# Patient Record
Sex: Male | Born: 1960 | Race: White | Hispanic: No | Marital: Single | Smoking: Former smoker
Health system: Southern US, Community
[De-identification: ages and names within clinical notes are randomized; demographics above are authoritative.]

## PROBLEM LIST (undated history)

## (undated) ENCOUNTER — Emergency Department (HOSPITAL_BASED_OUTPATIENT_CLINIC_OR_DEPARTMENT_OTHER): Admission: EM | Source: Home / Self Care

## (undated) DIAGNOSIS — B20 Human immunodeficiency virus [HIV] disease: Secondary | ICD-10-CM

## (undated) DIAGNOSIS — C8590 Non-Hodgkin lymphoma, unspecified, unspecified site: Secondary | ICD-10-CM

## (undated) DIAGNOSIS — F419 Anxiety disorder, unspecified: Secondary | ICD-10-CM

## (undated) DIAGNOSIS — R51 Headache: Secondary | ICD-10-CM

## (undated) DIAGNOSIS — F329 Major depressive disorder, single episode, unspecified: Secondary | ICD-10-CM

## (undated) DIAGNOSIS — Z21 Asymptomatic human immunodeficiency virus [HIV] infection status: Secondary | ICD-10-CM

## (undated) DIAGNOSIS — F32A Depression, unspecified: Secondary | ICD-10-CM

## (undated) DIAGNOSIS — L0390 Cellulitis, unspecified: Secondary | ICD-10-CM

## (undated) HISTORY — DX: Non-Hodgkin lymphoma, unspecified, unspecified site: C85.90

## (undated) HISTORY — DX: Anxiety disorder, unspecified: F41.9

## (undated) HISTORY — PX: ARM HARDWARE REMOVAL: SUR1122

## (undated) HISTORY — PX: HEMORRHOID SURGERY: SHX153

## (undated) HISTORY — PX: OTHER SURGICAL HISTORY: SHX169

---

## 1995-05-21 ENCOUNTER — Encounter (INDEPENDENT_AMBULATORY_CARE_PROVIDER_SITE_OTHER): Payer: Self-pay | Admitting: *Deleted

## 1995-05-21 LAB — CONVERTED CEMR LAB
CD4 Count: 120 microliters
CD4 T Cell Abs: 120

## 1995-07-03 ENCOUNTER — Encounter: Payer: Self-pay | Admitting: Internal Medicine

## 1999-04-13 ENCOUNTER — Encounter (INDEPENDENT_AMBULATORY_CARE_PROVIDER_SITE_OTHER): Payer: Self-pay | Admitting: *Deleted

## 1999-04-13 ENCOUNTER — Ambulatory Visit (HOSPITAL_BASED_OUTPATIENT_CLINIC_OR_DEPARTMENT_OTHER): Admission: RE | Admit: 1999-04-13 | Discharge: 1999-04-13 | Payer: Self-pay | Admitting: General Surgery

## 1999-05-10 ENCOUNTER — Ambulatory Visit (HOSPITAL_COMMUNITY): Admission: RE | Admit: 1999-05-10 | Discharge: 1999-05-10 | Payer: Self-pay | Admitting: Internal Medicine

## 1999-05-10 ENCOUNTER — Encounter: Admission: RE | Admit: 1999-05-10 | Discharge: 1999-05-10 | Payer: Self-pay | Admitting: Internal Medicine

## 1999-05-25 ENCOUNTER — Encounter: Admission: RE | Admit: 1999-05-25 | Discharge: 1999-05-25 | Payer: Self-pay | Admitting: Internal Medicine

## 2000-03-29 ENCOUNTER — Encounter: Admission: RE | Admit: 2000-03-29 | Discharge: 2000-03-29 | Payer: Self-pay | Admitting: Internal Medicine

## 2000-03-29 ENCOUNTER — Ambulatory Visit (HOSPITAL_COMMUNITY): Admission: RE | Admit: 2000-03-29 | Discharge: 2000-03-29 | Payer: Self-pay | Admitting: Internal Medicine

## 2000-04-12 ENCOUNTER — Encounter: Admission: RE | Admit: 2000-04-12 | Discharge: 2000-04-12 | Payer: Self-pay | Admitting: Internal Medicine

## 2000-04-14 ENCOUNTER — Encounter: Admission: RE | Admit: 2000-04-14 | Discharge: 2000-04-14 | Payer: Self-pay | Admitting: Internal Medicine

## 2000-04-17 ENCOUNTER — Encounter (HOSPITAL_COMMUNITY): Admission: RE | Admit: 2000-04-17 | Discharge: 2000-07-16 | Payer: Self-pay | Admitting: Dentistry

## 2000-04-18 ENCOUNTER — Encounter (HOSPITAL_COMMUNITY): Payer: Self-pay | Admitting: Dentistry

## 2000-06-21 ENCOUNTER — Ambulatory Visit (HOSPITAL_COMMUNITY): Admission: RE | Admit: 2000-06-21 | Discharge: 2000-06-21 | Payer: Self-pay | Admitting: Internal Medicine

## 2000-06-21 ENCOUNTER — Encounter: Admission: RE | Admit: 2000-06-21 | Discharge: 2000-06-21 | Payer: Self-pay | Admitting: Internal Medicine

## 2000-07-06 ENCOUNTER — Encounter: Admission: RE | Admit: 2000-07-06 | Discharge: 2000-07-06 | Payer: Self-pay | Admitting: Internal Medicine

## 2000-08-18 ENCOUNTER — Ambulatory Visit (HOSPITAL_COMMUNITY): Admission: RE | Admit: 2000-08-18 | Discharge: 2000-08-18 | Payer: Self-pay | Admitting: Internal Medicine

## 2000-08-18 ENCOUNTER — Encounter: Admission: RE | Admit: 2000-08-18 | Discharge: 2000-08-18 | Payer: Self-pay | Admitting: Internal Medicine

## 2000-09-04 ENCOUNTER — Encounter: Admission: RE | Admit: 2000-09-04 | Discharge: 2000-09-04 | Payer: Self-pay | Admitting: Internal Medicine

## 2000-11-07 ENCOUNTER — Encounter: Admission: RE | Admit: 2000-11-07 | Discharge: 2000-11-07 | Payer: Self-pay | Admitting: Internal Medicine

## 2000-11-07 ENCOUNTER — Ambulatory Visit (HOSPITAL_COMMUNITY): Admission: RE | Admit: 2000-11-07 | Discharge: 2000-11-07 | Payer: Self-pay | Admitting: Internal Medicine

## 2000-11-21 ENCOUNTER — Encounter: Admission: RE | Admit: 2000-11-21 | Discharge: 2000-11-21 | Payer: Self-pay | Admitting: Internal Medicine

## 2000-12-19 ENCOUNTER — Encounter: Admission: RE | Admit: 2000-12-19 | Discharge: 2000-12-19 | Payer: Self-pay | Admitting: Internal Medicine

## 2001-02-06 ENCOUNTER — Ambulatory Visit (HOSPITAL_COMMUNITY): Admission: RE | Admit: 2001-02-06 | Discharge: 2001-02-06 | Payer: Self-pay | Admitting: Internal Medicine

## 2001-02-06 ENCOUNTER — Encounter: Admission: RE | Admit: 2001-02-06 | Discharge: 2001-02-06 | Payer: Self-pay | Admitting: Internal Medicine

## 2001-02-20 ENCOUNTER — Encounter: Admission: RE | Admit: 2001-02-20 | Discharge: 2001-02-20 | Payer: Self-pay | Admitting: Internal Medicine

## 2001-04-03 ENCOUNTER — Ambulatory Visit (HOSPITAL_COMMUNITY): Admission: RE | Admit: 2001-04-03 | Discharge: 2001-04-03 | Payer: Self-pay | Admitting: Internal Medicine

## 2001-04-03 ENCOUNTER — Encounter: Admission: RE | Admit: 2001-04-03 | Discharge: 2001-04-03 | Payer: Self-pay | Admitting: Internal Medicine

## 2001-04-17 ENCOUNTER — Encounter: Admission: RE | Admit: 2001-04-17 | Discharge: 2001-04-17 | Payer: Self-pay | Admitting: Internal Medicine

## 2001-06-25 ENCOUNTER — Ambulatory Visit (HOSPITAL_COMMUNITY): Admission: RE | Admit: 2001-06-25 | Discharge: 2001-06-25 | Payer: Self-pay | Admitting: Internal Medicine

## 2001-06-25 ENCOUNTER — Encounter: Admission: RE | Admit: 2001-06-25 | Discharge: 2001-06-25 | Payer: Self-pay | Admitting: Internal Medicine

## 2001-07-09 ENCOUNTER — Encounter: Admission: RE | Admit: 2001-07-09 | Discharge: 2001-07-09 | Payer: Self-pay | Admitting: Internal Medicine

## 2001-10-02 ENCOUNTER — Ambulatory Visit: Admission: RE | Admit: 2001-10-02 | Discharge: 2001-10-02 | Payer: Self-pay | Admitting: Internal Medicine

## 2001-10-02 ENCOUNTER — Encounter: Admission: RE | Admit: 2001-10-02 | Discharge: 2001-10-02 | Payer: Self-pay | Admitting: Internal Medicine

## 2001-10-09 ENCOUNTER — Encounter: Admission: RE | Admit: 2001-10-09 | Discharge: 2001-10-09 | Payer: Self-pay | Admitting: Internal Medicine

## 2001-12-04 ENCOUNTER — Encounter: Admission: RE | Admit: 2001-12-04 | Discharge: 2001-12-04 | Payer: Self-pay | Admitting: Internal Medicine

## 2002-01-22 ENCOUNTER — Encounter: Admission: RE | Admit: 2002-01-22 | Discharge: 2002-01-22 | Payer: Self-pay | Admitting: Internal Medicine

## 2002-01-22 ENCOUNTER — Ambulatory Visit (HOSPITAL_COMMUNITY): Admission: RE | Admit: 2002-01-22 | Discharge: 2002-01-22 | Payer: Self-pay | Admitting: Internal Medicine

## 2002-02-05 ENCOUNTER — Encounter: Admission: RE | Admit: 2002-02-05 | Discharge: 2002-02-05 | Payer: Self-pay | Admitting: Internal Medicine

## 2002-03-11 ENCOUNTER — Encounter: Admission: RE | Admit: 2002-03-11 | Discharge: 2002-03-11 | Payer: Self-pay | Admitting: Internal Medicine

## 2002-03-28 ENCOUNTER — Encounter: Admission: RE | Admit: 2002-03-28 | Discharge: 2002-03-28 | Payer: Self-pay | Admitting: Internal Medicine

## 2002-04-09 ENCOUNTER — Encounter: Admission: RE | Admit: 2002-04-09 | Discharge: 2002-04-09 | Payer: Self-pay | Admitting: Internal Medicine

## 2002-05-28 ENCOUNTER — Encounter: Admission: RE | Admit: 2002-05-28 | Discharge: 2002-05-28 | Payer: Self-pay | Admitting: Internal Medicine

## 2002-07-09 ENCOUNTER — Ambulatory Visit (HOSPITAL_COMMUNITY): Admission: RE | Admit: 2002-07-09 | Discharge: 2002-07-09 | Payer: Self-pay | Admitting: Internal Medicine

## 2002-07-09 ENCOUNTER — Encounter: Admission: RE | Admit: 2002-07-09 | Discharge: 2002-07-09 | Payer: Self-pay | Admitting: Internal Medicine

## 2002-08-13 ENCOUNTER — Encounter: Admission: RE | Admit: 2002-08-13 | Discharge: 2002-08-13 | Payer: Self-pay | Admitting: Internal Medicine

## 2002-09-03 ENCOUNTER — Encounter: Admission: RE | Admit: 2002-09-03 | Discharge: 2002-09-03 | Payer: Self-pay | Admitting: Internal Medicine

## 2002-11-18 ENCOUNTER — Encounter: Payer: Self-pay | Admitting: Internal Medicine

## 2002-11-18 ENCOUNTER — Encounter: Admission: RE | Admit: 2002-11-18 | Discharge: 2002-11-18 | Payer: Self-pay | Admitting: Internal Medicine

## 2002-12-03 ENCOUNTER — Encounter: Admission: RE | Admit: 2002-12-03 | Discharge: 2002-12-03 | Payer: Self-pay | Admitting: Internal Medicine

## 2003-02-18 ENCOUNTER — Ambulatory Visit (HOSPITAL_COMMUNITY): Admission: RE | Admit: 2003-02-18 | Discharge: 2003-02-18 | Payer: Self-pay | Admitting: Internal Medicine

## 2003-02-18 ENCOUNTER — Encounter: Payer: Self-pay | Admitting: Internal Medicine

## 2003-02-18 ENCOUNTER — Encounter: Admission: RE | Admit: 2003-02-18 | Discharge: 2003-02-18 | Payer: Self-pay | Admitting: Internal Medicine

## 2003-03-06 ENCOUNTER — Encounter: Admission: RE | Admit: 2003-03-06 | Discharge: 2003-03-06 | Payer: Self-pay | Admitting: Internal Medicine

## 2003-04-08 ENCOUNTER — Encounter: Admission: RE | Admit: 2003-04-08 | Discharge: 2003-04-08 | Payer: Self-pay | Admitting: Internal Medicine

## 2003-05-27 ENCOUNTER — Encounter: Payer: Self-pay | Admitting: Internal Medicine

## 2003-05-27 ENCOUNTER — Ambulatory Visit (HOSPITAL_COMMUNITY): Admission: RE | Admit: 2003-05-27 | Discharge: 2003-05-27 | Payer: Self-pay | Admitting: Internal Medicine

## 2003-05-27 ENCOUNTER — Encounter: Admission: RE | Admit: 2003-05-27 | Discharge: 2003-05-27 | Payer: Self-pay | Admitting: Internal Medicine

## 2003-06-10 ENCOUNTER — Encounter: Admission: RE | Admit: 2003-06-10 | Discharge: 2003-06-10 | Payer: Self-pay | Admitting: Internal Medicine

## 2003-08-26 ENCOUNTER — Ambulatory Visit (HOSPITAL_COMMUNITY): Admission: RE | Admit: 2003-08-26 | Discharge: 2003-08-26 | Payer: Self-pay | Admitting: Internal Medicine

## 2003-08-26 ENCOUNTER — Encounter: Admission: RE | Admit: 2003-08-26 | Discharge: 2003-08-26 | Payer: Self-pay | Admitting: Internal Medicine

## 2003-09-09 ENCOUNTER — Encounter: Admission: RE | Admit: 2003-09-09 | Discharge: 2003-09-09 | Payer: Self-pay | Admitting: Internal Medicine

## 2003-12-25 ENCOUNTER — Encounter: Admission: RE | Admit: 2003-12-25 | Discharge: 2003-12-25 | Payer: Self-pay | Admitting: Internal Medicine

## 2004-02-05 ENCOUNTER — Encounter: Admission: RE | Admit: 2004-02-05 | Discharge: 2004-02-05 | Payer: Self-pay | Admitting: Internal Medicine

## 2004-02-05 ENCOUNTER — Ambulatory Visit (HOSPITAL_COMMUNITY): Admission: RE | Admit: 2004-02-05 | Discharge: 2004-02-05 | Payer: Self-pay | Admitting: Internal Medicine

## 2004-02-19 ENCOUNTER — Ambulatory Visit: Payer: Self-pay | Admitting: Internal Medicine

## 2004-03-16 ENCOUNTER — Ambulatory Visit: Payer: Self-pay | Admitting: Internal Medicine

## 2004-05-04 ENCOUNTER — Ambulatory Visit (HOSPITAL_COMMUNITY): Admission: RE | Admit: 2004-05-04 | Discharge: 2004-05-04 | Payer: Self-pay | Admitting: Internal Medicine

## 2004-05-04 ENCOUNTER — Ambulatory Visit: Payer: Self-pay | Admitting: Internal Medicine

## 2004-05-18 ENCOUNTER — Ambulatory Visit: Payer: Self-pay | Admitting: Internal Medicine

## 2004-07-26 ENCOUNTER — Ambulatory Visit: Payer: Self-pay | Admitting: Internal Medicine

## 2004-07-26 ENCOUNTER — Ambulatory Visit (HOSPITAL_COMMUNITY): Admission: RE | Admit: 2004-07-26 | Discharge: 2004-07-26 | Payer: Self-pay | Admitting: Internal Medicine

## 2004-08-10 ENCOUNTER — Ambulatory Visit: Payer: Self-pay | Admitting: Internal Medicine

## 2004-08-26 ENCOUNTER — Ambulatory Visit: Payer: Self-pay | Admitting: Internal Medicine

## 2004-11-04 ENCOUNTER — Ambulatory Visit (HOSPITAL_COMMUNITY): Admission: RE | Admit: 2004-11-04 | Discharge: 2004-11-04 | Payer: Self-pay | Admitting: Internal Medicine

## 2004-11-04 ENCOUNTER — Ambulatory Visit: Payer: Self-pay | Admitting: Internal Medicine

## 2004-11-25 ENCOUNTER — Ambulatory Visit: Payer: Self-pay | Admitting: Internal Medicine

## 2005-02-09 ENCOUNTER — Ambulatory Visit: Payer: Self-pay | Admitting: Internal Medicine

## 2005-02-09 ENCOUNTER — Ambulatory Visit (HOSPITAL_COMMUNITY): Admission: RE | Admit: 2005-02-09 | Discharge: 2005-02-09 | Payer: Self-pay | Admitting: Internal Medicine

## 2005-02-09 ENCOUNTER — Encounter (INDEPENDENT_AMBULATORY_CARE_PROVIDER_SITE_OTHER): Payer: Self-pay | Admitting: *Deleted

## 2005-02-09 LAB — CONVERTED CEMR LAB
CD4 Count: 430 microliters
HIV 1 RNA Quant: 399 copies/mL

## 2005-03-01 ENCOUNTER — Ambulatory Visit: Payer: Self-pay | Admitting: Internal Medicine

## 2005-05-10 ENCOUNTER — Ambulatory Visit: Payer: Self-pay | Admitting: Internal Medicine

## 2005-09-06 ENCOUNTER — Ambulatory Visit: Payer: Self-pay | Admitting: Internal Medicine

## 2005-09-06 ENCOUNTER — Encounter: Admission: RE | Admit: 2005-09-06 | Discharge: 2005-09-06 | Payer: Self-pay | Admitting: Internal Medicine

## 2005-09-06 ENCOUNTER — Encounter (INDEPENDENT_AMBULATORY_CARE_PROVIDER_SITE_OTHER): Payer: Self-pay | Admitting: *Deleted

## 2005-09-06 LAB — CONVERTED CEMR LAB
CD4 Count: 300 microliters
HIV 1 RNA Quant: 399 copies/mL

## 2005-09-20 ENCOUNTER — Ambulatory Visit: Payer: Self-pay | Admitting: Internal Medicine

## 2006-03-08 ENCOUNTER — Encounter (INDEPENDENT_AMBULATORY_CARE_PROVIDER_SITE_OTHER): Payer: Self-pay | Admitting: *Deleted

## 2006-03-08 ENCOUNTER — Encounter: Admission: RE | Admit: 2006-03-08 | Discharge: 2006-03-08 | Payer: Self-pay | Admitting: Internal Medicine

## 2006-03-08 ENCOUNTER — Ambulatory Visit: Payer: Self-pay | Admitting: Internal Medicine

## 2006-03-08 LAB — CONVERTED CEMR LAB
CD4 Count: 430 microliters
HIV 1 RNA Quant: 140 copies/mL

## 2006-03-28 ENCOUNTER — Ambulatory Visit: Payer: Self-pay | Admitting: Internal Medicine

## 2006-04-06 DIAGNOSIS — K602 Anal fissure, unspecified: Secondary | ICD-10-CM

## 2006-04-06 DIAGNOSIS — F411 Generalized anxiety disorder: Secondary | ICD-10-CM | POA: Insufficient documentation

## 2006-04-06 DIAGNOSIS — B3781 Candidal esophagitis: Secondary | ICD-10-CM | POA: Insufficient documentation

## 2006-04-06 DIAGNOSIS — B1920 Unspecified viral hepatitis C without hepatic coma: Secondary | ICD-10-CM

## 2006-04-06 DIAGNOSIS — B20 Human immunodeficiency virus [HIV] disease: Secondary | ICD-10-CM | POA: Insufficient documentation

## 2006-04-06 DIAGNOSIS — F329 Major depressive disorder, single episode, unspecified: Secondary | ICD-10-CM

## 2006-04-06 DIAGNOSIS — F1910 Other psychoactive substance abuse, uncomplicated: Secondary | ICD-10-CM

## 2006-04-06 DIAGNOSIS — K029 Dental caries, unspecified: Secondary | ICD-10-CM | POA: Insufficient documentation

## 2006-08-14 ENCOUNTER — Encounter (INDEPENDENT_AMBULATORY_CARE_PROVIDER_SITE_OTHER): Payer: Self-pay | Admitting: *Deleted

## 2006-08-14 LAB — CONVERTED CEMR LAB

## 2006-08-27 ENCOUNTER — Encounter (INDEPENDENT_AMBULATORY_CARE_PROVIDER_SITE_OTHER): Payer: Self-pay | Admitting: *Deleted

## 2006-09-25 ENCOUNTER — Telehealth: Payer: Self-pay | Admitting: Internal Medicine

## 2006-10-17 ENCOUNTER — Telehealth: Payer: Self-pay | Admitting: Internal Medicine

## 2006-11-30 ENCOUNTER — Telehealth: Payer: Self-pay | Admitting: Internal Medicine

## 2006-12-11 ENCOUNTER — Telehealth: Payer: Self-pay | Admitting: Internal Medicine

## 2006-12-19 ENCOUNTER — Ambulatory Visit: Payer: Self-pay | Admitting: Internal Medicine

## 2006-12-19 ENCOUNTER — Encounter: Admission: RE | Admit: 2006-12-19 | Discharge: 2006-12-19 | Payer: Self-pay | Admitting: Internal Medicine

## 2006-12-19 LAB — CONVERTED CEMR LAB
ALT: 12 units/L (ref 0–53)
AST: 15 units/L (ref 0–37)
Albumin: 4.4 g/dL (ref 3.5–5.2)
Alkaline Phosphatase: 82 units/L (ref 39–117)
BUN: 21 mg/dL (ref 6–23)
Basophils Absolute: 0 10*3/uL (ref 0.0–0.1)
Basophils Relative: 0 % (ref 0–1)
CO2: 26 meq/L (ref 19–32)
Calcium: 9.1 mg/dL (ref 8.4–10.5)
Chloride: 106 meq/L (ref 96–112)
Cholesterol: 170 mg/dL (ref 0–200)
Creatinine, Ser: 0.93 mg/dL (ref 0.40–1.50)
Eosinophils Absolute: 0.1 10*3/uL (ref 0.0–0.7)
Eosinophils Relative: 1 % (ref 0–5)
Glucose, Bld: 95 mg/dL (ref 70–99)
HCT: 43.9 % (ref 39.0–52.0)
HDL: 30 mg/dL — ABNORMAL LOW (ref >39)
HIV 1 RNA Quant: 134 copies/mL — ABNORMAL HIGH (ref <50)
HIV-1 RNA Quant, Log: 2.13 — ABNORMAL HIGH (ref <1.70)
Hemoglobin: 14.9 g/dL (ref 13.0–17.0)
LDL Cholesterol: 105 mg/dL — ABNORMAL HIGH (ref 0–99)
Lymphocytes Relative: 23 % (ref 12–46)
Lymphs Abs: 1.6 10*3/uL (ref 0.7–3.3)
MCHC: 33.9 g/dL (ref 30.0–36.0)
MCV: 117.1 fL — ABNORMAL HIGH (ref 78.0–100.0)
Monocytes Absolute: 0.6 10*3/uL (ref 0.2–0.7)
Monocytes Relative: 9 % (ref 3–11)
Neutro Abs: 4.6 10*3/uL (ref 1.7–7.7)
Neutrophils Relative %: 67 % (ref 43–77)
Platelets: 173 10*3/uL (ref 150–400)
Potassium: 4.3 meq/L (ref 3.5–5.3)
RBC: 3.75 M/uL — ABNORMAL LOW (ref 4.22–5.81)
RDW: 14.2 % — ABNORMAL HIGH (ref 11.5–14.0)
Sodium: 138 meq/L (ref 135–145)
Total Bilirubin: 0.6 mg/dL (ref 0.3–1.2)
Total CHOL/HDL Ratio: 5.7
Total Protein: 7.2 g/dL (ref 6.0–8.3)
Triglycerides: 173 mg/dL — ABNORMAL HIGH (ref <150)
VLDL: 35 mg/dL (ref 0–40)
WBC: 6.8 10*3/uL (ref 4.0–10.5)

## 2006-12-20 ENCOUNTER — Telehealth: Payer: Self-pay | Admitting: Internal Medicine

## 2006-12-20 ENCOUNTER — Encounter: Payer: Self-pay | Admitting: Internal Medicine

## 2006-12-20 LAB — CONVERTED CEMR LAB: CD4 Count: 330 microliters

## 2007-01-02 ENCOUNTER — Ambulatory Visit: Payer: Self-pay | Admitting: Internal Medicine

## 2007-01-04 ENCOUNTER — Encounter: Payer: Self-pay | Admitting: Internal Medicine

## 2007-01-04 ENCOUNTER — Telehealth: Payer: Self-pay | Admitting: Internal Medicine

## 2007-06-07 ENCOUNTER — Telehealth: Payer: Self-pay | Admitting: Internal Medicine

## 2007-06-08 ENCOUNTER — Ambulatory Visit: Payer: Self-pay | Admitting: Internal Medicine

## 2007-06-08 ENCOUNTER — Encounter: Admission: RE | Admit: 2007-06-08 | Discharge: 2007-06-08 | Payer: Self-pay | Admitting: Internal Medicine

## 2007-06-08 LAB — CONVERTED CEMR LAB
HIV 1 RNA Quant: 74 copies/mL — ABNORMAL HIGH (ref <50)
HIV-1 RNA Quant, Log: 1.87 — ABNORMAL HIGH (ref <1.70)

## 2007-07-02 ENCOUNTER — Telehealth: Payer: Self-pay | Admitting: Internal Medicine

## 2007-07-10 ENCOUNTER — Ambulatory Visit: Payer: Self-pay | Admitting: Internal Medicine

## 2007-08-07 ENCOUNTER — Telehealth: Payer: Self-pay | Admitting: Infectious Diseases

## 2007-09-05 ENCOUNTER — Telehealth: Payer: Self-pay | Admitting: Internal Medicine

## 2007-10-18 ENCOUNTER — Telehealth: Payer: Self-pay | Admitting: Internal Medicine

## 2007-11-05 ENCOUNTER — Encounter: Payer: Self-pay | Admitting: Internal Medicine

## 2007-12-27 ENCOUNTER — Encounter: Admission: RE | Admit: 2007-12-27 | Discharge: 2007-12-27 | Payer: Self-pay | Admitting: Internal Medicine

## 2007-12-27 ENCOUNTER — Ambulatory Visit: Payer: Self-pay | Admitting: Internal Medicine

## 2007-12-27 LAB — CONVERTED CEMR LAB: HIV 1 RNA Quant: 107 copies/mL — ABNORMAL HIGH (ref <50)

## 2008-01-15 ENCOUNTER — Ambulatory Visit: Payer: Self-pay | Admitting: Internal Medicine

## 2008-01-15 LAB — CONVERTED CEMR LAB
LDL Cholesterol: 132 mg/dL — ABNORMAL HIGH (ref 0–99)
Triglycerides: 228 mg/dL — ABNORMAL HIGH (ref <150)

## 2008-03-14 ENCOUNTER — Encounter (INDEPENDENT_AMBULATORY_CARE_PROVIDER_SITE_OTHER): Payer: Self-pay | Admitting: *Deleted

## 2008-07-10 ENCOUNTER — Ambulatory Visit: Payer: Self-pay | Admitting: Internal Medicine

## 2008-07-10 LAB — CONVERTED CEMR LAB
ALT: 16 units/L (ref 0–53)
Albumin: 4.4 g/dL (ref 3.5–5.2)
BUN: 19 mg/dL (ref 6–23)
CO2: 22 meq/L (ref 19–32)
Calcium: 9.2 mg/dL (ref 8.4–10.5)
Chloride: 107 meq/L (ref 96–112)
Cholesterol: 190 mg/dL (ref 0–200)
Creatinine, Ser: 0.87 mg/dL (ref 0.40–1.50)
HDL: 33 mg/dL — ABNORMAL LOW (ref >39)
HIV 1 RNA Quant: 98 copies/mL — ABNORMAL HIGH (ref <48)
Hemoglobin: 15.9 g/dL (ref 13.0–17.0)
Lymphocytes Relative: 37 % (ref 12–46)
Lymphs Abs: 2.2 10*3/uL (ref 0.7–4.0)
Monocytes Absolute: 0.5 10*3/uL (ref 0.1–1.0)
Monocytes Relative: 8 % (ref 3–12)
Neutro Abs: 3.2 10*3/uL (ref 1.7–7.7)
RBC: 4.42 M/uL (ref 4.22–5.81)
Total CHOL/HDL Ratio: 5.8
WBC: 6 10*3/uL (ref 4.0–10.5)

## 2008-07-29 ENCOUNTER — Ambulatory Visit: Payer: Self-pay | Admitting: Internal Medicine

## 2008-07-29 DIAGNOSIS — Z87891 Personal history of nicotine dependence: Secondary | ICD-10-CM | POA: Insufficient documentation

## 2008-08-11 ENCOUNTER — Telehealth: Payer: Self-pay | Admitting: Internal Medicine

## 2009-03-10 ENCOUNTER — Ambulatory Visit: Payer: Self-pay | Admitting: Infectious Disease

## 2009-03-10 ENCOUNTER — Encounter: Payer: Self-pay | Admitting: Internal Medicine

## 2009-03-10 LAB — CONVERTED CEMR LAB
AST: 25 units/L (ref 0–37)
Albumin: 4.2 g/dL (ref 3.5–5.2)
BUN: 14 mg/dL (ref 6–23)
Basophils Absolute: 0 10*3/uL (ref 0.0–0.1)
Basophils Relative: 0 % (ref 0–1)
Calcium: 9.1 mg/dL (ref 8.4–10.5)
Chloride: 105 meq/L (ref 96–112)
HIV 1 RNA Quant: 127000 copies/mL — ABNORMAL HIGH (ref <48)
HIV-1 RNA Quant, Log: 5.1 — ABNORMAL HIGH (ref <1.68)
Hemoglobin: 15.6 g/dL (ref 13.0–17.0)
Lymphocytes Relative: 40 % (ref 12–46)
MCHC: 34.6 g/dL (ref 30.0–36.0)
Monocytes Absolute: 0.5 10*3/uL (ref 0.1–1.0)
Neutro Abs: 2.5 10*3/uL (ref 1.7–7.7)
Neutrophils Relative %: 49 % (ref 43–77)
Platelets: 141 10*3/uL — ABNORMAL LOW (ref 150–400)
Potassium: 4.9 meq/L (ref 3.5–5.3)
RDW: 13 % (ref 11.5–15.5)
Sodium: 139 meq/L (ref 135–145)
Total Protein: 7.7 g/dL (ref 6.0–8.3)

## 2009-03-16 ENCOUNTER — Encounter: Payer: Self-pay | Admitting: Internal Medicine

## 2009-03-23 ENCOUNTER — Ambulatory Visit: Payer: Self-pay | Admitting: Internal Medicine

## 2009-03-24 ENCOUNTER — Encounter: Payer: Self-pay | Admitting: Internal Medicine

## 2009-04-02 ENCOUNTER — Telehealth (INDEPENDENT_AMBULATORY_CARE_PROVIDER_SITE_OTHER): Payer: Self-pay | Admitting: *Deleted

## 2009-04-15 ENCOUNTER — Telehealth (INDEPENDENT_AMBULATORY_CARE_PROVIDER_SITE_OTHER): Payer: Self-pay | Admitting: *Deleted

## 2009-05-21 ENCOUNTER — Telehealth: Payer: Self-pay | Admitting: Internal Medicine

## 2009-06-17 ENCOUNTER — Telehealth (INDEPENDENT_AMBULATORY_CARE_PROVIDER_SITE_OTHER): Payer: Self-pay | Admitting: *Deleted

## 2009-06-24 ENCOUNTER — Telehealth: Payer: Self-pay | Admitting: Internal Medicine

## 2009-07-14 ENCOUNTER — Ambulatory Visit: Payer: Self-pay | Admitting: Internal Medicine

## 2009-07-14 LAB — CONVERTED CEMR LAB
AST: 25 units/L (ref 0–37)
Albumin: 4.2 g/dL (ref 3.5–5.2)
Alkaline Phosphatase: 82 units/L (ref 39–117)
Basophils Absolute: 0 10*3/uL (ref 0.0–0.1)
Basophils Relative: 0 % (ref 0–1)
Glucose, Bld: 77 mg/dL (ref 70–99)
HDL: 28 mg/dL — ABNORMAL LOW (ref >39)
LDL Cholesterol: 76 mg/dL (ref 0–99)
Lymphocytes Relative: 35 % (ref 12–46)
MCHC: 34.6 g/dL (ref 30.0–36.0)
Monocytes Absolute: 0.6 10*3/uL (ref 0.1–1.0)
Neutro Abs: 2.4 10*3/uL (ref 1.7–7.7)
Neutrophils Relative %: 51 % (ref 43–77)
Platelets: 120 10*3/uL — ABNORMAL LOW (ref 150–400)
Potassium: 4.1 meq/L (ref 3.5–5.3)
RDW: 13.8 % (ref 11.5–15.5)
Sodium: 139 meq/L (ref 135–145)
Total Bilirubin: 0.4 mg/dL (ref 0.3–1.2)
Total Protein: 7.6 g/dL (ref 6.0–8.3)
Triglycerides: 225 mg/dL — ABNORMAL HIGH (ref <150)
VLDL: 45 mg/dL — ABNORMAL HIGH (ref 0–40)

## 2009-07-28 ENCOUNTER — Telehealth (INDEPENDENT_AMBULATORY_CARE_PROVIDER_SITE_OTHER): Payer: Self-pay | Admitting: Licensed Clinical Social Worker

## 2009-07-28 ENCOUNTER — Ambulatory Visit: Payer: Self-pay | Admitting: Internal Medicine

## 2009-07-28 DIAGNOSIS — S2239XA Fracture of one rib, unspecified side, initial encounter for closed fracture: Secondary | ICD-10-CM | POA: Insufficient documentation

## 2009-07-28 DIAGNOSIS — J069 Acute upper respiratory infection, unspecified: Secondary | ICD-10-CM | POA: Insufficient documentation

## 2009-09-29 ENCOUNTER — Encounter: Payer: Self-pay | Admitting: Internal Medicine

## 2009-11-17 ENCOUNTER — Telehealth: Payer: Self-pay | Admitting: Internal Medicine

## 2009-12-15 ENCOUNTER — Ambulatory Visit: Payer: Self-pay | Admitting: Internal Medicine

## 2009-12-15 LAB — CONVERTED CEMR LAB
Albumin: 4.1 g/dL (ref 3.5–5.2)
Alkaline Phosphatase: 62 units/L (ref 39–117)
BUN: 16 mg/dL (ref 6–23)
Basophils Absolute: 0 10*3/uL (ref 0.0–0.1)
Basophils Relative: 1 % (ref 0–1)
Creatinine, Ser: 0.9 mg/dL (ref 0.40–1.50)
Eosinophils Absolute: 0.1 10*3/uL (ref 0.0–0.7)
Eosinophils Relative: 2 % (ref 0–5)
Glucose, Bld: 80 mg/dL (ref 70–99)
HCT: 43.3 % (ref 39.0–52.0)
HDL: 25 mg/dL — ABNORMAL LOW (ref >39)
Hemoglobin: 15.4 g/dL (ref 13.0–17.0)
LDL Cholesterol: 93 mg/dL (ref 0–99)
MCHC: 35.6 g/dL (ref 30.0–36.0)
Monocytes Absolute: 0.4 10*3/uL (ref 0.1–1.0)
Platelets: 127 10*3/uL — ABNORMAL LOW (ref 150–400)
RDW: 13.3 % (ref 11.5–15.5)
Total Bilirubin: 0.4 mg/dL (ref 0.3–1.2)
Total CHOL/HDL Ratio: 6.2
Triglycerides: 183 mg/dL — ABNORMAL HIGH (ref <150)
VLDL: 37 mg/dL (ref 0–40)

## 2009-12-24 ENCOUNTER — Telehealth: Payer: Self-pay | Admitting: Internal Medicine

## 2009-12-30 ENCOUNTER — Telehealth (INDEPENDENT_AMBULATORY_CARE_PROVIDER_SITE_OTHER): Payer: Self-pay | Admitting: *Deleted

## 2009-12-31 ENCOUNTER — Ambulatory Visit: Payer: Self-pay | Admitting: Internal Medicine

## 2010-02-05 ENCOUNTER — Telehealth: Payer: Self-pay | Admitting: Internal Medicine

## 2010-02-10 ENCOUNTER — Encounter: Payer: Self-pay | Admitting: Internal Medicine

## 2010-03-03 ENCOUNTER — Ambulatory Visit: Payer: Self-pay | Admitting: Internal Medicine

## 2010-03-03 LAB — CONVERTED CEMR LAB: HIV-1 RNA Quant, Log: 2.74 — ABNORMAL HIGH (ref <1.30)

## 2010-03-16 ENCOUNTER — Ambulatory Visit: Payer: Self-pay | Admitting: Internal Medicine

## 2010-04-15 ENCOUNTER — Encounter: Payer: Self-pay | Admitting: Internal Medicine

## 2010-04-26 ENCOUNTER — Encounter: Payer: Self-pay | Admitting: Internal Medicine

## 2010-05-18 ENCOUNTER — Encounter (INDEPENDENT_AMBULATORY_CARE_PROVIDER_SITE_OTHER): Payer: Self-pay | Admitting: *Deleted

## 2010-06-23 ENCOUNTER — Ambulatory Visit: Admit: 2010-06-23 | Payer: Self-pay | Admitting: Internal Medicine

## 2010-07-06 ENCOUNTER — Ambulatory Visit: Admit: 2010-07-06 | Payer: Self-pay | Admitting: Internal Medicine

## 2010-07-12 ENCOUNTER — Encounter (INDEPENDENT_AMBULATORY_CARE_PROVIDER_SITE_OTHER): Payer: Self-pay | Admitting: *Deleted

## 2010-07-22 ENCOUNTER — Encounter: Payer: Self-pay | Admitting: Internal Medicine

## 2010-07-22 NOTE — Assessment & Plan Note (Signed)
Summary: 29month f/u + flu vaccine   CC:  follow-up visit.  History of Present Illness: Jordan Calderon is in for his routine visit. He has all of his medications and his pill boxes.  He has been working with Bjorn Loser, our treatment adherence counselor, and says that he is doing much better taking his medications.  He can recall only two missed doses since his last visit.  He is feeling much better both mentally and physically.  Preventive Screening-Counseling & Management  Alcohol-Tobacco     Alcohol drinks/day: occassionally     Alcohol type: vodka     Smoking Status: current     Smoking Cessation Counseling: 03/28/2006     Smoke Cessation Stage: contemplative     Packs/Day: 0.75     Year Quit: 06/2007     Pack years: 1, started 30 years ago     Passive Smoke Exposure: no  Caffeine-Diet-Exercise     Caffeine use/day: yes     Does Patient Exercise: no     Type of exercise: lifting weights     Exercise (avg: min/session): 30-60     Times/week: 3  Hep-HIV-STD-Contraception     HIV Risk: no     HIV Risk Counseling: not indicated-no HIV risk noted  Safety-Violence-Falls     Seat Belt Use: yes  Comments: declined condoms      Sexual History:  n/a.        Drug Use:  never.     Prior Medication List:  COMBIVIR 150-300 MG TABS (LAMIVUDINE-ZIDOVUDINE) Take 1 tablet by mouth two times a day VIREAD 300 MG TABS (TENOFOVIR DISOPROXIL FUMARATE) Take 1 tablet by mouth once a day REYATAZ 300 MG CAPS (ATAZANAVIR SULFATE) Take 1 capsule by mouth once a day NORVIR 100 MG TABS (RITONAVIR) Take 1 tablet by mouth once a day PAXIL 10 MG TABS (PAROXETINE HCL) Take 1 tablet by mouth once a day ATIVAN 2 MG TABS (LORAZEPAM) Take 1-2 tablets by mouth at bedtime as needed for anxiety NAPROSYN 250 MG TABS (NAPROXEN) Take 1 tablet by mouth two times a day as needed for pain DAPSONE 100 MG TABS (DAPSONE) Take 1 tablet by mouth once a day   Current Allergies (reviewed today): ! SULFA Social History: Sexual  History:  n/a Drug Use:  never  Vital Signs:  Patient profile:   50 year old male Height:      68 inches (172.72 cm) Weight:      165.5 pounds (75.23 kg) BMI:     25.26 Temp:     98.2 degrees F (36.78 degrees C) oral Pulse rate:   86 / minute BP sitting:   149 / 92  (left arm) Cuff size:   regular  Vitals Entered By: Jennet Maduro RN (March 16, 2010 10:03 AM) CC: follow-up visit Is Patient Diabetic? No Pain Assessment Patient in pain? no      Nutritional Status BMI of 19 -24 = normal Nutritional Status Detail appetite "good"  Have you ever been in a relationship where you felt threatened, hurt or afraid?No   Does patient need assistance? Functional Status Self care Ambulation Normal Comments missed one dose of rxes   Physical Exam  General:  alert and well-nourished.   Mouth:  good dentition, pharynx pink and moist, no erythema, and no exudates.   Lungs:  normal breath sounds.  no crackles and no wheezes.  mild diffuse chest wall pain in the left axillary line. Heart:  normal rate, regular rhythm, and no murmur.  Skin:  no rashes.   Axillary Nodes:  no R axillary adenopathy and no L axillary adenopathy.   Psych:  normally interactive, good eye contact, not anxious appearing, and not depressed appearing.  he is in good spirits.        Medication Adherence: 03/16/2010   Adherence to medications reviewed with patient. Counseling to provide adequate adherence provided                                Impression & Recommendations:  Problem # 1:  HIV DISEASE (ICD-042) His adherence has improved dramatically and as a result his infection is coming back under control.  I will continue his current regimen. Diagnostics Reviewed:  HIV: CDC-defined AIDS (07/29/2008)   CD4: 260 (03/04/2010)   WBC: 4.0 (12/15/2009)   Hgb: 15.4 (12/15/2009)   HCT: 43.3 (12/15/2009)   Platelets: 127 (12/15/2009) HIV genotype: * (03/16/2009)   HIV-1 RNA: 546 (03/03/2010)   HBSAg:  No (08/14/2006)  Problem # 2:  ANXIETY (ICD-300.00) His anxiety has improved.  I will continue Ativan at bedtime. His updated medication list for this problem includes:    Paxil 10 Mg Tabs (Paroxetine hcl) .Marland Kitchen... Take 1 tablet by mouth once a day    Ativan 2 Mg Tabs (Lorazepam) .Marland Kitchen... Take 1-2 tablets by mouth at bedtime as needed for anxiety  Orders: Est. Patient Level IV (81191)  Problem # 3:  DEPRESSION (ICD-311) His depression is improving. His updated medication list for this problem includes:    Paxil 10 Mg Tabs (Paroxetine hcl) .Marland Kitchen... Take 1 tablet by mouth once a day    Ativan 2 Mg Tabs (Lorazepam) .Marland Kitchen... Take 1-2 tablets by mouth at bedtime as needed for anxiety  Orders: Est. Patient Level IV (47829)  Other Orders: Influenza Vaccine MCR (56213) Future Orders: T-CD4SP ([facility]) (CD4SP) ... 06/14/2010 T-HIV Viral Load [id number]) ... 06/14/2010 T-Comprehensive Metabolic Panel [phone]) ... 06/14/2010 T-CBC w/Diff ([id number]) ... 06/14/2010 T-RPR (Syphilis) [phone]) ... 06/14/2010 T-Lipid Profile [phone]) ... 06/14/2010  Patient Instructions: 1)  Please schedule a follow-up appointment in 3 months.  Prescriptions: ATIVAN 2 MG TABS (LORAZEPAM) Take 1-2 tablets by mouth at bedtime as needed for anxiety  #60 x 2   Entered and Authorized by:   [name] MD   Signed by:   [name] MD on 03/16/2010   Method used:   Print then Give to Patient   RxID:   [id number] ATIVAN 2 MG TABS (LORAZEPAM) Take 1-2 tablets by mouth at bedtime as needed for anxiety  #60 x 2   Entered and Authorized by:   [name] MD   Signed by:   [name] MD on 03/16/2010   Method used:   Print then Give to Patient   RxID:   [id number]    Influenza Vaccine    Vaccine Type: Fluvax MCR    Site: right deltoid    Mfr: novartis    Dose: 0.5 ml    Route: IM    Given by: Jennet Maduro RN    Exp. Date: 09/19/2010    Lot #: [id number]  Flu Vaccine Consent  Questions    Do you have a history of severe allergic reactions to this vaccine? no    Any prior history of allergic reactions to egg and/or gelatin? no    Do you have a sensitivity to the preservative Thimersol? no    Do you have a past history of  Guillan-Barre Syndrome? no    Do you currently have an acute febrile illness? no    Have you ever had a severe reaction to latex? no    Vaccine information given and explained to patient? yes   Process Orders Check Orders Results:     [facility]: Order checked:     [id number] -- T-Lipid Profile -- ABN required due to diagnosis (CPT: 80061) Tests Sent for requisitioning (March 16, 2010 10:32 AM):     06/14/2010: [facility] -- T-HIV Viral Load [phone] (signed)     06/14/2010: [facility] -- T-Comprehensive Metabolic Panel [[id number]] (signed)     06/14/2010: [facility] -- T-CBC w/Diff [[id number]] (signed)     06/14/2010: [facility] -- T-RPR (Syphilis) [phone] (signed)     06/14/2010: [facility] -- T-Lipid Profile [phone] (signed)

## 2010-07-22 NOTE — Progress Notes (Signed)
Summary: Ativan RFs #30 on 6/2, 6/11, & 6/24 per [facility], paid cash  Phone Note Call from Patient Call back at cell [phone]   Caller: Patient Reason for Call: Refill Medication Summary of Call: Pt. requesting that refill for 2 pills of Ativan be called into the [facility] on [address].  He stated that he has been out since Sunday, July 10th.  RN reveiwed medication record, found that a refill of Ativan was called to [facility] on [address] on November 19, 2009 with 2 addl refills.  RN called [facility].  Pharmacist stated the the pt. picked up 30 tablets on 11/19/09, 11/28/09 and 12/11/09 and paid cash each time for the prescriptions.  The pharmacist stated that the pharmacy should have been paying closer attention to the refills even though the pt. paid in cash each time.  Pt. to discuss refill request w/ Dr. Orvan Falconer at July 14th appt. Jennet Maduro RN  December 30, 2009 4:11 PM

## 2010-07-22 NOTE — Progress Notes (Signed)
Summary: phone note-TY  Phone Note Outgoing Call   Summary of Call: I mailed patient his rx and his last labs to his home address, he forgot it at his appt today. Initial call taken by: Starleen Arms CMA,  July 28, 2009 11:59 AM

## 2010-07-22 NOTE — Progress Notes (Signed)
Summary: Fax from pharmacy  Phone Note Other Incoming   Caller: Fax from pharmacy Summary of Call: Received fax for pts. Lorazepam, faxed back denied.  Pt. has appt. with Dr. Orvan Falconer 12/31/09 Initial call taken by: Wendall Mola CMA ( AAMA),  December 24, 2009 3:10 PM

## 2010-07-22 NOTE — Progress Notes (Signed)
Summary: ? allergic to Septra, itching/vomiting  Phone Note Other Incoming   Caller: Bjorn Loser, RN @ [facility] Summary of Call: Pt. developed itching and vomiting after starting Septra.  Pt's pharmacy had on their records that the pt. was allergic to Septra.  Pt. has stopped Septra for now.  Please advise.  Jennet Maduro RN  February 05, 2010 12:26 PM   Follow-up for Phone Call        Please change to Dapsone 100 mg by mouth daily. Follow-up by: [name] MD,  February 05, 2010 1:18 PM   New Allergies: ! SULFA New/Updated Medications: DAPSONE 100 MG TABS (DAPSONE) Take 1 tablet by mouth once a day New Allergies: ! SULFA

## 2010-07-22 NOTE — Assessment & Plan Note (Signed)
Summary: F/U [MKJ]   CC:  follow-up visit and Depression.  History of Present Illness: Jordan Calderon is in for his routine visit.  He has all his medications with him.  As soon as I walked into the room he asked that I not tell him what his blood work shows because he knows it will be bad.  He says he has not been taking his medications a cause he has been so anxious and was afraid he would not take them correctly.  He says, however, that he is ready to retry them.  His pharmacy has indicated that he has been paying cash for his lorazepam and used his two refills within 3 weeks last month.  It appears that some of the problem had been in refill it I gave him only 30 as opposed to 60 tablets that I had given him previously.  Depression History:      The patient is having a depressed mood most of the day and has a diminished interest in his usual daily activities.        Preventive Screening-Counseling & Management  Alcohol-Tobacco     Alcohol drinks/day: occassionally     Alcohol type: vodka     Smoking Status: current     Smoking Cessation Counseling: 03/28/2006     Smoke Cessation Stage: contemplative     Packs/Day: 0.75     Year Quit: 06/2007     Pack years: 1, started 30 years ago     Passive Smoke Exposure: no  Caffeine-Diet-Exercise     Caffeine use/day: yes     Does Patient Exercise: no  Hep-HIV-STD-Contraception     HIV Risk: no  Safety-Violence-Falls     Seat Belt Use: yes  Comments: condoms declined      Sexual History:  currently monogamous.        Drug Use:  former.     Prior Medication List:  COMBIVIR 150-300 MG TABS (LAMIVUDINE-ZIDOVUDINE) Take 1 tablet by mouth two times a day VIREAD 300 MG TABS (TENOFOVIR DISOPROXIL FUMARATE) Take 1 tablet by mouth once a day REYATAZ 300 MG CAPS (ATAZANAVIR SULFATE) Take 1 capsule by mouth once a day NORVIR 100 MG TABS (RITONAVIR) Take 1 tablet by mouth once a day PAXIL 10 MG TABS (PAROXETINE HCL) Take 1 tablet by mouth once a  day ATIVAN 2 MG TABS (LORAZEPAM) Take 1 tablet by mouth at bedtime as needed for anxiety NAPROSYN 250 MG TABS (NAPROXEN) Take 1 tablet by mouth two times a day as needed for pain   Updated Prior Medication List: [name] says that he has not been taking any of his HIV medications. Current Allergies (reviewed today): No known allergies  Social History: Sexual History:  currently monogamous Drug Use:  former  Vital Signs:  Patient profile:   50 year old male Height:      68 inches (172.72 cm) Weight:      166.25 pounds (75.57 kg) BMI:     25.37 Temp:     97.9 degrees F (36.61 degrees C) oral Pulse rate:   91 / minute BP sitting:   142 / 90  (left arm) Cuff size:   regular  Vitals Entered By: Jennet Maduro RN (December 31, 2009 9:29 AM) CC: follow-up visit, Depression Is Patient Diabetic? No Pain Assessment Patient in pain? no      Nutritional Status BMI of 19 -24 = normal Nutritional Status Detail appetite "GOOD"  Have you ever been in a relationship where you felt threatened,  hurt or afraid?No   Does patient need assistance? Functional Status Self care Ambulation Normal Comments NOT TAKING HIS RXES, MANY PROBLEMS IN HIS LIFE INTERFERRED WITH TAKING HIS RXES,  WILLING TO START BACK THIS COMING SUNDAY.   Physical Exam  General:  alert and well-nourished.   Mouth:  good dentition, pharynx pink and moist, no erythema, and no exudates.   Lungs:  normal breath sounds.  no crackles and no wheezes.  mild diffuse chest wall pain in the left axillary line. Heart:  normal rate, regular rhythm, and no murmur.   Skin:  no rashes.   Psych:   tearful, and moderately anxious.      Impression & Recommendations:  Problem # 1:  HIV DISEASE (ICD-042) Jordan Calderon's infection is not doing well as a result of his current inability to take his medications.  I will start PCP prophylaxis and set him up for mental health and treatment adherence counseling. Diagnostics Reviewed:  HIV: CDC-defined  AIDS (07/29/2008)   CD4: 160 (12/16/2009)   WBC: 4.0 (12/15/2009)   Hgb: 15.4 (12/15/2009)   HCT: 43.3 (12/15/2009)   Platelets: 127 (12/15/2009) HIV genotype: * (03/16/2009)   HIV-1 RNA: 90500 (12/15/2009)   HBSAg: No (08/14/2006)  His updated medication list for this problem includes:    Septra Ds 800-160 Mg Tabs (Sulfamethoxazole-trimethoprim) .Marland Kitchen... Take 1 tablet by mouth once a day  Orders: Misc. Referral (Misc. Ref)  Problem # 2:  ANXIETY (ICD-300.00) see above His updated medication list for this problem includes:    Paxil 10 Mg Tabs (Paroxetine hcl) .Marland Kitchen... Take 1 tablet by mouth once a day    Ativan 2 Mg Tabs (Lorazepam) .Marland Kitchen... Take 1-2 tablets by mouth at bedtime as needed for anxiety  Orders: Est. Patient Level IV (95621) Misc. Referral (Misc. Ref)  Medications Added to Medication List This Visit: 1)  Ativan 2 Mg Tabs (Lorazepam) .... Take 1-2 tablets by mouth at bedtime as needed for anxiety 2)  Septra Ds 800-160 Mg Tabs (Sulfamethoxazole-trimethoprim) .... Take 1 tablet by mouth once a day  Other Orders: Future Orders: T-CD4SP ([facility]) (CD4SP) ... 02/11/2010 T-HIV Viral Load [id number]) ... 02/11/2010  Patient Instructions: 1)  Please schedule a follow-up appointment in 2 months.  Prescriptions: SEPTRA DS 800-160 MG TABS (SULFAMETHOXAZOLE-TRIMETHOPRIM) Take 1 tablet by mouth once a day  #30 x 11   Entered and Authorized by:   [name] MD   Signed by:   [name] MD on 12/31/2009   Method used:   Print then Give to Patient   RxID:   [id number] ATIVAN 2 MG TABS (LORAZEPAM) Take 1-2 tablets by mouth at bedtime as needed for anxiety  #60 x 2   Entered and Authorized by:   [name] MD   Signed by:   [name] MD on 12/31/2009   Method used:   Print then Give to Patient   RxID:   [id number]

## 2010-07-22 NOTE — Progress Notes (Signed)
Summary: Refill request for Ativan  Phone Note Refill Request Message from:  Fax from Pharmacy  Refills Requested: Medication #1:  ATIVAN 2 MG TABS Take 1 tablet by mouth at bedtime as needed for anxiety   Last Refilled: 10/16/2009 Received a refill request that was faxed on 11/14/09 for refill on Ativan. Patient has not been seen since February 2011. Had 2 appts scheduled but no showed and 1 appt canceled. Do you want to prescribe refills on this mediation? If so, how many?   Method Requested: Telephone to Pharmacy Next Appointment Scheduled: None scheduled Initial call taken by: Paulo Fruit  BS,CPht II,MPH,  Nov 17, 2009 9:06 AM Caller: [facility] [address] [phone]* Reason for Call: Needs renewal  Follow-up for Phone Call        He can have 1 refill. Please let him know he must make and keep an appointment to receive it in the future.   Follow-up by: [name] MD,  November 23, 2009 4:47 PM

## 2010-07-22 NOTE — Progress Notes (Signed)
Summary: refill on lorazepam requested until MD appt on 07/28/09   Phone Note Call from Patient Call back at [phone] work   Caller: Patient Call For: [name] MD Reason for Call: Refill Medication Summary of Call: Requesting refill of lorazpam which he takes 2 at bedtime.  He comes in for scheduled appt. on 07/28/2009.  RN told pt. that Dr. Orvan Falconer would need to approve refill for this time period.  Pt. verbalized understanding. Jennet Maduro RN  June 24, 2009 11:33 AM   Follow-up for Phone Call        [facility] to refill just enough to last till that appointment. Follow-up by: [name] MD,  June 24, 2009 1:40 PM

## 2010-07-22 NOTE — Assessment & Plan Note (Signed)
Summary: fukam    Current Allergies: No known allergies

## 2010-07-22 NOTE — Miscellaneous (Signed)
Summary: Orders Update - referral to [facility]  Clinical Lists Changes  Orders: Added new Referral order of Misc. Referral (Misc. Ref) - Signed
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
## 2010-07-22 NOTE — Miscellaneous (Signed)
Summary: [facility]  [facility]   Imported By: Florinda Marker 02/11/2010 14:54:53  _____________________________________________________________________  External Attachment:    Type:   Image     Comment:   External Document

## 2010-07-22 NOTE — Miscellaneous (Signed)
Summary: HomeCare Providers: Medical Case Mgt. Referral  HomeCare Providers: Medical Case Mgt. Referral   Imported By: Florinda Marker 01/01/2010 15:57:08  _____________________________________________________________________  External Attachment:    Type:   Image     Comment:   External Document

## 2010-07-22 NOTE — Miscellaneous (Signed)
  Clinical Lists Changes  Observations: Added new observation of YEARAIDSPOS: 2005  (05/18/2010 9:44)

## 2010-07-22 NOTE — Miscellaneous (Signed)
Summary: Orders Update  Clinical Lists Changes  Problems: Added new problem of ENCOUNTER FOR LONG-TERM USE OF OTHER MEDICATIONS (ICD-V58.69) Orders: Added new Test order of T-CBC w/Diff [phone]) - Signed Added new Test order of T-CD4SP [facility]) (CD4SP) - Signed Added new Test order of T-Comprehensive Metabolic Panel [phone]) - Signed Added new Test order of T-HIV Viral Load [phone]) - Signed Added new Test order of T-RPR (Syphilis) [phone]) - Signed Added new Test order of T-Lipid Profile ([id number]) - Signed     Process Orders Check Orders Results:     [facility]: Check successful Tests Sent for requisitioning (July 14, 2009 9:28 AM):     07/14/2009: [facility] -- T-CBC w/Diff [[id number]] (signed)     07/14/2009: [facility] -- T-Comprehensive Metabolic Panel [[id number]] (signed)     07/14/2009: [facility] -- T-HIV Viral Load [phone] (signed)     07/14/2009: [facility] -- T-RPR (Syphilis) [id number] (signed)     07/14/2009: [facility] -- T-Lipid Profile [phone] (signed)

## 2010-07-22 NOTE — Miscellaneous (Signed)
Summary: RW UPDATE  Clinical Lists Changes  Observations: Added new observation of INCOMESOURCE: UNKNOWN (07/12/2010 16:24) Added new observation of HOUSEINCOME: 0  (07/12/2010 16:24) Added new observation of YEARLYEXPEN: 0  (07/12/2010 16:24)

## 2010-07-22 NOTE — Miscellaneous (Signed)
Summary: HCP Continuous:  HCP Continuous:   Imported By: Florinda Marker 04/23/2010 14:49:44  _____________________________________________________________________  External Attachment:    Type:   Image     Comment:   External Document

## 2010-07-23 NOTE — Miscellaneous (Signed)
Summary: Jordan Calderon. [name]  Joe D[name]   Imported By: Florinda Marker 04/28/2010 15:56:37  _____________________________________________________________________  External Attachment:    Type:   Image     Comment:   External Document

## 2010-08-10 ENCOUNTER — Encounter: Payer: Self-pay | Admitting: Internal Medicine

## 2010-08-17 NOTE — Miscellaneous (Signed)
Summary: Homecare Providers  Homecare Providers   Imported By: Florinda Marker 08/13/2010 16:24:02  _____________________________________________________________________  External Attachment:    Type:   Image     Comment:   External Document

## 2010-08-31 NOTE — Miscellaneous (Signed)
Summary: Homecare Providers: Case Comm Report  Homecare Providers: Case Comm Report   Imported By: Florinda Marker 08/26/2010 16:58:54  _____________________________________________________________________  External Attachment:    Type:   Image     Comment:   External Document

## 2010-09-05 LAB — T-HELPER CELL (CD4) - (RCID CLINIC ONLY)
CD4 % Helper T Cell: 16 % — ABNORMAL LOW (ref 33–55)
CD4 T Cell Abs: 240 uL — ABNORMAL LOW (ref 400–2700)
CD4 T Cell Abs: 160 uL — ABNORMAL LOW (ref 400–2700)

## 2010-09-13 ENCOUNTER — Other Ambulatory Visit: Payer: Self-pay | Admitting: *Deleted

## 2010-09-13 DIAGNOSIS — F419 Anxiety disorder, unspecified: Secondary | ICD-10-CM

## 2010-09-13 MED ORDER — LORAZEPAM 2 MG PO TABS: 2.0000 mg | ORAL_TABLET | Freq: Every evening | ORAL | Status: DC | PRN

## 2010-09-21 ENCOUNTER — Other Ambulatory Visit: Payer: Self-pay

## 2010-09-24 LAB — T-HELPER CELL (CD4) - (RCID CLINIC ONLY): CD4 T Cell Abs: 330 uL — ABNORMAL LOW (ref 400–2700)

## 2010-10-04 LAB — T-HELPER CELL (CD4) - (RCID CLINIC ONLY): CD4 % Helper T Cell: 20 % — ABNORMAL LOW (ref 33–55)

## 2010-10-05 ENCOUNTER — Encounter: Payer: Self-pay | Admitting: Internal Medicine

## 2010-10-05 ENCOUNTER — Ambulatory Visit (INDEPENDENT_AMBULATORY_CARE_PROVIDER_SITE_OTHER): Payer: Medicare Other | Admitting: Internal Medicine

## 2010-10-05 VITALS — BP 150/93 | HR 87 | Temp 97.5°F | Ht 68.5 in | Wt 165.5 lb

## 2010-10-05 DIAGNOSIS — B20 Human immunodeficiency virus [HIV] disease: Secondary | ICD-10-CM

## 2010-10-05 DIAGNOSIS — Z79899 Other long term (current) drug therapy: Secondary | ICD-10-CM

## 2010-10-05 DIAGNOSIS — F1910 Other psychoactive substance abuse, uncomplicated: Secondary | ICD-10-CM

## 2010-10-05 DIAGNOSIS — Z113 Encounter for screening for infections with a predominantly sexual mode of transmission: Secondary | ICD-10-CM

## 2010-10-05 DIAGNOSIS — F329 Major depressive disorder, single episode, unspecified: Secondary | ICD-10-CM

## 2010-10-05 NOTE — Assessment & Plan Note (Signed)
I will repeat a set of lab work today to establish a new baseline and have him see the treatment adherence counselor again to help him restart therapy. I will see him back in one month to make sure that he is doing better and repeat lab work in 6 weeks.

## 2010-10-05 NOTE — Assessment & Plan Note (Signed)
His chronic anxiety has worsened since his arrest and being off of all anti-retroviral medications exacerbates this. He is depressed but he is able to contract for safety. I asked him to call me if he is having more difficulties between now and his next visit.

## 2010-10-05 NOTE — Assessment & Plan Note (Signed)
I will refer him back to see Jordan Calderon for mental health and substance abuse counseling.
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
## 2010-10-06 LAB — COMPREHENSIVE METABOLIC PANEL
AST: 22 U/L (ref 0–37)
Albumin: 4.6 g/dL (ref 3.5–5.2)
Alkaline Phosphatase: 75 U/L (ref 39–117)
Potassium: 4.2 mEq/L (ref 3.5–5.3)
Sodium: 138 mEq/L (ref 135–145)
Total Bilirubin: 0.4 mg/dL (ref 0.3–1.2)
Total Protein: 8 g/dL (ref 6.0–8.3)

## 2010-10-06 LAB — CBC
MCHC: 34.8 g/dL (ref 30.0–36.0)
RDW: 13.4 % (ref 11.5–15.5)

## 2010-10-06 LAB — LIPID PANEL
LDL Cholesterol: 91 mg/dL (ref 0–99)
Total CHOL/HDL Ratio: 5.6 Ratio
Triglycerides: 163 mg/dL — ABNORMAL HIGH (ref <150)
VLDL: 33 mg/dL (ref 0–40)

## 2010-10-06 LAB — T-HELPER CELL (CD4) - (RCID CLINIC ONLY): CD4 % Helper T Cell: 10 % — ABNORMAL LOW (ref 33–55)

## 2010-10-07 LAB — HIV-1 RNA QUANT-NO REFLEX-BLD
HIV 1 RNA Quant: 135000 copies/mL — ABNORMAL HIGH (ref <20)
HIV-1 RNA Quant, Log: 5.13 {Log} — ABNORMAL HIGH (ref <1.30)

## 2010-11-09 ENCOUNTER — Ambulatory Visit: Payer: Medicare Other | Admitting: Internal Medicine

## 2010-12-06 ENCOUNTER — Other Ambulatory Visit: Payer: Self-pay | Admitting: *Deleted

## 2010-12-06 DIAGNOSIS — F419 Anxiety disorder, unspecified: Secondary | ICD-10-CM

## 2010-12-06 MED ORDER — LORAZEPAM 2 MG PO TABS: 2.0000 mg | ORAL_TABLET | Freq: Every evening | ORAL | Status: DC | PRN

## 2011-01-23 ENCOUNTER — Other Ambulatory Visit: Payer: Self-pay | Admitting: Internal Medicine

## 2011-01-23 DIAGNOSIS — F329 Major depressive disorder, single episode, unspecified: Secondary | ICD-10-CM

## 2011-01-24 ENCOUNTER — Encounter: Payer: Self-pay | Admitting: Internal Medicine

## 2011-01-24 ENCOUNTER — Other Ambulatory Visit: Payer: Self-pay | Admitting: *Deleted

## 2011-01-24 ENCOUNTER — Ambulatory Visit (INDEPENDENT_AMBULATORY_CARE_PROVIDER_SITE_OTHER): Payer: Medicare Other | Admitting: Internal Medicine

## 2011-01-24 VITALS — BP 115/76 | HR 78 | Temp 97.6°F | Ht 69.0 in | Wt 162.5 lb

## 2011-01-24 DIAGNOSIS — B20 Human immunodeficiency virus [HIV] disease: Secondary | ICD-10-CM

## 2011-01-24 MED ORDER — TENOFOVIR DISOPROXIL FUMARATE 300 MG PO TABS: 300.0000 mg | ORAL_TABLET | Freq: Every day | ORAL | Status: DC

## 2011-01-24 MED ORDER — LAMIVUDINE-ZIDOVUDINE 150-300 MG PO TABS: 1.0000 | ORAL_TABLET | Freq: Two times a day (BID) | ORAL | Status: DC

## 2011-01-24 MED ORDER — RITONAVIR 100 MG PO TABS: 100.0000 mg | ORAL_TABLET | Freq: Every day | ORAL | Status: DC

## 2011-01-24 MED ORDER — ATAZANAVIR SULFATE 300 MG PO CAPS: 300.0000 mg | ORAL_CAPSULE | Freq: Every day | ORAL | Status: DC

## 2011-01-24 MED ORDER — DAPSONE 100 MG PO TABS
100.0000 mg | ORAL_TABLET | Freq: Every day | ORAL | Status: DC
Start: 2011-01-24 — End: 2011-08-23

## 2011-01-24 NOTE — Progress Notes (Signed)
  Subjective:    Patient ID: Jordan Calderon, male    DOB: Jun 13, 1961, 50 y.o.   MRN: [id number]  HPI Jordan Calderon is in for his routine visit. He did not restart his medications after the April visit but states that he is to start this evening. He needs refills on his medications. He states that his sister, [name], will be helping him fill out his pillbox.    Review of Systems     Objective:   Physical Exam  Constitutional: He appears well-developed and well-nourished. No distress.  HENT:  Mouth/Throat: Oropharynx is clear and moist. No oropharyngeal exudate.  Cardiovascular: Normal rate, regular rhythm and normal heart sounds.   No murmur heard. Pulmonary/Chest: Breath sounds normal. He has no wheezes. He has no rales.  Psychiatric:       He is slightly anxious as usual.          Assessment & Plan:

## 2011-01-24 NOTE — Assessment & Plan Note (Signed)
I have given Jordan Calderon a new pillbox and helped him fill out the pillbox with the few remaining meds he has. We will call in complete refills on all of his medications. I will check baseline labs today and then repeat them in 6 weeks.

## 2011-01-25 LAB — T-HELPER CELL (CD4) - (RCID CLINIC ONLY)
CD4 % Helper T Cell: 11 % — ABNORMAL LOW (ref 33–55)
CD4 T Cell Abs: 230 uL — ABNORMAL LOW (ref 400–2700)

## 2011-01-26 LAB — HIV-1 RNA QUANT-NO REFLEX-BLD: HIV-1 RNA Quant, Log: 4.95 {Log} — ABNORMAL HIGH (ref <1.30)

## 2011-02-17 ENCOUNTER — Other Ambulatory Visit: Payer: Self-pay | Admitting: *Deleted

## 2011-02-17 DIAGNOSIS — F419 Anxiety disorder, unspecified: Secondary | ICD-10-CM

## 2011-02-17 MED ORDER — LORAZEPAM 2 MG PO TABS: 2.0000 mg | ORAL_TABLET | Freq: Every evening | ORAL | Status: DC | PRN

## 2011-03-17 LAB — T-HELPER CELL (CD4) - (RCID CLINIC ONLY): CD4 % Helper T Cell: 20 — ABNORMAL LOW

## 2011-03-24 ENCOUNTER — Ambulatory Visit (INDEPENDENT_AMBULATORY_CARE_PROVIDER_SITE_OTHER): Payer: Medicare Other | Admitting: *Deleted

## 2011-03-24 ENCOUNTER — Other Ambulatory Visit: Payer: Self-pay | Admitting: Infectious Diseases

## 2011-03-24 ENCOUNTER — Other Ambulatory Visit: Payer: Medicare Other

## 2011-03-24 DIAGNOSIS — B20 Human immunodeficiency virus [HIV] disease: Secondary | ICD-10-CM

## 2011-03-24 DIAGNOSIS — Z23 Encounter for immunization: Secondary | ICD-10-CM

## 2011-03-25 LAB — T-HELPER CELL (CD4) - (RCID CLINIC ONLY)
CD4 % Helper T Cell: 8 % — ABNORMAL LOW (ref 33–55)
CD4 T Cell Abs: 390 — ABNORMAL LOW

## 2011-04-05 LAB — T-HELPER CELL (CD4) - (RCID CLINIC ONLY)
CD4 % Helper T Cell: 20 — ABNORMAL LOW
CD4 T Cell Abs: 330 — ABNORMAL LOW

## 2011-04-07 ENCOUNTER — Ambulatory Visit: Payer: Medicare Other | Admitting: Internal Medicine

## 2011-04-22 ENCOUNTER — Encounter: Payer: Self-pay | Admitting: Internal Medicine

## 2011-04-22 ENCOUNTER — Ambulatory Visit (INDEPENDENT_AMBULATORY_CARE_PROVIDER_SITE_OTHER): Payer: Medicare Other | Admitting: Internal Medicine

## 2011-04-22 ENCOUNTER — Ambulatory Visit: Payer: Medicare Other | Admitting: Internal Medicine

## 2011-04-22 ENCOUNTER — Telehealth: Payer: Self-pay | Admitting: *Deleted

## 2011-04-22 VITALS — BP 132/87 | HR 88 | Temp 98.0°F | Wt 167.0 lb

## 2011-04-22 DIAGNOSIS — F411 Generalized anxiety disorder: Secondary | ICD-10-CM

## 2011-04-22 DIAGNOSIS — Z23 Encounter for immunization: Secondary | ICD-10-CM

## 2011-04-22 DIAGNOSIS — B20 Human immunodeficiency virus [HIV] disease: Secondary | ICD-10-CM

## 2011-04-22 DIAGNOSIS — F419 Anxiety disorder, unspecified: Secondary | ICD-10-CM

## 2011-04-22 MED ORDER — LORAZEPAM 2 MG PO TABS: 1.0000 mg | ORAL_TABLET | Freq: Two times a day (BID) | ORAL | Status: DC | PRN

## 2011-04-22 NOTE — Progress Notes (Signed)
  Subjective:    Patient ID: Jordan Calderon, male    DOB: Jul 24, 1960, 50 y.o.   MRN: [id number]  HPI Zachrey is in for his routine visit. He recently lost his job which he believes is related to the police who arrested him for his DUI telling his employer about his HIV infection. He has been out of work for about one month. He had not started taking his medications after his last visit. He states that he feels now that he is not working he will find it to be easier to restart them. He has been more anxious and depressed and wants to see a therapist. At one point last week he did feel like he might want to "just not be here anymore" but he states that he would never act on any suicidal thoughts. He has the full supportive his sister. He thinks that some of his anxiety and depression is related to guilt. He continues to feel bad that his former partner, Gabriel Rung, did not live long enough to take advantage of the new therapies that are available to him.  Last week he started taking one half tablet of his lorazepam during the day in addition to his one tablet at bedtime and feels that that has helped his anxiety. He would like to start seeing a counselor again and he feels like it would be helpful to have an adherence counselor come to see him at least once a week.    Review of Systems     Objective:   Physical Exam  Constitutional: He appears well-developed and well-nourished. He appears distressed.  HENT:  Mouth/Throat: Oropharynx is clear and moist. No oropharyngeal exudate.  Eyes: Conjunctivae are normal.  Cardiovascular: Normal rate, regular rhythm and normal heart sounds.   No murmur heard. Pulmonary/Chest: Breath sounds normal. He has no wheezes. He has no rales.  Skin: No rash noted.  Psychiatric:       He is very anxious and tearful.          Assessment & Plan:

## 2011-04-22 NOTE — Assessment & Plan Note (Signed)
I agree with increasing his dose of lorazepam as anxiety is a major barrier to him being able to restart his medications and take them correctly. I will have him see Denyce Robert for help with his anxiety and depression.

## 2011-04-22 NOTE — Assessment & Plan Note (Signed)
Not surprisingly his HIV is progressing and his CD4 count is down to 160. His viral load is up to 115,000. He has his medications with him already in the pillbox correctly. I have instructed him to take them starting tonight. I will also try to arrange outpatient adherence counseling. I will see him back within one week.

## 2011-04-22 NOTE — Telephone Encounter (Signed)
I spoke with Raynelle Fanning and Bjorn Loser at [facility] and they feel it would be more appropriate for Jordan Calderon to be seen through [facility] for medical management, because he already knows how to fill his pill box.  I called the patient and he has agreed to see a counselor at [facility].  Will make the referral. Wendall Mola CMA

## 2011-04-28 ENCOUNTER — Encounter: Payer: Self-pay | Admitting: Internal Medicine

## 2011-04-28 ENCOUNTER — Ambulatory Visit (INDEPENDENT_AMBULATORY_CARE_PROVIDER_SITE_OTHER): Payer: Medicare Other | Admitting: Internal Medicine

## 2011-04-28 DIAGNOSIS — B20 Human immunodeficiency virus [HIV] disease: Secondary | ICD-10-CM

## 2011-04-28 NOTE — Assessment & Plan Note (Signed)
The only way we will be successful in managing times HIV infection is if we get him more comprehensive wraparound services to address his financial, insurance, transportation, and mental health issues. We will have him see a [facility] case manager today and I will see him back in one week.

## 2011-04-28 NOTE — Progress Notes (Signed)
  Subjective:    Patient ID: Jordan Calderon, male    DOB: 11-23-1960, 50 y.o.   MRN: [id number]  HPI Jordan Calderon is in for his one-week followup visit. His wife has come even more unraveled in the last week than he was before. He discovered that he has lost his Medicaid and therefore ran out of his Paxil 3 days ago and is not able to get refills of his antiretroviral medications. He states that he has been confused and is not sure what medicines he is supposed to be taking even though he was given a complete list last week. He has not called to set up counseling for his depression and anxiety because he is struggling with lack of transportation. He is worried about talking to his sister, [name], who lives in [place]. She has been very involved in his care but he is worried about telling her that he is off all of his medications. On top of all of that he has a dog at home who is not eating and has sick but he has no means to take the dog to the Vet to see what is wrong.    Review of Systems     Objective:   Physical Exam  Constitutional: He appears distressed.  Psychiatric:       He is a tearful, anxious wreck.   HIV 1 RNA Quant (copies/mL)  Date Value  03/24/2011 115000*  01/24/2011 89800*  10/05/2010 135000*     CD4 T Cell Abs (cmm)  Date Value  03/24/2011 160*  01/24/2011 230*  10/05/2010 240*                Assessment & Plan:

## 2011-04-29 ENCOUNTER — Ambulatory Visit: Payer: Medicare Other | Admitting: Internal Medicine

## 2011-05-05 ENCOUNTER — Encounter: Payer: Self-pay | Admitting: Internal Medicine

## 2011-05-05 ENCOUNTER — Ambulatory Visit (INDEPENDENT_AMBULATORY_CARE_PROVIDER_SITE_OTHER): Payer: Medicare Other | Admitting: Internal Medicine

## 2011-05-05 ENCOUNTER — Ambulatory Visit
Admission: RE | Admit: 2011-05-05 | Discharge: 2011-05-05 | Disposition: A | Discharge status: Home / Self Care | Payer: Medicare Other | Source: Ambulatory Visit | Attending: Internal Medicine | Admitting: Internal Medicine

## 2011-05-05 DIAGNOSIS — R0781 Pleurodynia: Secondary | ICD-10-CM

## 2011-05-05 DIAGNOSIS — R079 Chest pain, unspecified: Secondary | ICD-10-CM

## 2011-05-05 DIAGNOSIS — F411 Generalized anxiety disorder: Secondary | ICD-10-CM

## 2011-05-05 DIAGNOSIS — F3289 Other specified depressive episodes: Secondary | ICD-10-CM

## 2011-05-05 DIAGNOSIS — F329 Major depressive disorder, single episode, unspecified: Secondary | ICD-10-CM

## 2011-05-05 DIAGNOSIS — S2239XA Fracture of one rib, unspecified side, initial encounter for closed fracture: Secondary | ICD-10-CM

## 2011-05-05 DIAGNOSIS — B20 Human immunodeficiency virus [HIV] disease: Secondary | ICD-10-CM

## 2011-05-05 MED ORDER — LORAZEPAM 2 MG PO TABS: 1.0000 mg | ORAL_TABLET | Freq: Two times a day (BID) | ORAL | Status: DC | PRN
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
## 2011-05-05 NOTE — Assessment & Plan Note (Signed)
He is doing better hopeful that he can get his Medicaid reinstated and get back on his medications and take them correctly with the assistance of [facility].
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
## 2011-05-05 NOTE — Assessment & Plan Note (Signed)
He seems hopeful that the counseling he is going to receive through [facility] will help. His chronic depression and anxiety are probably rooted in some chronic guilt that he is holding onto and he already has more insight in the past week when I have seen in the last 10 years.

## 2011-05-06 ENCOUNTER — Encounter: Payer: Self-pay | Admitting: *Deleted

## 2011-06-02 ENCOUNTER — Telehealth: Payer: Self-pay | Admitting: *Deleted

## 2011-06-02 ENCOUNTER — Ambulatory Visit: Payer: Medicare Other | Admitting: Internal Medicine

## 2011-06-02 NOTE — Telephone Encounter (Signed)
Message left to call [facility] to make another appt with Dr. Orvan Falconer for f/u.

## 2011-07-13 ENCOUNTER — Other Ambulatory Visit: Payer: Self-pay | Admitting: Internal Medicine

## 2011-07-13 DIAGNOSIS — Z79899 Other long term (current) drug therapy: Secondary | ICD-10-CM

## 2011-07-13 DIAGNOSIS — B20 Human immunodeficiency virus [HIV] disease: Secondary | ICD-10-CM

## 2011-07-13 DIAGNOSIS — Z113 Encounter for screening for infections with a predominantly sexual mode of transmission: Secondary | ICD-10-CM

## 2011-07-25 ENCOUNTER — Other Ambulatory Visit: Payer: Medicare Other

## 2011-07-25 DIAGNOSIS — B20 Human immunodeficiency virus [HIV] disease: Secondary | ICD-10-CM

## 2011-07-25 DIAGNOSIS — Z79899 Other long term (current) drug therapy: Secondary | ICD-10-CM

## 2011-07-25 DIAGNOSIS — Z113 Encounter for screening for infections with a predominantly sexual mode of transmission: Secondary | ICD-10-CM

## 2011-07-25 LAB — CBC WITH DIFFERENTIAL/PLATELET
Eosinophils Absolute: 0.1 10*3/uL (ref 0.0–0.7)
Eosinophils Relative: 2 % (ref 0–5)
HCT: 46 % (ref 39.0–52.0)
Hemoglobin: 15.9 g/dL (ref 13.0–17.0)
Lymphocytes Relative: 43 % (ref 12–46)
Lymphs Abs: 1.8 10*3/uL (ref 0.7–4.0)
MCH: 31.4 pg (ref 26.0–34.0)
MCV: 90.7 fL (ref 78.0–100.0)
Monocytes Absolute: 0.4 10*3/uL (ref 0.1–1.0)
Monocytes Relative: 10 % (ref 3–12)
Platelets: 128 10*3/uL — ABNORMAL LOW (ref 150–400)
WBC: 4.1 10*3/uL (ref 4.0–10.5)

## 2011-07-25 LAB — COMPREHENSIVE METABOLIC PANEL
BUN: 19 mg/dL (ref 6–23)
CO2: 27 mEq/L (ref 19–32)
Calcium: 9.4 mg/dL (ref 8.4–10.5)
Chloride: 105 mEq/L (ref 96–112)
Creat: 1.01 mg/dL (ref 0.50–1.35)
Glucose, Bld: 71 mg/dL (ref 70–99)
Total Bilirubin: 0.4 mg/dL (ref 0.3–1.2)

## 2011-07-25 LAB — LIPID PANEL
Cholesterol: 153 mg/dL (ref 0–200)
Triglycerides: 194 mg/dL — ABNORMAL HIGH (ref <150)
VLDL: 39 mg/dL (ref 0–40)

## 2011-07-26 LAB — T-HELPER CELL (CD4) - (RCID CLINIC ONLY): CD4 % Helper T Cell: 8 % — ABNORMAL LOW (ref 33–55)

## 2011-07-27 LAB — HIV-1 RNA QUANT-NO REFLEX-BLD
HIV 1 RNA Quant: 170089 copies/mL — ABNORMAL HIGH (ref <20)
HIV-1 RNA Quant, Log: 5.23 {Log} — ABNORMAL HIGH (ref <1.30)

## 2011-08-09 ENCOUNTER — Telehealth: Payer: Self-pay | Admitting: *Deleted

## 2011-08-09 ENCOUNTER — Ambulatory Visit: Payer: Medicare Other | Admitting: Internal Medicine

## 2011-08-09 NOTE — Telephone Encounter (Signed)
Message left on mobile phone to call [facility] to reschedule MD appt.

## 2011-08-23 ENCOUNTER — Other Ambulatory Visit: Payer: Self-pay | Admitting: *Deleted

## 2011-08-23 ENCOUNTER — Encounter: Payer: Self-pay | Admitting: Internal Medicine

## 2011-08-23 ENCOUNTER — Ambulatory Visit (INDEPENDENT_AMBULATORY_CARE_PROVIDER_SITE_OTHER): Payer: Medicare Other | Admitting: Internal Medicine

## 2011-08-23 VITALS — BP 130/84 | HR 91 | Temp 97.8°F | Ht 69.0 in | Wt 172.8 lb

## 2011-08-23 DIAGNOSIS — B20 Human immunodeficiency virus [HIV] disease: Secondary | ICD-10-CM

## 2011-08-23 MED ORDER — TENOFOVIR DISOPROXIL FUMARATE 300 MG PO TABS: 300.0000 mg | ORAL_TABLET | Freq: Every day | ORAL | Status: DC

## 2011-08-23 MED ORDER — DAPSONE 100 MG PO TABS: 100.0000 mg | ORAL_TABLET | Freq: Every day | ORAL | Status: DC

## 2011-08-23 MED ORDER — ATAZANAVIR SULFATE 300 MG PO CAPS: 300.0000 mg | ORAL_CAPSULE | Freq: Every day | ORAL | Status: DC

## 2011-08-23 MED ORDER — RITONAVIR 100 MG PO TABS: 100.0000 mg | ORAL_TABLET | Freq: Every day | ORAL | Status: DC

## 2011-08-23 MED ORDER — LAMIVUDINE-ZIDOVUDINE 150-300 MG PO TABS: 1.0000 | ORAL_TABLET | Freq: Two times a day (BID) | ORAL | Status: DC

## 2011-08-23 NOTE — Progress Notes (Signed)
[facility] referral for Medication/Treatment Adherence completed and faxed to [phone].  Copy of paper referral given to Referral Coordinator.

## 2011-08-23 NOTE — Progress Notes (Signed)
Patient ID: Jordan Calderon, male   DOB: 12/09/1960, 51 y.o.   MRN: [id number]  INFECTIOUS DISEASE PROGRESS NOTE    Subjective: Jordan Calderon is in for his routine visit. He continues to work with [facility] and is in counseling with [facility] but has not restarted his HIV meds yet. He says he has forgotten how to take them. He is on his lorazepam and paroxetine. His mood is "ok" and stable. He has started smoking cigarettes again and occasional marijuana.  Objective: Temp: 97.8 F (36.6 C) (03/05 1129) Temp src: Oral (03/05 1129) BP: 130/84 mmHg (03/05 1129) Pulse Rate: 91  (03/05 1129)  General: anxious Skin: no rash Lungs: clear Cor: reg S1 and S2 Abdomen: soft and nontender   Lab Results HIV 1 RNA Quant (copies/mL)  Date Value  07/25/2011 170089*  03/24/2011 115000*  01/24/2011 89800*     CD4 T Cell Abs (cmm)  Date Value  07/25/2011 140*  03/24/2011 160*  01/24/2011 230*     Assessment: His HIV is out of control off of therapy. He has Medicaid and wants to restart. He is interested in working with Jaquita Folds on adherence.  Chronic anxiety and depressing but in counseling.  Plan: 1. Restart meds 2. Adherence counseling 3. MH and SA counseling given today 4. Return after labs 6 weeks   [name], MD [facility] for Infectious Diseases [facility] [phone] pager   [phone] cell 08/23/2011, 12:06 PM

## 2011-09-26 ENCOUNTER — Encounter: Payer: Self-pay | Admitting: *Deleted

## 2011-09-26 NOTE — Progress Notes (Unsigned)
Patient ID: Jordan Calderon, male   DOB: [birth year], 51 y.o.   MRN: [id number]  Spoke with Jaquita Folds [phone]) from [facility] and stated he had been waiting on the referral and had not received it from Raynelle Fanning (case Production designer, theatre/television/film). I resent OV, labs, med list and referral form for Med Adherence to (#[phone]). Tacey Heap RN

## 2011-10-05 ENCOUNTER — Other Ambulatory Visit: Payer: Self-pay | Admitting: *Deleted

## 2011-10-05 ENCOUNTER — Telehealth: Payer: Self-pay | Admitting: *Deleted

## 2011-10-05 DIAGNOSIS — F329 Major depressive disorder, single episode, unspecified: Secondary | ICD-10-CM

## 2011-10-05 MED ORDER — PAROXETINE HCL 10 MG PO TABS: 20.0000 mg | ORAL_TABLET | ORAL | Status: DC

## 2011-10-05 MED ORDER — PAROXETINE HCL 10 MG PO TABS: 10.0000 mg | ORAL_TABLET | ORAL | Status: DC

## 2011-10-05 NOTE — Telephone Encounter (Signed)
Treatment Adherence RN visiting regularly w/ pt, believes that pt would benefit from an increase in Paxil dose to 20mg .  Pt also needs refill on Lorazepam.  Please advise.

## 2011-10-05 NOTE — Telephone Encounter (Signed)
I agree with the increase in his Paxil dose.

## 2011-10-06 ENCOUNTER — Other Ambulatory Visit: Payer: Self-pay | Admitting: Licensed Clinical Social Worker

## 2011-10-06 ENCOUNTER — Other Ambulatory Visit: Payer: Self-pay | Admitting: *Deleted

## 2011-10-06 DIAGNOSIS — F419 Anxiety disorder, unspecified: Secondary | ICD-10-CM

## 2011-10-06 MED ORDER — LORAZEPAM 2 MG PO TABS: 1.0000 mg | ORAL_TABLET | Freq: Two times a day (BID) | ORAL | Status: DC | PRN

## 2011-10-06 NOTE — Telephone Encounter (Signed)
Notified pt the rf for ativan was called in to the pharmacy.  Pt verbalized understanding.

## 2011-10-06 NOTE — Telephone Encounter (Signed)
OK to refill Ativan rx?  Quantity # and refill # please

## 2011-10-06 NOTE — Telephone Encounter (Signed)
Please give lorazepam 2mg  #60 with 3 refills.

## 2011-10-10 MED ORDER — LORAZEPAM 2 MG PO TABS: 2.0000 mg | ORAL_TABLET | Freq: Four times a day (QID) | ORAL | Status: DC | PRN

## 2011-10-10 NOTE — Telephone Encounter (Signed)
Addended by: Jennet Maduro D on: 10/10/2011 10:07 AM   Modules accepted: Orders

## 2011-10-10 NOTE — Telephone Encounter (Signed)
Pharmacy called with refill information.  Pt called with refill information.

## 2011-10-11 ENCOUNTER — Other Ambulatory Visit: Payer: Medicare Other

## 2011-10-14 ENCOUNTER — Other Ambulatory Visit: Payer: Medicare Other

## 2011-10-14 DIAGNOSIS — B20 Human immunodeficiency virus [HIV] disease: Secondary | ICD-10-CM

## 2011-10-14 LAB — CBC
HCT: 40.4 % (ref 39.0–52.0)
MCV: 92.2 fL (ref 78.0–100.0)
Platelets: 171 10*3/uL (ref 150–400)
RBC: 4.38 MIL/uL (ref 4.22–5.81)
RDW: 13.4 % (ref 11.5–15.5)
WBC: 4.6 10*3/uL (ref 4.0–10.5)

## 2011-10-14 LAB — COMPREHENSIVE METABOLIC PANEL
ALT: 25 U/L (ref 0–53)
AST: 28 U/L (ref 0–37)
Albumin: 4.2 g/dL (ref 3.5–5.2)
BUN: 18 mg/dL (ref 6–23)
CO2: 26 mEq/L (ref 19–32)
Calcium: 9.1 mg/dL (ref 8.4–10.5)
Chloride: 104 mEq/L (ref 96–112)
Creat: 0.85 mg/dL (ref 0.50–1.35)
Potassium: 4.4 mEq/L (ref 3.5–5.3)

## 2011-10-14 LAB — T-HELPER CELL (CD4) - (RCID CLINIC ONLY): CD4 % Helper T Cell: 8 % — ABNORMAL LOW (ref 33–55)

## 2011-10-17 LAB — HIV-1 RNA QUANT-NO REFLEX-BLD: HIV 1 RNA Quant: 7497 copies/mL — ABNORMAL HIGH (ref <20)

## 2011-10-25 ENCOUNTER — Encounter: Payer: Self-pay | Admitting: Internal Medicine

## 2011-10-25 ENCOUNTER — Ambulatory Visit (INDEPENDENT_AMBULATORY_CARE_PROVIDER_SITE_OTHER): Payer: Medicare Other | Admitting: Internal Medicine

## 2011-10-25 VITALS — BP 135/80 | HR 97 | Temp 98.0°F | Ht 69.0 in | Wt 170.5 lb

## 2011-10-25 DIAGNOSIS — B20 Human immunodeficiency virus [HIV] disease: Secondary | ICD-10-CM

## 2011-10-25 NOTE — Progress Notes (Signed)
Patient ID: Jordan Calderon, male   DOB: 1960/11/03, 51 y.o.   MRN: [id number]  INFECTIOUS DISEASE PROGRESS NOTE    Subjective: Jordan Calderon is in for his routine visit. He is feeling better. He has been working with [employer], our treatment the appearance counselor and has been using his pillbox to organize his medications. He started back on his Combivir, Viread, Reyataz and Norvir on April 16. He does not recall missing any doses of his medications. I have agreed with increasing his Paxil to 20 mg daily but he is still taking 10 mg.   Objective: Temp: 98 F (36.7 C) (05/07 1106) Temp src: Oral (05/07 1106) BP: 135/80 mmHg (05/07 1106) Pulse Rate: 97  (05/07 1106)  General: Slightly anxious but smiling and overall his affect is improved Skin: no rash Lungs: Clear Cor: Regular S1 and S2 no murmurs  Lab Results HIV 1 RNA Quant (copies/mL)  Date Value  10/14/2011 7497*  07/25/2011 170089*  03/24/2011 115000*     CD4 T Cell Abs (cmm)  Date Value  10/14/2011 180*  07/25/2011 140*  03/24/2011 160*     Assessment: His HIV infection is improving. His viral load has decreased dramatically when checked only 10 days after restarting his antiretroviral regimen. I will continue his current regimen and repeat lab work in 6 weeks.  His chronic anxiety continues to interfere with his ability to manage his HIV infection over time. I will have him increase his Paxil to 20 mg daily.  Plan: 1. Continue current medications but increase Paxil to 20 mg daily 2. Return to clinic after lab work in 6 weeks   [name], MD [facility] for Infectious Diseases [facility] [phone] pager   [phone] cell 10/25/2011, 11:20 AM

## 2011-11-08 ENCOUNTER — Telehealth: Payer: Self-pay | Admitting: *Deleted

## 2011-11-08 NOTE — Telephone Encounter (Signed)
Avelino Leeds from [employer] called to inquire about any recent rpr done. There was one done on 07/25/11 & it was negative. She had no further questions

## 2011-12-13 ENCOUNTER — Other Ambulatory Visit: Payer: Medicare Other

## 2011-12-27 ENCOUNTER — Encounter: Payer: Self-pay | Admitting: Internal Medicine

## 2011-12-27 ENCOUNTER — Ambulatory Visit (INDEPENDENT_AMBULATORY_CARE_PROVIDER_SITE_OTHER): Payer: Medicare Other | Admitting: Internal Medicine

## 2011-12-27 VITALS — BP 136/92 | HR 80 | Temp 98.2°F | Ht 69.0 in | Wt 162.2 lb

## 2011-12-27 DIAGNOSIS — B20 Human immunodeficiency virus [HIV] disease: Secondary | ICD-10-CM

## 2011-12-27 DIAGNOSIS — F419 Anxiety disorder, unspecified: Secondary | ICD-10-CM

## 2011-12-27 DIAGNOSIS — F411 Generalized anxiety disorder: Secondary | ICD-10-CM

## 2011-12-27 MED ORDER — LORAZEPAM 2 MG PO TABS: 2.0000 mg | ORAL_TABLET | Freq: Four times a day (QID) | ORAL | Status: DC | PRN

## 2011-12-27 NOTE — Progress Notes (Signed)
Patient ID: Jordan Calderon, male   DOB: 1960-08-15, 51 y.o.   MRN: [id number]     [facility] for Infectious Disease  Patient Active Problem List  Diagnosis  . HIV DISEASE  . HEPATITIS C  . CANDIDAL ESOPHAGITIS  . ANXIETY  . SUBSTANCE ABUSE, MULTIPLE  . DEPRESSION  . UPPER RESPIRATORY INFECTION, ACUTE  . DENTAL CARIES  . ANAL FISSURE  . FRACTURE, RIB, LEFT  . Cigarette smoker    Patient's Medications  New Prescriptions   No medications on file  Previous Medications   ATAZANAVIR (REYATAZ) 300 MG CAPSULE    Take 1 capsule (300 mg total) by mouth daily with breakfast.   DAPSONE 100 MG TABLET    Take 1 tablet (100 mg total) by mouth daily.   LAMIVUDINE-ZIDOVUDINE (COMBIVIR) 150-300 MG PER TABLET    Take 1 tablet by mouth 2 (two) times daily.   LORAZEPAM (ATIVAN) 2 MG TABLET    Take 1 tablet (2 mg total) by mouth every 6 (six) hours as needed.   NAPROXEN (NAPROSYN) 250 MG TABLET    Take 250 mg by mouth 2 (two) times daily as needed.     PAROXETINE (PAXIL) 10 MG TABLET    Take 2 tablets (20 mg total) by mouth every morning.   RITONAVIR (NORVIR) 100 MG TABS    Take 1 tablet (100 mg total) by mouth daily.   TENOFOVIR (VIREAD) 300 MG TABLET    Take 1 tablet (300 mg total) by mouth daily.  Modified Medications   No medications on file  Discontinued Medications   No medications on file    Subjective: Jordan Calderon is in for his routine visit. He has been off of his HIV medicines for 3 weeks. 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 He did not feel that he was able to do that and take his medications at the same time. He has his pillbox with him and wants to know if he can divide his medicines up so that his taking fewer medicines in the evening and more of them in the morning.  Objective: Temp: 98.2 F (36.8 C) (07/09 1059) Temp src: Oral (07/09 1059) BP: 136/92 mmHg  (07/09 1059) Pulse Rate: 80  (07/09 1059)  General: He is slightly nervous his usual Skin: Tanned but no rash Lungs: Clear  Lab Results HIV 1 RNA Quant (copies/mL)  Date Value  10/14/2011 7497*  07/25/2011 170089*  03/24/2011 115000*     CD4 T Cell Abs (cmm)  Date Value  10/14/2011 180*  07/25/2011 140*  03/24/2011 160*     Assessment: As usual Jordan Calderon continues to have a great deal of difficulty with adherence. He needs to take his Reyataz and Norvir year at the same time each day but can divide his other medicines up so that he has a more even split between morning and evening doses. I have encouraged him to try not to Miss a single dose.  Plan: 1. Restart medications 2. Followup after lab work in 6 weeks   [name], MD [facility] for Infectious Disease [facility] [phone] pager   [phone] cell 12/27/2011, 11:08 AM   And

## 2012-02-14 ENCOUNTER — Other Ambulatory Visit: Payer: Medicare Other

## 2012-02-27 ENCOUNTER — Telehealth: Payer: Self-pay | Admitting: *Deleted

## 2012-02-27 NOTE — Telephone Encounter (Signed)
Message left to call office to schedule Lab Work appt and reschedule MD appt for 2 weeks later.

## 2012-02-28 ENCOUNTER — Telehealth: Payer: Self-pay | Admitting: *Deleted

## 2012-02-28 ENCOUNTER — Ambulatory Visit: Payer: Medicare Other | Admitting: Internal Medicine

## 2012-02-28 NOTE — Telephone Encounter (Signed)
Message left for pt to call for lab and MD appts.

## 2012-03-22 ENCOUNTER — Other Ambulatory Visit: Payer: Medicare Other

## 2012-03-22 ENCOUNTER — Other Ambulatory Visit: Payer: Self-pay | Admitting: *Deleted

## 2012-03-22 DIAGNOSIS — F419 Anxiety disorder, unspecified: Secondary | ICD-10-CM

## 2012-03-22 DIAGNOSIS — B20 Human immunodeficiency virus [HIV] disease: Secondary | ICD-10-CM

## 2012-03-22 LAB — CBC
HCT: 44.4 % (ref 39.0–52.0)
Hemoglobin: 15.7 g/dL (ref 13.0–17.0)
MCH: 32.3 pg (ref 26.0–34.0)
MCHC: 35.4 g/dL (ref 30.0–36.0)
MCV: 91.4 fL (ref 78.0–100.0)

## 2012-03-22 LAB — COMPREHENSIVE METABOLIC PANEL
Alkaline Phosphatase: 70 U/L (ref 39–117)
BUN: 16 mg/dL (ref 6–23)
CO2: 26 mEq/L (ref 19–32)
Glucose, Bld: 76 mg/dL (ref 70–99)
Sodium: 139 mEq/L (ref 135–145)
Total Bilirubin: 0.6 mg/dL (ref 0.3–1.2)

## 2012-03-22 MED ORDER — LORAZEPAM 2 MG PO TABS: 2.0000 mg | ORAL_TABLET | Freq: Four times a day (QID) | ORAL | Status: DC | PRN

## 2012-03-22 NOTE — Telephone Encounter (Signed)
Pt needs to keep upcoming appointments.

## 2012-03-23 LAB — T-HELPER CELL (CD4) - (RCID CLINIC ONLY)
CD4 % Helper T Cell: 8 % — ABNORMAL LOW (ref 33–55)
CD4 T Cell Abs: 130 uL — ABNORMAL LOW (ref 400–2700)

## 2012-04-04 ENCOUNTER — Ambulatory Visit: Payer: Medicare Other | Admitting: Internal Medicine

## 2012-04-10 ENCOUNTER — Encounter: Payer: Self-pay | Admitting: Internal Medicine

## 2012-04-10 ENCOUNTER — Ambulatory Visit (INDEPENDENT_AMBULATORY_CARE_PROVIDER_SITE_OTHER): Payer: Medicare Other | Admitting: Internal Medicine

## 2012-04-10 VITALS — BP 159/95 | HR 90 | Temp 98.3°F | Wt 163.0 lb

## 2012-04-10 DIAGNOSIS — Z23 Encounter for immunization: Secondary | ICD-10-CM

## 2012-04-10 DIAGNOSIS — F329 Major depressive disorder, single episode, unspecified: Secondary | ICD-10-CM

## 2012-04-10 DIAGNOSIS — F411 Generalized anxiety disorder: Secondary | ICD-10-CM

## 2012-04-10 DIAGNOSIS — B20 Human immunodeficiency virus [HIV] disease: Secondary | ICD-10-CM

## 2012-04-10 NOTE — Progress Notes (Signed)
Patient ID: Jordan Calderon, male   DOB: Oct 24, 1960, 51 y.o.   MRN: [id number]     [facility] for Infectious Disease  Patient Active Problem List  Diagnosis  . HIV DISEASE  . HEPATITIS C  . CANDIDAL ESOPHAGITIS  . ANXIETY  . SUBSTANCE ABUSE, MULTIPLE  . DEPRESSION  . UPPER RESPIRATORY INFECTION, ACUTE  . DENTAL CARIES  . ANAL FISSURE  . FRACTURE, RIB, LEFT  . Cigarette smoker    Patient's Medications  New Prescriptions   No medications on file  Previous Medications   ATAZANAVIR (REYATAZ) 300 MG CAPSULE    Take 1 capsule (300 mg total) by mouth daily with breakfast.   DAPSONE 100 MG TABLET    Take 1 tablet (100 mg total) by mouth daily.   LAMIVUDINE-ZIDOVUDINE (COMBIVIR) 150-300 MG PER TABLET    Take 1 tablet by mouth 2 (two) times daily.   LORAZEPAM (ATIVAN) 2 MG TABLET    Take 1 tablet (2 mg total) by mouth every 6 (six) hours as needed for anxiety.   NAPROXEN (NAPROSYN) 250 MG TABLET    Take 250 mg by mouth 2 (two) times daily as needed.     PAROXETINE (PAXIL) 10 MG TABLET    Take 2 tablets (20 mg total) by mouth every morning.   RITONAVIR (NORVIR) 100 MG TABS    Take 1 tablet (100 mg total) by mouth daily.   TENOFOVIR (VIREAD) 300 MG TABLET    Take 1 tablet (300 mg total) by mouth daily.  Modified Medications   No medications on file  Discontinued Medications   No medications on file    Subjective: Jordan Calderon is in for his routine visit. He continues to struggle with chronic, severe anxiety and depression. He is still unable to drive following his arrest for driving while under the influence of alcohol. He is subject to drug testing and states that he has quit drinking alcohol and smoking marijuana. He denies any other illicit drug use. He states that he has not restarted his HIV medications because he is worried that he will not be able to take them consistently. He wants to see a mental health counselor but finds it difficult since he is unable to drive.  Objective: Temp:  98.3 F (36.8 C) (10/22 1024) Temp src: Oral (10/22 1024) BP: 159/95 mmHg (10/22 1024) Pulse Rate: 90  (10/22 1024)  General: Very anxious as usual. Tearful during the exam. Oral: Clear Skin: No rash Lungs: Clear Cor: Regular S1 and S2 but no murmurs  Lab Results HIV 1 RNA Quant (copies/mL)  Date Value  03/22/2012 213086*  10/14/2011 7497*  07/25/2011 [id number]*     CD4 T Cell Abs (cmm)  Date Value  03/22/2012 130*  10/14/2011 180*  07/25/2011 140*     Assessment: Jordan Calderon severe anxiety and depression continues to make adherence with his antiretroviral regimen extremely difficult. I have gone over all of his medications with him and helped him fill out his pillbox. I will see if we can arrange followup here in one week with me. I will also see if we can have him see our mental health counselor.  Plan: 1. Restart HIV medications 2. Mental health counseling 3. Influenza vaccine 4. Followup here in one week   [name], MD [facility] for Infectious Disease [facility] [phone] pager   [phone] cell 04/10/2012, 10:45 AM

## 2012-04-17 ENCOUNTER — Ambulatory Visit: Payer: Medicare Other

## 2012-04-17 ENCOUNTER — Ambulatory Visit: Payer: Medicare Other | Admitting: Internal Medicine

## 2012-04-17 ENCOUNTER — Ambulatory Visit (INDEPENDENT_AMBULATORY_CARE_PROVIDER_SITE_OTHER): Payer: Medicare Other | Admitting: Internal Medicine

## 2012-04-17 VITALS — BP 135/91 | HR 87 | Temp 98.0°F | Wt 163.5 lb

## 2012-04-17 DIAGNOSIS — B20 Human immunodeficiency virus [HIV] disease: Secondary | ICD-10-CM

## 2012-04-17 DIAGNOSIS — F39 Unspecified mood [affective] disorder: Secondary | ICD-10-CM

## 2012-04-17 DIAGNOSIS — F411 Generalized anxiety disorder: Secondary | ICD-10-CM

## 2012-04-17 DIAGNOSIS — F329 Major depressive disorder, single episode, unspecified: Secondary | ICD-10-CM

## 2012-04-17 DIAGNOSIS — F4310 Post-traumatic stress disorder, unspecified: Secondary | ICD-10-CM
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
## 2012-04-17 NOTE — Progress Notes (Signed)
Patient ID: Jordan Calderon, male   DOB: [birth year], 50 y.o.   MRN: [id number]     [facility] for Infectious Disease  Patient Active Problem List  Diagnosis  . HIV DISEASE  . HEPATITIS C  . CANDIDAL ESOPHAGITIS  . ANXIETY  . SUBSTANCE ABUSE, MULTIPLE  . DEPRESSION  . UPPER RESPIRATORY INFECTION, ACUTE  . DENTAL CARIES  . ANAL FISSURE  . FRACTURE, RIB, LEFT  . Cigarette smoker    Patient's Medications  New Prescriptions   No medications on file  Previous Medications   ATAZANAVIR (REYATAZ) 300 MG CAPSULE    Take 1 capsule (300 mg total) by mouth daily with breakfast.   DAPSONE 100 MG TABLET    Take 1 tablet (100 mg total) by mouth daily.   LAMIVUDINE-ZIDOVUDINE (COMBIVIR) 150-300 MG PER TABLET    Take 1 tablet by mouth 2 (two) times daily.   LORAZEPAM (ATIVAN) 2 MG TABLET    Take 1 tablet (2 mg total) by mouth every 6 (six) hours as needed for anxiety.   NAPROXEN (NAPROSYN) 250 MG TABLET    Take 250 mg by mouth 2 (two) times daily as needed.     PAROXETINE (PAXIL) 10 MG TABLET    Take 2 tablets (20 mg total) by mouth every morning.   RITONAVIR (NORVIR) 100 MG TABS    Take 1 tablet (100 mg total) by mouth daily.   TENOFOVIR (VIREAD) 300 MG TABLET    Take 1 tablet (300 mg total) by mouth daily.  Modified Medications   No medications on file  Discontinued Medications   No medications on file    Subjective: Jordan Calderon is seen for his one-week followup visit after restarting antiretroviral therapy. He states that the pillbox he filled out last week has definitely helped and it's much easier to take his medications than he anticipated. He still remains fatigued but states that he has not had any difficulty tolerating his medications. He has not missed any doses despite working fairly hard at [employer] recently. He is scheduled to meet with our mental health counselor today.  Objective: Temp: 98 F (36.7 C) (10/29 1444) Temp src: Oral (10/29 1444) BP: 135/91 mmHg (10/29  1444) Pulse Rate: 87  (10/29 1444)  General: He appears to be in much better spirits Skin: No rash Lungs: Clear Cor: Regular S1 and S2 no murmurs  Lab Results HIV 1 RNA Quant (copies/mL)  Date Value  03/22/2012 [id number]*  10/14/2011 7497*  07/25/2011 [id number]*     CD4 T Cell Abs (cmm)  Date Value  03/22/2012 130*  10/14/2011 180*  07/25/2011 140*     Assessment: He is up to a good start after her beginning antiretroviral therapy last week. He has been able to fill out his pillbox correctly on his own yesterday. He will meet with [name], our mental health counselor today and follow up with me in 3 weeks.  Plan: 1. Continue current medications 2. Mental health counseling 3. Followup in 3 weeks   [name], MD [facility] for Infectious Disease [facility] [phone] pager   [phone] cell 04/17/2012, 2:51 PM

## 2012-04-20 ENCOUNTER — Other Ambulatory Visit: Payer: Self-pay | Admitting: *Deleted

## 2012-04-20 DIAGNOSIS — F419 Anxiety disorder, unspecified: Secondary | ICD-10-CM

## 2012-04-20 MED ORDER — LORAZEPAM 2 MG PO TABS: 2.0000 mg | ORAL_TABLET | Freq: Four times a day (QID) | ORAL | Status: DC | PRN

## 2012-04-24 ENCOUNTER — Ambulatory Visit: Payer: Medicare Other

## 2012-04-24 DIAGNOSIS — F411 Generalized anxiety disorder: Secondary | ICD-10-CM

## 2012-04-24 DIAGNOSIS — F4310 Post-traumatic stress disorder, unspecified: Secondary | ICD-10-CM

## 2012-04-24 DIAGNOSIS — F331 Major depressive disorder, recurrent, moderate: Secondary | ICD-10-CM

## 2012-04-24 NOTE — Progress Notes (Signed)
I met with Jordan Calderon again today and he reported that after his last appointment he was able to schedule his substance abuse assessment needed for his probation (related to a DWI).  He had been very nervous about doing this but said that after talking with me about it, felt he should go ahead and get it done.  He also said he has been trying the breathing technique and that it seems to help him with anxiety.  I provided psycho-education on cognitive behavioral therapy (CBT), explaining how thoughts affect feelings and he liked this very much.  He asked if he could take my diagram home, saying that he is a visual person and things like that help.  He also took a list of 'cognitive distortions' by Allyson Sabal.  Plan to meet in one week.

## 2012-05-01 ENCOUNTER — Ambulatory Visit: Payer: Medicare Other

## 2012-05-10 ENCOUNTER — Other Ambulatory Visit: Payer: Self-pay | Admitting: Licensed Clinical Social Worker

## 2012-05-10 ENCOUNTER — Ambulatory Visit: Payer: Medicare Other | Admitting: Internal Medicine

## 2012-05-10 DIAGNOSIS — F419 Anxiety disorder, unspecified: Secondary | ICD-10-CM

## 2012-05-10 MED ORDER — LORAZEPAM 2 MG PO TABS: 2.0000 mg | ORAL_TABLET | Freq: Four times a day (QID) | ORAL | Status: DC | PRN

## 2012-05-25 ENCOUNTER — Other Ambulatory Visit: Payer: Medicare Other

## 2012-06-21 ENCOUNTER — Encounter: Payer: Self-pay | Admitting: Internal Medicine

## 2012-06-21 ENCOUNTER — Ambulatory Visit (INDEPENDENT_AMBULATORY_CARE_PROVIDER_SITE_OTHER): Payer: Medicare Other | Admitting: Internal Medicine

## 2012-06-21 ENCOUNTER — Other Ambulatory Visit: Payer: Self-pay | Admitting: Internal Medicine

## 2012-06-21 VITALS — BP 138/94 | HR 76 | Temp 98.0°F | Ht 69.0 in | Wt 165.5 lb

## 2012-06-21 DIAGNOSIS — B20 Human immunodeficiency virus [HIV] disease: Secondary | ICD-10-CM

## 2012-06-21 DIAGNOSIS — Z79899 Other long term (current) drug therapy: Secondary | ICD-10-CM

## 2012-06-21 LAB — CBC
Hemoglobin: 16 g/dL (ref 13.0–17.0)
MCH: 35.4 pg — ABNORMAL HIGH (ref 26.0–34.0)
MCV: 98.2 fL (ref 78.0–100.0)
Platelets: 153 10*3/uL (ref 150–400)
RBC: 4.52 MIL/uL (ref 4.22–5.81)
WBC: 4.8 10*3/uL (ref 4.0–10.5)

## 2012-06-21 LAB — COMPREHENSIVE METABOLIC PANEL
ALT: 31 U/L (ref 0–53)
CO2: 27 mEq/L (ref 19–32)
Calcium: 9.2 mg/dL (ref 8.4–10.5)
Chloride: 103 mEq/L (ref 96–112)
Creat: 0.91 mg/dL (ref 0.50–1.35)
Glucose, Bld: 79 mg/dL (ref 70–99)
Total Protein: 7.9 g/dL (ref 6.0–8.3)

## 2012-06-21 LAB — LIPID PANEL
Cholesterol: 158 mg/dL (ref 0–200)
Total CHOL/HDL Ratio: 6.3 Ratio
Triglycerides: 185 mg/dL — ABNORMAL HIGH (ref <150)

## 2012-06-21 NOTE — Progress Notes (Signed)
Patient ID: Jordan Calderon, male   DOB: 1960-06-28, 52 y.o.   MRN: [id number]     [facility] for Infectious Disease  Patient Active Problem List  Diagnosis  . HIV DISEASE  . HEPATITIS C  . CANDIDAL ESOPHAGITIS  . ANXIETY  . SUBSTANCE ABUSE, MULTIPLE  . DEPRESSION  . UPPER RESPIRATORY INFECTION, ACUTE  . DENTAL CARIES  . ANAL FISSURE  . FRACTURE, RIB, LEFT  . Cigarette smoker    Patient's Medications  New Prescriptions   No medications on file  Previous Medications   ATAZANAVIR (REYATAZ) 300 MG CAPSULE    Take 1 capsule (300 mg total) by mouth daily with breakfast.   DAPSONE 100 MG TABLET    Take 1 tablet (100 mg total) by mouth daily.   LAMIVUDINE-ZIDOVUDINE (COMBIVIR) 150-300 MG PER TABLET    Take 1 tablet by mouth 2 (two) times daily.   LORAZEPAM (ATIVAN) 2 MG TABLET    Take 1 tablet (2 mg total) by mouth every 6 (six) hours as needed for anxiety.   NAPROXEN (NAPROSYN) 250 MG TABLET    Take 250 mg by mouth 2 (two) times daily as needed.     PAROXETINE (PAXIL) 10 MG TABLET    Take 2 tablets (20 mg total) by mouth every morning.   RITONAVIR (NORVIR) 100 MG TABS    Take 1 tablet (100 mg total) by mouth daily.   TENOFOVIR (VIREAD) 300 MG TABLET    Take 1 tablet (300 mg total) by mouth daily.  Modified Medications   No medications on file  Discontinued Medications   No medications on file    Subjective: Jordan Calderon is in for his routine visit. He is feeling better. He has been using his pillbox and believes his only missed one or 2 doses of his medications since his visit 6 weeks ago. That occurred when he felt like he was getting a cold and he was afraid that if he took his medications he might have nausea and vomiting and vomited his medication back up. He is still feeling chronically anxious and mildly depressed but did note that the cognitive behavioral therapy and breathing exercises that he learned from [facility] do seem to be helping. He had to miss his last counseling  session because he still does not have his driver's license back. He states that he should get the ability to drive again in about 3 weeks which will make it easier for him to keep regular appointments here.  Objective: Temp: 98 F (36.7 C) (01/02 1536) Temp src: Oral (01/02 1536) BP: 138/94 mmHg (01/02 1536) Pulse Rate: 76  (01/02 1536)  General: He is smiling in good spirits Skin: No rash Oral: Clear Lungs: Clear Cor: Regular S1 and S2 and no murmur His mood appears appropriate and he is not tearful today  Lab Results HIV 1 RNA Quant (copies/mL)  Date Value  03/22/2012 [id number]*  10/14/2011 7497*  07/25/2011 [id number]*     CD4 T Cell Abs (cmm)  Date Value  03/22/2012 130*  10/14/2011 180*  07/25/2011 140*     Assessment: He seemed to be doing much better with his adherence. I will check his CD4 and viral load again today.  His anxiety and chronic depression have also improved and he does plan on following up with his counselor once he regains the ability to drive in several weeks.  Plan: 1. Continue current medications 2. Check lab work today 3. Followup in 4 weeks   Jonny Ruiz  Orvan Falconer, MD [facility] for Infectious Disease [facility] [phone] pager   [phone] cell 06/21/2012, 4:04 PM

## 2012-06-22 LAB — T-HELPER CELL (CD4) - (RCID CLINIC ONLY)
CD4 % Helper T Cell: 8 % — ABNORMAL LOW (ref 33–55)
CD4 T Cell Abs: 140 uL — ABNORMAL LOW (ref 400–2700)

## 2012-06-25 ENCOUNTER — Other Ambulatory Visit: Payer: Self-pay | Admitting: Internal Medicine

## 2012-06-25 DIAGNOSIS — B20 Human immunodeficiency virus [HIV] disease: Secondary | ICD-10-CM

## 2012-06-26 NOTE — Addendum Note (Signed)
Addended by: Mariea Clonts D on: 06/26/2012 10:03 AM   Modules accepted: Orders

## 2012-06-28 LAB — HIV-1 GENOTYPR PLUS

## 2012-07-24 ENCOUNTER — Ambulatory Visit: Payer: Medicare Other | Admitting: Internal Medicine

## 2012-07-24 ENCOUNTER — Encounter: Payer: Self-pay | Admitting: Internal Medicine

## 2012-07-24 ENCOUNTER — Other Ambulatory Visit: Payer: Self-pay | Admitting: *Deleted

## 2012-07-24 VITALS — BP 145/97 | HR 89 | Temp 98.0°F | Wt 165.0 lb

## 2012-07-24 DIAGNOSIS — F329 Major depressive disorder, single episode, unspecified: Secondary | ICD-10-CM

## 2012-07-24 MED ORDER — CLONAZEPAM 0.5 MG PO TABS: 0.5000 mg | ORAL_TABLET | Freq: Two times a day (BID) | ORAL | Status: DC | PRN

## 2012-07-24 MED ORDER — PAROXETINE HCL 40 MG PO TABS: 40.0000 mg | ORAL_TABLET | Freq: Every day | ORAL | Status: DC

## 2012-07-24 NOTE — Progress Notes (Signed)
Patient ID: SURAFEL HILLEARY, male   DOB: 1960/10/31, 52 y.o.   MRN: [id number]     [facility] for Infectious Disease  Patient Active Problem List  Diagnosis  . HIV DISEASE  . HEPATITIS C  . CANDIDAL ESOPHAGITIS  . ANXIETY  . SUBSTANCE ABUSE, MULTIPLE  . DEPRESSION  . UPPER RESPIRATORY INFECTION, ACUTE  . DENTAL CARIES  . ANAL FISSURE  . FRACTURE, RIB, LEFT  . Cigarette smoker    Patient's Medications  New Prescriptions   No medications on file  Previous Medications   ATAZANAVIR (REYATAZ) 300 MG CAPSULE    Take 1 capsule (300 mg total) by mouth daily with breakfast.   DAPSONE 100 MG TABLET    Take 1 tablet (100 mg total) by mouth daily.   LAMIVUDINE-ZIDOVUDINE (COMBIVIR) 150-300 MG PER TABLET    Take 1 tablet by mouth 2 (two) times daily.   LORAZEPAM (ATIVAN) 2 MG TABLET    Take 1 tablet (2 mg total) by mouth every 6 (six) hours as needed for anxiety.   NAPROXEN (NAPROSYN) 250 MG TABLET    Take 250 mg by mouth 2 (two) times daily as needed.     PAROXETINE (PAXIL) 10 MG TABLET    Take 2 tablets (20 mg total) by mouth every morning.   RITONAVIR (NORVIR) 100 MG TABS    Take 1 tablet (100 mg total) by mouth daily.   TENOFOVIR (VIREAD) 300 MG TABLET    Take 1 tablet (300 mg total) by mouth daily.  Modified Medications   No medications on file  Discontinued Medications   No medications on file    Subjective: Jordan Calderon is in for his routine visit. He states that "I have not been totally truthful". He has not been taking any of his HIV medications. He states that he feels like he is probably still in denial. He does take his lorazepam and paroxetine because he can see a positive difference when he is on those medications. He states that he is worried that he will have nausea and vomiting when he starts his other medicines and that he will be forced to start smoking marijuana again to counteract the side effects.  He was just able to released from his parole officer over his DUI offense.  He still not able to drive and would have to take classes costing $400 and patent have a breathalyzer installed in his car before he would be allowed to drive again. This makes it difficult for him to followup here for counseling and regular medical visits.  Objective:    General: He is very anxious and tearful Skin: No rash Lungs: Clear Cor: Regular S1 and S2 no murmurs  Lab Results HIV 1 RNA Quant (copies/mL)  Date Value  06/21/2012 121115*  03/22/2012 [id number]*  10/14/2011 7497*     CD4 T Cell Abs (cmm)  Date Value  06/22/2012 140*  03/22/2012 130*  10/14/2011 180*     Assessment: Extreme chronic anxiety and depression continue to make it very difficult for him to adhere to his antiretroviral therapy. He is also concerned about potential side effects of his medications. I will have him speak to our pharmacist about potential side effects and will try to arrange transportation so that he can have a more regular mental health counseling. He had been working on cognitive behavioral therapy with her counselor last fall. Hopeful that he will be willing and able to restart antiretroviral therapy soon.  Plan: 1. Pharmacy counseling 2. Transportation  assistance if available 3. Reenter mental health counseling as soon as possible 4. Followup in one month   [name], MD [facility] for Infectious Disease [facility] [phone] pager   [phone] cell 07/24/2012, 10:36 AM   Addendum: After speaking with Ulyses Southward we have agreed to add a low-dose Klonopin during the day for his extreme anxiety and also increase the dose of his Paxil. We will also try to get him counseling with [facility] of the [place] closer to his home in [place]. We'll see him back in one month and consider switching him to a salvage regimen with a lower pill burden.  [name], MD [facility] for Infectious Disease [facility] [phone] pager   [phone] cell 07/24/2012,  11:23 AM

## 2012-07-26 ENCOUNTER — Other Ambulatory Visit: Payer: Self-pay | Admitting: Internal Medicine

## 2012-07-26 NOTE — Telephone Encounter (Signed)
Refill request from pt's pharmacy received for Ativan.  Pt seen 07/24/12 for OV w/ Dr. Orvan Falconer.  MD, please advise about Ativan refills w/ pt recently started on Klonopin.

## 2012-07-27 ENCOUNTER — Telehealth: Payer: Self-pay | Admitting: *Deleted

## 2012-07-27 NOTE — Telephone Encounter (Signed)
Patient called inquiring on status of lorazepam refill.  After chart review, unsure if lorazepam should be renewed. RN discussed current RX changes with patient -- clonazepam 0.5mg  PRN and paroxetine 40mg .  Patient satisfied with current regimen. Andree Coss, RN

## 2012-07-30 NOTE — Progress Notes (Signed)
HPI: Jordan Calderon is a 52 y.o. male with HIV who is here for follow up.  Allergies: Allergies  Allergen Reactions  . Sulfonamide Derivatives     REACTION: itching    Vitals:    Past Medical History: No past medical history on file.  Social History: History   Social History  . Marital Status: Single    Spouse Name: N/A    Number of Children: N/A  . Years of Education: N/A   Social History Main Topics  . Smoking status: Current Every Day Smoker -- 0.10 packs/day for 30 years    Types: Cigarettes  . Smokeless tobacco: Never Used     Comment: seems to be slowing down  . Alcohol Use: No  . Drug Use: No  . Sexually Active: No     Comment: declined condoms   Other Topics Concern  . None   Social History Narrative  . None    Home Medications:  (Not in a hospital admission)  Current Regimen: ATV/r + Combivir + TDF  Labs: HIV 1 RNA Quant (copies/mL)  Date Value  06/21/2012 121115*  03/22/2012 161096*  10/14/2011 7497*     CD4 T Cell Abs (cmm)  Date Value  06/22/2012 140*  03/22/2012 130*  10/14/2011 180*     Hep B S Ab (no units)  Date Value  08/14/2006 No      Hepatitis B Surface Ag (no units)  Date Value  08/14/2006 No      HCV Ab (no units)  Date Value  08/14/2006 No     CrCl: The CrCl is unknown because both a height and weight (above a minimum accepted value) are required for this calculation.  Lipids:    Component Value Date/Time   CHOL 158 06/21/2012 1622   TRIG 185* 06/21/2012 1622   HDL 25* 06/21/2012 1622   CHOLHDL 6.3 06/21/2012 1622   VLDL 37 06/21/2012 1622   LDLCALC 96 06/21/2012 1622    Assessment: 52 yo with longstanding HIV. He confessed to Dr. Orvan Falconer that he has not been taking his meds. Dr. Orvan Falconer asked me to speak to him about his meds issues. He has major anxiety issues. Several crying spells during visit. He stated that his anxiety is preventing him from taking his meds and that he feels that his regimen is too complicated. We  discussed several options with him. First, we need to get his anxiety under control. Since he is taking his lorazepam at night, he has nothing to control his anxiety during the day. Second, I asked him if he would take his HIV meds if we could simplify it to either 1 or 2 tablets a day. He stated that he would be more motivated to take it. He is not ready to restart his ARTs today since his anxiety is not under control.   Recommendations: 1. Use low dose Klonopin 0.5mg  qday to control AM symptoms 2. Increase Paxil to 40mg  qday 3. Advised him to take his dapsone to prevent PCP  Clide Cliff, PharmD Clinical Infectious Disease Pharmacist [facility] for Infectious Disease 07/30/2012, 9:00 PM

## 2012-08-21 ENCOUNTER — Ambulatory Visit: Payer: Medicare Other | Admitting: Internal Medicine

## 2012-09-06 ENCOUNTER — Other Ambulatory Visit: Payer: Self-pay | Admitting: Internal Medicine

## 2012-09-11 ENCOUNTER — Ambulatory Visit (INDEPENDENT_AMBULATORY_CARE_PROVIDER_SITE_OTHER): Payer: Medicare Other | Admitting: Internal Medicine

## 2012-09-11 ENCOUNTER — Encounter: Payer: Self-pay | Admitting: Internal Medicine

## 2012-09-11 VITALS — BP 125/83 | Temp 98.1°F | Ht 69.0 in | Wt 163.0 lb

## 2012-09-11 DIAGNOSIS — B20 Human immunodeficiency virus [HIV] disease: Secondary | ICD-10-CM

## 2012-09-11 MED ORDER — RITONAVIR 100 MG PO TABS: 100.0000 mg | ORAL_TABLET | Freq: Every day | ORAL | Status: DC

## 2012-09-11 MED ORDER — EMTRICITABINE-TENOFOVIR DF 200-300 MG PO TABS: 1.0000 | ORAL_TABLET | Freq: Every day | ORAL | Status: DC

## 2012-09-11 MED ORDER — DARUNAVIR ETHANOLATE 800 MG PO TABS: 800.0000 mg | ORAL_TABLET | Freq: Every day | ORAL | Status: DC

## 2012-09-11 NOTE — Progress Notes (Signed)
HPI: Jordan Calderon is a 52 y.o. male with long standing HIV who is here for follow up visit.   Allergies: Allergies  Allergen Reactions  . Sulfonamide Derivatives     REACTION: itching    Vitals: Temp: 98.1 F (36.7 C) (03/25 0953) Temp src: Oral (03/25 0953) BP: 125/83 mmHg (03/25 0953)  Past Medical History: No past medical history on file.  Social History: History   Social History  . Marital Status: Single    Spouse Name: N/A    Number of Children: N/A  . Years of Education: N/A   Social History Main Topics  . Smoking status: Current Every Day Smoker -- 0.20 packs/day for 30 years    Types: Cigarettes  . Smokeless tobacco: Never Used     Comment: seems to be slowing down, a pack a week  . Alcohol Use: No  . Drug Use: No  . Sexually Active: No     Comment: declined condoms   Other Topics Concern  . None   Social History Narrative  . None    Previous Regimen: ATV/r + combivir + TDF  Current Regimen: None  Labs: HIV 1 RNA Quant (copies/mL)  Date Value  06/21/2012 121115*  03/22/2012 [id number]*  10/14/2011 7497*     CD4 T Cell Abs (cmm)  Date Value  06/22/2012 140*  03/22/2012 130*  10/14/2011 180*     Hep B S Ab (no units)  Date Value  08/14/2006 No      Hepatitis B Surface Ag (no units)  Date Value  08/14/2006 No      HCV Ab (no units)  Date Value  08/14/2006 No     CrCl: Estimated Creatinine Clearance: 93.9 ml/min (by C-G formula based on Cr of 0.91).  Lipids:    Component Value Date/Time   CHOL 158 06/21/2012 1622   TRIG 185* 06/21/2012 1622   HDL 25* 06/21/2012 1622   CHOLHDL 6.3 06/21/2012 1622   VLDL 37 06/21/2012 1622   LDLCALC 96 06/21/2012 1622    Assessment: Pt was seen in clinic a few weeks ago with severe anxiety and depression. He remains off of his meds since then. He wasn't compliance then with his meds. We change around his meds to control his anxiety better. He is doing much better today. His anxiety symptoms are much improved.  He said the klonopin is doing a great job controlling his symptoms during the day without making too drowsy. He is ready to restart therapy. I think he is stable enough to restart his ART. We are going to simplify his regimen for him since he doesn't have extreme mutations. 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   Recommendations: Start DRV/r + Truvada F/u in a couple of weeks  Clide Cliff, PharmD Clinical Infectious Disease Pharmacist [facility] for Infectious Disease 09/11/2012, 3:08 PM

## 2012-09-11 NOTE — Progress Notes (Signed)
Patient ID: Jordan Calderon, male   DOB: June 01, 1961, 52 y.o.   MRN: [id number]          [facility] for Infectious Disease  Patient Active Problem List  Diagnosis  . HIV DISEASE  . HEPATITIS C  . CANDIDAL ESOPHAGITIS  . ANXIETY  . SUBSTANCE ABUSE, MULTIPLE  . DEPRESSION  . UPPER RESPIRATORY INFECTION, ACUTE  . DENTAL CARIES  . ANAL FISSURE  . FRACTURE, RIB, LEFT  . Cigarette smoker    Patient's Medications  New Prescriptions   No medications on file  Previous Medications   CLONAZEPAM (KLONOPIN) 0.5 MG TABLET    TAKE 1 TABLET BY MOUTH TWICE DAILY AS NEEDED FOR ANXIETY   DAPSONE 100 MG TABLET    Take 1 tablet (100 mg total) by mouth daily.   PAROXETINE (PAXIL) 40 MG TABLET    Take 1 tablet (40 mg total) by mouth daily.  Modified Medications   No medications on file  Discontinued Medications   ATAZANAVIR (REYATAZ) 300 MG CAPSULE    Take 1 capsule (300 mg total) by mouth daily with breakfast.   LAMIVUDINE-ZIDOVUDINE (COMBIVIR) 150-300 MG PER TABLET    Take 1 tablet by mouth 2 (two) times daily.   LORAZEPAM (ATIVAN) 2 MG TABLET    Take 1 tablet (2 mg total) by mouth every 6 (six) hours as needed for anxiety.   NAPROXEN (NAPROSYN) 250 MG TABLET    Take 250 mg by mouth 2 (two) times daily as needed.     RITONAVIR (NORVIR) 100 MG TABS    Take 1 tablet (100 mg total) by mouth daily.   TENOFOVIR (VIREAD) 300 MG TABLET    Take 1 tablet (300 mg total) by mouth daily.    Subjective: Moiz is in for his routine visit. He is feeling better since starting clonazepam and increasing his Paxil. He is less anxious and less depressed. He says he is very busy at work and has not been able to establish counseling and [place] yet but he is interested. He feels like he is ready to start a new HIV regimen. He is still working on clearing out all of his remaining a DUI charges and requirements.  Objective: Temp: 98.1 F (36.7 C) (03/25 0953) Temp src: Oral (03/25 0953) BP: 125/83 mmHg  (03/25 0953)  General: he is smiling and only mildly anxious  Skin: no rash Lungs: clear Cor: regular S1 and S2 no murmurs Abdomen: soft and nontender  Lab Results HIV 1 RNA Quant (copies/mL)  Date Value  06/21/2012 121115*  03/22/2012 696295*  10/14/2011 7497*     CD4 T Cell Abs (cmm)  Date Value  06/22/2012 140*  03/22/2012 130*  10/14/2011 180*     BMET    Component Value Date/Time   NA 136 06/21/2012 1622   K 4.0 06/21/2012 1622   CL 103 06/21/2012 1622   CO2 27 06/21/2012 1622   GLUCOSE 79 06/21/2012 1622   BUN 18 06/21/2012 1622   CREATININE 0.91 06/21/2012 1622   CREATININE 0.90 12/15/2009 2017   CALCIUM 9.2 06/21/2012 1622   Lab Results  Component Value Date   CHOL 158 06/21/2012   HDL 25* 06/21/2012   LDLCALC 96 06/21/2012   TRIG 185* 06/21/2012   CHOLHDL 6.3 06/21/2012   Assessment: His anxiety and depression are under better control. I reviewed all of his past resistance mutations. I will start him on a once daily, simplified salvage regimen of Truvada, Prezista and Norvir.  Plan: 1. Start Truvada, Prezista and Norvir 2. Continue clonazepam and Paxil 3. He is given information again about how to establish mental health counseling with [facility] [place] in [place] 4. Follow up after blood work in 6 weeks   [name], MD [facility] for Infectious Disease [facility] [phone] pager   [phone] cell 09/11/2012, 10:03 AM

## 2012-09-13 ENCOUNTER — Encounter: Payer: Self-pay | Admitting: Internal Medicine

## 2012-10-15 ENCOUNTER — Other Ambulatory Visit: Payer: Self-pay | Admitting: Internal Medicine

## 2012-10-19 ENCOUNTER — Other Ambulatory Visit: Payer: Self-pay | Admitting: Internal Medicine

## 2012-10-22 ENCOUNTER — Other Ambulatory Visit: Payer: Medicare Other

## 2012-10-24 ENCOUNTER — Other Ambulatory Visit: Payer: Medicare Other

## 2012-10-31 ENCOUNTER — Other Ambulatory Visit: Payer: Medicare Other

## 2012-11-06 ENCOUNTER — Ambulatory Visit (INDEPENDENT_AMBULATORY_CARE_PROVIDER_SITE_OTHER): Payer: Medicare Other | Admitting: Internal Medicine

## 2012-11-06 ENCOUNTER — Encounter: Payer: Self-pay | Admitting: Internal Medicine

## 2012-11-06 VITALS — BP 143/93 | HR 72 | Temp 98.1°F | Ht 69.0 in | Wt 165.5 lb

## 2012-11-06 DIAGNOSIS — B20 Human immunodeficiency virus [HIV] disease: Secondary | ICD-10-CM

## 2012-11-06 LAB — COMPREHENSIVE METABOLIC PANEL
Albumin: 4.4 g/dL (ref 3.5–5.2)
Alkaline Phosphatase: 68 U/L (ref 39–117)
BUN: 21 mg/dL (ref 6–23)
Glucose, Bld: 93 mg/dL (ref 70–99)
Potassium: 4.3 mEq/L (ref 3.5–5.3)

## 2012-11-06 LAB — CBC
HCT: 44.2 % (ref 39.0–52.0)
Hemoglobin: 15.5 g/dL (ref 13.0–17.0)
MCH: 33.4 pg (ref 26.0–34.0)
MCHC: 35.1 g/dL (ref 30.0–36.0)

## 2012-11-06 NOTE — Progress Notes (Signed)
Patient ID: Jordan Calderon, male   DOB: 10-15-1960, 52 y.o.   MRN: [id number]          [facility] for Infectious Disease  Patient Active Problem List   Diagnosis Date Noted  . HIV DISEASE 04/06/2006    Priority: High  . Cigarette smoker 07/29/2008    Priority: Medium  . ANXIETY 04/06/2006    Priority: Medium  . SUBSTANCE ABUSE, MULTIPLE 04/06/2006    Priority: Medium  . DEPRESSION 04/06/2006    Priority: Medium  . UPPER RESPIRATORY INFECTION, ACUTE 07/28/2009  . FRACTURE, RIB, LEFT 07/28/2009  . HEPATITIS C 04/06/2006  . CANDIDAL ESOPHAGITIS 04/06/2006  . DENTAL CARIES 04/06/2006  . ANAL FISSURE 04/06/2006    Patient's Medications  New Prescriptions   No medications on file  Previous Medications   CLONAZEPAM (KLONOPIN) 0.5 MG TABLET    TAKE 1 TABLET BY MOUTH TWICE DAILY AS NEEDED FOR ANXIETY   DAPSONE 100 MG TABLET    TAKE ONE TABLET BY MOUTH ONE TIME DAILY   DARUNAVIR ETHANOLATE (PREZISTA) 800 MG TABLET    Take 1 tablet (800 mg total) by mouth daily with breakfast.   EMTRICITABINE-TENOFOVIR (TRUVADA) 200-300 MG PER TABLET    Take 1 tablet by mouth daily.   PAROXETINE (PAXIL) 40 MG TABLET    Take 1 tablet (40 mg total) by mouth daily.   RITONAVIR (NORVIR) 100 MG TABS    Take 1 tablet (100 mg total) by mouth daily.  Modified Medications   No medications on file  Discontinued Medications   No medications on file    Subjective: Jordan Calderon is in for his routine visit. He started his new antiretroviral regimen of Truvada, Prezista and Norvir year about 6 weeks ago. He has had no problems tolerating his medications. He tried using a pillbox but found it cumbersome and now puts his full days a doses and an empty pill bottle that he can take to work. He thinks he is only missed 2 days worth of medications since he started them.  He is feeling much better. He says that he has not felt depressed since his last visit in his chronic anxiety is under much better control with  Klonopin. He is now through with all of his classes related to his DUI conviction and it no longer has to meet with his parole officer. He feels that he can now have enough time to enter her mental health counseling at [facility].  He continues to smoke several cigarettes daily but wants to quit. He is hoping he can convince his roommate to quit as well.  Review of Systems: Constitutional: negative Ears, nose, mouth, throat, and face: negative Respiratory: negative Cardiovascular: negative Gastrointestinal: negative Genitourinary:negative Integument/breast: negative  No past medical history on file.  History  Substance Use Topics  . Smoking status: Current Every Day Smoker -- 0.20 packs/day for 30 years    Types: Cigarettes  . Smokeless tobacco: Never Used     Comment: seems to be slowing down, a pack a week  . Alcohol Use: No    No family history on file.  Allergies  Allergen Reactions  . Sulfonamide Derivatives     REACTION: itching    Objective: Temp: 98.1 F (36.7 C) (05/20 1016) Temp src: Oral (05/20 1016) BP: 143/93 mmHg (05/20 1016) Pulse Rate: 72 (05/20 1016)  General: He is in very good spirits and is not tearful or anxious during the exam his usual Oral: no  oropharyngeal lesions in teeth in good condition Skin: no rash Lungs: clear Cor: regular S1 and S2 with no murmurs Abdomen: soft and nontender Joints and extremities: normal Neuro: alert and oriented  Lab Results HIV 1 RNA Quant (copies/mL)  Date Value  06/21/2012 121115*  03/22/2012 161096*  10/14/2011 7497*     CD4 T Cell Abs (cmm)  Date Value  06/22/2012 140*  03/22/2012 130*  10/14/2011 180*     Assessment: He is often a very good start with his new antiretroviral regimen. I will continue it and check a full set of lab work today and see him back in one month. He received adherence counseling for myself and our pharmacist, Ulyses Southward.  His chronic anxiety is under much  better control with Klonopin. He also does not seem depressed currently. However I did encourage him to in her mental health counseling within the next month and he agrees.  Encouraged him to quit smoking cigarettes and have given him written information about the [place] quit line.  Plan: 1. Continue current medications 2. Check lab work today 3. Enter mental health counseling 4. Cigarette cessation counseling provided in written information about the [place] quit line and provided 5. followup in one month   [name], MD [facility] for Infectious Disease [facility] [phone] pager   [phone] cell 11/06/2012, 10:29 AM

## 2012-11-07 LAB — T-HELPER CELL (CD4) - (RCID CLINIC ONLY): CD4 % Helper T Cell: 12 % — ABNORMAL LOW (ref 33–55)

## 2012-11-12 NOTE — Progress Notes (Signed)
HPI: Jordan Calderon is a 52 y.o. male who is here for his routine visit.  Allergies: Allergies  Allergen Reactions  . Sulfonamide Derivatives     REACTION: itching    Vitals:    Past Medical History: No past medical history on file.  Social History: History   Social History  . Marital Status: Single    Spouse Name: N/A    Number of Children: N/A  . Years of Education: N/A   Social History Main Topics  . Smoking status: Current Every Day Smoker -- 0.20 packs/day for 30 years    Types: Cigarettes  . Smokeless tobacco: Never Used     Comment: seems to be slowing down, a pack a week  . Alcohol Use: No  . Drug Use: No  . Sexually Active: No     Comment: declined condoms   Other Topics Concern  . None   Social History Narrative  . None    Previous Regimen: ATV/r + combivir + TDF  Current Regimen: DRV/r + Truvada  Labs: HIV 1 RNA Quant (copies/mL)  Date Value  11/06/2012 948*  06/21/2012 782956*  03/22/2012 213086*     CD4 T Cell Abs (cmm)  Date Value  11/06/2012 240*  06/22/2012 140*  03/22/2012 130*     Hep B S Ab (no units)  Date Value  08/14/2006 No      Hepatitis B Surface Ag (no units)  Date Value  08/14/2006 No      HCV Ab (no units)  Date Value  08/14/2006 No     CrCl: Estimated Creatinine Clearance: 93.9 ml/min (by C-G formula based on Cr of 0.91).  Lipids:    Component Value Date/Time   CHOL 158 06/21/2012 1622   TRIG 185* 06/21/2012 1622   HDL 25* 06/21/2012 1622   CHOLHDL 6.3 06/21/2012 1622   VLDL 37 06/21/2012 1622   LDLCALC 96 06/21/2012 1622    Assessment: Jordan Calderon is doing really well. His depression and anxiety is very well controlled. He has been taking his medications consistently. Instead of using a pillbox, he put all of his daily dose of medication in an empty prescription bottle. It seems that it's working well for him. He is anxiously awaiting for his labs results. I congratulated him on taking his meds appropriately.    Recommendations: Cont DRV/r + Truvada Cont paxil and klonopin  Clide Cliff, PharmD Clinical Infectious Disease Pharmacist [facility] 11/12/2012, 10:45 PM

## 2012-12-04 ENCOUNTER — Ambulatory Visit: Payer: Medicare Other | Admitting: Internal Medicine

## 2012-12-04 ENCOUNTER — Telehealth: Payer: Self-pay | Admitting: *Deleted

## 2012-12-04 NOTE — Telephone Encounter (Signed)
Non-working phone.  Sent pt MyChart message to call for a new appt.

## 2013-04-25 ENCOUNTER — Other Ambulatory Visit: Payer: Self-pay

## 2013-05-09 ENCOUNTER — Ambulatory Visit: Payer: Medicare Other | Admitting: Internal Medicine

## 2013-05-09 ENCOUNTER — Telehealth: Payer: Self-pay | Admitting: *Deleted

## 2013-05-09 NOTE — Telephone Encounter (Signed)
Attempted to call patient to confirm appointment, then to notify of missed appointment.  Unable to leave a message. Patient is detectable and out of care. Will refer to [facility]. Andree Coss, RN

## 2013-05-24 ENCOUNTER — Emergency Department (HOSPITAL_BASED_OUTPATIENT_CLINIC_OR_DEPARTMENT_OTHER)
Admission: EM | Admit: 2013-05-24 | Discharge: 2013-05-24 | Disposition: A | Discharge status: Home / Self Care | Payer: Medicare Other | Attending: Emergency Medicine | Admitting: Emergency Medicine

## 2013-05-24 ENCOUNTER — Encounter (HOSPITAL_BASED_OUTPATIENT_CLINIC_OR_DEPARTMENT_OTHER): Payer: Self-pay | Admitting: Emergency Medicine

## 2013-05-24 DIAGNOSIS — M545 Low back pain, unspecified: Secondary | ICD-10-CM | POA: Insufficient documentation

## 2013-05-24 DIAGNOSIS — F17200 Nicotine dependence, unspecified, uncomplicated: Secondary | ICD-10-CM | POA: Insufficient documentation

## 2013-05-24 DIAGNOSIS — M549 Dorsalgia, unspecified: Secondary | ICD-10-CM

## 2013-05-24 DIAGNOSIS — Z79899 Other long term (current) drug therapy: Secondary | ICD-10-CM | POA: Insufficient documentation

## 2013-05-24 DIAGNOSIS — Z21 Asymptomatic human immunodeficiency virus [HIV] infection status: Secondary | ICD-10-CM | POA: Insufficient documentation

## 2013-05-24 HISTORY — DX: Asymptomatic human immunodeficiency virus (hiv) infection status: Z21

## 2013-05-24 HISTORY — DX: Human immunodeficiency virus (HIV) disease: B20

## 2013-05-24 LAB — URINALYSIS, ROUTINE W REFLEX MICROSCOPIC
Ketones, ur: 15 mg/dL — AB
Leukocytes, UA: NEGATIVE
Nitrite: NEGATIVE
Protein, ur: NEGATIVE mg/dL
Urobilinogen, UA: 0.2 mg/dL (ref 0.0–1.0)
pH: 6.5 (ref 5.0–8.0)

## 2013-05-24 MED ORDER — CYCLOBENZAPRINE HCL 5 MG PO TABS: 5.0000 mg | ORAL_TABLET | Freq: Two times a day (BID) | ORAL | Status: DC | PRN

## 2013-05-24 MED ORDER — HYDROCODONE-ACETAMINOPHEN 5-325 MG PO TABS: 1.0000 | ORAL_TABLET | ORAL | Status: DC | PRN

## 2013-05-24 MED ORDER — CYCLOBENZAPRINE HCL 10 MG PO TABS
5.0000 mg | ORAL_TABLET | Freq: Once | ORAL | Status: AC
  Administered 2013-05-24: 5 mg via ORAL
  Filled 2013-05-24: qty 1

## 2013-05-24 MED ORDER — HYDROCODONE-ACETAMINOPHEN 5-325 MG PO TABS
1.0000 | ORAL_TABLET | Freq: Once | ORAL | Status: AC
  Administered 2013-05-24: 1 via ORAL
  Filled 2013-05-24: qty 1

## 2013-05-24 NOTE — ED Notes (Signed)
Pt c/o right low back pain since Tuesday. Pt sts he was lifting something heavy last week. Pt tearful in triage.

## 2013-05-24 NOTE — ED Provider Notes (Signed)
Medical screening examination/treatment/procedure(s) were performed by non-physician practitioner and as supervising physician I was immediately available for consultation/collaboration.  EKG Interpretation   None         Kerah Hardebeck, MD 05/24/13 2239 

## 2013-05-24 NOTE — ED Provider Notes (Signed)
CSN: [id number]     Arrival date & time 05/24/13  1507 History   First MD Initiated Contact with Patient 05/24/13 1518     Chief Complaint  Patient presents with  . Back Pain   (Consider location/radiation/quality/duration/timing/severity/associated sxs/prior Treatment) HPI Comments: Pt states that he did some heavy lifting last week and his right lower back started hurting:no fever, dsyuria, numbness or weakness:pt states that he has a history of back pain:no history of iv drug use:pt has used ibuprofen without releif  The history is provided by the patient. No language interpreter was used.    Past Medical History  Diagnosis Date  . HIV (human immunodeficiency virus infection)    Past Surgical History  Procedure Laterality Date  . Arm surgery     No family history on file. History  Substance Use Topics  . Smoking status: Current Every Day Smoker -- 0.20 packs/day for 30 years    Types: Cigarettes  . Smokeless tobacco: Never Used     Comment: seems to be slowing down, a pack a week  . Alcohol Use: No    Review of Systems  Constitutional: Negative.   Respiratory: Negative.   Cardiovascular: Negative.     Allergies  Sulfonamide derivatives  Home Medications   Current Outpatient Rx  Name  Route  Sig  Dispense  Refill  . clonazePAM (KLONOPIN) 0.5 MG tablet      TAKE 1 TABLET BY MOUTH TWICE DAILY AS NEEDED FOR ANXIETY   60 tablet   0   . dapsone 100 MG tablet      TAKE ONE TABLET BY MOUTH ONE TIME DAILY   30 tablet   0   . Darunavir Ethanolate (PREZISTA) 800 MG tablet   Oral   Take 1 tablet (800 mg total) by mouth daily with breakfast.   30 tablet   11     Combivir, Viread, and Reyataz are being discontinu ...   . emtricitabine-tenofovir (TRUVADA) 200-300 MG per tablet   Oral   Take 1 tablet by mouth daily.   30 tablet   11   . PARoxetine (PAXIL) 40 MG tablet   Oral   Take 1 tablet (40 mg total) by mouth daily.   30 tablet   11   . ritonavir  (NORVIR) 100 MG TABS   Oral   Take 1 tablet (100 mg total) by mouth daily.   30 tablet   11    BP 118/96  Pulse 72  Temp(Src) 98.2 F (36.8 C) (Oral)  Resp 18  Ht 5\' 9"  (1.753 m)  Wt 155 lb (70.308 kg)  BMI 22.88 kg/m2  SpO2 100% Physical Exam  Nursing note and vitals reviewed. Constitutional: He is oriented to person, place, and time. He appears well-developed and well-nourished.  Cardiovascular: Normal rate and regular rhythm.   Pulmonary/Chest: Effort normal and breath sounds normal.  Abdominal: Soft. Bowel sounds are normal. There is no tenderness.  Musculoskeletal:  Right lumbar paraspinal tenderness:pt moving all extremities with out any problem  Neurological: He is alert and oriented to person, place, and time.  Skin: Skin is warm and dry.    ED Course  Procedures (including critical care time) Labs Review Labs Reviewed  URINALYSIS, ROUTINE W REFLEX MICROSCOPIC - Abnormal; Notable for the following:    Ketones, ur 15 (*)    All other components within normal limits   Imaging Review No results found.  EKG Interpretation   None       MDM  1. Back pain    Will treat symptomatically:don't think any imaging is needed a this time:pt urine is clean:pt not having any red flag symptoms:no history of drug use    Teressa Lower, NP 05/24/13 2059

## 2013-06-04 ENCOUNTER — Ambulatory Visit (INDEPENDENT_AMBULATORY_CARE_PROVIDER_SITE_OTHER): Payer: Medicare Other | Admitting: Internal Medicine

## 2013-06-04 ENCOUNTER — Encounter: Payer: Self-pay | Admitting: Internal Medicine

## 2013-06-04 ENCOUNTER — Encounter: Payer: Self-pay | Admitting: *Deleted

## 2013-06-04 VITALS — BP 123/82 | HR 111 | Temp 99.3°F | Ht 69.0 in | Wt 151.2 lb

## 2013-06-04 DIAGNOSIS — F329 Major depressive disorder, single episode, unspecified: Secondary | ICD-10-CM

## 2013-06-04 DIAGNOSIS — Z23 Encounter for immunization: Secondary | ICD-10-CM

## 2013-06-04 DIAGNOSIS — B20 Human immunodeficiency virus [HIV] disease: Secondary | ICD-10-CM

## 2013-06-04 MED ORDER — RITONAVIR 100 MG PO TABS: 100.0000 mg | ORAL_TABLET | Freq: Every day | ORAL | Status: DC

## 2013-06-04 MED ORDER — EMTRICITABINE-TENOFOVIR DF 200-300 MG PO TABS: 1.0000 | ORAL_TABLET | Freq: Every day | ORAL | Status: DC

## 2013-06-04 MED ORDER — PAROXETINE HCL 40 MG PO TABS: 40.0000 mg | ORAL_TABLET | Freq: Every day | ORAL | Status: DC

## 2013-06-04 MED ORDER — DARUNAVIR ETHANOLATE 800 MG PO TABS: 800.0000 mg | ORAL_TABLET | Freq: Every day | ORAL | Status: DC

## 2013-06-04 MED ORDER — DAPSONE 100 MG PO TABS: 100.0000 mg | ORAL_TABLET | Freq: Every day | ORAL | Status: DC

## 2013-06-04 MED ORDER — CLONAZEPAM 0.5 MG PO TABS: 0.5000 mg | ORAL_TABLET | Freq: Two times a day (BID) | ORAL | Status: DC

## 2013-06-04 NOTE — Progress Notes (Signed)
  [facility] - Pharmacist    HPI: Jordan Calderon is a 52 y.o. male here for a follow-up visit.  He has been off of his medications for a period of time and is encountering some personal difficulties currently.  Allergies: Allergies  Allergen Reactions  . Sulfonamide Derivatives     REACTION: itching    Vitals: Temp: 99.3 F (37.4 C) (12/16 0928) Temp src: Oral (12/16 0928) BP: 123/82 mmHg (12/16 0928) Pulse Rate: 111 (12/16 4010)  Past Medical History: Past Medical History  Diagnosis Date  . HIV (human immunodeficiency virus infection)     Social History: History   Social History  . Marital Status: Single    Spouse Name: N/A    Number of Children: N/A  . Years of Education: N/A   Social History Main Topics  . Smoking status: Current Every Day Smoker -- 0.20 packs/day for 30 years    Types: Cigarettes  . Smokeless tobacco: Never Used     Comment: seems to be slowing down, a pack a week  . Alcohol Use: No  . Drug Use: No  . Sexual Activity: No     Comment: declined condoms   Other Topics Concern  . None   Social History Narrative  . None    Current Regimen: Darunavir/ritonavir, Truvada - has not been taking since this spring.  Labs: HIV 1 RNA Quant (copies/mL)  Date Value  11/06/2012 948*  06/21/2012 272536*  03/22/2012 644034*     CD4 T Cell Abs (cmm)  Date Value  11/06/2012 240*  06/22/2012 140*  03/22/2012 130*     Hep B S Ab (no units)  Date Value  08/14/2006 No      Hepatitis B Surface Ag (no units)  Date Value  08/14/2006 No      HCV Ab (no units)  Date Value  08/14/2006 No     CrCl: Estimated Creatinine Clearance: 91.1 ml/min (by C-G formula based on Cr of 0.91).  Lipids:    Component Value Date/Time   CHOL 158 06/21/2012 1622   TRIG 185* 06/21/2012 1622   HDL 25* 06/21/2012 1622   CHOLHDL 6.3 06/21/2012 1622   VLDL 37 06/21/2012 1622   LDLCALC 96 06/21/2012 1622    Assessment: Jordan Calderon states he is committed  to restarting therapy and understands the implications of not taking his medications.  He is quite anxious and has several significant life stressors.  He uses an empty pill bottle as a daily transport for his medications.  This bottle contains the correct medications for one day's therapy.    Recommendations: Dr. Orvan Falconer is restarting his anxiety medications. Jordan Calderon agrees to restart his HIV medications.  He prefers to continue to use his method of an empty pill bottle to take one day's worth of medications to work each day.   Medication adherence counseling performed with good response.   Will follow-up at his next visit for adherence  Sallee Provencal, Pharm.D., BCPS, AAHIVP Clinical Infectious Disease Pharmacist [facility] 06/04/2013, 10:07 AM

## 2013-06-04 NOTE — Progress Notes (Signed)
Patient ID: Jordan Calderon, male   DOB: Oct 28, 1960, 52 y.o.   MRN: [id number]          [facility] for Infectious Disease  Patient Active Problem List   Diagnosis Date Noted  . HIV DISEASE 04/06/2006    Priority: High  . Cigarette smoker 07/29/2008    Priority: Medium  . ANXIETY 04/06/2006    Priority: Medium  . SUBSTANCE ABUSE, MULTIPLE 04/06/2006    Priority: Medium  . DEPRESSION 04/06/2006    Priority: Medium  . UPPER RESPIRATORY INFECTION, ACUTE 07/28/2009  . FRACTURE, RIB, LEFT 07/28/2009  . HEPATITIS C 04/06/2006  . CANDIDAL ESOPHAGITIS 04/06/2006  . DENTAL CARIES 04/06/2006  . ANAL FISSURE 04/06/2006    Patient's Medications  New Prescriptions   No medications on file  Previous Medications   No medications on file  Modified Medications   Modified Medication Previous Medication   CLONAZEPAM (KLONOPIN) 0.5 MG TABLET clonazePAM (KLONOPIN) 0.5 MG tablet      Take 1 tablet (0.5 mg total) by mouth 2 (two) times daily.    TAKE 1 TABLET BY MOUTH TWICE DAILY AS NEEDED FOR ANXIETY   DAPSONE 100 MG TABLET dapsone 100 MG tablet      Take 1 tablet (100 mg total) by mouth daily.    TAKE ONE TABLET BY MOUTH ONE TIME DAILY   DARUNAVIR ETHANOLATE (PREZISTA) 800 MG TABLET Darunavir Ethanolate (PREZISTA) 800 MG tablet      Take 1 tablet (800 mg total) by mouth daily with breakfast.    Take 1 tablet (800 mg total) by mouth daily with breakfast.   EMTRICITABINE-TENOFOVIR (TRUVADA) 200-300 MG PER TABLET emtricitabine-tenofovir (TRUVADA) 200-300 MG per tablet      Take 1 tablet by mouth daily.    Take 1 tablet by mouth daily.   PAROXETINE (PAXIL) 40 MG TABLET PARoxetine (PAXIL) 40 MG tablet      Take 1 tablet (40 mg total) by mouth daily.    Take 1 tablet (40 mg total) by mouth daily.   RITONAVIR (NORVIR) 100 MG TABS TABLET ritonavir (NORVIR) 100 MG TABS      Take 1 tablet (100 mg total) by mouth daily.    Take 1 tablet (100 mg total) by mouth daily.  Discontinued Medications   CYCLOBENZAPRINE (FLEXERIL) 5 MG TABLET    Take 1 tablet (5 mg total) by mouth 2 (two) times daily as needed for muscle spasms.   HYDROCODONE-ACETAMINOPHEN (NORCO/VICODIN) 5-325 MG PER TABLET    Take 1-2 tablets by mouth every 4 (four) hours as needed.    Subjective: Sajid is in for his routine followup visit. He says that his anxiety and depression have been much worse recently. Several months ago he made a decision to stop taking his Paxil and Klonopin. He stopped them both abruptly because he was concerned that they may be causing some mental cloudiness. He was struggling with his relationship with his boyfriend who is his roommate. He felt like he needed to ask him to move out and wanted a clear head. He did move out 2 months ago but they are still on speaking terms. He drove him here for his appointment today. He says that he realizes that he should not have stopped taking his medications. He is very eager to restart them. He started seeing Carlene Coria at [facility] in [facility] and feels like this will help him get back on track. He states that he cannot remember how long he took his Truvada, Prezista  and Norvir. He has his medication bottles with him in his antiretroviral medicines were filled on April 28 and his bottles are nearly full.  Review of Systems: Constitutional: positive for malaise, negative for anorexia, chills, fevers, sweats and weight loss Eyes: negative Ears, nose, mouth, throat, and face: negative Respiratory: negative Cardiovascular: negative Gastrointestinal: negative Genitourinary:negative Behavioral/Psych: positive for anxiety, depression and fatigue, negative for abusive relationship, aggressive behavior, excessive alcohol consumption and illegal drug usage  Past Medical History  Diagnosis Date  . HIV (human immunodeficiency virus infection)     History  Substance Use Topics  . Smoking status: Current Every Day Smoker -- 0.20 packs/day for 30 years    Types:  Cigarettes  . Smokeless tobacco: Never Used     Comment: seems to be slowing down, a pack a week  . Alcohol Use: No    No family history on file.  Allergies  Allergen Reactions  . Sulfonamide Derivatives     REACTION: itching    Objective: Temp: 99.3 F (37.4 C) (12/16 0928) Temp src: Oral (12/16 0928) BP: 123/82 mmHg (12/16 0928) Pulse Rate: 111 (12/16 0928)  General: He is tearful and anxious throughout the exam Oral: He is a slightly reddened area on his hard palate Skin: He has some dry flaky skin over his forehead and cheeks compatible with seborrhea Lungs: Clear Cor: Regular S1 and S2 no murmurs Abdomen: Soft and nontender Joints and extremities: Normal Neuro: Alert with normal speech and conversation Mood and affect: Anxious and depressed  Lab Results Lab Results  Component Value Date   WBC 3.8* 11/06/2012   HGB 15.5 11/06/2012   HCT 44.2 11/06/2012   MCV 95.3 11/06/2012   PLT 137* 11/06/2012    Lab Results  Component Value Date   CREATININE 0.91 11/06/2012   BUN 21 11/06/2012   NA 137 11/06/2012   K 4.3 11/06/2012   CL 106 11/06/2012   CO2 30 11/06/2012    Lab Results  Component Value Date   ALT 16 11/06/2012   AST 20 11/06/2012   ALKPHOS 68 11/06/2012   BILITOT 0.3 11/06/2012    Lab Results  Component Value Date   CHOL 158 06/21/2012   HDL 25* 06/21/2012   LDLCALC 96 06/21/2012   TRIG 185* 06/21/2012   CHOLHDL 6.3 06/21/2012    Lab Results HIV 1 RNA Quant (copies/mL)  Date Value  11/06/2012 948*  06/21/2012 121115*  03/22/2012 161096*     CD4 T Cell Abs (cmm)  Date Value  11/06/2012 240*  06/22/2012 140*  03/22/2012 130*     Assessment: Cosme continues to stroke old with anxiety, depression and poor adherence to his medical regimen. He is started seeing a counselor at [facility] in this probably will help. He recently lost his electrical power because of unpaid bills. I will see if we can get that reinstated. We'll refill his medications and see him back in 3  weeks.  Plan: 1. Restart antiretroviral regimen 2. Restart Paxil and Klonopin 3. Mental health counseling 4. Followup in 3 weeks   [name], MD [facility] for Infectious Disease [facility] [phone] pager   [phone] cell 06/04/2013, 12:24 PM

## 2013-06-09 ENCOUNTER — Emergency Department (HOSPITAL_BASED_OUTPATIENT_CLINIC_OR_DEPARTMENT_OTHER)
Admission: EM | Admit: 2013-06-09 | Discharge: 2013-06-09 | Disposition: A | Discharge status: Home / Self Care | Payer: Medicare Other | Attending: Emergency Medicine | Admitting: Emergency Medicine

## 2013-06-09 ENCOUNTER — Encounter (HOSPITAL_BASED_OUTPATIENT_CLINIC_OR_DEPARTMENT_OTHER): Payer: Self-pay | Admitting: Emergency Medicine

## 2013-06-09 DIAGNOSIS — K0500 Acute gingivitis, plaque induced: Secondary | ICD-10-CM

## 2013-06-09 DIAGNOSIS — Z79899 Other long term (current) drug therapy: Secondary | ICD-10-CM | POA: Insufficient documentation

## 2013-06-09 DIAGNOSIS — F17200 Nicotine dependence, unspecified, uncomplicated: Secondary | ICD-10-CM | POA: Insufficient documentation

## 2013-06-09 DIAGNOSIS — Z21 Asymptomatic human immunodeficiency virus [HIV] infection status: Secondary | ICD-10-CM | POA: Insufficient documentation

## 2013-06-09 MED ORDER — HYDROCODONE-ACETAMINOPHEN 5-325 MG PO TABS: 2.0000 | ORAL_TABLET | ORAL | Status: DC | PRN

## 2013-06-09 MED ORDER — PENICILLIN V POTASSIUM 500 MG PO TABS: 500.0000 mg | ORAL_TABLET | Freq: Four times a day (QID) | ORAL | Status: AC

## 2013-06-09 NOTE — ED Provider Notes (Signed)
CSN: [id number]     Arrival date & time 06/09/13  1351 History   First MD Initiated Contact with Patient 06/09/13 1406     Chief Complaint  Patient presents with  . Dental Pain   (Consider location/radiation/quality/duration/timing/severity/associated sxs/prior Treatment) HPI Comments: Patient with a history of HIV presents with a 2 to three-week history of worsening pain. Since right upper and lower gone. He's only able to drink liquids because of pain. He denies any known fevers. He denies any vomiting. He recently saw his infectious disease doctor who restarted his antiviral medications in mid December. He had not previously been taking them regularly. At that time, Dr. Orvan Falconer had set him up with a dental appointment but it's not until January. He states she's not able to take his antiviral medications due to the discomfort in his films. He currently denies taking any medications due to his discomfort.  Patient is a 52 y.o. male presenting with tooth pain.  Dental Pain Associated symptoms: no congestion, no fever, no headaches and no oral lesions     Past Medical History  Diagnosis Date  . HIV (human immunodeficiency virus infection)    Past Surgical History  Procedure Laterality Date  . Arm surgery     No family history on file. History  Substance Use Topics  . Smoking status: Current Every Day Smoker -- 0.20 packs/day for 30 years    Types: Cigarettes  . Smokeless tobacco: Never Used     Comment: seems to be slowing down, a pack a week  . Alcohol Use: No    Review of Systems  Constitutional: Negative for fever, chills, diaphoresis and fatigue.  HENT: Positive for dental problem. Negative for congestion, mouth sores, rhinorrhea and sneezing.   Eyes: Negative.   Respiratory: Negative for cough, chest tightness and shortness of breath.   Cardiovascular: Negative for chest pain and leg swelling.  Gastrointestinal: Negative for nausea, vomiting, abdominal pain, diarrhea and  blood in stool.  Genitourinary: Negative for frequency, hematuria, flank pain and difficulty urinating.  Musculoskeletal: Negative for arthralgias and back pain.  Skin: Negative for rash.  Neurological: Negative for dizziness, speech difficulty, weakness, numbness and headaches.    Allergies  Sulfonamide derivatives  Home Medications   Current Outpatient Rx  Name  Route  Sig  Dispense  Refill  . clonazePAM (KLONOPIN) 0.5 MG tablet   Oral   Take 1 tablet (0.5 mg total) by mouth 2 (two) times daily.   60 tablet   5   . dapsone 100 MG tablet   Oral   Take 1 tablet (100 mg total) by mouth daily.   30 tablet   11   . Darunavir Ethanolate (PREZISTA) 800 MG tablet   Oral   Take 1 tablet (800 mg total) by mouth daily with breakfast.   30 tablet   11     Combivir, Viread, and Reyataz are being discontinu ...   . emtricitabine-tenofovir (TRUVADA) 200-300 MG per tablet   Oral   Take 1 tablet by mouth daily.   30 tablet   11   . HYDROcodone-acetaminophen (NORCO/VICODIN) 5-325 MG per tablet   Oral   Take 2 tablets by mouth every 4 (four) hours as needed.   15 tablet   0   . PARoxetine (PAXIL) 40 MG tablet   Oral   Take 1 tablet (40 mg total) by mouth daily.   30 tablet   11   . penicillin v potassium (VEETID) 500 MG tablet  Oral   Take 1 tablet (500 mg total) by mouth 4 (four) times daily.   40 tablet   0   . ritonavir (NORVIR) 100 MG TABS tablet   Oral   Take 1 tablet (100 mg total) by mouth daily.   30 tablet   11    BP 130/89  Pulse 104  Temp(Src) 99.3 F (37.4 C) (Oral)  Resp 18  Wt 149 lb 8 oz (67.813 kg)  SpO2 97% Physical Exam  Constitutional: He is oriented to person, place, and time. He appears well-developed and well-nourished.  HENT:  Head: Normocephalic and atraumatic.  Patient has some erythema and mild edema of the upper and lower gums. They're primarily tender in the front part of the mouth. There's no specific tooth pain. There is no  drainage from the gums. There is no abscess is noted.  Eyes: Pupils are equal, round, and reactive to light.  Neck: Normal range of motion. Neck supple.  Cardiovascular: Normal rate, regular rhythm and normal heart sounds.   Pulmonary/Chest: Effort normal and breath sounds normal. No respiratory distress. He has no wheezes. He has no rales. He exhibits no tenderness.  Abdominal: Soft. Bowel sounds are normal. There is no tenderness. There is no rebound and no guarding.  Musculoskeletal: Normal range of motion. He exhibits no edema.  Lymphadenopathy:    He has no cervical adenopathy.  Neurological: He is alert and oriented to person, place, and time.  Skin: Skin is warm and dry. No rash noted.  Psychiatric: He has a normal mood and affect.    ED Course  Procedures (including critical care time) Labs Review Labs Reviewed - No data to display Imaging Review No results found.  EKG Interpretation   None       MDM   1. Gingivitis, acute    Will start pt on penicillin and Vicodin for pain. I also advised him to use antibacterial mouth wash. I encouraged him to followup with his dentist as to his possible. I encouraged him to start taking his anti-retroviral medications regularly.    Rolan Bucco, MD 06/09/13 [id number]

## 2013-06-09 NOTE — ED Notes (Signed)
Patient here with ongoing 3 weeks of dental and gum pain. Having to drink only liquids due to the pain. Gums tender to touch

## 2013-06-25 ENCOUNTER — Ambulatory Visit: Payer: Medicare Other | Admitting: Internal Medicine

## 2013-07-22 ENCOUNTER — Other Ambulatory Visit: Payer: Medicare Other

## 2013-08-06 ENCOUNTER — Ambulatory Visit: Payer: Medicare Other | Admitting: Internal Medicine

## 2013-08-17 ENCOUNTER — Emergency Department (HOSPITAL_BASED_OUTPATIENT_CLINIC_OR_DEPARTMENT_OTHER)
Admission: EM | Admit: 2013-08-17 | Discharge: 2013-08-17 | Disposition: A | Discharge status: Home / Self Care | Payer: Medicare Other | Attending: Emergency Medicine | Admitting: Emergency Medicine

## 2013-08-17 ENCOUNTER — Encounter (HOSPITAL_BASED_OUTPATIENT_CLINIC_OR_DEPARTMENT_OTHER): Payer: Self-pay | Admitting: Emergency Medicine

## 2013-08-17 DIAGNOSIS — Z79899 Other long term (current) drug therapy: Secondary | ICD-10-CM | POA: Insufficient documentation

## 2013-08-17 DIAGNOSIS — X500XXA Overexertion from strenuous movement or load, initial encounter: Secondary | ICD-10-CM | POA: Insufficient documentation

## 2013-08-17 DIAGNOSIS — R21 Rash and other nonspecific skin eruption: Secondary | ICD-10-CM | POA: Insufficient documentation

## 2013-08-17 DIAGNOSIS — S335XXA Sprain of ligaments of lumbar spine, initial encounter: Secondary | ICD-10-CM | POA: Insufficient documentation

## 2013-08-17 DIAGNOSIS — Y929 Unspecified place or not applicable: Secondary | ICD-10-CM | POA: Insufficient documentation

## 2013-08-17 DIAGNOSIS — S39012A Strain of muscle, fascia and tendon of lower back, initial encounter: Secondary | ICD-10-CM

## 2013-08-17 DIAGNOSIS — G8929 Other chronic pain: Secondary | ICD-10-CM | POA: Insufficient documentation

## 2013-08-17 DIAGNOSIS — Y939 Activity, unspecified: Secondary | ICD-10-CM | POA: Insufficient documentation

## 2013-08-17 DIAGNOSIS — F17200 Nicotine dependence, unspecified, uncomplicated: Secondary | ICD-10-CM | POA: Insufficient documentation

## 2013-08-17 DIAGNOSIS — Z21 Asymptomatic human immunodeficiency virus [HIV] infection status: Secondary | ICD-10-CM | POA: Insufficient documentation

## 2013-08-17 MED ORDER — IBUPROFEN 800 MG PO TABS: 800.0000 mg | ORAL_TABLET | Freq: Three times a day (TID) | ORAL | Status: DC

## 2013-08-17 MED ORDER — DIAZEPAM 5 MG PO TABS: 5.0000 mg | ORAL_TABLET | Freq: Four times a day (QID) | ORAL | Status: DC | PRN

## 2013-08-17 NOTE — ED Notes (Signed)
Patient has had chronic back pain, last night having lower back pain and noticed some bruising. Bruising to lower back.

## 2013-08-17 NOTE — ED Provider Notes (Signed)
CSN: [id number]     Arrival date & time 08/17/13  1125 History   First MD Initiated Contact with Patient 08/17/13 1135     Chief Complaint  Patient presents with  . Back Pain     (Consider location/radiation/quality/duration/timing/severity/associated sxs/prior Treatment) HPI Comments: Patient presents to the ER for evaluation of ongoing back pain. Patient reports that he injured himself 2 weeks ago he lifted an object. He has been having this pain ever since. He was seen once before her prescribed medications which he has run out of. Since then he has been using over-the-counter medications. Pain is sharp, constant, waxes and wanes. It worsens when he changes positions. He has not had radiation to the legs, numbness, tingling of the lower extremities. No change in bowel or bladder function. Patient reports that he was going to put an over-the-counter muscle cream unless they noticed a rash on his lower back.  Patient is a 53 y.o. male presenting with back pain.  Back Pain   Past Medical History  Diagnosis Date  . HIV (human immunodeficiency virus infection)    Past Surgical History  Procedure Laterality Date  . Arm surgery     No family history on file. History  Substance Use Topics  . Smoking status: Current Every Day Smoker -- 0.20 packs/day for 30 years    Types: Cigarettes  . Smokeless tobacco: Never Used     Comment: seems to be slowing down, a pack a week  . Alcohol Use: No    Review of Systems  Musculoskeletal: Positive for back pain.  Skin: Positive for rash.  All other systems reviewed and are negative.      Allergies  Sulfonamide derivatives  Home Medications   Current Outpatient Rx  Name  Route  Sig  Dispense  Refill  . clonazePAM (KLONOPIN) 0.5 MG tablet   Oral   Take 1 tablet (0.5 mg total) by mouth 2 (two) times daily.   60 tablet   5   . dapsone 100 MG tablet   Oral   Take 1 tablet (100 mg total) by mouth daily.   30 tablet   11   .  Darunavir Ethanolate (PREZISTA) 800 MG tablet   Oral   Take 1 tablet (800 mg total) by mouth daily with breakfast.   30 tablet   11     Combivir, Viread, and Reyataz are being discontinu ...   . emtricitabine-tenofovir (TRUVADA) 200-300 MG per tablet   Oral   Take 1 tablet by mouth daily.   30 tablet   11   . HYDROcodone-acetaminophen (NORCO/VICODIN) 5-325 MG per tablet   Oral   Take 2 tablets by mouth every 4 (four) hours as needed.   15 tablet   0   . PARoxetine (PAXIL) 40 MG tablet   Oral   Take 1 tablet (40 mg total) by mouth daily.   30 tablet   11   . ritonavir (NORVIR) 100 MG TABS tablet   Oral   Take 1 tablet (100 mg total) by mouth daily.   30 tablet   11    BP 130/74  Pulse 101  Temp(Src) 98.4 F (36.9 C) (Oral)  Resp 20  Ht 5\' 9"  (1.753 m)  Wt 155 lb (70.308 kg)  BMI 22.88 kg/m2  SpO2 100% Physical Exam  Constitutional: He is oriented to person, place, and time. He appears well-developed and well-nourished. No distress.  HENT:  Head: Normocephalic and atraumatic.  Right Ear: Hearing normal.  Left Ear: Hearing normal.  Nose: Nose normal.  Mouth/Throat: Oropharynx is clear and moist and mucous membranes are normal.  Eyes: Conjunctivae and EOM are normal. Pupils are equal, round, and reactive to light.  Neck: Normal range of motion. Neck supple.  Cardiovascular: Regular rhythm, S1 normal and S2 normal.  Exam reveals no gallop and no friction rub.   No murmur heard. Pulmonary/Chest: Effort normal and breath sounds normal. No respiratory distress. He exhibits no tenderness.  Abdominal: Soft. Normal appearance and bowel sounds are normal. There is no hepatosplenomegaly. There is no tenderness. There is no rebound, no guarding, no tenderness at McBurney's point and negative Murphy's sign. No hernia.  Musculoskeletal: Normal range of motion.       Lumbar back: He exhibits tenderness. He exhibits no bony tenderness.       Back:  Neurological: He is alert  and oriented to person, place, and time. He has normal strength and normal reflexes. No cranial nerve deficit or sensory deficit. Coordination normal. GCS eye subscore is 4. GCS verbal subscore is 5. GCS motor subscore is 6.  Skin: Skin is warm, dry and intact. No rash noted. No cyanosis.     Psychiatric: He has a normal mood and affect. His speech is normal and behavior is normal. Thought content normal.    ED Course  Procedures (including critical care time) Labs Review Labs Reviewed - No data to display Imaging Review No results found.   EKG Interpretation None      MDM   Final diagnoses:  None    Patient presents with persistent low back pain. He was experiencing soft tissue pain and lower back. No focal midline tenderness. This is suggestive of muscle strain with spasms. Cannot rule out lumbar disc disease, but does not have any concerning neurologic findings. He is followup with his doctor on Thursday of this week. Outpatient MRI can be performed if there's no improvement. Until then we'll treat with ibuprofen and Valium for spasms.  Rash is nonspecific. He has been using "hot/cold packs" on the area. It's not clear if this continues or secondary to this. This does not appear to be in acute thermal injury. It is not shingles. It does not appear to be infectious.   Gilda Creasehristopher J. Pollina, MD 08/17/13 1150

## 2013-08-17 NOTE — Discharge Instructions (Signed)
Back Pain, Adult Low back pain is very common. About 1 in 5 people have back pain.The cause of low back pain is rarely dangerous. The pain often gets better over time.About half of people with a sudden onset of back pain feel better in just 2 weeks. About 8 in 10 people feel better by 6 weeks.  CAUSES Some common causes of back pain include:  Strain of the muscles or ligaments supporting the spine.  Wear and tear (degeneration) of the spinal discs.  Arthritis.  Direct injury to the back. DIAGNOSIS Most of the time, the direct cause of low back pain is not known.However, back pain can be treated effectively even when the exact cause of the pain is unknown.Answering your caregiver's questions about your overall health and symptoms is one of the most accurate ways to make sure the cause of your pain is not dangerous. If your caregiver needs more information, he or she may order lab work or imaging tests (X-rays or MRIs).However, even if imaging tests show changes in your back, this usually does not require surgery. HOME CARE INSTRUCTIONS For many people, back pain returns.Since low back pain is rarely dangerous, it is often a condition that people can learn to manageon their own.   Remain active. It is stressful on the back to sit or stand in one place. Do not sit, drive, or stand in one place for more than 30 minutes at a time. Take short walks on level surfaces as soon as pain allows.Try to increase the length of time you walk each day.  Do not stay in bed.Resting more than 1 or 2 days can delay your recovery.  Do not avoid exercise or work.Your body is made to move.It is not dangerous to be active, even though your back may hurt.Your back will likely heal faster if you return to being active before your pain is gone.  Pay attention to your body when you bend and lift. Many people have less discomfortwhen lifting if they bend their knees, keep the load close to their bodies,and  avoid twisting. Often, the most comfortable positions are those that put less stress on your recovering back.  Find a comfortable position to sleep. Use a firm mattress and lie on your side with your knees slightly bent. If you lie on your back, put a pillow under your knees.  Only take over-the-counter or prescription medicines as directed by your caregiver. Over-the-counter medicines to reduce pain and inflammation are often the most helpful.Your caregiver may prescribe muscle relaxant drugs.These medicines help dull your pain so you can more quickly return to your normal activities and healthy exercise.  Put ice on the injured area.  Put ice in a plastic bag.  Place a towel between your skin and the bag.  Leave the ice on for 15-20 minutes, 03-04 times a day for the first 2 to 3 days. After that, ice and heat may be alternated to reduce pain and spasms.  Ask your caregiver about trying back exercises and gentle massage. This may be of some benefit.  Avoid feeling anxious or stressed.Stress increases muscle tension and can worsen back pain.It is important to recognize when you are anxious or stressed and learn ways to manage it.Exercise is a great option. SEEK MEDICAL CARE IF:  You have pain that is not relieved with rest or medicine.  You have pain that does not improve in 1 week.  You have new symptoms.  You are generally not feeling well. SEEK   IMMEDIATE MEDICAL CARE IF:   You have pain that radiates from your back into your legs.  You develop new bowel or bladder control problems.  You have unusual weakness or numbness in your arms or legs.  You develop nausea or vomiting.  You develop abdominal pain.  You feel faint. Document Released: 06/06/2005 Document Revised: 12/06/2011 Document Reviewed: 10/25/2010 Exit[employer] Patient Information 2014 [employer].  

## 2013-08-22 ENCOUNTER — Ambulatory Visit: Payer: Medicare Other | Admitting: Internal Medicine

## 2013-08-23 ENCOUNTER — Telehealth: Payer: Self-pay | Admitting: *Deleted

## 2013-08-23 NOTE — Telephone Encounter (Signed)
Requesting antibiotic for 2 teeth which are painful.  Has [facility] dental appt in March.  Having muscle spasms in his back and would like a rx for muscle relaxant sent to pharmacy listed.  MD please advise.

## 2013-08-27 ENCOUNTER — Ambulatory Visit: Payer: Medicare Other | Admitting: Pharmacist Clinician (PhC)/ Clinical Pharmacy Specialist

## 2013-08-27 NOTE — Progress Notes (Signed)
Patient ID: Jordan Calderon, male   DOB: 1961-02-25, 53 y.o.   MRN: [id number] HPI: Jordan Parentsodd C Brinkmeyer is a 53 y.o. male who is here for his pharmacy meds consult.  Allergies: Allergies  Allergen Reactions  . Sulfonamide Derivatives     REACTION: itching    Vitals:    Past Medical History: Past Medical History  Diagnosis Date  . HIV (human immunodeficiency virus infection)     Social History: History   Social History  . Marital Status: Single    Spouse Name: N/A    Number of Children: N/A  . Years of Education: N/A   Social History Main Topics  . Smoking status: Current Every Day Smoker -- 0.20 packs/day for 30 years    Types: Cigarettes  . Smokeless tobacco: Never Used     Comment: seems to be slowing down, a pack a week  . Alcohol Use: No  . Drug Use: No  . Sexual Activity: No     Comment: declined condoms   Other Topics Concern  . Not on file   Social History Narrative  . No narrative on file    Previous Regimen:   Current Regimen: DRV/r + Truvada  Labs: HIV 1 RNA Quant (copies/mL)  Date Value  11/06/2012 948*  06/21/2012 [id number]*  03/22/2012 [id number]*     CD4 T Cell Abs (cmm)  Date Value  11/06/2012 240*  06/22/2012 140*  03/22/2012 130*     Hep B S Ab (no units)  Date Value  08/14/2006 No      Hepatitis B Surface Ag (no units)  Date Value  08/14/2006 No      HCV Ab (no units)  Date Value  08/14/2006 No     CrCl: The CrCl is unknown because both a height and weight (above a minimum accepted value) are required for this calculation.  Lipids:    Component Value Date/Time   CHOL 158 06/21/2012 1622   TRIG 185* 06/21/2012 1622   HDL 25* 06/21/2012 1622   CHOLHDL 6.3 06/21/2012 1622   VLDL 37 06/21/2012 1622   LDLCALC 96 06/21/2012 1622    Assessment: 53 yo who has had some issue recently with his adherence. He has been off of his meds for a while. He is really not on anything right now anyway. He came in with some mouth pain but it's more like gum pain  instead. He has some teeth extraction coming up. Just advised him to do warm salt water rinses. And really stress that the needs to restart his meds. He said he will restart his meds. He also ask about ED treatment if he ever get back in a relationship. I told him we can get those meds later if needed but now he needs to get back on his ART.   Recommendations:  Restart DRV/r + Truvada Gave him direction on how to take those meds again   Clide CliffPham, Thelma Viana Quang, PharmD Clinical Infectious Disease Pharmacist [facility] 08/27/2013, 1:26 PM

## 2013-09-09 ENCOUNTER — Ambulatory Visit: Payer: Medicare Other | Admitting: Internal Medicine

## 2013-09-17 ENCOUNTER — Encounter: Payer: Self-pay | Admitting: Internal Medicine

## 2013-09-17 ENCOUNTER — Ambulatory Visit (INDEPENDENT_AMBULATORY_CARE_PROVIDER_SITE_OTHER): Payer: Medicare Other | Admitting: Internal Medicine

## 2013-09-17 ENCOUNTER — Other Ambulatory Visit: Payer: Self-pay | Admitting: Internal Medicine

## 2013-09-17 VITALS — BP 151/90 | HR 88 | Temp 98.6°F | Ht 69.0 in | Wt 149.8 lb

## 2013-09-17 DIAGNOSIS — Z113 Encounter for screening for infections with a predominantly sexual mode of transmission: Secondary | ICD-10-CM

## 2013-09-17 DIAGNOSIS — B20 Human immunodeficiency virus [HIV] disease: Secondary | ICD-10-CM

## 2013-09-17 DIAGNOSIS — Z79899 Other long term (current) drug therapy: Secondary | ICD-10-CM

## 2013-09-17 LAB — COMPREHENSIVE METABOLIC PANEL
ALK PHOS: 84 U/L (ref 39–117)
ALT: 25 U/L (ref 0–53)
AST: 34 U/L (ref 0–37)
Albumin: 4.1 g/dL (ref 3.5–5.2)
BILIRUBIN TOTAL: 0.4 mg/dL (ref 0.2–1.2)
BUN: 18 mg/dL (ref 6–23)
CO2: 26 mEq/L (ref 19–32)
CREATININE: 0.8 mg/dL (ref 0.50–1.35)
Calcium: 9.1 mg/dL (ref 8.4–10.5)
Chloride: 98 mEq/L (ref 96–112)
Glucose, Bld: 77 mg/dL (ref 70–99)
Potassium: 4.3 mEq/L (ref 3.5–5.3)
Sodium: 132 mEq/L — ABNORMAL LOW (ref 135–145)
TOTAL PROTEIN: 7.9 g/dL (ref 6.0–8.3)

## 2013-09-17 LAB — RPR

## 2013-09-17 LAB — CBC
HCT: 39.4 % (ref 39.0–52.0)
Hemoglobin: 14.3 g/dL (ref 13.0–17.0)
MCH: 32.1 pg (ref 26.0–34.0)
MCHC: 36.3 g/dL — AB (ref 30.0–36.0)
MCV: 88.5 fL (ref 78.0–100.0)
PLATELETS: 127 10*3/uL — AB (ref 150–400)
RBC: 4.45 MIL/uL (ref 4.22–5.81)
RDW: 14.4 % (ref 11.5–15.5)
WBC: 3 10*3/uL — ABNORMAL LOW (ref 4.0–10.5)

## 2013-09-17 LAB — LIPID PANEL
CHOL/HDL RATIO: 5.1 ratio
CHOLESTEROL: 147 mg/dL (ref 0–200)
HDL: 29 mg/dL — AB (ref >39)
LDL Cholesterol: 98 mg/dL (ref 0–99)
Triglycerides: 101 mg/dL (ref <150)
VLDL: 20 mg/dL (ref 0–40)

## 2013-09-17 MED ORDER — DARUNAVIR-COBICISTAT 800-150 MG PO TABS: 1.0000 | ORAL_TABLET | Freq: Every day | ORAL | Status: DC

## 2013-09-17 NOTE — Progress Notes (Signed)
Patient ID: Jordan Calderon, male   DOB: 08/01/1960, 53 y.o.   MRN: [id number] @LOGODEPT         Patient Active Problem List   Diagnosis Date Noted  . HIV DISEASE 04/06/2006    Priority: High  . Cigarette smoker 07/29/2008    Priority: Medium  . ANXIETY 04/06/2006    Priority: Medium  . SUBSTANCE ABUSE, MULTIPLE 04/06/2006    Priority: Medium  . DEPRESSION 04/06/2006    Priority: Medium  . UPPER RESPIRATORY INFECTION, ACUTE 07/28/2009  . FRACTURE, RIB, LEFT 07/28/2009  . HEPATITIS C 04/06/2006  . CANDIDAL ESOPHAGITIS 04/06/2006  . DENTAL CARIES 04/06/2006  . ANAL FISSURE 04/06/2006    Patient's Medications  New Prescriptions   DARUNAVIR-COBICISTAT (PREZCOBIX) 800-150 MG PER TABLET    Take 1 tablet by mouth daily. Swallow whole. Do NOT crush, break or chew tablets. Take with food.  Previous Medications   CLONAZEPAM (KLONOPIN) 0.5 MG TABLET    Take 1 tablet (0.5 mg total) by mouth 2 (two) times daily.   DAPSONE 100 MG TABLET    Take 1 tablet (100 mg total) by mouth daily.   EMTRICITABINE-TENOFOVIR (TRUVADA) 200-300 MG PER TABLET    Take 1 tablet by mouth daily.   PAROXETINE (PAXIL) 40 MG TABLET    Take 1 tablet (40 mg total) by mouth daily.  Modified Medications   No medications on file  Discontinued Medications   DARUNAVIR ETHANOLATE (PREZISTA) 800 MG TABLET    Take 1 tablet (800 mg total) by mouth daily with breakfast.   DIAZEPAM (VALIUM) 5 MG TABLET    Take 1 tablet (5 mg total) by mouth every 6 (six) hours as needed for muscle spasms (spasms).   HYDROCODONE-ACETAMINOPHEN (NORCO/VICODIN) 5-325 MG PER TABLET    Take 2 tablets by mouth every 4 (four) hours as needed.   IBUPROFEN (ADVIL,MOTRIN) 800 MG TABLET    Take 1 tablet (800 mg total) by mouth 3 (three) times daily.   RITONAVIR (NORVIR) 100 MG TABS TABLET    Take 1 tablet (100 mg total) by mouth daily.    Subjective: Jordan Calderon is in for his routine visit. He has his medications and pillbox with him. He states that he is doing  much better. He restarted his HIV medications 4 weeks ago and recalls missing only one dose when it fell out of his pillbox.  Review of Systems: Pertinent items are noted in HPI.  Past Medical History  Diagnosis Date  . HIV (human immunodeficiency virus infection)     History  Substance Use Topics  . Smoking status: Current Every Day Smoker -- 0.20 packs/day for 30 years    Types: Cigarettes  . Smokeless tobacco: Never Used     Comment: seems to be slowing down, a pack a week  . Alcohol Use: No    No family history on file.  Allergies  Allergen Reactions  . Sulfonamide Derivatives     REACTION: itching    Objective: Temp: 98.6 F (37 C) (03/31 1037) Temp src: Oral (03/31 1037) BP: 151/90 mmHg (03/31 1037) Pulse Rate: 88 (03/31 1037) Body mass index is 22.1 kg/(m^2).  General: He is smiling and in very good spirits Oral: No oropharyngeal lesions Skin: Healing bruise on the lower back Lungs: Clear Cor: Regular S1 and S2 no murmurs  Lab Results Lab Results  Component Value Date   WBC 3.8* 11/06/2012   HGB 15.5 11/06/2012   HCT 44.2 11/06/2012   MCV 95.3 11/06/2012   PLT  137* 11/06/2012    Lab Results  Component Value Date   CREATININE 0.91 11/06/2012   BUN 21 11/06/2012   NA 137 11/06/2012   K 4.3 11/06/2012   CL 106 11/06/2012   CO2 30 11/06/2012    Lab Results  Component Value Date   ALT 16 11/06/2012   AST 20 11/06/2012   ALKPHOS 68 11/06/2012   BILITOT 0.3 11/06/2012    Lab Results  Component Value Date   CHOL 158 06/21/2012   HDL 25* 06/21/2012   LDLCALC 96 06/21/2012   TRIG 185* 06/21/2012   CHOLHDL 6.3 06/21/2012    Lab Results HIV 1 RNA Quant (copies/mL)  Date Value  11/06/2012 948*  06/21/2012 [id number]*  03/22/2012 [id number]*     CD4 T Cell Abs (cmm)  Date Value  11/06/2012 240*  06/22/2012 140*  03/22/2012 130*     Assessment: Jordan Calderon's mood has stabilized recently and he has gotten over her some legal hurdles that have improved his  adherence.  Plan: 1. Simplify antiretroviral regimen to Truvada and Prezcobix 2. Check lab work today 3. Followup in 3 weeks   [name], MD [facility] [phone] pager   [phone] cell 09/17/2013, 11:06 AM

## 2013-09-18 LAB — HIV-1 RNA QUANT-NO REFLEX-BLD
HIV 1 RNA QUANT: 306156 {copies}/mL — AB (ref <20)
HIV-1 RNA Quant, Log: 5.49 {Log} — ABNORMAL HIGH (ref <1.30)

## 2013-09-18 LAB — T-HELPER CELL (CD4) - (RCID CLINIC ONLY)
CD4 T CELL HELPER: 15 % — AB (ref 33–55)
CD4 T Cell Abs: 160 /uL — ABNORMAL LOW (ref 400–2700)

## 2013-09-19 ENCOUNTER — Other Ambulatory Visit: Payer: Self-pay | Admitting: Internal Medicine

## 2013-09-19 DIAGNOSIS — B20 Human immunodeficiency virus [HIV] disease: Secondary | ICD-10-CM

## 2013-09-19 NOTE — Addendum Note (Signed)
Addended by: Mariea ClontsGREEN, Virgin Zellers D on: 09/19/2013 02:15 PM   Modules accepted: Orders

## 2013-09-27 LAB — HIV-1 INTEGRASE GENOTYPE

## 2013-09-27 LAB — HIV-1 GENOTYPR PLUS

## 2013-10-08 ENCOUNTER — Encounter: Payer: Self-pay | Admitting: Internal Medicine

## 2013-10-08 ENCOUNTER — Ambulatory Visit (INDEPENDENT_AMBULATORY_CARE_PROVIDER_SITE_OTHER): Payer: Medicare Other | Admitting: Internal Medicine

## 2013-10-08 VITALS — BP 129/85 | HR 72 | Temp 98.3°F | Ht 69.0 in | Wt 155.5 lb

## 2013-10-08 DIAGNOSIS — B20 Human immunodeficiency virus [HIV] disease: Secondary | ICD-10-CM

## 2013-10-08 MED ORDER — CLONAZEPAM 0.5 MG PO TABS: 0.5000 mg | ORAL_TABLET | Freq: Two times a day (BID) | ORAL | Status: DC
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
## 2013-10-08 NOTE — Progress Notes (Signed)
  [facility] - Pharmacist    HPI: Jordan Calderon is a 53 y.o. male presents with uncontrolled HIV. Patient is aware of his non compliance, and brought in his medication and pillbox.   Allergies: Allergies  Allergen Reactions  . Sulfonamide Derivatives     REACTION: itching    Vitals: Temp: 98.3 F (36.8 C) (04/21 1037) Temp src: Oral (04/21 1037) BP: 129/85 mmHg (04/21 1037) Pulse Rate: 72 (04/21 1037)  Past Medical History: Past Medical History  Diagnosis Date  . HIV (human immunodeficiency virus infection)     Social History: History   Social History  . Marital Status: Single    Spouse Name: N/A    Number of Children: N/A  . Years of Education: N/A   Social History Main Topics  . Smoking status: Current Every Day Smoker -- 0.20 packs/day for 30 years    Types: Cigarettes  . Smokeless tobacco: Never Used     Comment: seems to be slowing down, a pack a week  . Alcohol Use: No  . Drug Use: No  . Sexual Activity: No     Comment: declined condoms   Other Topics Concern  . None   Social History Narrative  . None    Previous Regimen: D/r + Truvada  Current Regimen: Prezcobix  + truvada  Labs: HIV 1 RNA Quant (copies/mL)  Date Value  09/17/2013 [id number]*  11/06/2012 948*  06/21/2012 [id number]*     CD4 T Cell Abs (/uL)  Date Value  09/17/2013 160*  11/06/2012 240*  06/22/2012 140*     Hep B S Ab (no units)  Date Value  08/14/2006 No      Hepatitis B Surface Ag (no units)  Date Value  08/14/2006 No      HCV Ab (no units)  Date Value  08/14/2006 No     CrCl: Estimated Creatinine Clearance: 105.3 ml/min (by C-G formula based on Cr of 0.8).  Lipids:    Component Value Date/Time   CHOL 147 09/17/2013 1126   TRIG 101 09/17/2013 1126   HDL 29* 09/17/2013 1126   CHOLHDL 5.1 09/17/2013 1126   VLDL 20 09/17/2013 1126   LDLCALC 98 09/17/2013 1126    Assessment: 53 yo M presents after being noncompliance with medications due  to difficulties at home. Patient states he was recently incarcerated for 3 days which contributed to lack of taking his medication, however his sister is back in his life to help get him back on track. Spoke with patient at length about the importance of taking his medication every single day. Patient seems to have a plan in place to help get himself back together, starting with cleaning his house, and taking his medication.  Recommendations: Follow up with patients compliance, and start of prezcobix with truvada.   Dolphus Jennyadha M Eldora Napp, Pharm.D. Clinical Pharmacist - Resident [facility] 10/08/2013, 3:45 PM

## 2013-11-07 ENCOUNTER — Ambulatory Visit (INDEPENDENT_AMBULATORY_CARE_PROVIDER_SITE_OTHER): Payer: Medicare Other | Admitting: Internal Medicine

## 2013-11-07 ENCOUNTER — Encounter: Payer: Self-pay | Admitting: Internal Medicine

## 2013-11-07 VITALS — BP 138/93 | HR 83 | Temp 98.1°F | Wt 151.8 lb

## 2013-11-07 DIAGNOSIS — B20 Human immunodeficiency virus [HIV] disease: Secondary | ICD-10-CM

## 2013-11-07 NOTE — Progress Notes (Signed)
Patient ID: Jordan Calderon, male   DOB: 10-12-1960, 53 y.o.   MRN: [id number]          Patient Active Problem List   Diagnosis Date Noted  . HIV DISEASE 04/06/2006    Priority: High  . Cigarette smoker 07/29/2008    Priority: Medium  . ANXIETY 04/06/2006    Priority: Medium  . SUBSTANCE ABUSE, MULTIPLE 04/06/2006    Priority: Medium  . DEPRESSION 04/06/2006    Priority: Medium  . UPPER RESPIRATORY INFECTION, ACUTE 07/28/2009  . FRACTURE, RIB, LEFT 07/28/2009  . HEPATITIS C 04/06/2006  . CANDIDAL ESOPHAGITIS 04/06/2006  . DENTAL CARIES 04/06/2006  . ANAL FISSURE 04/06/2006    Patient's Medications  New Prescriptions   No medications on file  Previous Medications   CLONAZEPAM (KLONOPIN) 0.5 MG TABLET    Take 1 tablet (0.5 mg total) by mouth 2 (two) times daily.   DAPSONE 100 MG TABLET    Take 1 tablet (100 mg total) by mouth daily.   DARUNAVIR-COBICISTAT (PREZCOBIX) 800-150 MG PER TABLET    Take 1 tablet by mouth daily. Swallow whole. Do NOT crush, break or chew tablets. Take with food.   EMTRICITABINE-TENOFOVIR (TRUVADA) 200-300 MG PER TABLET    Take 1 tablet by mouth daily.   PAROXETINE (PAXIL) 40 MG TABLET    Take 1 tablet (40 mg total) by mouth daily.  Modified Medications   No medications on file  Discontinued Medications   No medications on file    Subjective: Jordan Calderon is in for his routine visit. He is feeling much better. His sister, Jordan Calderon has been helping him get his life back in order. He just started his new antiretroviral regimen 4 days ago. He is using his pillbox. He is tolerating his medication well. He has not missed any doses.  Review of Systems: Pertinent items are noted in HPI.  Past Medical History  Diagnosis Date  . HIV (human immunodeficiency virus infection)     History  Substance Use Topics  . Smoking status: Current Every Day Smoker -- 0.10 packs/day for 30 years    Types: Cigarettes  . Smokeless tobacco: Never Used     Comment: seems to be  slowing down, a pack a week  . Alcohol Use: No    No family history on file.  Allergies  Allergen Reactions  . Sulfonamide Derivatives     REACTION: itching    Objective: Temp: 98.1 F (36.7 C) (05/21 1057) Temp src: Oral (05/21 1057) BP: 138/93 mmHg (05/21 1057) Pulse Rate: 83 (05/21 1057) Body mass index is 22.4 kg/(m^2).  General: He is smiling and in good spirits Lungs: Clear Cor: Regular S1-S2 no murmurs  Lab Results Lab Results  Component Value Date   WBC 3.0* 09/17/2013   HGB 14.3 09/17/2013   HCT 39.4 09/17/2013   MCV 88.5 09/17/2013   PLT 127* 09/17/2013    Lab Results  Component Value Date   CREATININE 0.80 09/17/2013   BUN 18 09/17/2013   NA 132* 09/17/2013   K 4.3 09/17/2013   CL 98 09/17/2013   CO2 26 09/17/2013    Lab Results  Component Value Date   ALT 25 09/17/2013   AST 34 09/17/2013   ALKPHOS 84 09/17/2013   BILITOT 0.4 09/17/2013    Lab Results  Component Value Date   CHOL 147 09/17/2013   HDL 29* 09/17/2013   LDLCALC 98 09/17/2013   TRIG 101 09/17/2013   CHOLHDL 5.1 09/17/2013  Lab Results HIV 1 RNA Quant (copies/mL)  Date Value  09/17/2013 [id number]*  11/06/2012 948*  06/21/2012 [id number]*     CD4 T Cell Abs (/uL)  Date Value  09/17/2013 160*  11/06/2012 240*  06/22/2012 140*     Assessment: He is doing well so far back on antiretroviral therapy.  Plan: 1. Continue Truvada and Prezcobix 2. Continue Paxil and lorazepam as needed 3. He will reenter her counseling for depression and anxiety at [facility] 4. Followup here after blood work in 6 weeks   Cliffton AstersJohn Keyetta Hollingworth, MD [facility] [phone] pager   [phone] cell 11/07/2013, 11:23 AM

## 2013-12-19 ENCOUNTER — Other Ambulatory Visit: Payer: Medicare Other

## 2014-01-07 ENCOUNTER — Ambulatory Visit: Payer: Medicare Other | Admitting: Internal Medicine

## 2014-02-03 ENCOUNTER — Ambulatory Visit: Payer: Medicare Other | Admitting: Internal Medicine

## 2014-02-10 ENCOUNTER — Emergency Department (HOSPITAL_BASED_OUTPATIENT_CLINIC_OR_DEPARTMENT_OTHER): Payer: Medicare Other

## 2014-02-10 ENCOUNTER — Inpatient Hospital Stay (HOSPITAL_BASED_OUTPATIENT_CLINIC_OR_DEPARTMENT_OTHER)
Admission: EM | Admit: 2014-02-10 | Discharge: 2014-02-11 | DRG: 603 | Disposition: A | Discharge status: Home / Self Care | Payer: Medicare Other | Attending: Internal Medicine | Admitting: Internal Medicine

## 2014-02-10 ENCOUNTER — Encounter (HOSPITAL_BASED_OUTPATIENT_CLINIC_OR_DEPARTMENT_OTHER): Payer: Self-pay | Admitting: Emergency Medicine

## 2014-02-10 DIAGNOSIS — D696 Thrombocytopenia, unspecified: Secondary | ICD-10-CM | POA: Diagnosis present

## 2014-02-10 DIAGNOSIS — IMO0002 Reserved for concepts with insufficient information to code with codable children: Secondary | ICD-10-CM | POA: Diagnosis present

## 2014-02-10 DIAGNOSIS — F411 Generalized anxiety disorder: Secondary | ICD-10-CM | POA: Diagnosis present

## 2014-02-10 DIAGNOSIS — F3289 Other specified depressive episodes: Secondary | ICD-10-CM | POA: Diagnosis present

## 2014-02-10 DIAGNOSIS — F17200 Nicotine dependence, unspecified, uncomplicated: Secondary | ICD-10-CM | POA: Diagnosis present

## 2014-02-10 DIAGNOSIS — Z91199 Patient's noncompliance with other medical treatment and regimen due to unspecified reason: Secondary | ICD-10-CM | POA: Diagnosis not present

## 2014-02-10 DIAGNOSIS — Z79899 Other long term (current) drug therapy: Secondary | ICD-10-CM

## 2014-02-10 DIAGNOSIS — Z21 Asymptomatic human immunodeficiency virus [HIV] infection status: Secondary | ICD-10-CM | POA: Diagnosis present

## 2014-02-10 DIAGNOSIS — F329 Major depressive disorder, single episode, unspecified: Secondary | ICD-10-CM | POA: Diagnosis present

## 2014-02-10 DIAGNOSIS — Z9119 Patient's noncompliance with other medical treatment and regimen: Secondary | ICD-10-CM | POA: Diagnosis not present

## 2014-02-10 DIAGNOSIS — L0390 Cellulitis, unspecified: Secondary | ICD-10-CM | POA: Diagnosis present

## 2014-02-10 DIAGNOSIS — L03114 Cellulitis of left upper limb: Secondary | ICD-10-CM

## 2014-02-10 HISTORY — DX: Major depressive disorder, single episode, unspecified: F32.9

## 2014-02-10 HISTORY — DX: Headache: R51

## 2014-02-10 HISTORY — DX: Depression, unspecified: F32.A

## 2014-02-10 HISTORY — DX: Cellulitis, unspecified: L03.90

## 2014-02-10 LAB — URINALYSIS, ROUTINE W REFLEX MICROSCOPIC
BILIRUBIN URINE: NEGATIVE
GLUCOSE, UA: NEGATIVE mg/dL
Hgb urine dipstick: NEGATIVE
Ketones, ur: NEGATIVE mg/dL
Leukocytes, UA: NEGATIVE
NITRITE: NEGATIVE
Protein, ur: NEGATIVE mg/dL
SPECIFIC GRAVITY, URINE: 1.006 (ref 1.005–1.030)
Urobilinogen, UA: 0.2 mg/dL (ref 0.0–1.0)
pH: 6.5 (ref 5.0–8.0)

## 2014-02-10 LAB — CBC WITH DIFFERENTIAL/PLATELET
Basophils Absolute: 0 10*3/uL (ref 0.0–0.1)
Basophils Relative: 0 % (ref 0–1)
Eosinophils Absolute: 0.1 10*3/uL (ref 0.0–0.7)
Eosinophils Relative: 1 % (ref 0–5)
HEMATOCRIT: 41.6 % (ref 39.0–52.0)
HEMOGLOBIN: 14.8 g/dL (ref 13.0–17.0)
LYMPHS PCT: 20 % (ref 12–46)
Lymphs Abs: 1 10*3/uL (ref 0.7–4.0)
MCH: 32.7 pg (ref 26.0–34.0)
MCHC: 35.6 g/dL (ref 30.0–36.0)
MCV: 92 fL (ref 78.0–100.0)
MONO ABS: 0.6 10*3/uL (ref 0.1–1.0)
MONOS PCT: 12 % (ref 3–12)
NEUTROS ABS: 3.3 10*3/uL (ref 1.7–7.7)
NEUTROS PCT: 67 % (ref 43–77)
Platelets: 149 10*3/uL — ABNORMAL LOW (ref 150–400)
RBC: 4.52 MIL/uL (ref 4.22–5.81)
RDW: 13.5 % (ref 11.5–15.5)
WBC: 5 10*3/uL (ref 4.0–10.5)

## 2014-02-10 LAB — COMPREHENSIVE METABOLIC PANEL
ALK PHOS: 98 U/L (ref 39–117)
ALT: 16 U/L (ref 0–53)
AST: 21 U/L (ref 0–37)
Albumin: 3.8 g/dL (ref 3.5–5.2)
Anion gap: 15 (ref 5–15)
BUN: 21 mg/dL (ref 6–23)
CO2: 25 meq/L (ref 19–32)
CREATININE: 0.8 mg/dL (ref 0.50–1.35)
Calcium: 10 mg/dL (ref 8.4–10.5)
Chloride: 95 mEq/L — ABNORMAL LOW (ref 96–112)
GFR calc Af Amer: >90 mL/min (ref >90)
Glucose, Bld: 88 mg/dL (ref 70–99)
POTASSIUM: 3.9 meq/L (ref 3.7–5.3)
Sodium: 135 mEq/L — ABNORMAL LOW (ref 137–147)
Total Bilirubin: 0.7 mg/dL (ref 0.3–1.2)
Total Protein: 9.6 g/dL — ABNORMAL HIGH (ref 6.0–8.3)

## 2014-02-10 LAB — I-STAT CG4 LACTIC ACID, ED: LACTIC ACID, VENOUS: 1.31 mmol/L (ref 0.5–2.2)

## 2014-02-10 MED ORDER — ACETAMINOPHEN 325 MG PO TABS
650.0000 mg | ORAL_TABLET | Freq: Four times a day (QID) | ORAL | Status: DC | PRN
  Administered 2014-02-10: 650 mg via ORAL
  Filled 2014-02-10: qty 2

## 2014-02-10 MED ORDER — LORAZEPAM 1 MG PO TABS: 2.0000 mg | ORAL_TABLET | Freq: Four times a day (QID) | ORAL | Status: DC | PRN

## 2014-02-10 MED ORDER — VANCOMYCIN HCL IN DEXTROSE 1-5 GM/200ML-% IV SOLN
1000.0000 mg | Freq: Once | INTRAVENOUS | Status: AC
  Administered 2014-02-10: 1000 mg via INTRAVENOUS
  Filled 2014-02-10: qty 200

## 2014-02-10 MED ORDER — MAGIC MOUTHWASH W/LIDOCAINE
5.0000 mL | Freq: Four times a day (QID) | ORAL | Status: DC
  Administered 2014-02-10 – 2014-02-11 (×4): 5 mL via ORAL
  Filled 2014-02-10 (×5): qty 5

## 2014-02-10 MED ORDER — HYDROCODONE-ACETAMINOPHEN 5-325 MG PO TABS: 1.0000 | ORAL_TABLET | ORAL | Status: DC | PRN

## 2014-02-10 MED ORDER — EMTRICITABINE-TENOFOVIR DF 200-300 MG PO TABS
1.0000 | ORAL_TABLET | Freq: Every day | ORAL | Status: DC
  Administered 2014-02-10 – 2014-02-11 (×2): 1 via ORAL
  Filled 2014-02-10 (×2): qty 1

## 2014-02-10 MED ORDER — ONDANSETRON HCL 4 MG/2ML IJ SOLN: 4.0000 mg | Freq: Four times a day (QID) | INTRAMUSCULAR | Status: DC | PRN

## 2014-02-10 MED ORDER — ASPIRIN EC 81 MG PO TBEC
81.0000 mg | DELAYED_RELEASE_TABLET | Freq: Every day | ORAL | Status: DC
  Administered 2014-02-10 – 2014-02-11 (×2): 81 mg via ORAL
  Filled 2014-02-10 (×2): qty 1

## 2014-02-10 MED ORDER — DARUNAVIR-COBICISTAT 800-150 MG PO TABS
1.0000 | ORAL_TABLET | Freq: Every day | ORAL | Status: DC
  Administered 2014-02-10 – 2014-02-11 (×2): 1 via ORAL
  Filled 2014-02-10 (×3): qty 1

## 2014-02-10 MED ORDER — SODIUM CHLORIDE 0.9 % IV SOLN
1000.0000 mL | INTRAVENOUS | Status: DC
  Administered 2014-02-10 – 2014-02-11 (×2): 1000 mL via INTRAVENOUS

## 2014-02-10 MED ORDER — SODIUM CHLORIDE 0.9 % IV BOLUS (SEPSIS)
30.0000 mL/kg | Freq: Once | INTRAVENOUS | Status: AC
  Administered 2014-02-10: 2109 mL via INTRAVENOUS

## 2014-02-10 MED ORDER — CLONAZEPAM 0.5 MG PO TABS
0.5000 mg | ORAL_TABLET | Freq: Two times a day (BID) | ORAL | Status: DC
  Administered 2014-02-10 – 2014-02-11 (×2): 0.5 mg via ORAL
  Filled 2014-02-10 (×2): qty 1

## 2014-02-10 MED ORDER — ENOXAPARIN SODIUM 40 MG/0.4ML ~~LOC~~ SOLN
40.0000 mg | SUBCUTANEOUS | Status: DC
  Administered 2014-02-10: 40 mg via SUBCUTANEOUS
  Filled 2014-02-10 (×2): qty 0.4

## 2014-02-10 MED ORDER — PAROXETINE HCL 20 MG PO TABS
40.0000 mg | ORAL_TABLET | Freq: Every day | ORAL | Status: DC
  Administered 2014-02-10 – 2014-02-11 (×2): 40 mg via ORAL
  Filled 2014-02-10 (×2): qty 2

## 2014-02-10 MED ORDER — PIPERACILLIN-TAZOBACTAM 3.375 G IVPB 30 MIN
3.3750 g | Freq: Once | INTRAVENOUS | Status: AC
  Administered 2014-02-10: 3.375 g via INTRAVENOUS
  Filled 2014-02-10 (×2): qty 50

## 2014-02-10 MED ORDER — ONDANSETRON HCL 4 MG PO TABS
4.0000 mg | ORAL_TABLET | Freq: Four times a day (QID) | ORAL | Status: DC | PRN
Start: 2014-02-10 — End: 2014-02-12
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
## 2014-02-10 NOTE — ED Notes (Signed)
Teach done with Pt. On how we handle sepsis protocol and hospital stay and left open conversation for any questions.

## 2014-02-10 NOTE — ED Provider Notes (Signed)
CSN: [id number]     Arrival date & time 02/10/14  1447 History  This chart was scribed for Hilario Quarry, MD by Charline Bills, ED Scribe. The patient was seen in room MH10/MH10. Patient's care was started at 3:28 PM.   Chief Complaint  Patient presents with  . Insect Bite   Patient is a 53 y.o. male presenting with animal bite. No language interpreter was used.  Animal Bite Contact animal:  Insect Location:  Shoulder/arm Shoulder/arm injury location:  L arm Time since incident:  2 days Pain details:    Severity:  Mild   Timing:  Constant   Progression:  Worsening Associated symptoms: rash    HPI Comments: Jordan Calderon is a 53 y.o. male, with a h/o HIV, who presents to the Emergency Department complaining of suspected insect bite to L arm first noted 2 days ago. Pt states that he did not actually see an insect bite him. He reports associated pain and gradually worsening redness onset 2 days ago. Pt also reports associated neck pain, jaw  pain, cough, SOB, rash to genitalia. He denies vomiting, diarrhea. Pt states that he has not eaten in 2 days due to the severity of pain. Pt denies checking his temperature at home; ED temperature 99.6 F. Pt last had CD4 count 1 month ago, next appointment is tomorrow. Pt admits to not taking his medications as prescribed. Pt was diagnosed with HIV approximately 10 years ago. He denies any AIDS defining illnesses. Pt states that he is taking a new cocktail. He denies recent hospitalizations. Pt reports tobacco and alcohol use. Allergy to Sulfa.   Past Medical History  Diagnosis Date  . HIV (human immunodeficiency virus infection)    Past Surgical History  Procedure Laterality Date  . Arm surgery     No family history on file. History  Substance Use Topics  . Smoking status: Current Every Day Smoker -- 0.10 packs/day for 30 years    Types: Cigarettes  . Smokeless tobacco: Never Used  . Alcohol Use: No    Review of Systems  HENT:       Jaw  pain  Respiratory: Positive for cough and shortness of breath.   Gastrointestinal: Negative for vomiting and diarrhea.  Musculoskeletal: Positive for neck pain.  Skin: Positive for rash.  All other systems reviewed and are negative.  Allergies  Sulfonamide derivatives  Home Medications   Prior to Admission medications   Medication Sig Start Date End Date Taking? Authorizing Provider  LORazepam (ATIVAN) 2 MG tablet Take 2 mg by mouth every 6 (six) hours as needed for anxiety.   Yes Historical Provider, MD  clonazePAM (KLONOPIN) 0.5 MG tablet Take 1 tablet (0.5 mg total) by mouth 2 (two) times daily. 10/08/13   [name], MD  dapsone 100 MG tablet Take 1 tablet (100 mg total) by mouth daily. 06/04/13   [name], MD  Darunavir-Cobicistat (PREZCOBIX) 800-150 MG per tablet Take 1 tablet by mouth daily. Swallow whole. Do NOT crush, break or chew tablets. Take with food. 09/17/13   [name], MD  emtricitabine-tenofovir (TRUVADA) 200-300 MG per tablet Take 1 tablet by mouth daily. 06/04/13   [name], MD  PARoxetine (PAXIL) 40 MG tablet Take 1 tablet (40 mg total) by mouth daily. 06/04/13   [name], MD   Triage Vitals: BP 129/77  Pulse 107  Temp(Src) 99.6 F (37.6 C) (Oral)  Resp 20  Ht  (1.753 m)  Wt 155 lb (70.308 kg)  BMI 22.88 kg/m2  SpO2 95% Physical Exam  Skin: There is erythema.  Toes: Fungal infection on all toenails L arm: Indurated erythema area on L ulnar surface of L forearm extending into dorsal aspect Covers 2/3 forearm Tender fluctuant area in center May have an abscess   ED Course  Procedures (including critical care time) DIAGNOSTIC STUDIES: Oxygen Saturation is 95% on RA, adequate by my interpretation.    COORDINATION OF CARE: 3:37 PM-Discussed treatment plan which includes CXR and diagnostic lab work with pt at bedside and pt agreed to plan.   Labs Review Labs Reviewed  CBC WITH DIFFERENTIAL - Abnormal; Notable for the  following:    Platelets 149 (*)    All other components within normal limits  COMPREHENSIVE METABOLIC PANEL - Abnormal; Notable for the following:    Sodium 135 (*)    Chloride 95 (*)    Total Protein 9.6 (*)    All other components within normal limits  CULTURE, BLOOD (ROUTINE X 2)  CULTURE, BLOOD (ROUTINE X 2)  URINE CULTURE  URINALYSIS, ROUTINE W REFLEX MICROSCOPIC  I-STAT CG4 LACTIC ACID, ED   Imaging Review Dg Chest Port 1 View  02/10/2014   CLINICAL DATA:  Bit by insect on August 21st. Has been lightheaded with nausea and shortness of breath  EXAM: PORTABLE CHEST - 1 VIEW  COMPARISON:  05/05/2011  FINDINGS: The heart size and mediastinal contours are within normal limits. Both lungs are clear. The visualized skeletal structures are unremarkable.  IMPRESSION: No active disease.   Electronically Signed   By: Elige Ko   On: 02/10/2014 16:22    EKG Interpretation   Date/Time:  Monday February 10 2014 16:06:36 EDT Ventricular Rate:  93 PR Interval:  138 QRS Duration: 88 QT Interval:  340 QTC Calculation: 422 R Axis:   69 Text Interpretation:  Normal sinus rhythm Normal ECG Confirmed by Yoneko Talerico MD,  Jejuan Scala ([id number]) on 02/10/2014 5:55:13 PM     Filed Vitals:   02/10/14 1700 02/10/14 1715 02/10/14 1730 02/10/14 1745  BP: 124/80 124/81 128/79 125/81  Pulse: 92 96 86 88  Temp:      TempSrc:      Resp:      Height:      Weight:      SpO2: 98% 99% 100% 99%    MDM   Final diagnoses:  Cellulitis of left forearm    53 y.o. Male with noncompliance with id regime presents with cellulitis of left forearm.  Discussed with Dr. Virgina Organ and patient to be transferred to med surg bed at [facility].  Plan [place] here to assess for abscess prior to transfer.    I personally performed the services described in this documentation, which was scribed in my presence. The recorded information has been reviewed and is accurate.    Hilario Quarry, MD 02/10/14 [phone]

## 2014-02-10 NOTE — ED Notes (Signed)
Pt. In no distress with reports he has not been taking his HIV meds correctly for 1 month.  Pt. Tearful and scared due to possible infection in the L forearm from a bug bite? And his auto immune compromise.

## 2014-02-10 NOTE — ED Notes (Addendum)
C/o insect bite to left arm 8/21-c/o pain, redness increase-also c/o pain to "entire right side", nausea, lightheaded, sob since insect bite

## 2014-02-10 NOTE — ED Notes (Signed)
Attempted report to Erin Hearing at [facility] on [address] she will return my call.

## 2014-02-11 ENCOUNTER — Encounter (HOSPITAL_COMMUNITY): Payer: Self-pay | Admitting: General Practice

## 2014-02-11 ENCOUNTER — Ambulatory Visit: Payer: Medicare Other | Admitting: Internal Medicine

## 2014-02-11 ENCOUNTER — Telehealth: Payer: Self-pay | Admitting: Licensed Clinical Social Worker

## 2014-02-11 DIAGNOSIS — F3289 Other specified depressive episodes: Secondary | ICD-10-CM

## 2014-02-11 DIAGNOSIS — R21 Rash and other nonspecific skin eruption: Secondary | ICD-10-CM

## 2014-02-11 DIAGNOSIS — L0390 Cellulitis, unspecified: Secondary | ICD-10-CM

## 2014-02-11 DIAGNOSIS — F329 Major depressive disorder, single episode, unspecified: Secondary | ICD-10-CM

## 2014-02-11 DIAGNOSIS — IMO0002 Reserved for concepts with insufficient information to code with codable children: Principal | ICD-10-CM

## 2014-02-11 DIAGNOSIS — Z21 Asymptomatic human immunodeficiency virus [HIV] infection status: Secondary | ICD-10-CM

## 2014-02-11 DIAGNOSIS — D696 Thrombocytopenia, unspecified: Secondary | ICD-10-CM

## 2014-02-11 DIAGNOSIS — L608 Other nail disorders: Secondary | ICD-10-CM

## 2014-02-11 DIAGNOSIS — F411 Generalized anxiety disorder: Secondary | ICD-10-CM

## 2014-02-11 HISTORY — DX: Cellulitis, unspecified: L03.90

## 2014-02-11 LAB — CBC
HCT: 36.3 % — ABNORMAL LOW (ref 39.0–52.0)
Hemoglobin: 12.8 g/dL — ABNORMAL LOW (ref 13.0–17.0)
MCH: 31.9 pg (ref 26.0–34.0)
MCHC: 35.3 g/dL (ref 30.0–36.0)
MCV: 90.5 fL (ref 78.0–100.0)
PLATELETS: 120 10*3/uL — AB (ref 150–400)
RBC: 4.01 MIL/uL — AB (ref 4.22–5.81)
RDW: 13.4 % (ref 11.5–15.5)
WBC: 2.7 10*3/uL — AB (ref 4.0–10.5)

## 2014-02-11 LAB — COMPREHENSIVE METABOLIC PANEL
ALBUMIN: 3 g/dL — AB (ref 3.5–5.2)
ALT: 14 U/L (ref 0–53)
AST: 18 U/L (ref 0–37)
Alkaline Phosphatase: 77 U/L (ref 39–117)
Anion gap: 11 (ref 5–15)
BUN: 15 mg/dL (ref 6–23)
CALCIUM: 8.4 mg/dL (ref 8.4–10.5)
CO2: 22 mEq/L (ref 19–32)
CREATININE: 0.81 mg/dL (ref 0.50–1.35)
Chloride: 104 mEq/L (ref 96–112)
GFR calc Af Amer: >90 mL/min (ref >90)
Glucose, Bld: 80 mg/dL (ref 70–99)
Potassium: 4 mEq/L (ref 3.7–5.3)
SODIUM: 137 meq/L (ref 137–147)
Total Bilirubin: 0.6 mg/dL (ref 0.3–1.2)
Total Protein: 7.3 g/dL (ref 6.0–8.3)

## 2014-02-11 LAB — TSH: TSH: 4.88 u[IU]/mL — AB (ref 0.350–4.500)

## 2014-02-11 LAB — T-HELPER CELLS (CD4) COUNT (NOT AT ARMC)
CD4 % Helper T Cell: 9 % — ABNORMAL LOW (ref 33–55)
CD4 T Cell Abs: 80 /uL — ABNORMAL LOW (ref 400–2700)

## 2014-02-11 LAB — HEMOGLOBIN A1C
Hgb A1c MFr Bld: 5.5 % (ref <5.7)
Mean Plasma Glucose: 111 mg/dL (ref <117)

## 2014-02-11 MED ORDER — DARUNAVIR-COBICISTAT 800-150 MG PO TABS: 1.0000 | ORAL_TABLET | Freq: Every day | ORAL | Status: DC

## 2014-02-11 MED ORDER — DOXYCYCLINE HYCLATE 50 MG PO CAPS
100.0000 mg | ORAL_CAPSULE | Freq: Two times a day (BID) | ORAL | Status: DC
Start: 2014-02-11 — End: 2014-04-03

## 2014-02-11 MED ORDER — MAGIC MOUTHWASH W/LIDOCAINE: 5.0000 mL | Freq: Four times a day (QID) | ORAL | Status: DC

## 2014-02-11 MED ORDER — EMTRICITABINE-TENOFOVIR DF 200-300 MG PO TABS: 1.0000 | ORAL_TABLET | Freq: Every day | ORAL | Status: DC

## 2014-02-11 MED ORDER — DAPSONE 100 MG PO TABS
100.0000 mg | ORAL_TABLET | Freq: Every day | ORAL | Status: DC
  Administered 2014-02-11: 100 mg via ORAL
  Filled 2014-02-11 (×3): qty 1

## 2014-02-11 MED ORDER — DAPSONE 100 MG PO TABS: 100.0000 mg | ORAL_TABLET | Freq: Every day | ORAL | Status: DC

## 2014-02-11 MED ORDER — DOXYCYCLINE HYCLATE 50 MG PO CAPS: 100.0000 mg | ORAL_CAPSULE | Freq: Two times a day (BID) | ORAL | Status: DC

## 2014-02-11 MED ORDER — VANCOMYCIN HCL 10 G IV SOLR
1250.0000 mg | Freq: Two times a day (BID) | INTRAVENOUS | Status: DC
  Administered 2014-02-11: 1250 mg via INTRAVENOUS
  Filled 2014-02-11 (×3): qty 1250

## 2014-02-11 NOTE — Progress Notes (Signed)
ANTIBIOTIC CONSULT NOTE - INITIAL  Pharmacy Consult for vancomycin Indication: cellulitis  Allergies  Allergen Reactions  . Sulfonamide Derivatives     REACTION: itching    Patient Measurements: Height:  (175.3 cm) Weight: 155 lb (70.308 kg) IBW/kg (Calculated) : 70.7  Vital Signs: Temp: 98.1 F (36.7 C) (08/25 0610) Temp src: Oral (08/25 0610) BP: 108/67 mmHg (08/25 0610) Pulse Rate: 77 (08/25 0610)  Labs:  Recent Labs  02/10/14 1555  WBC 5.0  HGB 14.8  PLT 149*  CREATININE 0.80   Estimated Creatinine Clearance: 105 ml/min (by C-G formula based on Cr of 0.8).   Microbiology: No results found for this or any previous visit (from the past 720 hour(s)).  Medical History: Past Medical History  Diagnosis Date  . HIV (human immunodeficiency virus infection)     Medications:  Prescriptions prior to admission  Medication Sig Dispense Refill  . LORazepam (ATIVAN) 2 MG tablet Take 2 mg by mouth every 6 (six) hours as needed for anxiety.      Marland Kitchen PARoxetine (PAXIL) 40 MG tablet Take 1 tablet (40 mg total) by mouth daily.  30 tablet  11  . clonazePAM (KLONOPIN) 0.5 MG tablet Take 1 tablet (0.5 mg total) by mouth 2 (two) times daily.  60 tablet  5   Scheduled:  . aspirin EC  81 mg Oral Daily  . clonazePAM  0.5 mg Oral BID  . dapsone  100 mg Oral Daily  . darunavir-cobicistat  1 tablet Oral Daily  . emtricitabine-tenofovir  1 tablet Oral Daily  . enoxaparin (LOVENOX) injection  40 mg Subcutaneous Q24H  . magic mouthwash w/lidocaine  5 mL Oral QID  . PARoxetine  40 mg Oral Daily   Infusions:  . sodium chloride 1,000 mL (02/10/14 1740)    Assessment: 53yo male w/ h/o HIV noncompliant on tx c/o LUE redness preceded by two non-pruritic painful bumps, to begin IV ABX for cellulitis  Goal of Therapy:  Vancomycin trough level 15-20 mcg/ml  Plan:  Rec'd vanc 1g IV in ED yesterday afternoon; will continue with vancomycin  IV Q12H and monitor CBC, Cx, levels  prn.  Vernard Gambles, PharmD, BCPS  02/11/2014,7:30 AM

## 2014-02-11 NOTE — Telephone Encounter (Signed)
[place] Social Worker for [employer] called stating that the patient would like to start seeing [employer] again, and that he has transportation difficulties as well. He also requested that Dr. Orvan Falconer, speak with his sister about his current admission. Patient also wanted Dr. Orvan Falconer to come see him in the Hospital before he is discharged. I advised the Child psychotherapist that he will have a hsfu in a few weeks and we can get him scheduled with [name], and discuss transportation issues. I discussed Bridge counseling services, bus passes, or medicaid transportation as long term option. I also mentioned that Dr. Orvan Falconer was not on call for [facility] and if there is a consult it will be Dr. Luciana Axe to come see the patient.

## 2014-02-11 NOTE — Progress Notes (Addendum)
Subjective:  Feeling better. Left arm pain and swelling is better. Denies any fever/chills/n/v/abd pain, chest pain. Sob. Objective: Vital signs in last 24 hours: Filed Vitals:   02/10/14 1821 02/10/14 1920 02/10/14 2204 02/11/14 0610  BP: 127/81 137/79 124/81 108/67  Pulse: 81 77 74 77  Temp: 98.7 F (37.1 C) 98.2 F (36.8 C) 98.1 F (36.7 C) 98.1 F (36.7 C)  TempSrc:  Oral Oral Oral  Resp: Height:      Weight:      SpO2: 100% 100% 99% 100%   Weight change:   Intake/Output Summary (Last 24 hours) at 02/11/14 1153 Last data filed at 02/11/14 1036  Gross per 24 hour  Intake 1666.67 ml  Output   1450 ml  Net 216.67 ml   Vitals reviewed. General: resting in bed, NAD HEENT: PERRL, EOMI, no scleral icterus. Cardiac: RRR, no rubs, murmurs or gallops Pulm: clear to auscultation bilaterally, no wheezes, rales, or rhonchi Abd: soft, nontender, nondistended, BS present Groin: has erythematous rashes on both groins.  Ext: warm and well perfused, no pedal edema. Left forearm cellulitis looks improved. Still warm and TTP but improved. No drainage. Neuro: alert and oriented X3, cranial nerves II-XII grossly intact, strength and sensation to light touch equal in bilateral upper and lower extremities  Lab Results: Basic Metabolic Panel:  Recent Labs Lab 02/10/14 1555 02/11/14 0646  NA 135* 137  K 3.9 4.0  CL 95* 104  CO2 25 22  GLUCOSE 88 80  BUN 21 15  CREATININE 0.80 0.81  CALCIUM 10.0 8.4   Liver Function Tests:  Recent Labs Lab 02/10/14 1555 02/11/14 0646  AST 21 18  ALT 16 14  ALKPHOS 98 77  BILITOT 0.7 0.6  PROT 9.6* 7.3  ALBUMIN 3.8 3.0*   No results found for this basename: LIPASE, AMYLASE,  in the last 168 hours No results found for this basename: AMMONIA,  in the last 168 hours CBC:  Recent Labs Lab 02/10/14 1555 02/11/14 0646  WBC 5.0 2.7*  NEUTROABS 3.3  --   HGB 14.8 12.8*  HCT 41.6 36.3*  MCV 92.0 90.5  PLT 149* 120*    Cardiac Enzymes: No results found for this basename: CKTOTAL, CKMB, CKMBINDEX, TROPONINI,  in the last 168 hours BNP: No results found for this basename: PROBNP,  in the last 168 hours D-Dimer: No results found for this basename: DDIMER,  in the last 168 hours CBG: No results found for this basename: GLUCAP,  in the last 168 hours Hemoglobin A1C: No results found for this basename: HGBA1C,  in the last 168 hours Fasting Lipid Panel: No results found for this basename: CHOL, HDL, LDLCALC, TRIG, CHOLHDL, LDLDIRECT,  in the last 168 hours Thyroid Function Tests:  Recent Labs Lab 02/11/14 0646  TSH 4.880*   Coagulation: No results found for this basename: LABPROT, INR,  in the last 168 hours Anemia Panel: No results found for this basename: VITAMINB12, FOLATE, FERRITIN, TIBC, IRON, RETICCTPCT,  in the last 168 hours Urine Drug Screen: Drugs of Abuse  No results found for this basename: labopia, cocainscrnur, labbenz, amphetmu, thcu, labbarb    Alcohol Level: No results found for this basename: ETH,  in the last 168 hours Urinalysis:  Recent Labs Lab 02/10/14 1830  COLORURINE YELLOW  LABSPEC 1.006  PHURINE 6.5  GLUCOSEU NEGATIVE  HGBUR NEGATIVE  BILIRUBINUR NEGATIVE  KETONESUR NEGATIVE  PROTEINUR NEGATIVE  UROBILINOGEN 0.2  NITRITE NEGATIVE  LEUKOCYTESUR NEGATIVE  Misc. Labs:   Micro Results: Recent Results (from the past 240 hour(s))  CULTURE, BLOOD (ROUTINE X 2)     Status: None   Collection Time    02/10/14  3:55 PM      Result Value Ref Range Status   Specimen Description BLOOD LEFT HAND   Final   Special Requests NONE BOTTLES DRAWN AEROBIC AND ANAEROBIC 5CC EACH   Final   Culture  Setup Time     Final   Value: 02/10/2014 20:05     Performed at [facility]   Culture     Final   Value:        BLOOD CULTURE RECEIVED NO GROWTH TO DATE CULTURE WILL BE HELD FOR 5 DAYS BEFORE ISSUING A FINAL NEGATIVE REPORT     Performed at [facility]    Report Status PENDING   Incomplete  CULTURE, BLOOD (ROUTINE X 2)     Status: None   Collection Time    02/10/14  3:55 PM      Result Value Ref Range Status   Specimen Description BLOOD RIGHT FOREHAND   Final   Special Requests NONE BOTTLES DRAWN AEROBIC AND ANAEROBIC 5CC EACH   Final   Culture  Setup Time     Final   Value: 02/10/2014 20:06     Performed at [facility]   Culture     Final   Value:        BLOOD CULTURE RECEIVED NO GROWTH TO DATE CULTURE WILL BE HELD FOR 5 DAYS BEFORE ISSUING A FINAL NEGATIVE REPORT     Performed at [facility]   Report Status PENDING   Incomplete   Studies/Results: [place] Misc Soft Tissue  02/10/2014   CLINICAL DATA:  Left forearm cellulitis, please assess for abscess  EXAM: SOFT TISSUE ULTRASOUND - MISCELLANEOUS  TECHNIQUE: Realtime sonography of the left forearm with a high frequency linear transducer.  COMPARISON:  None.  FINDINGS: There is generalized soft tissue edema of the left ventral forearm. There is no focal fluid collection or soft tissue mass. There is no hematoma.  IMPRESSION: Generalized soft tissue edema of the left ventral forearm which may be reactive versus secondary to cellulitis. No drainable fluid collection to suggest an abscess.   Electronically Signed   By: Elige Ko   On: 02/10/2014 18:48   Dg Chest Port 1 View  02/10/2014   CLINICAL DATA:  Bit by insect on August 21st. Has been lightheaded with nausea and shortness of breath  EXAM: PORTABLE CHEST - 1 VIEW  COMPARISON:  05/05/2011  FINDINGS: The heart size and mediastinal contours are within normal limits. Both lungs are clear. The visualized skeletal structures are unremarkable.  IMPRESSION: No active disease.   Electronically Signed   By: Elige Ko   On: 02/10/2014 16:22   Medications: I have reviewed the patient's current medications. Scheduled Meds: . aspirin EC  81 mg Oral Daily  . clonazePAM  0.5 mg Oral BID  . dapsone  100 mg Oral Daily  .  darunavir-cobicistat  1 tablet Oral Daily  . emtricitabine-tenofovir  1 tablet Oral Daily  . enoxaparin (LOVENOX) injection  40 mg Subcutaneous Q24H  . magic mouthwash w/lidocaine  5 mL Oral QID  . PARoxetine  40 mg Oral Daily  . vancomycin  1,250 mg Intravenous Q12H   Continuous Infusions: . sodium chloride 1,000 mL (02/11/14 0959)   PRN Meds:.acetaminophen, HYDROcodone-acetaminophen, LORazepam, ondansetron (ZOFRAN) IV, ondansetron Assessment/Plan: Active Problems:  Cellulitis   53 yo with hx of uncontrolled HIV here with possible insect bite and cellulitis.  LUE  cellulitis [place] of soft tissue demonstrates generalized soft tissue edema and no drainable fluid collection. Received vanc and 3.375g IV zosyn in the ED and today. - improving.-follow up blood cultures NGTD. -checked hgb A1c is 5.5 today and TSH high 4.880.  -send home with doxy  BID for 10 days.  HIV: last VL 306,000, CD4 160 09/17/2013. Has not taken medications for past month as he has trouble getting to the pharmacy. CXR negative for active disease. UA negative as well.  -Recheck HIV VL pending and CD4 count is 80 -continue Prezcobix 800/150mg  daily  -continue Truvada 200/300mg  daily  Mouth soreness: history of candidal esophagitis and dental caries. Soreness likely 2/2 tooth extraction. No lesions or abscesses identified on exam.  -continue magic mouthwash w/lidocaine   Groin rash: appearance consistent with tinea cruris. Has oncychomycosis which increases the risk of tinea cruris.  - have him follow up with ID about this outpatient. Onycholysis: appearance consistent with onychomycosis  -consider terbinafine 250 mg daily by mouth for 12 weeks - will leave this upto his ID physician.  Thrombocytopenia: 149 at admission. Last Plt 127 on 09/17/2013. Around baselint 120 today. -continue to monitor outpatient.  Depression, anxiety:  -continue home Klonopin 0.5mg  BID  -continue Ativan  Q6hr prn anxiety    -continue Paxil  daily   FEN:  -Regular diet  DVT ppx: lovenox  daily   Dispo: Disposition is deferred at this time, awaiting improvement of current medical problems.  Anticipated discharge in approximately 2-3 day(s).   The patient does have a current PCP [name], MD) and does need an [facility] hospital follow-up appointment after discharge.  The patient does have transportation limitations that hinder transportation to clinic appointments.  .Services Needed at time of discharge: Y = Yes, Blank = No PT:   OT:   RN:   Equipment:   Other:     LOS: 1 day   Hyacinth Meeker, MD 02/11/2014, 11:53 AM

## 2014-02-11 NOTE — H&P (Signed)
  Date: 02/11/2014  Patient name: SHAHID FLORI  Medical record number: [id number]  Date of birth: Nov 01, 1960   I have seen and evaluated [facility] Parents and discussed their care with the Residency Team. Mr Weyenberg has known HIV and has not taken meds for one month due to transportation issues getting to pharmacy. He sees Dr Orvan Falconer and his last CD4 was 160 08/2013 with VL 306,000. He was admitted for cellulitis of the L forearm bc of concerns over compliance. He was started on Vanc and has had sig improvement on after the 2 doses.   Repeat CD4 came back at 80. T max was 99.6. WBC back to baseline (never elevated).   Pt is in NAD. He is warm to touch. L forearm has slight erythema and small area of focal edema. Groin - slight erythema in groins B with few scattered satellite lesions. Toenails thick and discoloured B. Oral cavity - no thrush. Poor dentition.   Assessment and Plan: I have seen and evaluated the patient as outlined above. I agree with the formulated Assessment and Plan as detailed in the residents' admission note, with the following changes:   1. LUE cellulitis - pt has responded well to the Vanc and may be transitioned to oral Doxy to complete PO course at home. He will need close F/U ID to assure resolution.  2. Presumed tinea cruris - his exam has only mild changes but may treat topically.   3. Transportation issue - refer pt to [facility] and try to set up home pharmacy med delivery.   Burns Spain, MD 8/25/20152:21 PM

## 2014-02-11 NOTE — Progress Notes (Signed)
Received referral for [facility] Care Management services from inpatient RNCM. However, unable to accept referral at this time as patient's PCP is a Barrister's clerk. Will make inpatient RNCM aware. Appreciative of referral however.  Raiford Noble, MSN- RN,BSN- [facility] [phone]

## 2014-02-11 NOTE — Discharge Summary (Signed)
Name: Jordan Calderon MRN: [id number] DOB: Aug 07, 1960 53 y.o. PCP: [name], MD  Date of Admission: 02/10/2014  3:04 PM Date of Discharge: 02/11/2014 Attending Physician: Burns Spain, MD  Discharge Diagnosis:  Active Problems:   Cellulitis  Discharge Medications:   Medication List         clonazePAM 0.5 MG tablet  Commonly known as:  KLONOPIN  Take 1 tablet (0.5 mg total) by mouth 2 (two) times daily.     dapsone 100 MG tablet  Take 1 tablet (100 mg total) by mouth daily.     darunavir-cobicistat 800-150 MG per tablet  Commonly known as:  PREZCOBIX  Take 1 tablet by mouth daily. Swallow whole. Do NOT crush, break or chew tablets. Take with food.     doxycycline 50 MG capsule  Commonly known as:  VIBRAMYCIN  Take 2 capsules (100 mg total) by mouth 2 (two) times daily.     emtricitabine-tenofovir 200-300 MG per tablet  Commonly known as:  TRUVADA  Take 1 tablet by mouth daily.     LORazepam 2 MG tablet  Commonly known as:  ATIVAN  Take 2 mg by mouth every 6 (six) hours as needed for anxiety.     magic mouthwash w/lidocaine Soln  Take 5 mLs by mouth 4 (four) times daily.     PARoxetine 40 MG tablet  Commonly known as:  PAXIL  Take 1 tablet (40 mg total) by mouth daily.        Disposition and follow-up:   Jordan Calderon was discharged from [facility] in Good condition.  At the hospital follow up visit please address:  1.  Please address his medication noncompliance. We have enrolled him with [facility] for home delivery but they will evaluate him to see if he qualifies. In the mean time, he was asked to get his meds from [facility]. Please consider starting synthroid as his TSH was borderline high.   We have asked him to take doxycycline for 10 days.   2.  Labs / imaging needed at time of follow-up:  3.  Pending labs/ test needing follow-up: HIV viral load, Blood cultures.   Follow-up  Appointments: Follow-up Information   Schedule an appointment as soon as possible for a visit with [name], MD.   Specialty:  Infectious Diseases   Contact information:   [address] [place] [address] [phone]       Discharge Instructions:   Consultations:    Procedures Performed:  [place] Misc Soft Tissue  02/10/2014   CLINICAL DATA:  Left forearm cellulitis, please assess for abscess  EXAM: SOFT TISSUE ULTRASOUND - MISCELLANEOUS  TECHNIQUE: Realtime sonography of the left forearm with a high frequency linear transducer.  COMPARISON:  None.  FINDINGS: There is generalized soft tissue edema of the left ventral forearm. There is no focal fluid collection or soft tissue mass. There is no hematoma.  IMPRESSION: Generalized soft tissue edema of the left ventral forearm which may be reactive versus secondary to cellulitis. No drainable fluid collection to suggest an abscess.   Electronically Signed   By: Elige Ko   On: 02/10/2014 18:48   Dg Chest Port 1 View  02/10/2014   CLINICAL DATA:  Bit by insect on August 21st. Has been lightheaded with nausea and shortness of breath  EXAM: PORTABLE CHEST - 1 VIEW  COMPARISON:  05/05/2011  FINDINGS: The heart size and mediastinal contours are within normal limits.  Both lungs are clear. The visualized skeletal structures are unremarkable.  IMPRESSION: No active disease.   Electronically Signed   By: Elige Ko   On: 02/10/2014 16:22    2D Echo:   Cardiac Cath:   Admission HPI:   Jordan Calderon is a 53 year old man with history of HIV noncompliant on therapy (VL 306,000, CD4 160 09/17/2013) presenting with left arm redness. Saturday he worked outdoors. Saturday night he had nausea. Sunday he noticed a 2 non pruritic, painful red bumps on his proximal lateral forearm. The erythema has increased. Denies drainage. He also reports sore mouth - recently had a tooth extraction. He reports a left sided headache and left sided  soreness. Also reports generalized weakness. Denies fevers, chills, diaphoresis, emesis, abdominal pain, diarrhea. Suspects insect bite, but never saw an insect. He also notes that he has a rash around his genitals. He has not taken his HIV medications for a month.  Hospital Course by problem list: Active Problems:   Cellulitis   53 yo with hx of uncontrolled HIV here with possible insect bite and cellulitis.  LUE cellulitis [place] of soft tissue demonstrates generalized soft tissue edema and no drainable fluid collection. Received vanc and 3.375g IV zosyn in the ED and today.  - improving.-follow up blood cultures NGTD.  -checked hgb A1c is 5.5 today and TSH high 4.880. Consider synthroid by PCP.  -send home with doxy  BID for 10 days.  HIV: last VL 306,000, CD4 160 09/17/2013. Has not taken medications for past month as he has trouble getting to the pharmacy. CXR negative for active disease. UA negative as well.  -Recheck HIV VL pending and CD4 count is 80  -continue Prezcobix 800/150mg  daily  -continue Truvada 200/300mg  daily  Mouth soreness: history of candidal esophagitis and dental caries. Soreness likely 2/2 tooth extraction. No lesions or abscesses identified on exam.  -continue magic mouthwash w/lidocaine  Groin rash: appearance consistent with tinea cruris. Has oncychomycosis which increases the risk of tinea cruris.  - have him follow up with ID about this outpatient.  Onycholysis: appearance consistent with onychomycosis  -consider terbinafine 250 mg daily by mouth for 12 weeks - will leave this upto his ID physician.  Thrombocytopenia: 149 at admission. Last Plt 127 on 09/17/2013. Around baselint 120 today.  -continue to monitor outpatient.  Depression, anxiety:  -continue home Klonopin 0.5mg  BID  -continue Ativan  Q6hr prn anxiety  -continue Paxil  daily      Discharge Vitals:   BP 108/67  Pulse 77  Temp(Src) 98.1 F (36.7 C) (Oral)  Resp 16  Ht  (1.753  m)  Wt 155 lb (70.308 kg)  BMI 22.88 kg/m2  SpO2 100%  Discharge Labs:  Results for orders placed during the hospital encounter of 02/10/14 (from the past 24 hour(s))  URINALYSIS, ROUTINE W REFLEX MICROSCOPIC     Status: None   Collection Time    02/10/14  6:30 PM      Result Value Ref Range   Color, Urine YELLOW  YELLOW   APPearance CLEAR  CLEAR   Specific Gravity, Urine 1.006  1.005 - 1.030   pH 6.5  5.0 - 8.0   Glucose, UA NEGATIVE  NEGATIVE mg/dL   Hgb urine dipstick NEGATIVE  NEGATIVE   Bilirubin Urine NEGATIVE  NEGATIVE   Ketones, ur NEGATIVE  NEGATIVE mg/dL   Protein, ur NEGATIVE  NEGATIVE mg/dL   Urobilinogen, UA 0.2  0.0 - 1.0 mg/dL   Nitrite NEGATIVE  NEGATIVE   Leukocytes, UA NEGATIVE  NEGATIVE  COMPREHENSIVE METABOLIC PANEL     Status: Abnormal   Collection Time    02/11/14  6:46 AM      Result Value Ref Range   Sodium 137  137 - 147 mEq/L   Potassium 4.0  3.7 - 5.3 mEq/L   Chloride 104  96 - 112 mEq/L   CO2 22  19 - 32 mEq/L   Glucose, Bld 80  70 - 99 mg/dL   BUN 15  6 - 23 mg/dL   Creatinine, Ser 1.61  0.50 - 1.35 mg/dL   Calcium 8.4  8.4 - 09.6 mg/dL   Total Protein 7.3  6.0 - 8.3 g/dL   Albumin 3.0 (*) 3.5 - 5.2 g/dL   AST 18  0 - 37 U/L   ALT 14  0 - 53 U/L   Alkaline Phosphatase 77  39 - 117 U/L   Total Bilirubin 0.6  0.3 - 1.2 mg/dL   GFR calc non Af Amer >90  >90 mL/min   GFR calc Af Amer >90  >90 mL/min   Anion gap 11  5 - 15  CBC     Status: Abnormal   Collection Time    02/11/14  6:46 AM      Result Value Ref Range   WBC 2.7 (*) 4.0 - 10.5 K/uL   RBC 4.01 (*) 4.22 - 5.81 MIL/uL   Hemoglobin 12.8 (*) 13.0 - 17.0 g/dL   HCT 04.5 (*) 40.9 - 81.1 %   MCV 90.5  78.0 - 100.0 fL   MCH 31.9  26.0 - 34.0 pg   MCHC 35.3  30.0 - 36.0 g/dL   RDW 91.4  78.2 - 95.6 %   Platelets 120 (*) 150 - 400 K/uL  TSH     Status: Abnormal   Collection Time    02/11/14  6:46 AM      Result Value Ref Range   TSH 4.880 (*) 0.350 - 4.500 uIU/mL  T-HELPER CELLS  (CD4) COUNT     Status: Abnormal   Collection Time    02/11/14  6:46 AM      Result Value Ref Range   CD4 T Cell Abs 80 (*) 400 - 2700 /uL   CD4 % Helper T Cell 9 (*) 33 - 55 %  HEMOGLOBIN A1C     Status: None   Collection Time    02/11/14  6:46 AM      Result Value Ref Range   Hemoglobin A1C 5.5  <5.7 %   Mean Plasma Glucose 111  <117 mg/dL    Signed: Hyacinth Meeker, MD 02/11/2014, 5:01 PM    Services Ordered on Discharge:  Equipment Ordered on Discharge:

## 2014-02-11 NOTE — Progress Notes (Signed)
Waiting for transportation home, no c/o voiced. Has D/C instructions.

## 2014-02-11 NOTE — Care Management Note (Signed)
  Page 1 of 1   02/11/2014     1:26:43 PM CARE MANAGEMENT NOTE 02/11/2014  Patient:  Jordan Calderon, Jordan Calderon   Account Number:  [id number]  Date Initiated:  02/11/2014  Documentation initiated by:  Ronny Flurry  Subjective/Objective Assessment:     Action/Plan:   Anticipated DC Date:     Anticipated DC Plan:    In-house referral  Clinical Social Worker         Choice offered to / List presented to:             Status of service:   Medicare Important Message given?   (If response is "NO", the following Medicare IM given date fields will be blank) Date Medicare IM given:   Medicare IM given by:   Date Additional Medicare IM given:   Additional Medicare IM given by:    Discharge Disposition:    Per UR Regulation:    If discussed at Long Length of Stay Meetings, dates discussed:    Comments:  02-11-14 Emailed [facility] consult to [employer]. Ronny Flurry RN BSN [phone]   02-11-14 Patient having problems getting medications , he has Medicare / Medicaid , problem is transportation, should have Medicaid transportation. Consulted Child psychotherapist. Ronny Flurry RN BSN [phone]

## 2014-02-11 NOTE — Clinical Social Work Note (Signed)
CSW received consult for patient concerning transportation needs.  Patient reported having trouble remembering to take his HIV medications regularly when he was feeling well because there were no symptomatic reminders to take his medication.  Patient also spoke about his recent break up with a long term partner and the emotional toll that has had on him.  Patient reported feeling depressed lately because of his breakup and also because his sister has taken on a more controlling role in his life than desired.  Patient stated that he has had past suicidal ideation and will sometimes intentionally not take his medications with the hope that he will become ill.  CSW followed up with patient on suicidal ideation and confirmed that the patient does not feel suicidal currently and does not have an immediate plan to hurt himself.  Patient stated that his PCP, Dr. Orvan Falconer, has previously helped him get in contact with a therapist to talk through these issues.  CSW encouraged patient to follow up with Dr. Orvan Falconer about getting back in touch with his therapist.    CSW discussed Medicaid transportation with the client who reported that his sister, [name], should be contacted to set that up since she often works with Medicare/Medicaid on his behalf.  CSW followed up with Dr. Lovina Reach office and spoke with a nurse, Alvy Beal, about the patient.  CSW requested that the nurse inform Dr. Orvan Falconer that the patient wants to follow up with therapy services.  CSW asked about social work resources in the outpatient setting and the nurse reported that there is a [employer], [employer] that provides patients with transportation short-term and works with patients for long-term plans.  Nurse will provide referral for the patient.  Merlyn Lot, LCSWA Clinical Social Worker [phone]

## 2014-02-11 NOTE — Discharge Instructions (Signed)
It was a pleasure taking care of you. You were admitted with cellulitis. You need to take doxycycline for 10 days. Follow up with your primary care/infectious disease doctor.   Please make sure you take your medication as prescribed.

## 2014-02-12 LAB — URINE CULTURE
COLONY COUNT: NO GROWTH
CULTURE: NO GROWTH

## 2014-02-12 LAB — HIV-1 RNA QUANT-NO REFLEX-BLD
HIV 1 RNA Quant: 574375 copies/mL — ABNORMAL HIGH (ref <20)
HIV-1 RNA Quant, Log: 5.76 {Log} — ABNORMAL HIGH (ref <1.30)

## 2014-02-12 NOTE — Telephone Encounter (Signed)
Jordan Calderon was discharged from the hospital yesterday. I called his home number tonight and got his phone message. I asked him to call the clinic and make a followup appointment with me.

## 2014-02-16 LAB — CULTURE, BLOOD (ROUTINE X 2)
Culture: NO GROWTH
Culture: NO GROWTH

## 2014-03-15 ENCOUNTER — Telehealth: Payer: Self-pay | Admitting: Internal Medicine

## 2014-03-15 ENCOUNTER — Other Ambulatory Visit: Payer: Self-pay | Admitting: Internal Medicine

## 2014-03-15 MED ORDER — LORAZEPAM 2 MG PO TABS: 2.0000 mg | ORAL_TABLET | Freq: Four times a day (QID) | ORAL | Status: DC | PRN

## 2014-03-15 NOTE — Telephone Encounter (Signed)
Patient called Saturday 9/26 for lorazepam refill.  Given short supply of 8 tabs of 2 mg and further refills will need to be given after weekend.  Primary provider notified.

## 2014-03-17 ENCOUNTER — Other Ambulatory Visit: Payer: Self-pay | Admitting: *Deleted

## 2014-03-17 ENCOUNTER — Telehealth: Payer: Self-pay | Admitting: *Deleted

## 2014-03-17 DIAGNOSIS — F411 Generalized anxiety disorder: Secondary | ICD-10-CM

## 2014-03-17 MED ORDER — LORAZEPAM 2 MG PO TABS: 2.0000 mg | ORAL_TABLET | Freq: Two times a day (BID) | ORAL | Status: DC | PRN

## 2014-03-17 NOTE — Telephone Encounter (Signed)
Please give him 36 tablets of lorazepam 2 mg to be taken twice daily as needed for anxiety. This should last him through his upcoming appointment with me on October 15. Please let Aundra know that he will need to keep that appointment in order to get further refills.

## 2014-03-17 NOTE — Telephone Encounter (Signed)
Called in as prescribed.  Patient aware. Andree Coss, RN

## 2014-03-17 NOTE — Telephone Encounter (Signed)
Pt requesting Controlled Substance refill.  MD OK needed.

## 2014-04-03 ENCOUNTER — Encounter: Payer: Self-pay | Admitting: Internal Medicine

## 2014-04-03 ENCOUNTER — Telehealth: Payer: Self-pay | Admitting: *Deleted

## 2014-04-03 ENCOUNTER — Ambulatory Visit (INDEPENDENT_AMBULATORY_CARE_PROVIDER_SITE_OTHER): Payer: Medicare Other | Admitting: Internal Medicine

## 2014-04-03 VITALS — BP 158/98 | HR 89 | Temp 98.3°F | Wt 163.8 lb

## 2014-04-03 DIAGNOSIS — F411 Generalized anxiety disorder: Secondary | ICD-10-CM

## 2014-04-03 DIAGNOSIS — B20 Human immunodeficiency virus [HIV] disease: Secondary | ICD-10-CM

## 2014-04-03 DIAGNOSIS — Z23 Encounter for immunization: Secondary | ICD-10-CM

## 2014-04-03 LAB — CBC
HCT: 39.4 % (ref 39.0–52.0)
Hemoglobin: 13.8 g/dL (ref 13.0–17.0)
MCH: 34.1 pg — ABNORMAL HIGH (ref 26.0–34.0)
MCHC: 35 g/dL (ref 30.0–36.0)
MCV: 97.3 fL (ref 78.0–100.0)
PLATELETS: 186 10*3/uL (ref 150–400)
RBC: 4.05 MIL/uL — AB (ref 4.22–5.81)
RDW: 14.7 % (ref 11.5–15.5)
WBC: 5.5 10*3/uL (ref 4.0–10.5)

## 2014-04-03 LAB — COMPREHENSIVE METABOLIC PANEL
ALBUMIN: 4.3 g/dL (ref 3.5–5.2)
ALK PHOS: 70 U/L (ref 39–117)
ALT: 13 U/L (ref 0–53)
AST: 18 U/L (ref 0–37)
BUN: 14 mg/dL (ref 6–23)
CO2: 27 meq/L (ref 19–32)
Calcium: 9.5 mg/dL (ref 8.4–10.5)
Chloride: 102 mEq/L (ref 96–112)
Creat: 1 mg/dL (ref 0.50–1.35)
Glucose, Bld: 88 mg/dL (ref 70–99)
POTASSIUM: 4.4 meq/L (ref 3.5–5.3)
SODIUM: 136 meq/L (ref 135–145)
TOTAL PROTEIN: 7.6 g/dL (ref 6.0–8.3)
Total Bilirubin: 0.4 mg/dL (ref 0.2–1.2)

## 2014-04-03 MED ORDER — LORAZEPAM 2 MG PO TABS: 2.0000 mg | ORAL_TABLET | Freq: Two times a day (BID) | ORAL | Status: DC | PRN

## 2014-04-03 NOTE — Progress Notes (Signed)
Patient ID: Ellis Parentsodd C Kiser, male   DOB: 16-Oct-1960, 53 y.o.   MRN: [id number]          Patient Active Problem List   Diagnosis Date Noted  . Human immunodeficiency virus (HIV) disease 04/06/2006    Priority: High  . Cigarette smoker 07/29/2008    Priority: Medium  . ANXIETY 04/06/2006    Priority: Medium  . SUBSTANCE ABUSE, MULTIPLE 04/06/2006    Priority: Medium  . DEPRESSION 04/06/2006    Priority: Medium  . Cellulitis 02/10/2014  . UPPER RESPIRATORY INFECTION, ACUTE 07/28/2009  . FRACTURE, RIB, LEFT 07/28/2009  . HEPATITIS C 04/06/2006  . CANDIDAL ESOPHAGITIS 04/06/2006  . DENTAL CARIES 04/06/2006  . ANAL FISSURE 04/06/2006    Patient's Medications  New Prescriptions   No medications on file  Previous Medications   CLONAZEPAM (KLONOPIN) 0.5 MG TABLET    Take 1 tablet (0.5 mg total) by mouth 2 (two) times daily.   DAPSONE 100 MG TABLET    Take 1 tablet (100 mg total) by mouth daily.   DARUNAVIR-COBICISTAT (PREZCOBIX) 800-150 MG PER TABLET    Take 1 tablet by mouth daily. Swallow whole. Do NOT crush, break or chew tablets. Take with food.   EMTRICITABINE-TENOFOVIR (TRUVADA) 200-300 MG PER TABLET    Take 1 tablet by mouth daily.   LORAZEPAM (ATIVAN) 2 MG TABLET    Take 1 tablet (2 mg total) by mouth 2 (two) times daily as needed for anxiety.   PAROXETINE (PAXIL) 40 MG TABLET    Take 1 tablet (40 mg total) by mouth daily.  Modified Medications   No medications on file  Discontinued Medications   ALUM & MAG HYDROXIDE-SIMETH (MAGIC MOUTHWASH W/LIDOCAINE) SOLN    Take 5 mLs by mouth 4 (four) times daily.   DOXYCYCLINE (VIBRAMYCIN) 50 MG CAPSULE    Take 2 capsules (100 mg total) by mouth 2 (two) times daily.    Subjective: [name] is in for his hospital followup visit. He was hospitalized in late August with a left forearm soft tissue infection. He improved with empiric antibiotics and was discharged on a brief course of doxycycline. He states that he had not been taking his  antiretroviral medications prior to hospitalization. He was losing weight and becoming worried that he was starting to get sick. Once he was hospitalized he made a decision to start his medication and he says he has not missed any of his HIV meds since that time. He still has the full support of his sister [name]. He says he has also stopped smoking cigarettes. He has no problems tolerating his medications. Review of Systems: Pertinent items are noted in HPI.  Past Medical History  Diagnosis Date  . HIV (human immunodeficiency virus infection)   . Cellulitis 02/11/2014    LEFT ARM  . Depression   . WUJWJXBJ(478.2Headache(784.0)     History  Substance Use Topics  . Smoking status: Former Smoker -- 0.10 packs/day for 30 years    Types: Cigarettes    Quit date: 01/21/2014  . Smokeless tobacco: Never Used  . Alcohol Use: No    No family history on file.  Allergies  Allergen Reactions  . Sulfonamide Derivatives     REACTION: itching    Objective: Temp: 98.3 F (36.8 C) (10/15 1524) Temp Source: Oral (10/15 1524) BP: 158/98 mmHg (10/15 1524) Pulse Rate: 89 (10/15 1524) Body mass index is 24.17 kg/(m^2).  General: He is not as nervous as usual and he is in good spirits  Oral: No oropharyngeal lesions Skin: No rash. He has a small nodule at the site of his left forearm infection without any evidence of active infection Lungs: Clear Cor: Regular S1 and S2 no murmur   Lab Results Lab Results  Component Value Date   WBC 2.7* 02/11/2014   HGB 12.8* 02/11/2014   HCT 36.3* 02/11/2014   MCV 90.5 02/11/2014   PLT 120* 02/11/2014    Lab Results  Component Value Date   CREATININE 0.81 02/11/2014   BUN 15 02/11/2014   NA 137 02/11/2014   K 4.0 02/11/2014   CL 104 02/11/2014   CO2 22 02/11/2014    Lab Results  Component Value Date   ALT 14 02/11/2014   AST 18 02/11/2014   ALKPHOS 77 02/11/2014   BILITOT 0.6 02/11/2014    Lab Results  Component Value Date   CHOL 147 09/17/2013   HDL 29* 09/17/2013     LDLCALC 98 09/17/2013   TRIG 101 09/17/2013   CHOLHDL 5.1 09/17/2013    Lab Results HIV 1 RNA Quant (copies/mL)  Date Value  02/11/2014 574375*  09/17/2013 [id number]*  11/06/2012 948*     CD4 T Cell Abs (/uL)  Date Value  02/11/2014 80*  09/17/2013 160*  11/06/2012 240*     Assessment: It appears that Eyad's adherence has improved recently. He has regained some of the weight he has lost.  Plan: 1. Continue current antiretroviral medications and dapsone 2. Repeat lab work today 3. Followup in 3-4 weeks   Cliffton AstersJohn Elain Wixon, MD [facility] [phone] pager   [phone] cell 04/03/2014, 3:49 PM

## 2014-04-03 NOTE — Telephone Encounter (Signed)
Unable to reach pt to let him know that rx was phoned in.

## 2014-04-04 LAB — T-HELPER CELL (CD4) - (RCID CLINIC ONLY)
CD4 T CELL ABS: 260 /uL — AB (ref 400–2700)
CD4 T CELL HELPER: 15 % — AB (ref 33–55)

## 2014-04-07 LAB — HIV-1 RNA QUANT-NO REFLEX-BLD
HIV 1 RNA QUANT: 1109 {copies}/mL — AB (ref <20)
HIV-1 RNA QUANT, LOG: 3.04 {Log} — AB (ref <1.30)

## 2014-05-06 ENCOUNTER — Ambulatory Visit (INDEPENDENT_AMBULATORY_CARE_PROVIDER_SITE_OTHER): Payer: Medicare Other | Admitting: Internal Medicine

## 2014-05-06 ENCOUNTER — Encounter: Payer: Self-pay | Admitting: Internal Medicine

## 2014-05-06 VITALS — BP 141/84 | HR 63 | Temp 98.2°F | Wt 164.2 lb

## 2014-05-06 DIAGNOSIS — B20 Human immunodeficiency virus [HIV] disease: Secondary | ICD-10-CM

## 2014-05-06 NOTE — Progress Notes (Signed)
Patient ID: Jordan Calderon, male   DOB: Jun 25, 1960, 53 y.o.   MRN: [id number]          Patient Active Problem List   Diagnosis Date Noted  . Human immunodeficiency virus (HIV) disease 04/06/2006    Priority: High  . Cigarette smoker 07/29/2008    Priority: Medium  . ANXIETY 04/06/2006    Priority: Medium  . SUBSTANCE ABUSE, MULTIPLE 04/06/2006    Priority: Medium  . DEPRESSION 04/06/2006    Priority: Medium  . Cellulitis 02/10/2014  . UPPER RESPIRATORY INFECTION, ACUTE 07/28/2009  . FRACTURE, RIB, LEFT 07/28/2009  . HEPATITIS C 04/06/2006  . CANDIDAL ESOPHAGITIS 04/06/2006  . DENTAL CARIES 04/06/2006  . ANAL FISSURE 04/06/2006    Patient's Medications  New Prescriptions   No medications on file  Previous Medications   CLONAZEPAM (KLONOPIN) 0.5 MG TABLET    Take 1 tablet (0.5 mg total) by mouth 2 (two) times daily.   DAPSONE 100 MG TABLET    Take 1 tablet (100 mg total) by mouth daily.   DARUNAVIR-COBICISTAT (PREZCOBIX) 800-150 MG PER TABLET    Take 1 tablet by mouth daily. Swallow whole. Do NOT crush, break or chew tablets. Take with food.   EMTRICITABINE-TENOFOVIR (TRUVADA) 200-300 MG PER TABLET    Take 1 tablet by mouth daily.   LORAZEPAM (ATIVAN) 2 MG TABLET    Take 1 tablet (2 mg total) by mouth 2 (two) times daily as needed for anxiety.   PAROXETINE (PAXIL) 40 MG TABLET    Take 1 tablet (40 mg total) by mouth daily.  Modified Medications   No medications on file  Discontinued Medications   No medications on file    Subjective: Jordan Calderon is in for his routine visit. He states he has not missed a single dose of his Truvada or Prezcobix since his last visit. He is tolerating his medications well. Review of Systems: Pertinent items are noted in HPI.  Past Medical History  Diagnosis Date  . HIV (human immunodeficiency virus infection)   . Cellulitis 02/11/2014    LEFT ARM  . Depression   . WUJWJXBJ(478.2Headache(784.0)     History  Substance Use Topics  . Smoking status:  Former Smoker -- 0.10 packs/day for 30 years    Types: Cigarettes    Quit date: 01/21/2014  . Smokeless tobacco: Never Used  . Alcohol Use: No    No family history on file.  Allergies  Allergen Reactions  . Sulfonamide Derivatives     REACTION: itching    Objective: Temp: 98.2 F (36.8 C) (11/17 0950) Temp Source: Oral (11/17 0950) BP: 141/84 mmHg (11/17 0950) Pulse Rate: 63 (11/17 0950) Body mass index is 24.24 kg/(m^2).  General: smiling and in good spirits Oral: no oropharyngeal lesions Skin: no rash Lungs: clear Cor: regular S1 and S2 with no murmurs Mood and affect: Not anxious or depressed  Lab Results Lab Results  Component Value Date   WBC 5.5 04/03/2014   HGB 13.8 04/03/2014   HCT 39.4 04/03/2014   MCV 97.3 04/03/2014   PLT 186 04/03/2014    Lab Results  Component Value Date   CREATININE 1.00 04/03/2014   BUN 14 04/03/2014   NA 136 04/03/2014   K 4.4 04/03/2014   CL 102 04/03/2014   CO2 27 04/03/2014    Lab Results  Component Value Date   ALT 13 04/03/2014   AST 18 04/03/2014   ALKPHOS 70 04/03/2014   BILITOT 0.4 04/03/2014    Lab  Results  Component Value Date   CHOL 147 09/17/2013   HDL 29* 09/17/2013   LDLCALC 98 09/17/2013   TRIG 101 09/17/2013   CHOLHDL 5.1 09/17/2013    Lab Results HIV 1 RNA QUANT (copies/mL)  Date Value  04/03/2014 1109*  02/11/2014 [id number]*  09/17/2013 [id number]*   CD4 T CELL ABS (/uL)  Date Value  04/03/2014 260*  02/11/2014 80*  09/17/2013 160*     Assessment: Jordan Calderon is doing much better.  Plan: 1. Continue current antiretroviral regimen 2. Discontinue dapsone 3. Repeat CD4 and viral load today 4. Follow-up in 4 weeks   Jordan AstersJohn Alquan Morrish, MD [facility] [phone] pager   [phone] cell 05/06/2014, 10:11 AM

## 2014-05-07 LAB — T-HELPER CELL (CD4) - (RCID CLINIC ONLY)
CD4 T CELL HELPER: 11 % — AB (ref 33–55)
CD4 T Cell Abs: 210 /uL — ABNORMAL LOW (ref 400–2700)

## 2014-05-07 LAB — HIV-1 RNA QUANT-NO REFLEX-BLD
HIV 1 RNA Quant: 162 copies/mL — ABNORMAL HIGH (ref <20)
HIV-1 RNA Quant, Log: 2.21 {Log} — ABNORMAL HIGH (ref <1.30)

## 2014-06-03 ENCOUNTER — Ambulatory Visit (INDEPENDENT_AMBULATORY_CARE_PROVIDER_SITE_OTHER): Payer: Medicare Other | Admitting: Internal Medicine

## 2014-06-03 ENCOUNTER — Encounter: Payer: Self-pay | Admitting: Internal Medicine

## 2014-06-03 VITALS — BP 146/88 | HR 83 | Temp 98.6°F | Wt 163.0 lb

## 2014-06-03 DIAGNOSIS — B20 Human immunodeficiency virus [HIV] disease: Secondary | ICD-10-CM

## 2014-06-03 DIAGNOSIS — F411 Generalized anxiety disorder: Secondary | ICD-10-CM

## 2014-06-03 DIAGNOSIS — Z79899 Other long term (current) drug therapy: Secondary | ICD-10-CM

## 2014-06-03 MED ORDER — EMTRICITABINE-TENOFOVIR DF 200-300 MG PO TABS: 1.0000 | ORAL_TABLET | Freq: Every day | ORAL | Status: DC

## 2014-06-03 MED ORDER — DARUNAVIR-COBICISTAT 800-150 MG PO TABS: 1.0000 | ORAL_TABLET | Freq: Every day | ORAL | Status: DC

## 2014-06-03 MED ORDER — LORAZEPAM 2 MG PO TABS: 2.0000 mg | ORAL_TABLET | Freq: Two times a day (BID) | ORAL | Status: DC | PRN

## 2014-06-03 NOTE — Progress Notes (Signed)
Patient ID: Jordan Calderon, male   DOB: [birth year], 53 y.o.   MRN: [id number] HPI: Jordan Calderon is a 53 y.o. male who is here for his HIV f/u.   Allergies: Allergies  Allergen Reactions  . Sulfonamide Derivatives     REACTION: itching    Vitals: Temp: 98.6 F (37 C) (12/15 0957) Temp Source: Oral (12/15 0957) BP: 146/88 mmHg (12/15 0957) Pulse Rate: 83 (12/15 0957)  Past Medical History: Past Medical History  Diagnosis Date  . HIV (human immunodeficiency virus infection)   . Cellulitis 02/11/2014    LEFT ARM  . Depression   . Headache(784.0)     Social History: History   Social History  . Marital Status: Single    Spouse Name: N/A    Number of Children: N/A  . Years of Education: N/A   Social History Main Topics  . Smoking status: Former Smoker -- 0.10 packs/day for 30 years    Types: Cigarettes    Quit date: 01/21/2014  . Smokeless tobacco: Never Used  . Alcohol Use: No  . Drug Use: No  . Sexual Activity: None     Comment: declined condoms   Other Topics Concern  . None   Social History Narrative    Previous Regimen:   Current Regimen: Prezcobix + TRV  Labs: HIV 1 RNA QUANT (copies/mL)  Date Value  05/06/2014 162*  04/03/2014 1109*  02/11/2014 [id number]*   CD4 T CELL ABS (/uL)  Date Value  05/06/2014 210*  04/03/2014 260*  02/11/2014 80*   HEP B S AB (no units)  Date Value  08/14/2006 No   HEPATITIS B SURFACE AG (no units)  Date Value  08/14/2006 No   HCV AB (no units)  Date Value  08/14/2006 No    CrCl: Estimated Creatinine Clearance: 84.4 mL/min (by C-G formula based on Cr of 1).  Lipids:    Component Value Date/Time   CHOL 147 09/17/2013 1126   TRIG 101 09/17/2013 1126   HDL 29* 09/17/2013 1126   CHOLHDL 5.1 09/17/2013 1126   VLDL 20 09/17/2013 1126   LDLCALC 98 09/17/2013 1126    Assessment: Jordan Calderon is doing very well. He is tolerating his ART great. He is doing much better than he has in the past. His last VL has  trended down. He stated that he has not missed a single dose since the last visit here. We're going to check labs in a couple of weeks. If he is truthful about his compliance, he should be undetectable by that point.   Recommendations: Cont  Prezcobix/Truvada Labs in a few weeks  Clide CliffPham, Ovella Manygoats Quang, PharmD Clinical Infectious Disease Pharmacist [facility] 06/03/2014, 2:50 PM

## 2014-06-03 NOTE — Progress Notes (Signed)
Patient ID: Jordan Calderon, male   DOB: [birth year], 53 y.o.   MRN: [id number]          Patient Active Problem List   Diagnosis Date Noted  . Human immunodeficiency virus (HIV) disease 04/06/2006    Priority: High  . Cigarette smoker 07/29/2008    Priority: Medium  . ANXIETY 04/06/2006    Priority: Medium  . SUBSTANCE ABUSE, MULTIPLE 04/06/2006    Priority: Medium  . DEPRESSION 04/06/2006    Priority: Medium  . Cellulitis 02/10/2014  . UPPER RESPIRATORY INFECTION, ACUTE 07/28/2009  . FRACTURE, RIB, LEFT 07/28/2009  . HEPATITIS C 04/06/2006  . CANDIDAL ESOPHAGITIS 04/06/2006  . DENTAL CARIES 04/06/2006  . ANAL FISSURE 04/06/2006    Patient's Medications  New Prescriptions   No medications on file  Previous Medications   PAROXETINE (PAXIL) 40 MG TABLET    Take 1 tablet (40 mg total) by mouth daily.  Modified Medications   Modified Medication Previous Medication   DARUNAVIR-COBICISTAT (PREZCOBIX) 800-150 MG PER TABLET darunavir-cobicistat (PREZCOBIX) 800-150 MG per tablet      Take 1 tablet by mouth daily. Swallow whole. Do NOT crush, break or chew tablets. Take with food.    Take 1 tablet by mouth daily. Swallow whole. Do NOT crush, break or chew tablets. Take with food.   EMTRICITABINE-TENOFOVIR (TRUVADA) 200-300 MG PER TABLET emtricitabine-tenofovir (TRUVADA) 200-300 MG per tablet      Take 1 tablet by mouth daily.    Take 1 tablet by mouth daily.   LORAZEPAM (ATIVAN) 2 MG TABLET LORazepam (ATIVAN) 2 MG tablet      Take 1 tablet (2 mg total) by mouth 2 (two) times daily as needed for anxiety.    Take 1 tablet (2 mg total) by mouth 2 (two) times daily as needed for anxiety.  Discontinued Medications   CLONAZEPAM (KLONOPIN) 0.5 MG TABLET    Take 1 tablet (0.5 mg total) by mouth 2 (two) times daily.    Subjective: Jordan Calderon is in for his routine visit. He denies missing any doses of his medications since his last visit. He states that his relationship with his sister, Jordan Calderon, is  much better. He is not feeling depressed or anxious. He's having no problems tolerating his Truvada or Prezcobix.  Review of Systems: Pertinent items are noted in HPI.  Past Medical History  Diagnosis Date  . HIV (human immunodeficiency virus infection)   . Cellulitis 02/11/2014    LEFT ARM  . Depression   . WUJWJXBJ(478.2Headache(784.0)     History  Substance Use Topics  . Smoking status: Former Smoker -- 0.10 packs/day for 30 years    Types: Cigarettes    Quit date: 01/21/2014  . Smokeless tobacco: Never Used  . Alcohol Use: No    No family history on file.  Allergies  Allergen Reactions  . Sulfonamide Derivatives     REACTION: itching    Objective: Temp: 98.6 F (37 C) (12/15 0957) Temp Source: Oral (12/15 0957) BP: 146/88 mmHg (12/15 0957) Pulse Rate: 83 (12/15 0957) Body mass index is 24.06 kg/(m^2).  General: He is smiling and in good spirits Oral: No oropharyngeal lesions Skin: No rash Lungs: Clear Cor: Regular S1 and S2 with no murmur  Lab Results Lab Results  Component Value Date   WBC 5.5 04/03/2014   HGB 13.8 04/03/2014   HCT 39.4 04/03/2014   MCV 97.3 04/03/2014   PLT 186 04/03/2014    Lab Results  Component Value Date   CREATININE  1.00 04/03/2014   BUN 14 04/03/2014   NA 136 04/03/2014   K 4.4 04/03/2014   CL 102 04/03/2014   CO2 27 04/03/2014    Lab Results  Component Value Date   ALT 13 04/03/2014   AST 18 04/03/2014   ALKPHOS 70 04/03/2014   BILITOT 0.4 04/03/2014    Lab Results  Component Value Date   CHOL 147 09/17/2013   HDL 29* 09/17/2013   LDLCALC 98 09/17/2013   TRIG 101 09/17/2013   CHOLHDL 5.1 09/17/2013    Lab Results HIV 1 RNA QUANT (copies/mL)  Date Value  05/06/2014 162*  04/03/2014 1109*  02/11/2014 [id number]*   CD4 T CELL ABS (/uL)  Date Value  05/06/2014 210*  04/03/2014 260*  02/11/2014 80*     Assessment: Jordan Calderon's adherence has improved dramatically and as a result his infection is coming under good  control.  Plan: 1. Continue Truvada and Prezcobix 2. Continue paroxetine and lorazepam 3. Follow-up after blood work in 6 weeks   Jordan AstersJohn Terilyn Sano, MD [facility] [phone] pager   [phone] cell 06/03/2014, 10:26 AM

## 2014-06-22 ENCOUNTER — Other Ambulatory Visit: Payer: Self-pay | Admitting: Internal Medicine

## 2014-06-26 ENCOUNTER — Other Ambulatory Visit: Payer: Self-pay | Admitting: *Deleted

## 2014-06-26 MED ORDER — PAROXETINE HCL 40 MG PO TABS: 40.0000 mg | ORAL_TABLET | Freq: Every day | ORAL | Status: DC

## 2014-07-21 ENCOUNTER — Other Ambulatory Visit: Payer: Medicare Other

## 2014-07-21 DIAGNOSIS — Z113 Encounter for screening for infections with a predominantly sexual mode of transmission: Secondary | ICD-10-CM

## 2014-07-21 DIAGNOSIS — B20 Human immunodeficiency virus [HIV] disease: Secondary | ICD-10-CM

## 2014-07-21 DIAGNOSIS — Z79899 Other long term (current) drug therapy: Secondary | ICD-10-CM

## 2014-07-21 LAB — CBC
HEMATOCRIT: 40.4 % (ref 39.0–52.0)
HEMOGLOBIN: 14.3 g/dL (ref 13.0–17.0)
MCH: 32.7 pg (ref 26.0–34.0)
MCHC: 35.4 g/dL (ref 30.0–36.0)
MCV: 92.4 fL (ref 78.0–100.0)
MPV: 9.9 fL (ref 8.6–12.4)
PLATELETS: 140 10*3/uL — AB (ref 150–400)
RBC: 4.37 MIL/uL (ref 4.22–5.81)
RDW: 14 % (ref 11.5–15.5)
WBC: 4.3 10*3/uL (ref 4.0–10.5)

## 2014-07-21 LAB — LIPID PANEL
Cholesterol: 164 mg/dL (ref 0–200)
HDL: 34 mg/dL — ABNORMAL LOW (ref >39)
LDL CALC: 104 mg/dL — AB (ref 0–99)
Total CHOL/HDL Ratio: 4.8 Ratio
Triglycerides: 131 mg/dL (ref <150)
VLDL: 26 mg/dL (ref 0–40)

## 2014-07-21 LAB — COMPREHENSIVE METABOLIC PANEL
ALK PHOS: 82 U/L (ref 39–117)
ALT: 16 U/L (ref 0–53)
AST: 25 U/L (ref 0–37)
Albumin: 4 g/dL (ref 3.5–5.2)
BILIRUBIN TOTAL: 0.5 mg/dL (ref 0.2–1.2)
BUN: 15 mg/dL (ref 6–23)
CHLORIDE: 106 meq/L (ref 96–112)
CO2: 24 meq/L (ref 19–32)
Calcium: 9 mg/dL (ref 8.4–10.5)
Creat: 1.12 mg/dL (ref 0.50–1.35)
GLUCOSE: 79 mg/dL (ref 70–99)
Potassium: 5.3 mEq/L (ref 3.5–5.3)
SODIUM: 138 meq/L (ref 135–145)
TOTAL PROTEIN: 7.3 g/dL (ref 6.0–8.3)

## 2014-07-21 LAB — RPR

## 2014-07-22 LAB — HIV-1 RNA QUANT-NO REFLEX-BLD
HIV 1 RNA Quant: 3417 copies/mL — ABNORMAL HIGH (ref <20)
HIV-1 RNA Quant, Log: 3.53 {Log} — ABNORMAL HIGH (ref <1.30)

## 2014-07-22 LAB — T-HELPER CELL (CD4) - (RCID CLINIC ONLY)
CD4 % Helper T Cell: 11 % — ABNORMAL LOW (ref 33–55)
CD4 T CELL ABS: 150 /uL — AB (ref 400–2700)

## 2014-07-27 LAB — HLA B*5701: HLA-B*5701 w/rflx HLA-B High: NEGATIVE

## 2014-08-04 ENCOUNTER — Ambulatory Visit: Payer: Medicare Other | Admitting: Internal Medicine

## 2014-08-04 ENCOUNTER — Other Ambulatory Visit: Payer: Self-pay | Admitting: Internal Medicine

## 2014-08-05 ENCOUNTER — Other Ambulatory Visit: Payer: Self-pay | Admitting: *Deleted

## 2014-08-05 DIAGNOSIS — F411 Generalized anxiety disorder: Secondary | ICD-10-CM

## 2014-08-05 MED ORDER — LORAZEPAM 2 MG PO TABS: 2.0000 mg | ORAL_TABLET | Freq: Two times a day (BID) | ORAL | Status: DC | PRN

## 2014-08-05 NOTE — Telephone Encounter (Signed)
[facility] on [address] did not received the refill called on 06/03/14.  Gave the rx to them verbally today.

## 2014-08-06 ENCOUNTER — Telehealth: Payer: Self-pay

## 2014-08-06 NOTE — Telephone Encounter (Signed)
Per note from Dr Orvan Falconerampbell called the patient and informed him the doctor will look at his face at the next visit and determine and the patient was fine with that.

## 2014-08-06 NOTE — Telephone Encounter (Signed)
Please tell [name] that I will take a look at the areas during his visit on 08/21/2014 and then make a determination about dermatology referral.

## 2014-08-06 NOTE — Telephone Encounter (Signed)
Patient is requesting a dermatology referral for raised,blackish  uneven areas on his face. Patient did not notice but his Sister who is a Engineer, civil (consulting)urse thinks it looks suspicious for cancer.  Please advise.

## 2014-08-21 ENCOUNTER — Ambulatory Visit (INDEPENDENT_AMBULATORY_CARE_PROVIDER_SITE_OTHER): Payer: Medicare Other | Admitting: Internal Medicine

## 2014-08-21 VITALS — BP 143/87 | HR 87 | Temp 98.7°F | Wt 161.5 lb

## 2014-08-21 DIAGNOSIS — F329 Major depressive disorder, single episode, unspecified: Secondary | ICD-10-CM

## 2014-08-21 DIAGNOSIS — F411 Generalized anxiety disorder: Secondary | ICD-10-CM

## 2014-08-21 DIAGNOSIS — F32A Depression, unspecified: Secondary | ICD-10-CM

## 2014-08-21 DIAGNOSIS — B20 Human immunodeficiency virus [HIV] disease: Secondary | ICD-10-CM

## 2014-08-21 NOTE — Progress Notes (Signed)
Patient ID: Jordan Calderon, male   DOB: Apr 16, 1961, 54 y.o.   MRN: [id number]          Patient Active Problem List   Diagnosis Date Noted  . Human immunodeficiency virus (HIV) disease 04/06/2006    Priority: High  . Cigarette smoker 07/29/2008    Priority: Medium  . ANXIETY 04/06/2006    Priority: Medium  . SUBSTANCE ABUSE, MULTIPLE 04/06/2006    Priority: Medium  . DEPRESSION 04/06/2006    Priority: Medium  . Cellulitis 02/10/2014  . UPPER RESPIRATORY INFECTION, ACUTE 07/28/2009  . FRACTURE, RIB, LEFT 07/28/2009  . HEPATITIS C 04/06/2006  . CANDIDAL ESOPHAGITIS 04/06/2006  . DENTAL CARIES 04/06/2006  . ANAL FISSURE 04/06/2006    Patient's Medications  New Prescriptions   No medications on file  Previous Medications   DARUNAVIR-COBICISTAT (PREZCOBIX) 800-150 MG PER TABLET    Take 1 tablet by mouth daily. Swallow whole. Do NOT crush, break or chew tablets. Take with food.   EMTRICITABINE-TENOFOVIR (TRUVADA) 200-300 MG PER TABLET    Take 1 tablet by mouth daily.   LORAZEPAM (ATIVAN) 2 MG TABLET    Take 1 tablet (2 mg total) by mouth 2 (two) times daily as needed for anxiety.   PAROXETINE (PAXIL) 40 MG TABLET    Take 1 tablet (40 mg total) by mouth daily.  Modified Medications   No medications on file  Discontinued Medications   No medications on file  o  Subjective: Jordan Calderon is in for his routine HIV follow-up visit. Initially he was very upbeat and indicated that he was doing very well with his medications. However as the visit went on he became increasingly clear that he is under an extreme amount of stress related to his relationship with his sister, [name]. He tells me that she not has ownership of his house that used to belong to him and his deceased partner, Gabriel Rung. He turned over control of the house to her so that he would not lose the house. He feels he has to controlling about how the house should look and what he should do. He finds this creates a great deal of stress for  him that exacerbates his anxiety and depression. It has gotten so bad that there are times when he feels like he should simply stop taking his medication. This is caused him to miss doses.   Review of Systems: Constitutional: positive for anorexia and fatigue, negative for chills, fevers, sweats and weight loss Eyes: negative Ears, nose, mouth, throat, and face: negative Respiratory: negative Cardiovascular: negative Gastrointestinal: negative Genitourinary:negative  Past Medical History  Diagnosis Date  . HIV (human immunodeficiency virus infection)   . Cellulitis 02/11/2014    LEFT ARM  . Depression   . WUJWJXBJ(478.2)     History  Substance Use Topics  . Smoking status: Former Smoker -- 0.10 packs/day for 30 years    Types: Cigarettes    Quit date: 01/21/2014  . Smokeless tobacco: Never Used  . Alcohol Use: No    No family history on file.  Allergies  Allergen Reactions  . Sulfonamide Derivatives     REACTION: itching    Objective: Temp: 98.7 F (37.1 C) (03/03 1540) Temp Source: Oral (03/03 1540) BP: 143/87 mmHg (03/03 1540) Pulse Rate: 87 (03/03 1540) Body mass index is 23.84 kg/(m^2).  General: He is tearful for much of the exam and extremely anxious  Oral: No oropharyngeal lesions  Skin: Small red area on lower lip and some dry skin  on forehead  Lungs: Clear  Cor: Regular S1 and S2 with no murmurs  Abdomen: Soft and nontender  mood: Depressed and anxious   Lab Results Lab Results  Component Value Date   WBC 4.3 07/21/2014   HGB 14.3 07/21/2014   HCT 40.4 07/21/2014   MCV 92.4 07/21/2014   PLT 140* 07/21/2014    Lab Results  Component Value Date   CREATININE 1.12 07/21/2014   BUN 15 07/21/2014   NA 138 07/21/2014   K 5.3 07/21/2014   CL 106 07/21/2014   CO2 24 07/21/2014    Lab Results  Component Value Date   ALT 16 07/21/2014   AST 25 07/21/2014   ALKPHOS 82 07/21/2014   BILITOT 0.5 07/21/2014    Lab Results  Component Value Date    CHOL 164 07/21/2014   HDL 34* 07/21/2014   LDLCALC 104* 07/21/2014   TRIG 131 07/21/2014   CHOLHDL 4.8 07/21/2014    Lab Results HIV 1 RNA QUANT (copies/mL)  Date Value  07/21/2014 3417*  05/06/2014 162*  04/03/2014 1109*   CD4 T CELL ABS (/uL)  Date Value  07/21/2014 150*  05/06/2014 210*  04/03/2014 260*     Assessment: Jordan Calderon continues to Psychologist, occupationalstruggle mightily with his HIV adherence causing his infection to remain out of control. This is not an issue of knowing why he should take medication, how to take medication, problems getting medication for problems with adverse side effects. His adherence is directly tied to the stress in his life exacerbating his chronic anxiety and depression. I had him speak with our behavioral health consultant, [name], today and will work to reconnect him to counseling as soon as possible.   Plan: Restart behavioral health counseling Follow-up in one month  Cliffton AstersJohn Clemon Devaul, MD [facility] [phone] pager   [phone] cell 08/21/2014, 5:32 PM

## 2014-08-25 ENCOUNTER — Ambulatory Visit: Payer: Medicare Other

## 2014-08-25 ENCOUNTER — Encounter: Payer: Self-pay | Admitting: Internal Medicine

## 2014-08-25 ENCOUNTER — Ambulatory Visit (INDEPENDENT_AMBULATORY_CARE_PROVIDER_SITE_OTHER): Payer: Medicare Other | Admitting: Internal Medicine

## 2014-08-25 VITALS — Temp 98.1°F

## 2014-08-25 DIAGNOSIS — L989 Disorder of the skin and subcutaneous tissue, unspecified: Secondary | ICD-10-CM

## 2014-08-25 DIAGNOSIS — F411 Generalized anxiety disorder: Secondary | ICD-10-CM

## 2014-08-25 DIAGNOSIS — F4310 Post-traumatic stress disorder, unspecified: Secondary | ICD-10-CM

## 2014-08-25 DIAGNOSIS — B182 Chronic viral hepatitis C: Secondary | ICD-10-CM

## 2014-08-25 DIAGNOSIS — F331 Major depressive disorder, recurrent, moderate: Secondary | ICD-10-CM

## 2014-08-25 DIAGNOSIS — K0510 Chronic gingivitis, plaque induced: Secondary | ICD-10-CM

## 2014-08-25 MED ORDER — AMOXICILLIN 500 MG PO CAPS: 500.0000 mg | ORAL_CAPSULE | Freq: Three times a day (TID) | ORAL | Status: DC

## 2014-08-25 NOTE — Progress Notes (Signed)
Patient ID: Jordan Calderon, male   DOB: 1961-01-05, 54 y.o.   MRN: [id number]          Patient Active Problem List   Diagnosis Date Noted  . Human immunodeficiency virus (HIV) disease 04/06/2006    Priority: High  . Chronic hepatitis C without hepatic coma 04/06/2006    Priority: High  . Gingivitis 08/25/2014    Priority: Medium  . Cigarette smoker 07/29/2008    Priority: Medium  . ANXIETY 04/06/2006    Priority: Medium  . SUBSTANCE ABUSE, MULTIPLE 04/06/2006    Priority: Medium  . DEPRESSION 04/06/2006    Priority: Medium  . UPPER RESPIRATORY INFECTION, ACUTE 07/28/2009  . FRACTURE, RIB, LEFT 07/28/2009  . CANDIDAL ESOPHAGITIS 04/06/2006  . DENTAL CARIES 04/06/2006  . ANAL FISSURE 04/06/2006    Patient's Medications  New Prescriptions   AMOXICILLIN (AMOXIL) 500 MG CAPSULE    Take 1 capsule (500 mg total) by mouth 3 (three) times daily.  Previous Medications   DARUNAVIR-COBICISTAT (PREZCOBIX) 800-150 MG PER TABLET    Take 1 tablet by mouth daily. Swallow whole. Do NOT crush, break or chew tablets. Take with food.   EMTRICITABINE-TENOFOVIR (TRUVADA) 200-300 MG PER TABLET    Take 1 tablet by mouth daily.   LORAZEPAM (ATIVAN) 2 MG TABLET    Take 1 tablet (2 mg total) by mouth 2 (two) times daily as needed for anxiety.   PAROXETINE (PAXIL) 40 MG TABLET    Take 1 tablet (40 mg total) by mouth daily.  Modified Medications   No medications on file  Discontinued Medications   No medications on file    Subjective: Jordan Calderon is seen on a work in basis for increasing mouth pain. Started about 2 weeks ago. He was last seen her dental clinic about 4 months ago. He states he now has some loosening of his lower front teeth. He also has increasingly severe pain along his gums. He has been having bleeding when he brushes or flosses recently. He thinks he may have had some low-grade fever.  He states that he has not missed any of his medications since his visit with me last week. He met with  Curley Spice, our mental health counselor, this morning.  Review of Systems: Pertinent items are noted in HPI.  Past Medical History  Diagnosis Date  . HIV (human immunodeficiency virus infection)   . Cellulitis 02/11/2014    LEFT ARM  . Depression   . XYIAXKPV(374.8)     History  Substance Use Topics  . Smoking status: Former Smoker -- 0.10 packs/day for 30 years    Types: Cigarettes    Quit date: 01/21/2014  . Smokeless tobacco: Never Used  . Alcohol Use: No    No family history on file.  Allergies  Allergen Reactions  . Sulfonamide Derivatives     REACTION: itching    Objective: Temp: 98.1 F (36.7 C) (03/07 1057) Temp Source: Oral (03/07 1057) There is no weight on file to calculate BMI.  General: He is uncomfortable due to pain Oral: he has diffuse gingival information with some purulent debris present. Severe halitosis. Lymph nodes: Some enlargement of submandibular lymph nodes with mild tenderness Skin: no rash Lungs: clear Cor: reg S1 and S2   Lab Results Lab Results  Component Value Date   WBC 4.3 07/21/2014   HGB 14.3 07/21/2014   HCT 40.4 07/21/2014   MCV 92.4 07/21/2014   PLT 140* 07/21/2014    Lab Results  Component Value  Date   CREATININE 1.12 07/21/2014   BUN 15 07/21/2014   NA 138 07/21/2014   K 5.3 07/21/2014   CL 106 07/21/2014   CO2 24 07/21/2014    Lab Results  Component Value Date   ALT 16 07/21/2014   AST 25 07/21/2014   ALKPHOS 82 07/21/2014   BILITOT 0.5 07/21/2014    Lab Results  Component Value Date   CHOL 164 07/21/2014   HDL 34* 07/21/2014   LDLCALC 104* 07/21/2014   TRIG 131 07/21/2014   CHOLHDL 4.8 07/21/2014    Lab Results HIV 1 RNA QUANT (copies/mL)  Date Value  07/21/2014 3417*  05/06/2014 162*  04/03/2014 1109*   CD4 T CELL ABS (/uL)  Date Value  07/21/2014 150*  05/06/2014 210*  04/03/2014 260*     Assessment: He has worsening, acute gingivitis. He will reschedule with a dental clinic. I  will treat him with amoxicillin for 7 days and have him continue dental hygiene.  It sounds like his adherence with his HIV medications has improved since last week.  I will restage his chronic hepatitis C.  He has chronic depression and generalized anxiety disorder that may be related to PTSD  Plan: 1. Continue current antiretroviral regimen 2. Continue antidepressant and anxiolytic agents 3. Continue mental health counseling 4. Amoxicillin 500 mg 3 times a day for 7 days 5. Dental clinic referral 6. Follow-up in one month   Michel Bickers, MD [facility] [phone] pager   [phone] cell 08/25/2014, 11:23 AM
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
## 2014-08-27 NOTE — Addendum Note (Signed)
Addended by: Jennet MaduroESTRIDGE, Makyla Bye D on: 08/27/2014 03:53 PM   Modules accepted: Orders

## 2014-09-01 ENCOUNTER — Ambulatory Visit: Payer: Medicare Other

## 2014-09-02 ENCOUNTER — Ambulatory Visit: Payer: Medicare Other

## 2014-09-23 ENCOUNTER — Encounter: Payer: Self-pay | Admitting: Internal Medicine

## 2014-09-23 ENCOUNTER — Ambulatory Visit (INDEPENDENT_AMBULATORY_CARE_PROVIDER_SITE_OTHER): Payer: Medicare Other | Admitting: Internal Medicine

## 2014-09-23 ENCOUNTER — Ambulatory Visit: Payer: Medicare Other

## 2014-09-23 VITALS — BP 134/87 | HR 85 | Temp 98.0°F | Wt 166.8 lb

## 2014-09-23 DIAGNOSIS — B20 Human immunodeficiency virus [HIV] disease: Secondary | ICD-10-CM

## 2014-09-23 DIAGNOSIS — F411 Generalized anxiety disorder: Secondary | ICD-10-CM

## 2014-09-23 DIAGNOSIS — F329 Major depressive disorder, single episode, unspecified: Secondary | ICD-10-CM

## 2014-09-23 DIAGNOSIS — F32A Depression, unspecified: Secondary | ICD-10-CM
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
## 2014-09-25 ENCOUNTER — Telehealth: Payer: Self-pay | Admitting: *Deleted

## 2014-09-25 NOTE — Telephone Encounter (Signed)
[facility] [address] [place]  Patient picked up 30 day supply Lorazepam (2mg  #60 take BID) on 3/16, is requesting early refill today 4/7.  At office visit 4/5, patient stated he was taking 3 at night to sleep.  [facility]. Please advise if this is ok to fill early. Andree CossHowell, Undrea Shipes M, RN

## 2014-09-26 NOTE — Telephone Encounter (Signed)
Please ask [name] what he recommends and let me know.

## 2014-09-29 ENCOUNTER — Other Ambulatory Visit: Payer: Self-pay | Admitting: Dermatology

## 2014-09-29 NOTE — Telephone Encounter (Signed)
Jordan Calderon suggested allowing a one-time early fill, with the understanding that it will not be allowed again.  He suggested then addressing his sleep needs with something other than lorazepam at his next follow up with you.  Jordan Calderon suggested that the patient should keep his counseling appointments to further address his anxiety.

## 2014-09-30 ENCOUNTER — Other Ambulatory Visit: Payer: Self-pay | Admitting: Internal Medicine

## 2014-09-30 ENCOUNTER — Ambulatory Visit: Payer: Medicare Other

## 2014-09-30 DIAGNOSIS — F411 Generalized anxiety disorder: Secondary | ICD-10-CM

## 2014-09-30 DIAGNOSIS — F32A Depression, unspecified: Secondary | ICD-10-CM

## 2014-09-30 DIAGNOSIS — F329 Major depressive disorder, single episode, unspecified: Secondary | ICD-10-CM

## 2014-09-30 MED ORDER — LORAZEPAM 2 MG PO TABS: 2.0000 mg | ORAL_TABLET | Freq: Two times a day (BID) | ORAL | Status: DC | PRN

## 2014-09-30 NOTE — Telephone Encounter (Signed)
Notified pharmacy that the early fill was ok per Dr. Orvan Falconerampbell.

## 2014-09-30 NOTE — Telephone Encounter (Signed)
I agre to a one-time early refill of lorazepam. I will talk to him about his difficulty sleeping at his upcoming visit.

## 2014-09-30 NOTE — BH Specialist Note (Signed)
Jordan Calderon was a bit calmer today and reports that he has been handling his sister better since he has been coming to treatment.  He talked about having to lie to her about things so that she won't get upset, but I pointed out that this can become exhausting.  He agreed.  I told him I would like to think that some time in the near future he will be able to say to her "this is my life and if you don't like it, it's your problem, not mine".  He agreed with this as well, but isn't quite ready to do this.  Plan to meet in one week. Franne FortsKenny Kahealani Yankovich, LCSW  [place]: [phone]

## 2014-10-07 ENCOUNTER — Ambulatory Visit: Payer: Medicare Other

## 2014-10-08 ENCOUNTER — Ambulatory Visit: Payer: Medicare Other

## 2014-10-14 ENCOUNTER — Ambulatory Visit: Payer: Medicare Other

## 2014-11-04 ENCOUNTER — Ambulatory Visit (INDEPENDENT_AMBULATORY_CARE_PROVIDER_SITE_OTHER): Payer: Medicare Other | Admitting: Internal Medicine

## 2014-11-04 ENCOUNTER — Encounter: Payer: Self-pay | Admitting: Internal Medicine

## 2014-11-04 DIAGNOSIS — B182 Chronic viral hepatitis C: Secondary | ICD-10-CM | POA: Diagnosis not present

## 2014-11-04 DIAGNOSIS — B20 Human immunodeficiency virus [HIV] disease: Secondary | ICD-10-CM | POA: Diagnosis present

## 2014-11-04 LAB — COMPREHENSIVE METABOLIC PANEL
ALBUMIN: 3.8 g/dL (ref 3.5–5.2)
ALT: 26 U/L (ref 0–53)
AST: 28 U/L (ref 0–37)
Alkaline Phosphatase: 79 U/L (ref 39–117)
BUN: 21 mg/dL (ref 6–23)
CHLORIDE: 101 meq/L (ref 96–112)
CO2: 24 meq/L (ref 19–32)
Calcium: 9.4 mg/dL (ref 8.4–10.5)
Creat: 1.08 mg/dL (ref 0.50–1.35)
GLUCOSE: 83 mg/dL (ref 70–99)
POTASSIUM: 5.3 meq/L (ref 3.5–5.3)
SODIUM: 136 meq/L (ref 135–145)
Total Bilirubin: 0.4 mg/dL (ref 0.2–1.2)
Total Protein: 7.8 g/dL (ref 6.0–8.3)

## 2014-11-04 LAB — CBC
HEMATOCRIT: 40.3 % (ref 39.0–52.0)
HEMOGLOBIN: 14.4 g/dL (ref 13.0–17.0)
MCH: 31.8 pg (ref 26.0–34.0)
MCHC: 35.7 g/dL (ref 30.0–36.0)
MCV: 89 fL (ref 78.0–100.0)
MPV: 10 fL (ref 8.6–12.4)
Platelets: 126 10*3/uL — ABNORMAL LOW (ref 150–400)
RBC: 4.53 MIL/uL (ref 4.22–5.81)
RDW: 14.3 % (ref 11.5–15.5)
WBC: 3.8 10*3/uL — ABNORMAL LOW (ref 4.0–10.5)
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
## 2014-11-05 LAB — T-HELPER CELL (CD4) - (RCID CLINIC ONLY)
CD4 T CELL HELPER: 6 % — AB (ref 33–55)
CD4 T Cell Abs: 80 /uL — ABNORMAL LOW (ref 400–2700)

## 2014-11-05 LAB — HEPATITIS C RNA QUANTITATIVE: HCV QUANT: NOT DETECTED [IU]/mL (ref <15)

## 2014-11-06 LAB — HIV-1 RNA QUANT-NO REFLEX-BLD
HIV 1 RNA QUANT: 268697 {copies}/mL — AB (ref <20)
HIV-1 RNA Quant, Log: 5.43 {Log} — ABNORMAL HIGH (ref <1.30)

## 2014-11-06 LAB — HEPATITIS C GENOTYPE

## 2014-11-28 ENCOUNTER — Other Ambulatory Visit: Payer: Self-pay | Admitting: Licensed Clinical Social Worker

## 2014-11-28 DIAGNOSIS — F411 Generalized anxiety disorder: Secondary | ICD-10-CM

## 2014-11-28 MED ORDER — LORAZEPAM 2 MG PO TABS: 2.0000 mg | ORAL_TABLET | Freq: Two times a day (BID) | ORAL | Status: DC | PRN

## 2014-12-16 ENCOUNTER — Ambulatory Visit: Payer: Medicare Other

## 2014-12-16 ENCOUNTER — Ambulatory Visit: Payer: Medicare Other | Admitting: Internal Medicine

## 2014-12-16 ENCOUNTER — Telehealth: Payer: Self-pay | Admitting: *Deleted

## 2014-12-16 NOTE — Telephone Encounter (Signed)
Left message requesting pt to call [facility] for a new appt.

## 2015-01-20 ENCOUNTER — Other Ambulatory Visit: Payer: Self-pay | Admitting: *Deleted

## 2015-01-20 DIAGNOSIS — F411 Generalized anxiety disorder: Secondary | ICD-10-CM

## 2015-01-20 MED ORDER — LORAZEPAM 2 MG PO TABS: 2.0000 mg | ORAL_TABLET | Freq: Two times a day (BID) | ORAL | Status: DC | PRN

## 2015-02-17 ENCOUNTER — Ambulatory Visit (INDEPENDENT_AMBULATORY_CARE_PROVIDER_SITE_OTHER): Payer: Medicare Other | Admitting: Internal Medicine

## 2015-02-17 ENCOUNTER — Encounter: Payer: Self-pay | Admitting: Internal Medicine

## 2015-02-17 ENCOUNTER — Ambulatory Visit: Payer: Medicare Other

## 2015-02-17 VITALS — BP 160/101 | HR 75 | Temp 98.1°F | Wt 174.5 lb

## 2015-02-17 DIAGNOSIS — F17210 Nicotine dependence, cigarettes, uncomplicated: Secondary | ICD-10-CM

## 2015-02-17 DIAGNOSIS — F411 Generalized anxiety disorder: Secondary | ICD-10-CM

## 2015-02-17 DIAGNOSIS — Z23 Encounter for immunization: Secondary | ICD-10-CM | POA: Diagnosis not present

## 2015-02-17 DIAGNOSIS — Z720 Tobacco use: Secondary | ICD-10-CM

## 2015-02-17 DIAGNOSIS — B20 Human immunodeficiency virus [HIV] disease: Secondary | ICD-10-CM | POA: Diagnosis not present

## 2015-02-17 LAB — COMPREHENSIVE METABOLIC PANEL
ALT: 19 U/L (ref 9–46)
AST: 21 U/L (ref 10–35)
Albumin: 4.1 g/dL (ref 3.6–5.1)
Alkaline Phosphatase: 67 U/L (ref 40–115)
BUN: 14 mg/dL (ref 7–25)
CHLORIDE: 105 mmol/L (ref 98–110)
CO2: 27 mmol/L (ref 20–31)
CREATININE: 1 mg/dL (ref 0.70–1.33)
Calcium: 9.2 mg/dL (ref 8.6–10.3)
GLUCOSE: 64 mg/dL — AB (ref 65–99)
POTASSIUM: 4.8 mmol/L (ref 3.5–5.3)
SODIUM: 138 mmol/L (ref 135–146)
Total Bilirubin: 0.4 mg/dL (ref 0.2–1.2)
Total Protein: 7.4 g/dL (ref 6.1–8.1)

## 2015-02-17 LAB — CBC
HCT: 41.1 % (ref 39.0–52.0)
Hemoglobin: 14.5 g/dL (ref 13.0–17.0)
MCH: 32.5 pg (ref 26.0–34.0)
MCHC: 35.3 g/dL (ref 30.0–36.0)
MCV: 92.2 fL (ref 78.0–100.0)
MPV: 9 fL (ref 8.6–12.4)
PLATELETS: 159 10*3/uL (ref 150–400)
RBC: 4.46 MIL/uL (ref 4.22–5.81)
RDW: 15.4 % (ref 11.5–15.5)
WBC: 4.9 10*3/uL (ref 4.0–10.5)

## 2015-02-17 NOTE — Assessment & Plan Note (Signed)
I congratulated him on quitting cigarettes and encouraged him to stick with his plan to stay quit.

## 2015-02-17 NOTE — Addendum Note (Signed)
Addended by: Jennet Maduro D on: 02/17/2015 10:04 AM   Modules accepted: Orders

## 2015-02-17 NOTE — Assessment & Plan Note (Signed)
It sounds like his adherence has improved tremendously recently. I will continue his current regimen and repeat his blood work today. I will consider changing Truvada to Descovy at his next visit.

## 2015-02-17 NOTE — Progress Notes (Signed)
Patient ID: Jordan Calderon, male   DOB: 04/08/1961, 54 y.o.   MRN: [id number]          Patient Active Problem List   Diagnosis Date Noted  . Human immunodeficiency virus (HIV) disease 04/06/2006    Priority: High  . Chronic hepatitis C without hepatic coma 04/06/2006    Priority: High  . Gingivitis 08/25/2014    Priority: Medium  . Cigarette smoker 07/29/2008    Priority: Medium  . Anxiety state 04/06/2006    Priority: Medium  . SUBSTANCE ABUSE, MULTIPLE 04/06/2006    Priority: Medium  . DEPRESSION 04/06/2006    Priority: Medium  . UPPER RESPIRATORY INFECTION, ACUTE 07/28/2009  . FRACTURE, RIB, LEFT 07/28/2009  . CANDIDAL ESOPHAGITIS 04/06/2006  . DENTAL CARIES 04/06/2006  . ANAL FISSURE 04/06/2006    Patient's Medications  New Prescriptions   No medications on file  Previous Medications   DARUNAVIR-COBICISTAT (PREZCOBIX) 800-150 MG PER TABLET    Take 1 tablet by mouth daily. Swallow whole. Do NOT crush, break or chew tablets. Take with food.   EMTRICITABINE-TENOFOVIR (TRUVADA) 200-300 MG PER TABLET    Take 1 tablet by mouth daily.   LORAZEPAM (ATIVAN) 2 MG TABLET    Take 1 tablet (2 mg total) by mouth 2 (two) times daily as needed for anxiety.   PAROXETINE (PAXIL) 40 MG TABLET    Take 1 tablet (40 mg total) by mouth daily.  Modified Medications   No medications on file  Discontinued Medications   No medications on file    Subjective: Jordan Calderon is in for his routine follow-up visit. He states that he restarted his HIV medications 2 months ago. He had told me he was taking them in May but he now says that he was not. He said a start date and has been keeping track of his medications with a calendar. He has only missed one dose in the last 2 months and that occurred when his pharmacy was late with his refills. He states that his anxiety and depression are under better control. He is fighting less with his sister, Jordan Calderon. He finds that it does help when he meets with our behavioral  health counselor, Jordan Calderon. He is scheduled to see Jordan Calderon today. He was able to quit smoking cigarettes recently. He is not in a relationship currently and has not been sexually active.  Review of Systems: Constitutional: negative Eyes: negative Ears, nose, mouth, throat, and face: negative Respiratory: negative Cardiovascular: negative Gastrointestinal: negative Genitourinary:negative  Past Medical History  Diagnosis Date  . HIV (human immunodeficiency virus infection)   . Cellulitis 02/11/2014    LEFT ARM  . Depression   . Headache(784.0)   . Anxiety     Social History  Substance Use Topics  . Smoking status: Former Smoker -- 0.10 packs/day for 30 years    Types: Cigarettes    Quit date: 11/19/2014  . Smokeless tobacco: Never Used  . Alcohol Use: No    Family History  Problem Relation Age of Onset  . Adopted: Yes    Allergies  Allergen Reactions  . Sulfonamide Derivatives     REACTION: itching    Objective:  Filed Vitals:   02/17/15 0927  BP: 160/101  Pulse: 75  Temp: 98.1 F (36.7 C)  TempSrc: Oral  Weight: 174 lb 8 oz (79.153 kg)   Body mass index is 25.76 kg/(m^2).  General: He is in very good spirits today. His weight is up 13 pounds since he quit  smoking cigarettes Oral: No oropharyngeal lesions. His gingivitis has resolved Skin: Well tanned without rashes Lungs: Clear Cor: Regular S1 and S2 with no murmurs Abdomen: Soft and nontender He does not appear anxious or depressed  Lab Results Lab Results  Component Value Date   WBC 3.8* 11/04/2014   HGB 14.4 11/04/2014   HCT 40.3 11/04/2014   MCV 89.0 11/04/2014   PLT 126* 11/04/2014    Lab Results  Component Value Date   CREATININE 1.08 11/04/2014   BUN 21 11/04/2014   NA 136 11/04/2014   K 5.3 11/04/2014   CL 101 11/04/2014   CO2 24 11/04/2014    Lab Results  Component Value Date   ALT 26 11/04/2014   AST 28 11/04/2014   ALKPHOS 79 11/04/2014   BILITOT 0.4 11/04/2014    Lab  Results  Component Value Date   CHOL 164 07/21/2014   HDL 34* 07/21/2014   LDLCALC 104* 07/21/2014   TRIG 131 07/21/2014   CHOLHDL 4.8 07/21/2014    Lab Results HIV 1 RNA QUANT (copies/mL)  Date Value  11/04/2014 161096*  07/21/2014 3417*  05/06/2014 162*   CD4 T CELL ABS (/uL)  Date Value  11/04/2014 80*  07/21/2014 150*  05/06/2014 210*     Problem List Items Addressed This Visit      High   Human immunodeficiency virus (HIV) disease    It sounds like his adherence has improved tremendously recently. I will continue his current regimen and repeat his blood work today. I will consider changing Truvada to Descovy at his next visit.      Relevant Orders   T-helper cell (CD4)- ([facility] clinic only)   HIV 1 RNA quant-no reflex-bld   CBC   Comprehensive metabolic panel     Medium   Anxiety state - Primary    His chronic anxiety and depression are under better control and he is continuing behavioral health counseling. He has a long-standing history of cycling in and out of care and on and off medications. I encouraged him to stick with it this time.      Cigarette smoker    I congratulated him on quitting cigarettes and encouraged him to stick with his plan to stay quit.           Jordan Asters, MD [facility] for Infectious Disease [facility] [phone] pager   [phone] cell 02/17/2015, 9:43 AM

## 2015-02-17 NOTE — Assessment & Plan Note (Signed)
His chronic anxiety and depression are under better control and he is continuing behavioral health counseling. He has a long-standing history of cycling in and out of care and on and off medications. I encouraged him to stick with it this time.
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
## 2015-02-18 LAB — T-HELPER CELL (CD4) - (RCID CLINIC ONLY)
CD4 T CELL ABS: 180 /uL — AB (ref 400–2700)
CD4 T CELL HELPER: 9 % — AB (ref 33–55)

## 2015-02-18 LAB — HIV-1 RNA QUANT-NO REFLEX-BLD
HIV 1 RNA Quant: 975 copies/mL — ABNORMAL HIGH (ref <20)
HIV-1 RNA Quant, Log: 2.99 {Log} — ABNORMAL HIGH (ref <1.30)

## 2015-03-03 ENCOUNTER — Ambulatory Visit: Payer: Medicare Other

## 2015-03-03 DIAGNOSIS — F329 Major depressive disorder, single episode, unspecified: Secondary | ICD-10-CM

## 2015-03-03 DIAGNOSIS — F32A Depression, unspecified: Secondary | ICD-10-CM

## 2015-03-03 NOTE — BH Specialist Note (Signed)
Jordan Calderon was in a good mood today and reported that his sister comes later in the month, but he is feeling better about how he is handling her and thinks that she is bothering him less.  I pointed out that it seems she is responding to him setting better boundaries with her.  He mentioned racing thoughts, so I provided some psycho-education on mindfulness and facilitated a guided meditation, which he responded to well.  Plan to meet again in 2 weeks. [name], LCSW

## 2015-03-18 ENCOUNTER — Ambulatory Visit: Payer: Medicare Other

## 2015-03-18 DIAGNOSIS — F32A Depression, unspecified: Secondary | ICD-10-CM

## 2015-03-18 DIAGNOSIS — F329 Major depressive disorder, single episode, unspecified: Secondary | ICD-10-CM

## 2015-03-18 NOTE — BH Specialist Note (Signed)
Makarios talked about his sister again today, but reported that he is learning how to handle her better.  He said that his brother in law talked to him about how he doesn't have to tell her about "problems" with the house, because it just gets her upset and then this causes conflict.  He said it was the first time they have been able to talk one on one (they were going to the store to buy supplies for painting) and it was very helpful.  He also talked about how he takes a lorazepam when she comes to visit and then he runs out at the end of the month, resulting in poor sleep.  I told him about melatonin.  I also provided psycho-education on breathing from the "belly" and he liked this.  Overall, he seems to be doing much better and I told him so. Plan to meet again in 2 weeks. [name], LCSW

## 2015-04-01 ENCOUNTER — Ambulatory Visit: Payer: Medicare Other

## 2015-04-01 DIAGNOSIS — F4310 Post-traumatic stress disorder, unspecified: Secondary | ICD-10-CM
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
## 2015-04-07 ENCOUNTER — Encounter: Payer: Self-pay | Admitting: Internal Medicine

## 2015-04-07 ENCOUNTER — Ambulatory Visit (INDEPENDENT_AMBULATORY_CARE_PROVIDER_SITE_OTHER): Payer: Medicare Other | Admitting: Internal Medicine

## 2015-04-07 VITALS — BP 138/89 | HR 65 | Temp 98.4°F | Ht 69.0 in | Wt 179.8 lb

## 2015-04-07 DIAGNOSIS — F329 Major depressive disorder, single episode, unspecified: Secondary | ICD-10-CM

## 2015-04-07 DIAGNOSIS — B20 Human immunodeficiency virus [HIV] disease: Secondary | ICD-10-CM | POA: Diagnosis not present

## 2015-04-07 DIAGNOSIS — F32A Depression, unspecified: Secondary | ICD-10-CM

## 2015-04-07 MED ORDER — EMTRICITABINE-TENOFOVIR AF 200-25 MG PO TABS: 1.0000 | ORAL_TABLET | Freq: Every day | ORAL | Status: DC

## 2015-04-07 NOTE — Assessment & Plan Note (Signed)
His HIV infection is coming under much better control since restarting antiretroviral therapy several months ago. I will change Truvada to the safer, new version called Descovy. He will continue Prezcobix. I will repeat his lab work today and see him back in 2 months.
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
## 2015-04-07 NOTE — Assessment & Plan Note (Signed)
His depression and anxiety are much better and this is helping his adherence to his HIV medications. I encouraged him to continue seeing our behavioral health counselor. I also introduced him to one of our care managers today so he could get more information about peers support through wire new peer support group and higher ground. I think increased social interaction would be good for him.

## 2015-04-08 LAB — T-HELPER CELL (CD4) - (RCID CLINIC ONLY)
CD4 % Helper T Cell: 11 % — ABNORMAL LOW (ref 33–55)
CD4 T Cell Abs: 230 /uL — ABNORMAL LOW (ref 400–2700)

## 2015-04-09 LAB — HIV-1 RNA QUANT-NO REFLEX-BLD
HIV 1 RNA QUANT: 159 {copies}/mL — AB (ref <20)
HIV-1 RNA QUANT, LOG: 2.2 {Log_copies}/mL — AB (ref <1.30)

## 2015-04-22 ENCOUNTER — Ambulatory Visit: Payer: Medicare Other

## 2015-04-22 DIAGNOSIS — F4310 Post-traumatic stress disorder, unspecified: Secondary | ICD-10-CM
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
## 2015-04-23 ENCOUNTER — Other Ambulatory Visit: Payer: Self-pay | Admitting: *Deleted

## 2015-04-23 DIAGNOSIS — F411 Generalized anxiety disorder: Secondary | ICD-10-CM

## 2015-04-23 MED ORDER — PAROXETINE HCL 40 MG PO TABS: 40.0000 mg | ORAL_TABLET | Freq: Every day | ORAL | Status: DC

## 2015-04-23 MED ORDER — LORAZEPAM 2 MG PO TABS: 2.0000 mg | ORAL_TABLET | Freq: Two times a day (BID) | ORAL | Status: DC | PRN

## 2015-04-29 ENCOUNTER — Ambulatory Visit: Payer: Medicare Other

## 2015-04-29 DIAGNOSIS — F4310 Post-traumatic stress disorder, unspecified: Secondary | ICD-10-CM

## 2015-04-29 NOTE — BH Specialist Note (Signed)
Jordan Calderon was in a good mood today, reporting that he continues to feel good about how he is setting limits with his sister.  I told him that I "bragged" about him yesterday in a meeting with clinic staff, telling them how well he is doing.  He seemed to like this and was surprised that I did that.  He talked about his desire to meet someone to be in a relationship with and I told him about the Peer Support Group and he expressed interest in this and said he would come to the next one this Friday.  We also talked about EMDR and plan to utilize this treatment in the near future.  Plan to meet in 2 weeks. Franne FortsKenny Rubin Dais, LCSW

## 2015-05-13 ENCOUNTER — Ambulatory Visit: Payer: Medicare Other

## 2015-05-13 DIAGNOSIS — F32A Depression, unspecified: Secondary | ICD-10-CM

## 2015-05-13 DIAGNOSIS — F329 Major depressive disorder, single episode, unspecified: Secondary | ICD-10-CM
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
## 2015-05-20 ENCOUNTER — Ambulatory Visit: Payer: Medicare Other

## 2015-05-20 DIAGNOSIS — F4310 Post-traumatic stress disorder, unspecified: Secondary | ICD-10-CM

## 2015-05-20 NOTE — BH Specialist Note (Signed)
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  He talked about some of his goals for the next year, which includes purchasing a car.  I again praised him for the progress he has made.  We also discussed EMDR again and he agrees that it would be good to work on some of his past traumas.  Plan to meet in one week. Franne FortsKenny Belen Pesch, LCSW

## 2015-05-27 ENCOUNTER — Ambulatory Visit: Payer: Medicare Other

## 2015-05-27 DIAGNOSIS — F32A Depression, unspecified: Secondary | ICD-10-CM

## 2015-05-27 DIAGNOSIS — F329 Major depressive disorder, single episode, unspecified: Secondary | ICD-10-CM
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
## 2015-06-03 ENCOUNTER — Ambulatory Visit: Payer: Medicare Other

## 2015-06-03 DIAGNOSIS — F4310 Post-traumatic stress disorder, unspecified: Secondary | ICD-10-CM

## 2015-06-03 NOTE — BH Specialist Note (Signed)
Jordan Calderon was in a good mood today, reporting that his sister's visit was much shorter on Monday, which made him happy.  He also said she doesn't plan to come back until January 17, which is a longer stretch of time than before.  We discussed that he has reached a "plateau" in terms of therapy, so I suggested one of two things - either cut back to every other week, or go deeper with the use of EMDR, to help reprocess trauma.  He said he wants to work on the trauma, so we decided to start that next session.  I reviewed the protocol for EMDR.  Plan to meet again in one week. Jordan FortsKenny Roxy Filler, LCSW

## 2015-06-09 ENCOUNTER — Ambulatory Visit: Payer: Medicare Other

## 2015-06-09 ENCOUNTER — Ambulatory Visit (INDEPENDENT_AMBULATORY_CARE_PROVIDER_SITE_OTHER): Payer: Medicare Other | Admitting: Internal Medicine

## 2015-06-09 ENCOUNTER — Encounter: Payer: Self-pay | Admitting: Internal Medicine

## 2015-06-09 VITALS — BP 122/83 | HR 72 | Temp 98.0°F | Ht 69.0 in | Wt 177.0 lb

## 2015-06-09 DIAGNOSIS — B1920 Unspecified viral hepatitis C without hepatic coma: Secondary | ICD-10-CM | POA: Diagnosis not present

## 2015-06-09 DIAGNOSIS — F329 Major depressive disorder, single episode, unspecified: Secondary | ICD-10-CM | POA: Diagnosis present

## 2015-06-09 DIAGNOSIS — F32A Depression, unspecified: Secondary | ICD-10-CM

## 2015-06-09 DIAGNOSIS — B20 Human immunodeficiency virus [HIV] disease: Secondary | ICD-10-CM

## 2015-06-09 DIAGNOSIS — F4310 Post-traumatic stress disorder, unspecified: Secondary | ICD-10-CM

## 2015-06-09 NOTE — Progress Notes (Signed)
Patient Active Problem List   Diagnosis Date Noted  . Human immunodeficiency virus (HIV) disease (HCC) 04/06/2006    Priority: High  . Gingivitis 08/25/2014    Priority: Medium  . Cigarette smoker 07/29/2008    Priority: Medium  . Anxiety state 04/06/2006    Priority: Medium  . SUBSTANCE ABUSE, MULTIPLE 04/06/2006    Priority: Medium  . Depression 04/06/2006    Priority: Medium  . Hepatitis C 04/06/2006    Priority: Low  . UPPER RESPIRATORY INFECTION, ACUTE 07/28/2009  . FRACTURE, RIB, LEFT 07/28/2009  . CANDIDAL ESOPHAGITIS 04/06/2006  . DENTAL CARIES 04/06/2006  . ANAL FISSURE 04/06/2006    Patient's Medications  New Prescriptions   No medications on file  Previous Medications   DARUNAVIR-COBICISTAT (PREZCOBIX) 800-150 MG PER TABLET    Take 1 tablet by mouth daily. Swallow whole. Do NOT crush, break or chew tablets. Take with food.   EMTRICITABINE-TENOFOVIR AF (DESCOVY) 200-25 MG TABLET    Take 1 tablet by mouth daily.   LORAZEPAM (ATIVAN) 2 MG TABLET    Take 1 tablet (2 mg total) by mouth 2 (two) times daily as needed for anxiety.   PAROXETINE (PAXIL) 40 MG TABLET    Take 1 tablet (40 mg total) by mouth daily.  Modified Medications   No medications on file  Discontinued Medications   No medications on file    Subjective: Jordan Calderon is in for his routine HIV follow-up visit. He states that he is doing very well. He takes his Descovy and Prezcobix first thing each morning with his coffee. He is not having any problems tolerating them and has not missed any doses. He puts a dot on his calendar each morning after he's taken his medication.  He is getting along with his sister, Jordan CoasterGina, much better now. He believes that she listens to him more because she knows he has been seen for behavioral health counselor, Jordan FortsKenny Calderon. He is not feeling depressed. He is feeling much less anxious. He is still not smoking cigarettes.  Review of Systems: Review of Systems    Constitutional: Negative for fever, chills, weight loss, malaise/fatigue and diaphoresis.  HENT: Negative for sore throat.   Respiratory: Negative for cough, sputum production and shortness of breath.   Cardiovascular: Negative for chest pain.  Gastrointestinal: Negative for nausea, vomiting and diarrhea.  Genitourinary: Negative for dysuria and frequency.  Musculoskeletal: Negative for myalgias and joint pain.  Skin: Negative for rash.  Neurological: Negative for focal weakness.  Psychiatric/Behavioral: Negative for depression and substance abuse. The patient is nervous/anxious.     Past Medical History  Diagnosis Date  . HIV (human immunodeficiency virus infection) (HCC)   . Cellulitis 02/11/2014    LEFT ARM  . Depression   . Headache(784.0)   . Anxiety     Social History  Substance Use Topics  . Smoking status: Former Smoker -- 0.10 packs/day for 30 years    Types: Cigarettes    Quit date: 11/19/2014  . Smokeless tobacco: Never Used  . Alcohol Use: No    Family History  Problem Relation Age of Onset  . Adopted: Yes    Allergies  Allergen Reactions  . Sulfonamide Derivatives     REACTION: itching    Objective:  Filed Vitals:   06/09/15 0907  BP: 122/83  Pulse: 72  Temp: 98 F (36.7 C)  Height: 5\' 9"  (1.753 m)  Weight: 177 lb (80.287 kg)   Body  mass index is 26.13 kg/(m^2).  Physical Exam  Constitutional: He is oriented to person, place, and time.  He is smiling and in very good spirits.  HENT:  Mouth/Throat: No oropharyngeal exudate.  His gingivitis has resolved.  Eyes: Conjunctivae are normal.  Cardiovascular: Normal rate and regular rhythm.   No murmur heard. Pulmonary/Chest: Breath sounds normal.  Abdominal: Soft. He exhibits no mass. There is no tenderness.  Musculoskeletal: Normal range of motion.  Neurological: He is alert and oriented to person, place, and time.  Skin: No rash noted.  Psychiatric: Mood and affect normal.    Lab  Results Lab Results  Component Value Date   WBC 4.9 02/17/2015   HGB 14.5 02/17/2015   HCT 41.1 02/17/2015   MCV 92.2 02/17/2015   PLT 159 02/17/2015    Lab Results  Component Value Date   CREATININE 1.00 02/17/2015   BUN 14 02/17/2015   NA 138 02/17/2015   K 4.8 02/17/2015   CL 105 02/17/2015   CO2 27 02/17/2015    Lab Results  Component Value Date   ALT 19 02/17/2015   AST 21 02/17/2015   ALKPHOS 67 02/17/2015   BILITOT 0.4 02/17/2015    Lab Results  Component Value Date   CHOL 164 07/21/2014   HDL 34* 07/21/2014   LDLCALC 104* 07/21/2014   TRIG 131 07/21/2014   CHOLHDL 4.8 07/21/2014    Lab Results HIV 1 RNA QUANT (copies/mL)  Date Value  04/07/2015 159*  02/17/2015 975*  11/04/2014 161096*   CD4 T CELL ABS (/uL)  Date Value  04/07/2015 230*  02/17/2015 180*  11/04/2014 80*      Problem List Items Addressed This Visit      High   Human immunodeficiency virus (HIV) disease (HCC)    His adherence is much improved and as a result his infection is coming under much better control. I will continue his current regimen and repeat lab work today. He will follow-up in 3 months.      Relevant Orders   T-helper cell (CD4)- ([facility] clinic only)   HIV 1 RNA quant-no reflex-bld     Medium   Depression - Primary    His depression and anxiety are under much better control. I will continue paroxitene. I am hopeful that he may be able to wean off of lorazepam within the next year. I encouraged him to continue regular counseling visits. I talked to him about focusing on sustaining the progress he has made over the next year.        Low   Hepatitis C    His hepatitis C viral load was undetectable earlier this year documenting spontaneous clearing of his infection.           [name], MD [facility] for Infectious Disease [facility] [phone] pager   [phone] cell 06/09/2015, 9:26 AM

## 2015-06-09 NOTE — Assessment & Plan Note (Signed)
His depression and anxiety are under much better control. I will continue paroxitene. I am hopeful that he may be able to wean off of lorazepam within the next year. I encouraged him to continue regular counseling visits. I talked to him about focusing on sustaining the progress he has made over the next year.

## 2015-06-09 NOTE — BH Specialist Note (Signed)
Jordan Calderon participated in EMDR today and worked on a childhood trauma for the entire session.  He said it was very therapeutic, saying he had never been able to think about the experience quite the way he did today.  He was able to calm back down afterward and said it felt good to do this today.  Plan to meet next week. Franne FortsKenny Aamina Skiff, LCSW

## 2015-06-09 NOTE — Assessment & Plan Note (Signed)
His hepatitis C viral load was undetectable earlier this year documenting spontaneous clearing of his infection.

## 2015-06-09 NOTE — Assessment & Plan Note (Signed)
His adherence is much improved and as a result his infection is coming under much better control. I will continue his current regimen and repeat lab work today. He will follow-up in 3 months.

## 2015-06-10 LAB — T-HELPER CELL (CD4) - (RCID CLINIC ONLY)
CD4 % Helper T Cell: 15 % — ABNORMAL LOW (ref 33–55)
CD4 T Cell Abs: 220 /uL — ABNORMAL LOW (ref 400–2700)

## 2015-06-11 LAB — HIV-1 RNA QUANT-NO REFLEX-BLD
HIV 1 RNA QUANT: 50 {copies}/mL — AB (ref <20)
HIV-1 RNA QUANT, LOG: 1.7 {Log_copies}/mL — AB (ref <1.30)

## 2015-06-17 ENCOUNTER — Ambulatory Visit: Payer: Medicare Other

## 2015-06-18 ENCOUNTER — Other Ambulatory Visit: Payer: Self-pay | Admitting: Internal Medicine

## 2015-06-23 ENCOUNTER — Ambulatory Visit: Payer: Medicare Other

## 2015-07-21 ENCOUNTER — Other Ambulatory Visit: Payer: Self-pay | Admitting: Internal Medicine

## 2015-07-21 DIAGNOSIS — F411 Generalized anxiety disorder: Secondary | ICD-10-CM

## 2015-07-23 ENCOUNTER — Telehealth: Payer: Self-pay | Admitting: *Deleted

## 2015-07-23 NOTE — Telephone Encounter (Signed)
Patient requesting refill of lorazepam.  Per Dr. Blair Dolphin notes, patient was instructed to continue counseling with [name].  Patient no-showed last 2 counseling appointments.  RN left message for patient asking him to call and reengage in counseling. Please advise if you would like to refill the lorazepam. Andree Coss, RN

## 2015-07-28 NOTE — Telephone Encounter (Signed)
He may have 2 refills but please let him know that any future refills will be contingent on staying in therapy.

## 2015-07-29 NOTE — Telephone Encounter (Signed)
Left message asking the patient to continue with therapy for future refills.

## 2015-09-08 ENCOUNTER — Telehealth: Payer: Self-pay | Admitting: *Deleted

## 2015-09-08 ENCOUNTER — Ambulatory Visit (INDEPENDENT_AMBULATORY_CARE_PROVIDER_SITE_OTHER): Payer: Medicare Other | Admitting: Internal Medicine

## 2015-09-08 ENCOUNTER — Encounter: Payer: Self-pay | Admitting: Internal Medicine

## 2015-09-08 VITALS — BP 144/89 | HR 84 | Temp 98.5°F | Wt 178.5 lb

## 2015-09-08 DIAGNOSIS — Z79899 Other long term (current) drug therapy: Secondary | ICD-10-CM | POA: Diagnosis present

## 2015-09-08 DIAGNOSIS — B20 Human immunodeficiency virus [HIV] disease: Secondary | ICD-10-CM

## 2015-09-08 DIAGNOSIS — F411 Generalized anxiety disorder: Secondary | ICD-10-CM

## 2015-09-08 DIAGNOSIS — Z87891 Personal history of nicotine dependence: Secondary | ICD-10-CM | POA: Diagnosis not present

## 2015-09-08 DIAGNOSIS — K0510 Chronic gingivitis, plaque induced: Secondary | ICD-10-CM | POA: Diagnosis not present

## 2015-09-08 LAB — LIPID PANEL
CHOLESTEROL: 193 mg/dL (ref 125–200)
HDL: 31 mg/dL — AB (ref >=40)
LDL Cholesterol: 131 mg/dL — ABNORMAL HIGH (ref <130)
Total CHOL/HDL Ratio: 6.2 Ratio — ABNORMAL HIGH (ref <=5.0)
Triglycerides: 154 mg/dL — ABNORMAL HIGH (ref <150)
VLDL: 31 mg/dL — ABNORMAL HIGH (ref <30)

## 2015-09-08 LAB — CBC
HCT: 44.3 % (ref 39.0–52.0)
HEMOGLOBIN: 15.3 g/dL (ref 13.0–17.0)
MCH: 32.8 pg (ref 26.0–34.0)
MCHC: 34.5 g/dL (ref 30.0–36.0)
MCV: 94.9 fL (ref 78.0–100.0)
MPV: 9.2 fL (ref 8.6–12.4)
PLATELETS: 174 10*3/uL (ref 150–400)
RBC: 4.67 MIL/uL (ref 4.22–5.81)
RDW: 14 % (ref 11.5–15.5)
WBC: 5.9 10*3/uL (ref 4.0–10.5)

## 2015-09-08 LAB — COMPREHENSIVE METABOLIC PANEL
ALBUMIN: 4.1 g/dL (ref 3.6–5.1)
ALT: 16 U/L (ref 9–46)
AST: 15 U/L (ref 10–35)
Alkaline Phosphatase: 84 U/L (ref 40–115)
BUN: 18 mg/dL (ref 7–25)
CHLORIDE: 105 mmol/L (ref 98–110)
CO2: 25 mmol/L (ref 20–31)
Calcium: 9 mg/dL (ref 8.6–10.3)
Creat: 1.12 mg/dL (ref 0.70–1.33)
Glucose, Bld: 86 mg/dL (ref 65–99)
POTASSIUM: 4.9 mmol/L (ref 3.5–5.3)
Sodium: 138 mmol/L (ref 135–146)
TOTAL PROTEIN: 7.4 g/dL (ref 6.1–8.1)
Total Bilirubin: 0.4 mg/dL (ref 0.2–1.2)

## 2015-09-08 NOTE — Assessment & Plan Note (Signed)
His gingivitis has resolved. He is maintaining routine mouth care.

## 2015-09-08 NOTE — Assessment & Plan Note (Signed)
His HIV infection has come under much better control in the past year since restarting therapy and is having slow CD4 reconstitution. He will continue Descovy and Prezcobix. I will repeat lab work today and see him back in 6 weeks.

## 2015-09-08 NOTE — Telephone Encounter (Signed)
Pharmacy called to ask if the patient got the okay from the doctor to have his lorazopam filled early, by 10 days. Advised after speaking with the provider he was here to be seen today and they did not discuss that at all. Dr Orvan Falconerampbell says no to having it early and [facility] to get any further refills and he has not seen him in 3 months.

## 2015-09-08 NOTE — Progress Notes (Signed)
Patient Active Problem List   Diagnosis Date Noted  . Human immunodeficiency virus (HIV) disease (HCC) 04/06/2006    Priority: High  . Gingivitis 08/25/2014    Priority: Medium  . Former cigarette smoker 07/29/2008    Priority: Medium  . Anxiety state 04/06/2006    Priority: Medium  . SUBSTANCE ABUSE, MULTIPLE 04/06/2006    Priority: Medium  . Depression 04/06/2006    Priority: Medium  . Hepatitis C 04/06/2006    Priority: Low  . UPPER RESPIRATORY INFECTION, ACUTE 07/28/2009  . FRACTURE, RIB, LEFT 07/28/2009  . CANDIDAL ESOPHAGITIS 04/06/2006  . DENTAL CARIES 04/06/2006  . ANAL FISSURE 04/06/2006    Patient's Medications  New Prescriptions   No medications on file  Previous Medications   EMTRICITABINE-TENOFOVIR AF (DESCOVY) 200-25 MG TABLET    Take 1 tablet by mouth daily.   LORAZEPAM (ATIVAN) 2 MG TABLET    TAKE 1 TABLET BY MOUTH TWICE DAILY AS NEEDED FOR ANXIETY   PAROXETINE (PAXIL) 40 MG TABLET    Take 1 tablet (40 mg total) by mouth daily.   PREZCOBIX 800-150 MG TABLET    TAKE 1 TABLET BY MOUTH EVERY DAY WITH FOOD. SWALLOW WHOLE. DO NOT CRUSH, BREAK, OR CHEW TABLET.  Modified Medications   No medications on file  Discontinued Medications   No medications on file    Subjective: [name] is in for his routine HIV follow-up visit he has all of his medications with him and also his medication calendar. He continues to take Descovy and Prezcobix each morning. He has only missed one dose since his last visit. This occurred on a Saturday morning when he was hurrying off to work. After that Ms. he now keeps a small supply in a bag that he takes to work case it happens again. His chronic anxiety and depression are under better control. He has found a way to manage his relationship with his sister, [name]. He is now working at the [employer] on weekends. He is currently not in a relationship and has not been sexually active. He states that [name] has encouraged him to not  perform new relationships.  Review of Systems: Review of Systems  Constitutional: Negative for fever, chills, weight loss, malaise/fatigue and diaphoresis.  HENT: Negative for sore throat.   Respiratory: Negative for cough, sputum production and shortness of breath.   Cardiovascular: Negative for chest pain.  Gastrointestinal: Negative for nausea, vomiting and diarrhea.  Skin: Negative for rash.  Neurological: Negative for headaches.  Psychiatric/Behavioral: Positive for depression. Negative for substance abuse. The patient is nervous/anxious.     Past Medical History  Diagnosis Date  . HIV (human immunodeficiency virus infection) (HCC)   . Cellulitis 02/11/2014    LEFT ARM  . Depression   . Headache(784.0)   . Anxiety     Social History  Substance Use Topics  . Smoking status: Former Smoker -- 0.10 packs/day for 30 years    Types: Cigarettes    Quit date: 11/19/2014  . Smokeless tobacco: Never Used  . Alcohol Use: No    Family History  Problem Relation Age of Onset  . Adopted: Yes    Allergies  Allergen Reactions  . Sulfonamide Derivatives     REACTION: itching    Objective:  Filed Vitals:   09/08/15 1005  BP: 144/89  Pulse: 84  Temp: 98.5 F (36.9 C)  TempSrc: Oral  Weight: 178 lb 8 oz (80.967 kg)  Body mass index is 26.35 kg/(m^2).  Physical Exam  Constitutional: He is oriented to person, place, and time.  He is smiling and in good spirits.  HENT:  Mouth/Throat: No oropharyngeal exudate.  His teeth and gums are in much better shape than last year when he suffered gingivitis.  Eyes: Conjunctivae are normal.  Cardiovascular: Normal rate and regular rhythm.   No murmur heard. Pulmonary/Chest: Breath sounds normal.  Neurological: He is alert and oriented to person, place, and time.  Skin: No rash noted.  Psychiatric: Mood and affect normal.    Lab Results Lab Results  Component Value Date   WBC 4.9 02/17/2015   HGB 14.5 02/17/2015   HCT 41.1  02/17/2015   MCV 92.2 02/17/2015   PLT 159 02/17/2015    Lab Results  Component Value Date   CREATININE 1.00 02/17/2015   BUN 14 02/17/2015   NA 138 02/17/2015   K 4.8 02/17/2015   CL 105 02/17/2015   CO2 27 02/17/2015    Lab Results  Component Value Date   ALT 19 02/17/2015   AST 21 02/17/2015   ALKPHOS 67 02/17/2015   BILITOT 0.4 02/17/2015    Lab Results  Component Value Date   CHOL 164 07/21/2014   HDL 34* 07/21/2014   LDLCALC 104* 07/21/2014   TRIG 131 07/21/2014   CHOLHDL 4.8 07/21/2014    Lab Results HIV 1 RNA QUANT (copies/mL)  Date Value  06/09/2015 50*  04/07/2015 159*  02/17/2015 975*   CD4 T CELL ABS (/uL)  Date Value  06/09/2015 220*  04/07/2015 230*  02/17/2015 180*      Problem List Items Addressed This Visit      High   Human immunodeficiency virus (HIV) disease (HCC)    His HIV infection has come under much better control in the past year since restarting therapy and is having slow CD4 reconstitution. He will continue Descovy and Prezcobix. I will repeat lab work today and see him back in 6 weeks.      Relevant Orders   T-helper cell (CD4)- ([facility] clinic only)   HIV 1 RNA quant-no reflex-bld   CBC   Comprehensive metabolic panel   RPR   Lipid panel     Medium   Anxiety state    His chronic anxiety and depression are under better control. I talked to him about how he is cycled in and out of care over the past few decades. I encouraged him to stay in counseling. He will make a follow-up appointment with our counselor, [name].       Former cigarette smoker    He successfully quit smoking last year.      Gingivitis    His gingivitis has resolved. He is maintaining routine mouth care.       Other Visit Diagnoses    Encounter for long-term (current) use of medications    -  Primary    Relevant Orders    Lipid panel         [name], MD [facility] for Infectious Disease [facility] [phone] pager    [phone] cell 09/08/2015, 10:42 AM

## 2015-09-08 NOTE — Assessment & Plan Note (Signed)
His chronic anxiety and depression are under better control. I talked to him about how he is cycled in and out of care over the past few decades. I encouraged him to stay in counseling. He will make a follow-up appointment with our counselor, [name].

## 2015-09-08 NOTE — Assessment & Plan Note (Signed)
He successfully quit smoking last year.

## 2015-09-09 LAB — T-HELPER CELL (CD4) - (RCID CLINIC ONLY)
CD4 % Helper T Cell: 15 % — ABNORMAL LOW (ref 33–55)
CD4 T Cell Abs: 210 /uL — ABNORMAL LOW (ref 400–2700)

## 2015-09-09 LAB — RPR

## 2015-09-10 LAB — HIV-1 RNA QUANT-NO REFLEX-BLD
HIV 1 RNA QUANT: 51 {copies}/mL — AB (ref <20)
HIV-1 RNA QUANT, LOG: 1.71 {Log_copies}/mL — AB (ref <1.30)

## 2015-09-14 ENCOUNTER — Ambulatory Visit: Payer: Medicare Other

## 2015-10-13 ENCOUNTER — Other Ambulatory Visit: Payer: Self-pay | Admitting: Internal Medicine

## 2015-10-13 ENCOUNTER — Telehealth: Payer: Self-pay | Admitting: *Deleted

## 2015-10-13 NOTE — Telephone Encounter (Signed)
He may have one refill with the understanding that he cannot miss his counseling appointments.

## 2015-10-13 NOTE — Telephone Encounter (Signed)
I would give him refill until Jonny RuizJohn can come back and he has a chance to see Bernette RedbirdKenny again. Idont want him to have a withdrawal seizure

## 2015-10-13 NOTE — Telephone Encounter (Signed)
Patient calling for refill of lorazepam.  Per last note, patient was supposed to be seen by Bernette RedbirdKenny before refills would be called in. Patient no-showed the counseling appointment 3/27. Patient stated he was waiting for [employer] to come install a new door "some time in the next 2 weeks" and he couldn't schedule an appointment with Bernette RedbirdKenny at this time. Patient then asked if Dr. Orvan Falconerampbell would consider authorizing the refill if he has a counseling appointment scheduled.  [facility] at his request.  Please advise on refills at this time. Andree CossHowell, Michelle M, RN

## 2015-10-14 NOTE — Telephone Encounter (Signed)
No problem thank YOU!

## 2015-10-14 NOTE — Telephone Encounter (Signed)
1 refill sent. Thanks!

## 2015-10-19 ENCOUNTER — Ambulatory Visit: Payer: Medicare Other

## 2015-10-20 ENCOUNTER — Ambulatory Visit: Payer: Medicare Other

## 2015-10-29 ENCOUNTER — Ambulatory Visit: Payer: Medicare Other | Admitting: *Deleted

## 2015-10-29 ENCOUNTER — Encounter: Payer: Self-pay | Admitting: Internal Medicine

## 2015-10-29 ENCOUNTER — Ambulatory Visit (INDEPENDENT_AMBULATORY_CARE_PROVIDER_SITE_OTHER): Payer: Medicare Other | Admitting: Internal Medicine

## 2015-10-29 VITALS — BP 142/94 | HR 85 | Temp 97.4°F | Wt 180.0 lb

## 2015-10-29 DIAGNOSIS — F4310 Post-traumatic stress disorder, unspecified: Secondary | ICD-10-CM

## 2015-10-29 DIAGNOSIS — B20 Human immunodeficiency virus [HIV] disease: Secondary | ICD-10-CM | POA: Diagnosis not present

## 2015-10-29 DIAGNOSIS — F411 Generalized anxiety disorder: Secondary | ICD-10-CM

## 2015-10-29 NOTE — BH Specialist Note (Signed)
Counselor met with Jordan Calderon in exam room today per Dr. Megan Salon request to check on patient.  Counselor was communicated to by patient that he is scheduled to meet with other counselor in a week.  Counselor provided support and encouragement to keep appointment with other counselor.  Patient agreed he would otherwise patient was in pretty good spirits.   [name], MA, LPC [facility]

## 2015-10-29 NOTE — Assessment & Plan Note (Signed)
His infection has come under much better control and he is having good CD4 reconstitution since restarting therapy last year. He will continue his current antiretroviral regimen and follow-up after lab work in 6 months.

## 2015-10-29 NOTE — Progress Notes (Signed)
Patient Active Problem List   Diagnosis Date Noted  . Human immunodeficiency virus (HIV) disease (HCC) 04/06/2006    Priority: High  . Gingivitis 08/25/2014    Priority: Medium  . Former cigarette smoker 07/29/2008    Priority: Medium  . Anxiety state 04/06/2006    Priority: Medium  . SUBSTANCE ABUSE, MULTIPLE 04/06/2006    Priority: Medium  . Depression 04/06/2006    Priority: Medium  . Hepatitis C 04/06/2006    Priority: Low  . UPPER RESPIRATORY INFECTION, ACUTE 07/28/2009  . FRACTURE, RIB, LEFT 07/28/2009  . CANDIDAL ESOPHAGITIS 04/06/2006  . DENTAL CARIES 04/06/2006  . ANAL FISSURE 04/06/2006    Patient's Medications  New Prescriptions   No medications on file  Previous Medications   EMTRICITABINE-TENOFOVIR AF (DESCOVY) 200-25 MG TABLET    Take 1 tablet by mouth daily.   LORAZEPAM (ATIVAN) 2 MG TABLET    TAKE 1 TABLET BY MOUTH TWICE DAILY AS NEEDED FOR ANXIETY   PAROXETINE (PAXIL) 40 MG TABLET    Take 1 tablet (40 mg total) by mouth daily.   PREZCOBIX 800-150 MG TABLET    TAKE 1 TABLET BY MOUTH EVERY DAY WITH FOOD. SWALLOW WHOLE. DO NOT CRUSH, BREAK, OR CHEW TABLET.  Modified Medications   No medications on file  Discontinued Medications   No medications on file    Subjective: Jordan Calderon is in for his routine HIV follow-up visit. He has not missed any doses of his Descovy or Prezcobix since his last visit. He continues to take his paroxitene and lorazepam. He has not been as anxious recently and has been able to cope with his sister, Jordan Calderon, much more easily.   Review of Systems: Review of Systems  Constitutional: Negative for fever, chills, weight loss, malaise/fatigue and diaphoresis.  HENT: Negative for sore throat.   Respiratory: Negative for cough, sputum production and shortness of breath.   Cardiovascular: Negative for chest pain.  Gastrointestinal: Negative for nausea, vomiting and diarrhea.  Genitourinary: Negative for dysuria and frequency.    Musculoskeletal: Negative for myalgias and joint pain.  Skin: Negative for rash.  Neurological: Negative for dizziness and headaches.  Psychiatric/Behavioral: Negative for depression and substance abuse. The patient is nervous/anxious and has insomnia.     Past Medical History  Diagnosis Date  . HIV (human immunodeficiency virus infection) (HCC)   . Cellulitis 02/11/2014    LEFT ARM  . Depression   . Headache(784.0)   . Anxiety     Social History  Substance Use Topics  . Smoking status: Former Smoker -- 0.10 packs/day for 30 years    Types: Cigarettes    Quit date: 11/19/2014  . Smokeless tobacco: Never Used  . Alcohol Use: No    Family History  Problem Relation Age of Onset  . Adopted: Yes    Allergies  Allergen Reactions  . Sulfonamide Derivatives     REACTION: itching    Objective:  Filed Vitals:   10/29/15 0852  BP: 142/94  Pulse: 85  Temp: 97.4 F (36.3 C)  TempSrc: Oral  Weight: 180 lb (81.647 kg)   Body mass index is 26.57 kg/(m^2).  Physical Exam  Constitutional: He is oriented to person, place, and time.  He is smiling and in good spirits.  HENT:  Mouth/Throat: No oropharyngeal exudate.  Eyes: Conjunctivae are normal.  Cardiovascular: Normal rate and regular rhythm.   No murmur heard. Pulmonary/Chest: Breath sounds normal.  Abdominal: Soft. He exhibits no  mass. There is no tenderness.  Musculoskeletal: Normal range of motion.  Neurological: He is alert and oriented to person, place, and time.  Skin: No rash noted.  Psychiatric: Mood and affect normal.    Lab Results Lab Results  Component Value Date   WBC 5.9 09/08/2015   HGB 15.3 09/08/2015   HCT 44.3 09/08/2015   MCV 94.9 09/08/2015   PLT 174 09/08/2015    Lab Results  Component Value Date   CREATININE 1.12 09/08/2015   BUN 18 09/08/2015   NA 138 09/08/2015   K 4.9 09/08/2015   CL 105 09/08/2015   CO2 25 09/08/2015    Lab Results  Component Value Date   ALT 16 09/08/2015    AST 15 09/08/2015   ALKPHOS 84 09/08/2015   BILITOT 0.4 09/08/2015    Lab Results  Component Value Date   CHOL 193 09/08/2015   HDL 31* 09/08/2015   LDLCALC 131* 09/08/2015   TRIG 154* 09/08/2015   CHOLHDL 6.2* 09/08/2015    Lab Results HIV 1 RNA QUANT (copies/mL)  Date Value  09/08/2015 51*  06/09/2015 50*  04/07/2015 159*   CD4 T CELL ABS (/uL)  Date Value  09/08/2015 210*  06/09/2015 220*  04/07/2015 230*      Problem List Items Addressed This Visit      High   Human immunodeficiency virus (HIV) disease (HCC)    His infection has come under much better control and he is having good CD4 reconstitution since restarting therapy last year. He will continue his current antiretroviral regimen and follow-up after lab work in 6 months.      Relevant Orders   T-helper cell (CD4)- ([facility] clinic only)   HIV 1 RNA quant-no reflex-bld   CBC   Comprehensive metabolic panel     Medium   Anxiety state - Primary    He has chronic, generalized anxiety disorder. I think that he could definitely benefit from counseling and I have asked him to keep regular visits with our counselor, [name].           [name], MD [facility] for Infectious Disease [facility] [phone] pager   [phone] cell 10/29/2015, 10:09 AM

## 2015-10-29 NOTE — Assessment & Plan Note (Signed)
He has chronic, generalized anxiety disorder. I think that he could definitely benefit from counseling and I have asked him to keep regular visits with our counselor, [name].

## 2015-11-02 ENCOUNTER — Ambulatory Visit: Payer: Medicare Other

## 2015-11-02 ENCOUNTER — Encounter (INDEPENDENT_AMBULATORY_CARE_PROVIDER_SITE_OTHER): Payer: Self-pay | Admitting: *Deleted

## 2015-11-02 VITALS — BP 117/76 | HR 62 | Temp 97.9°F | Resp 16 | Wt 180.5 lb

## 2015-11-02 DIAGNOSIS — E785 Hyperlipidemia, unspecified: Secondary | ICD-10-CM

## 2015-11-02 DIAGNOSIS — F4310 Post-traumatic stress disorder, unspecified: Secondary | ICD-10-CM

## 2015-11-02 DIAGNOSIS — Z006 Encounter for examination for normal comparison and control in clinical research program: Secondary | ICD-10-CM

## 2015-11-02 LAB — LIPID PANEL
CHOLESTEROL: 207 mg/dL — AB (ref 125–200)
HDL: 29 mg/dL — ABNORMAL LOW (ref >=40)
LDL Cholesterol: 136 mg/dL — ABNORMAL HIGH (ref <130)
TRIGLYCERIDES: 212 mg/dL — AB (ref <150)
Total CHOL/HDL Ratio: 7.1 Ratio — ABNORMAL HIGH (ref <=5.0)
VLDL: 42 mg/dL — AB (ref <30)

## 2015-11-02 NOTE — Progress Notes (Signed)
Colleen here for screening visit for [id number]: A Randomized Trial to Prevent Vascular Events in HIV (The REPRIEVE Study). After reviewing the consent, study responsibilities and answering questions, the signed informed consent was obtained. Fasting lipid panel was obtained. Will schedule for entry visit once entry eligibility criteria confirmed.

## 2015-11-02 NOTE — BH Specialist Note (Signed)
Jordan Calderon reported that things are going some better and that his sister is only visiting once a month now.  He said she is also starting to confide in him about her struggles with her oldest son, who is in college.  He said he has stopped working at [employer], as he does every year during the spring and summer.  He said they get busier in the fall and winter and he will go back then.  Overall, he seems to be doing well.  Plan to meet again in 2 weeks. Franne FortsKenny Mikal Blasdell, LCSW

## 2015-11-12 ENCOUNTER — Ambulatory Visit: Payer: Medicare Other

## 2015-11-12 ENCOUNTER — Other Ambulatory Visit: Payer: Self-pay | Admitting: Internal Medicine

## 2015-11-12 DIAGNOSIS — F411 Generalized anxiety disorder: Secondary | ICD-10-CM

## 2015-11-12 DIAGNOSIS — F4310 Post-traumatic stress disorder, unspecified: Secondary | ICD-10-CM

## 2015-11-12 NOTE — BH Specialist Note (Signed)
Jordan Calderon was in a good mood today, reporting that he had a surprisingly good visit with his sister and brother in law on this past Monday. These visits have been such stressors for him over the past few years that he is relieved that they are better lately. He talked about taking Paxil and how it keeps him from crying, even when he wants to cry.  I discussed this aspect of antidepressants with him and agreed to talk to Dr. Orvan Falconerampbell about reducing the dose to 30 mgs.  He also said he has noticed a decrease in libido and I said it is possible that the Paxil is causing this as well, but I couldn't really say.  He also said he is going to the support group tomorrow and I praised him for this.  Plan to meet again in 2 weeks. Franne FortsKenny Jamieson Hetland, LCSW

## 2015-11-19 ENCOUNTER — Encounter (INDEPENDENT_AMBULATORY_CARE_PROVIDER_SITE_OTHER): Payer: Self-pay | Admitting: *Deleted

## 2015-11-19 VITALS — BP 134/87 | HR 75 | Temp 97.9°F | Resp 16 | Wt 186.5 lb

## 2015-11-19 DIAGNOSIS — Z006 Encounter for examination for normal comparison and control in clinical research program: Secondary | ICD-10-CM

## 2015-11-19 MED ORDER — PITAVASTATIN CALCIUM 4 MG PO TABS: 1.0000 | ORAL_TABLET | Freq: Every day | ORAL | Status: DC

## 2015-11-19 NOTE — Progress Notes (Signed)
[name] here for entry visit [phone] [id number]: A Randomized Trial to Prevent Vascular Events in HIV (The REPRIEVE Study). After eligibility was verified, he was randomized to Pitavastatin 4mg  or placebo and he will start medication today. Reviewed possible side effects. Instructed to call if he develops any muscle aches/weakness, yellowing of skin/eyes or stomach pain. States understanding. He has contact information should he have any questions or concerns. Next visit is scheduled for 7/5 @ 9:00am.

## 2015-11-25 ENCOUNTER — Ambulatory Visit: Payer: Medicare Other

## 2015-11-25 DIAGNOSIS — F4310 Post-traumatic stress disorder, unspecified: Secondary | ICD-10-CM

## 2015-11-25 NOTE — BH Specialist Note (Signed)
Jordan Calderon was in a pleasant mood today, reporting that things are going well for him lately.  He completed some paperwork for [facility] today.  He said he has been staying busy with yardwork and housework. He asked if I thought it would be helpful to him to have internet access at his home and I expounded on the multiple benefits of this from a therapeutic standpoint, including therapy resources, videos on Youtube on mindfulness, access to sleep meditations, etc. He said he would talk to his sister about helping him with this.  Plan to meet again in one week. Franne FortsKenny Eryx Zane, LCSW

## 2015-12-01 ENCOUNTER — Other Ambulatory Visit: Payer: Self-pay | Admitting: Internal Medicine

## 2015-12-01 DIAGNOSIS — F4310 Post-traumatic stress disorder, unspecified: Secondary | ICD-10-CM

## 2015-12-02 ENCOUNTER — Ambulatory Visit: Payer: Medicare Other

## 2015-12-02 ENCOUNTER — Other Ambulatory Visit: Payer: Self-pay | Admitting: *Deleted

## 2015-12-02 DIAGNOSIS — F4310 Post-traumatic stress disorder, unspecified: Secondary | ICD-10-CM

## 2015-12-02 DIAGNOSIS — F411 Generalized anxiety disorder: Secondary | ICD-10-CM

## 2015-12-02 MED ORDER — LORAZEPAM 2 MG PO TABS: 2.0000 mg | ORAL_TABLET | Freq: Two times a day (BID) | ORAL | Status: DC | PRN

## 2015-12-02 NOTE — BH Specialist Note (Signed)
Jordan Calderon was pleasant again today, reporting that his sister is now confiding in him about her struggles with her oldest son, who just finished his freshman year in college.  This makes him feel good - that she now talks to him about her problems and isn't focusing so much on him.  We completed a Comprehensive Clinical Assessment for [facility] and completed goals of maintaining his good mood and reducing his anxiety.  We agreed to talk next week about him working toward getting his driver's license back and also I told him we would discuss cognitive behavioral concepts - negative thinking, etc.   Franne FortsKenny Nahzir Pohle, LCSW

## 2015-12-09 ENCOUNTER — Ambulatory Visit: Payer: Medicare Other

## 2015-12-23 ENCOUNTER — Ambulatory Visit (INDEPENDENT_AMBULATORY_CARE_PROVIDER_SITE_OTHER): Payer: Self-pay

## 2015-12-23 ENCOUNTER — Encounter: Payer: Medicare Other | Admitting: *Deleted

## 2015-12-23 ENCOUNTER — Other Ambulatory Visit: Payer: Self-pay | Admitting: Internal Medicine

## 2015-12-23 ENCOUNTER — Telehealth: Payer: Self-pay | Admitting: *Deleted

## 2015-12-23 VITALS — BP 162/105 | HR 63 | Temp 97.6°F | Resp 16 | Wt 185.8 lb

## 2015-12-23 DIAGNOSIS — Z006 Encounter for examination for normal comparison and control in clinical research program: Secondary | ICD-10-CM

## 2015-12-23 DIAGNOSIS — F4323 Adjustment disorder with mixed anxiety and depressed mood: Secondary | ICD-10-CM

## 2015-12-23 DIAGNOSIS — F4310 Post-traumatic stress disorder, unspecified: Secondary | ICD-10-CM

## 2015-12-23 LAB — COMPREHENSIVE METABOLIC PANEL
ALT: 19 U/L (ref 9–46)
AST: 18 U/L (ref 10–35)
Albumin: 4.4 g/dL (ref 3.6–5.1)
Alkaline Phosphatase: 91 U/L (ref 40–115)
BILIRUBIN TOTAL: 0.5 mg/dL (ref 0.2–1.2)
BUN: 17 mg/dL (ref 7–25)
CHLORIDE: 104 mmol/L (ref 98–110)
CO2: 24 mmol/L (ref 20–31)
CREATININE: 1.08 mg/dL (ref 0.70–1.33)
Calcium: 9.1 mg/dL (ref 8.6–10.3)
Glucose, Bld: 69 mg/dL (ref 65–99)
Potassium: 4.2 mmol/L (ref 3.5–5.3)
SODIUM: 136 mmol/L (ref 135–146)
TOTAL PROTEIN: 7.8 g/dL (ref 6.1–8.1)

## 2015-12-23 NOTE — Telephone Encounter (Addendum)
Spoke with Dr. Orvan Falconerampbell. He will work together with Tennis Mustassie Stewart for a plan to help [place] with withdrawal symptoms.  He will be able to refill the prescriptions tomorrow.  Patient in agreement with the plan. Pharm[facility]: [facility] [id number] - [place], [place] - [address]  [phone]
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
## 2015-12-24 NOTE — Telephone Encounter (Signed)
Called in Xanax 1 mg PO TID PRN anxiety x 1 month for Jordan Calderon.  They will cancel his Ativan Rx. He will need a new Rx for Ativan once his Xanax is gone.

## 2015-12-29 ENCOUNTER — Encounter: Payer: Self-pay | Admitting: Internal Medicine

## 2015-12-29 ENCOUNTER — Ambulatory Visit (INDEPENDENT_AMBULATORY_CARE_PROVIDER_SITE_OTHER): Payer: Medicare Other | Admitting: Internal Medicine

## 2015-12-29 ENCOUNTER — Ambulatory Visit: Payer: Medicare Other

## 2015-12-29 VITALS — BP 119/81 | HR 67 | Temp 97.6°F | Wt 181.0 lb

## 2015-12-29 DIAGNOSIS — B20 Human immunodeficiency virus [HIV] disease: Secondary | ICD-10-CM

## 2015-12-29 DIAGNOSIS — F411 Generalized anxiety disorder: Secondary | ICD-10-CM | POA: Diagnosis present

## 2015-12-29 MED ORDER — LORAZEPAM 2 MG PO TABS: 2.0000 mg | ORAL_TABLET | Freq: Two times a day (BID) | ORAL | Status: DC | PRN
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
## 2015-12-29 NOTE — Assessment & Plan Note (Signed)
Patient is in good spirits and remains adherent to his ART.  His infection is much better controlled since restarting his antiretroviral therapy a year ago.  He states calendar, managing his sister, and simpler regimen have contributed to better management of his HIV.  Will draw VL and CD4 today.  He will continue his current antiretroviral regimen and follow-up after lab work in 3 months.

## 2015-12-30 LAB — T-HELPER CELL (CD4) - (RCID CLINIC ONLY)
CD4 T CELL ABS: 240 /uL — AB (ref 400–2700)
CD4 T CELL HELPER: 14 % — AB (ref 33–55)

## 2015-12-31 ENCOUNTER — Telehealth: Payer: Self-pay

## 2015-12-31 LAB — HIV-1 RNA QUANT-NO REFLEX-BLD
HIV 1 RNA Quant: 66 copies/mL — ABNORMAL HIGH (ref <20)
HIV-1 RNA QUANT, LOG: 1.82 {Log_copies}/mL — AB (ref <1.30)
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
## 2015-12-31 NOTE — Telephone Encounter (Signed)
Called patient to notify him that he can have Ativan filled, but he will just have to return the Xanax back to pharmacy. Patient agreed and stated understanding. Patient stated using only 2 pills, because he did try to take medication but did not like how medication made him sleep until noon. Called pharmacist Victorino DikeJennifer and relayed information that patient will be bringing medication and it is ok to fill Lorazepam. Herschel Fleagle,LPN

## 2015-12-31 NOTE — Telephone Encounter (Signed)
Please call [name] and let him know that in order to get his lorazepam prescription he will need to take all of the Xanax back to his pharmacy.

## 2016-01-13 ENCOUNTER — Ambulatory Visit: Payer: Medicare Other

## 2016-01-13 DIAGNOSIS — F411 Generalized anxiety disorder: Secondary | ICD-10-CM

## 2016-01-13 DIAGNOSIS — F331 Major depressive disorder, recurrent, moderate: Secondary | ICD-10-CM

## 2016-01-13 NOTE — BH Specialist Note (Signed)
Corbett reports that he is doing well lately and that he finally got back on Paxil. However, for some reason, he was given Xanax in place of Ativan and he is not liking it as much.  He told me he was taking 3 mgs at night and I suggested he talk with the pharmacist or doctor about perhaps taking just 1 mg.  He also talked some about his childhood and how his father took him on a trip around the country when he was a teen.  Plan to meet again in one week. [name], LCSW

## 2016-01-20 ENCOUNTER — Ambulatory Visit: Payer: Medicare Other

## 2016-01-25 ENCOUNTER — Other Ambulatory Visit: Payer: Self-pay | Admitting: *Deleted

## 2016-01-25 ENCOUNTER — Other Ambulatory Visit: Payer: Self-pay

## 2016-01-25 DIAGNOSIS — F411 Generalized anxiety disorder: Secondary | ICD-10-CM

## 2016-01-25 MED ORDER — LORAZEPAM 2 MG PO TABS: 2.0000 mg | ORAL_TABLET | Freq: Two times a day (BID) | ORAL | 2 refills | Status: DC | PRN

## 2016-01-27 ENCOUNTER — Other Ambulatory Visit: Payer: Self-pay | Admitting: Internal Medicine

## 2016-01-27 ENCOUNTER — Ambulatory Visit: Payer: Medicare Other
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
## 2016-03-18 ENCOUNTER — Encounter (INDEPENDENT_AMBULATORY_CARE_PROVIDER_SITE_OTHER): Payer: Medicare Other | Admitting: *Deleted

## 2016-03-18 DIAGNOSIS — Z23 Encounter for immunization: Secondary | ICD-10-CM

## 2016-03-18 DIAGNOSIS — Z006 Encounter for examination for normal comparison and control in clinical research program: Secondary | ICD-10-CM

## 2016-03-18 NOTE — Progress Notes (Signed)
[name] is here for month 4 visit [phone], A Randomized Trial to Prevent Vascular Events in HIV (The REPRIEVE Study). In good spirits today. Smiling, states that he is "doing much better" and that he had "tightened up his circle of friends" to avoid anymore issues. No new complaints or concerns verbalized. Influenza vaccine was given (R) deltoid. Site unremarkable. He will return in February for his next study visit.

## 2016-04-05 ENCOUNTER — Ambulatory Visit: Payer: Medicare Other | Admitting: Internal Medicine

## 2016-04-18 ENCOUNTER — Ambulatory Visit (INDEPENDENT_AMBULATORY_CARE_PROVIDER_SITE_OTHER): Payer: Medicare Other | Admitting: Internal Medicine

## 2016-04-18 ENCOUNTER — Encounter: Payer: Self-pay | Admitting: Internal Medicine

## 2016-04-18 DIAGNOSIS — F411 Generalized anxiety disorder: Secondary | ICD-10-CM

## 2016-04-18 DIAGNOSIS — B20 Human immunodeficiency virus [HIV] disease: Secondary | ICD-10-CM | POA: Diagnosis present

## 2016-04-18 NOTE — Assessment & Plan Note (Signed)
He has chronic, generalized anxiety disorder. He is doing much better over the past year. I believe that counseling has helped. He met with her mental health counselor, Curley Spice, today and will arrange a follow-up visit. He will continue on lorazepam as needed.

## 2016-04-18 NOTE — Progress Notes (Signed)
Patient Active Problem List   Diagnosis Date Noted  . Human immunodeficiency virus (HIV) disease ([place]) 04/06/2006    Priority: High  . Gingivitis 08/25/2014    Priority: Medium  . Former cigarette smoker 07/29/2008    Priority: Medium  . Anxiety state 04/06/2006    Priority: Medium  . SUBSTANCE ABUSE, MULTIPLE 04/06/2006    Priority: Medium  . Depression 04/06/2006    Priority: Medium  . Hepatitis C 04/06/2006    Priority: Low  . UPPER RESPIRATORY INFECTION, ACUTE 07/28/2009  . FRACTURE, RIB, LEFT 07/28/2009  . CANDIDAL ESOPHAGITIS 04/06/2006  . DENTAL CARIES 04/06/2006  . ANAL FISSURE 04/06/2006    Patient's Medications  New Prescriptions   No medications on file  Previous Medications   EMTRICITABINE-TENOFOVIR AF (DESCOVY) 200-25 MG TABLET    Take 1 tablet by mouth daily.   LORAZEPAM (ATIVAN) 2 MG TABLET    Take 1 tablet (2 mg total) by mouth 2 (two) times daily as needed. for anxiety   PAROXETINE (PAXIL) 40 MG TABLET    TAKE 1 TABLET BY MOUTH EVERY DAY   PITAVASTATIN CALCIUM 4 MG TABS    Take 1 tablet (4 mg total) by mouth daily. Study provided. May be placebo. DO NOT FILL   PREZCOBIX 800-150 MG TABLET    TAKE 1 TABLET BY MOUTH EVERY DAY WITH FOOD. SWALLOW WHOLE. DO NOT CRUSH, BREAK, OR CHEW TABLET  Modified Medications   No medications on file  Discontinued Medications   No medications on file    Subjective: Jordan Calderon is in for his routine HIV follow-up visit. He states that he is doing well and is in "a very good place". He has all of his medications with him as well as his medication calendar. He states he has not missed a single dose. He is no longer smoking cigarettes. He is working at [employer] now. He feels like his depression is slowly coming under better control. He still has problems with his chronic anxiety but feels like he is becoming better at keeping it under control.  Review of Systems: Review of Systems  Constitutional: Negative for  chills, diaphoresis, fever, malaise/fatigue and weight loss.  HENT: Negative for sore throat.   Respiratory: Negative for cough, sputum production and shortness of breath.   Cardiovascular: Negative for chest pain.  Gastrointestinal: Negative for abdominal pain, diarrhea, nausea and vomiting.  Genitourinary: Negative for dysuria.  Musculoskeletal: Negative for joint pain and myalgias.  Skin: Negative for rash.  Neurological: Negative for dizziness and headaches.  Psychiatric/Behavioral: Positive for depression. Negative for substance abuse. The patient is nervous/anxious.     Past Medical History:  Diagnosis Date  . Anxiety   . Cellulitis 02/11/2014   LEFT ARM  . Depression   . Headache(784.0)   . HIV (human immunodeficiency virus infection) ([place])     Social History  Substance Use Topics  . Smoking status: Former Smoker    Packs/day: 0.10    Years: 30.00    Types: Cigarettes    Quit date: 11/19/2014  . Smokeless tobacco: Never Used  . Alcohol use No    Family History  Problem Relation Age of Onset  . Adopted: Yes    Allergies  Allergen Reactions  . Sulfonamide Derivatives     REACTION: itching    Objective:  Vitals:   04/18/16 1615 04/18/16 1624  BP: (!) 172/101 (!) 148/100  Pulse: 71 67  Temp: 98.3 F (36.8  C)   TempSrc: Oral   Weight: 173 lb 12 oz (78.8 kg)   Height: 5' 9.5" (1.765 m)    Body mass index is 25.29 kg/m.  Physical Exam  Constitutional: He is oriented to person, place, and time.  He has fidgety as usual but in very good spirits.  HENT:  Mouth/Throat: No oropharyngeal exudate.  Eyes: Conjunctivae are normal.  Cardiovascular: Normal rate and regular rhythm.   No murmur heard. Pulmonary/Chest: Effort normal and breath sounds normal. He has no wheezes. He has no rales.  Abdominal: Soft. He exhibits no mass. There is no tenderness.  Musculoskeletal: Normal range of motion.  Neurological: He is alert and oriented to person, place, and time.    Skin: No rash noted.  Psychiatric: Mood and affect normal.    Lab Results Lab Results  Component Value Date   WBC 5.9 09/08/2015   HGB 15.3 09/08/2015   HCT 44.3 09/08/2015   MCV 94.9 09/08/2015   PLT 174 09/08/2015    Lab Results  Component Value Date   CREATININE 1.08 12/23/2015   BUN 17 12/23/2015   NA 136 12/23/2015   K 4.2 12/23/2015   CL 104 12/23/2015   CO2 24 12/23/2015    Lab Results  Component Value Date   ALT 19 12/23/2015   AST 18 12/23/2015   ALKPHOS 91 12/23/2015   BILITOT 0.5 12/23/2015    Lab Results  Component Value Date   CHOL 207 (H) 11/02/2015   HDL 29 (L) 11/02/2015   LDLCALC 136 (H) 11/02/2015   TRIG 212 (H) 11/02/2015   CHOLHDL 7.1 (H) 11/02/2015   HIV 1 RNA Quant (copies/mL)  Date Value  12/29/2015 66 (H)  09/08/2015 51 (H)  06/09/2015 50 (H)   CD4 T Cell Abs (/uL)  Date Value  12/29/2015 240 (L)  09/08/2015 210 (L)  06/09/2015 220 (L)     Problem List Items Addressed This Visit      High   Human immunodeficiency virus (HIV) disease ([place])    His infection has come under much better control since restarting medication a year and a half ago. He is having steady CD4 reconstitution. He will continue his current antiretroviral regimen and follow-up after lab work in 3 months.        Medium   Anxiety state    He has chronic, generalized anxiety disorder. He is doing much better over the past year. I believe that counseling has helped. He met with her mental health counselor, Curley Spice, today and will arrange a follow-up visit. He will continue on lorazepam as needed.       Other Visit Diagnoses   None.       Michel Bickers, MD [facility] [phone] pager   [phone] cell 04/18/2016, 4:56 PM

## 2016-04-18 NOTE — Assessment & Plan Note (Signed)
His depression is under much better control and as a result his adherence with medication and visits has improved significantly. He will continue paroxitene.

## 2016-04-18 NOTE — Assessment & Plan Note (Signed)
His infection has come under much better control since restarting medication a year and a half ago. He is having steady CD4 reconstitution. He will continue his current antiretroviral regimen and follow-up after lab work in 3 months.

## 2016-04-19 ENCOUNTER — Other Ambulatory Visit: Payer: Self-pay | Admitting: Internal Medicine

## 2016-04-19 DIAGNOSIS — B20 Human immunodeficiency virus [HIV] disease: Secondary | ICD-10-CM

## 2016-04-19 DIAGNOSIS — F411 Generalized anxiety disorder: Secondary | ICD-10-CM

## 2016-04-19 MED ORDER — LORAZEPAM 2 MG PO TABS: 2.0000 mg | ORAL_TABLET | Freq: Two times a day (BID) | ORAL | 3 refills | Status: DC | PRN

## 2016-05-03 ENCOUNTER — Ambulatory Visit: Payer: Medicare Other

## 2016-05-03 DIAGNOSIS — F4310 Post-traumatic stress disorder, unspecified: Secondary | ICD-10-CM

## 2016-05-03 NOTE — BH Specialist Note (Signed)
Jordan Calderon was somewhat agitated today, though pleasant, as he talked about how his sister continues to stay on him and "micromanage" him about every little thing when she visits. She wrote down 3 things for him to give me: that he is talking fast, that he is irritable/angry, and that he is moving around fast.  She thinks he needs more medication. After he described her recent visit, it appears to me that she is the one who needs treatment more than him. According to him, she obsesses over minute details of how clean he keeps the house (they co-own it) and accuses him of "throwing parties" all the time, which he doesn't do.  Plan to meet again in 2 days. Franne FortsKenny Classie Weng, LCSW

## 2016-05-05 ENCOUNTER — Ambulatory Visit: Payer: Medicare Other

## 2016-05-09 ENCOUNTER — Ambulatory Visit: Payer: Medicare Other

## 2016-07-06 ENCOUNTER — Other Ambulatory Visit: Payer: Medicare Other

## 2016-07-07 ENCOUNTER — Other Ambulatory Visit: Payer: Medicare Other

## 2016-07-15 ENCOUNTER — Other Ambulatory Visit: Payer: Self-pay | Admitting: Internal Medicine

## 2016-07-19 ENCOUNTER — Ambulatory Visit (INDEPENDENT_AMBULATORY_CARE_PROVIDER_SITE_OTHER): Payer: Medicare Other | Admitting: Internal Medicine

## 2016-07-19 ENCOUNTER — Encounter: Payer: Self-pay | Admitting: Internal Medicine

## 2016-07-19 DIAGNOSIS — F411 Generalized anxiety disorder: Secondary | ICD-10-CM | POA: Diagnosis not present

## 2016-07-19 DIAGNOSIS — K0510 Chronic gingivitis, plaque induced: Secondary | ICD-10-CM | POA: Diagnosis not present

## 2016-07-19 DIAGNOSIS — B20 Human immunodeficiency virus [HIV] disease: Secondary | ICD-10-CM | POA: Diagnosis present

## 2016-07-19 DIAGNOSIS — B182 Chronic viral hepatitis C: Secondary | ICD-10-CM | POA: Diagnosis not present

## 2016-07-19 NOTE — Progress Notes (Signed)
Patient Active Problem List   Diagnosis Date Noted  . Human immunodeficiency virus (HIV) disease (HCC) 04/06/2006    Priority: High  . Gingivitis 08/25/2014    Priority: Medium  . Former cigarette smoker 07/29/2008    Priority: Medium  . Anxiety state 04/06/2006    Priority: Medium  . SUBSTANCE ABUSE, MULTIPLE 04/06/2006    Priority: Medium  . Depression 04/06/2006    Priority: Medium  . Hepatitis C 04/06/2006    Priority: Low  . DENTAL CARIES 04/06/2006  . ANAL FISSURE 04/06/2006    Patient's Medications  New Prescriptions   No medications on file  Previous Medications   DESCOVY 200-25 MG TABLET    TAKE 1 TABLET BY MOUTH DAILY   LORAZEPAM (ATIVAN) 2 MG TABLET    Take 1 tablet (2 mg total) by mouth 2 (two) times daily as needed. for anxiety   PAROXETINE (PAXIL) 40 MG TABLET    TAKE 1 TABLET BY MOUTH EVERY DAY   PITAVASTATIN CALCIUM 4 MG TABS    Take 1 tablet (4 mg total) by mouth daily. Study provided. May be placebo. DO NOT FILL   PREZCOBIX 800-150 MG TABLET    TAKE 1 TABLET BY MOUTH EVERY DAY WITH FOOD. SWALLOW WHOLE. DO NOT CRUSH, BREAK, OR CHEW TABLET  Modified Medications   No medications on file  Discontinued Medications   No medications on file    Subjective: Armani is in for his routine HIV follow-up visit. He denies missing any doses of his Descovy or Prezcobix. He takes them each morning along with his REPRIEVE study medication. He states that his mood has been much better recently. He's not having nearly as much anxiety or depression. He states that he is getting along with his sister, [name], much better.   Review of Systems: Review of Systems  Constitutional: Negative for chills, diaphoresis, fever, malaise/fatigue and weight loss.  HENT: Negative for sore throat.   Respiratory: Negative for cough, sputum production and shortness of breath.   Cardiovascular: Negative for chest pain.  Gastrointestinal: Negative for abdominal pain, diarrhea, nausea  and vomiting.  Genitourinary: Negative for dysuria and frequency.  Musculoskeletal: Negative for joint pain and myalgias.  Skin: Negative for rash.  Neurological: Negative for dizziness and headaches.  Psychiatric/Behavioral: Negative for depression and substance abuse. The patient is not nervous/anxious.     Past Medical History:  Diagnosis Date  . Anxiety   . Cellulitis 02/11/2014   LEFT ARM  . Depression   . Headache(784.0)   . HIV (human immunodeficiency virus infection) (HCC)     Social History  Substance Use Topics  . Smoking status: Former Smoker    Packs/day: 0.10    Years: 30.00    Types: Cigarettes    Quit date: 11/19/2014  . Smokeless tobacco: Never Used  . Alcohol use No    Family History  Problem Relation Age of Onset  . Adopted: Yes    Allergies  Allergen Reactions  . Sulfonamide Derivatives     REACTION: itching    Objective:  Vitals:   07/19/16 0846  BP: (!) 142/86  Pulse: 67  Temp: 97.9 F (36.6 C)  TempSrc: Oral  Weight: 182 lb (82.6 kg)  Height: 5\' 9"  (1.753 m)   Body mass index is 26.88 kg/m.  Physical Exam  Constitutional: He is oriented to person, place, and time.  He is in good spirits today. He has all of his medications with  him.  HENT:  Mouth/Throat: No oropharyngeal exudate.  His teeth are in good condition. He is not having any more gum problems.  Eyes: Conjunctivae are normal.  Cardiovascular: Normal rate and regular rhythm.   No murmur heard. Pulmonary/Chest: Breath sounds normal.  Abdominal: Soft. He exhibits no mass. There is no tenderness.  Musculoskeletal: Normal range of motion.  Neurological: He is alert and oriented to person, place, and time.  Skin: No rash noted.  Psychiatric: Mood and affect normal.    Lab Results Lab Results  Component Value Date   WBC 5.9 09/08/2015   HGB 15.3 09/08/2015   HCT 44.3 09/08/2015   MCV 94.9 09/08/2015   PLT 174 09/08/2015    Lab Results  Component Value Date    CREATININE 1.08 12/23/2015   BUN 17 12/23/2015   NA 136 12/23/2015   K 4.2 12/23/2015   CL 104 12/23/2015   CO2 24 12/23/2015    Lab Results  Component Value Date   ALT 19 12/23/2015   AST 18 12/23/2015   ALKPHOS 91 12/23/2015   BILITOT 0.5 12/23/2015    Lab Results  Component Value Date   CHOL 207 (H) 11/02/2015   HDL 29 (L) 11/02/2015   LDLCALC 136 (H) 11/02/2015   TRIG 212 (H) 11/02/2015   CHOLHDL 7.1 (H) 11/02/2015   HIV 1 RNA Quant (copies/mL)  Date Value  12/29/2015 66 (H)  09/08/2015 51 (H)  06/09/2015 50 (H)   CD4 T Cell Abs (/uL)  Date Value  12/29/2015 240 (L)  09/08/2015 210 (L)  06/09/2015 220 (L)     Problem List Items Addressed This Visit      High   Human immunodeficiency virus (HIV) disease (HCC)    His adherence has improved greatly over the past year and a half. He will continue his current antiretroviral regimen and get blood work this week. He will follow-up in 6 months.        Medium   Anxiety state    His anxiety is under much better control. He will schedule a follow-up visit with our behavioral health counselor, [name].      Gingivitis    His gingivitis has resolved. He will schedule a routine follow-up visit in the dental clinic.        Low   Hepatitis C    He has spontaneous resolution of his hepatitis C.           Cliffton AstersJohn Sherol Sabas, MD [facility] [phone] pager   [phone] cell 07/19/2016, 9:08 AM

## 2016-07-19 NOTE — Assessment & Plan Note (Signed)
His gingivitis has resolved. He will schedule a routine follow-up visit in the dental clinic.

## 2016-07-19 NOTE — Assessment & Plan Note (Signed)
He has spontaneous resolution of his hepatitis C.

## 2016-07-19 NOTE — Assessment & Plan Note (Signed)
His adherence has improved greatly over the past year and a half. He will continue his current antiretroviral regimen and get blood work this week. He will follow-up in 6 months.

## 2016-07-19 NOTE — Assessment & Plan Note (Signed)
His anxiety is under much better control. He will schedule a follow-up visit with our behavioral health counselor, [name].

## 2016-07-22 ENCOUNTER — Encounter (INDEPENDENT_AMBULATORY_CARE_PROVIDER_SITE_OTHER): Payer: Self-pay | Admitting: *Deleted

## 2016-07-22 DIAGNOSIS — Z006 Encounter for examination for normal comparison and control in clinical research program: Secondary | ICD-10-CM

## 2016-07-22 DIAGNOSIS — B20 Human immunodeficiency virus [HIV] disease: Secondary | ICD-10-CM

## 2016-07-22 LAB — CBC
HCT: 45.7 % (ref 38.5–50.0)
HEMOGLOBIN: 15.8 g/dL (ref 13.2–17.1)
MCH: 32.5 pg (ref 27.0–33.0)
MCHC: 34.6 g/dL (ref 32.0–36.0)
MCV: 94 fL (ref 80.0–100.0)
MPV: 8.9 fL (ref 7.5–12.5)
Platelets: 226 10*3/uL (ref 140–400)
RBC: 4.86 MIL/uL (ref 4.20–5.80)
RDW: 13.9 % (ref 11.0–15.0)
WBC: 7.3 10*3/uL (ref 3.8–10.8)

## 2016-07-22 LAB — T-HELPER CELL (CD4) - (RCID CLINIC ONLY)
CD4 % Helper T Cell: 18 % — ABNORMAL LOW (ref 33–55)
CD4 T CELL ABS: 390 /uL — AB (ref 400–2700)

## 2016-07-22 LAB — COMPREHENSIVE METABOLIC PANEL
ALBUMIN: 4.3 g/dL (ref 3.6–5.1)
ALT: 14 U/L (ref 9–46)
AST: 16 U/L (ref 10–35)
Alkaline Phosphatase: 91 U/L (ref 40–115)
BUN: 13 mg/dL (ref 7–25)
CO2: 29 mmol/L (ref 20–31)
CREATININE: 1.26 mg/dL (ref 0.70–1.33)
Calcium: 9.2 mg/dL (ref 8.6–10.3)
Chloride: 102 mmol/L (ref 98–110)
Glucose, Bld: 92 mg/dL (ref 65–99)
Potassium: 4.7 mmol/L (ref 3.5–5.3)
SODIUM: 138 mmol/L (ref 135–146)
TOTAL PROTEIN: 7.9 g/dL (ref 6.1–8.1)
Total Bilirubin: 0.6 mg/dL (ref 0.2–1.2)

## 2016-07-22 NOTE — Progress Notes (Signed)
[name] is here for month 8 visit [phone]: A Randomized Trial to Prevent Vascular Events in HIV (The REPRIEVE Study).  Smiling and in good spirits. No new complaints or concerns verbalized.Denies any muscle aches or weakness. States excellent adherence with his medications. No missed doses.  Next visit is scheduled for 5/30 @ 9:00am.

## 2016-07-26 LAB — HIV-1 RNA QUANT-NO REFLEX-BLD
HIV 1 RNA QUANT: 22 {copies}/mL — AB
HIV-1 RNA QUANT, LOG: 1.34 {Log_copies}/mL — AB

## 2016-08-11 ENCOUNTER — Other Ambulatory Visit: Payer: Self-pay | Admitting: Internal Medicine

## 2016-08-11 DIAGNOSIS — F411 Generalized anxiety disorder: Secondary | ICD-10-CM

## 2016-08-12 NOTE — Telephone Encounter (Signed)
Reply from Dr Orvan Falconerampbell received.  RN will attach a message to the telephone refill that the patient must make and keep an appt with K. Shana ChuteShore, [facility].

## 2016-08-12 NOTE — Telephone Encounter (Signed)
-----   Message from Cliffton AstersJohn Campbell, MD sent at 08/12/2016 11:39 AM EST ----- Regarding: RE: Patient has not made f/u appt with Bernette RedbirdKenny, ?refill Ativan He may have 1 refill with the understanding that he must follow-up with Bernette RedbirdKenny.  John ----- Message ----- From: Troy Sineenise D Estridge, RN Sent: 08/12/2016  11:14 AM To: Cliffton AstersJohn Campbell, MD Subject: Patient has not made f/u appt with Bernette RedbirdKenny, ?r#  I was unable to reach [facility] by phone, no voice mail either.  He has not made a follow-up appt with Bernette RedbirdKenny after you saw him last.  Do you want to refill the Ativan rx?   Please respond to the Triage Pool.  Thanks, TEPP[employer] PartnersDenise

## 2016-08-12 NOTE — Telephone Encounter (Signed)
Unable to reach patient to schedule Mental Health Counseling appt.  RN will send a message to Dr. Orvan Falconerampbell regarding refilling the Ativan.

## 2016-08-22 ENCOUNTER — Ambulatory Visit: Payer: Medicare Other

## 2016-08-29 ENCOUNTER — Ambulatory Visit: Payer: Medicare Other

## 2016-09-05 ENCOUNTER — Other Ambulatory Visit: Payer: Self-pay | Admitting: Internal Medicine

## 2016-09-05 ENCOUNTER — Ambulatory Visit: Payer: Medicare Other

## 2016-09-20 ENCOUNTER — Other Ambulatory Visit: Payer: Self-pay | Admitting: Internal Medicine

## 2016-09-20 DIAGNOSIS — B20 Human immunodeficiency virus [HIV] disease: Secondary | ICD-10-CM

## 2016-09-21 ENCOUNTER — Other Ambulatory Visit: Payer: Self-pay | Admitting: *Deleted

## 2016-09-21 DIAGNOSIS — F411 Generalized anxiety disorder: Secondary | ICD-10-CM

## 2016-09-21 MED ORDER — LORAZEPAM 2 MG PO TABS: 2.0000 mg | ORAL_TABLET | Freq: Two times a day (BID) | ORAL | 2 refills | Status: DC | PRN

## 2016-09-21 NOTE — Telephone Encounter (Signed)
Dr. Orvan Falconer approved refill of this medication.

## 2016-11-11 ENCOUNTER — Emergency Department (HOSPITAL_BASED_OUTPATIENT_CLINIC_OR_DEPARTMENT_OTHER)
Admission: EM | Admit: 2016-11-11 | Discharge: 2016-11-11 | Disposition: A | Discharge status: Home / Self Care | Payer: Medicare Other | Attending: Emergency Medicine | Admitting: Emergency Medicine

## 2016-11-11 ENCOUNTER — Encounter (HOSPITAL_BASED_OUTPATIENT_CLINIC_OR_DEPARTMENT_OTHER): Payer: Self-pay

## 2016-11-11 DIAGNOSIS — Z87891 Personal history of nicotine dependence: Secondary | ICD-10-CM | POA: Diagnosis not present

## 2016-11-11 DIAGNOSIS — B20 Human immunodeficiency virus [HIV] disease: Secondary | ICD-10-CM | POA: Insufficient documentation

## 2016-11-11 DIAGNOSIS — H6092 Unspecified otitis externa, left ear: Secondary | ICD-10-CM | POA: Insufficient documentation

## 2016-11-11 DIAGNOSIS — H60502 Unspecified acute noninfective otitis externa, left ear: Secondary | ICD-10-CM

## 2016-11-11 DIAGNOSIS — H9202 Otalgia, left ear: Secondary | ICD-10-CM | POA: Diagnosis present

## 2016-11-11 DIAGNOSIS — Z79899 Other long term (current) drug therapy: Secondary | ICD-10-CM | POA: Insufficient documentation

## 2016-11-11 MED ORDER — NEOMYCIN-POLYMYXIN-HC 3.5-10000-1 OT SUSP: 4.0000 [drp] | OTIC | 0 refills | Status: AC

## 2016-11-11 MED FILL — NEOMYCIN-POLYMYXIN-HC EAR S: 3.5-10000-1 | 8 days supply | Qty: 10 | Fill #0

## 2016-11-11 NOTE — Discharge Instructions (Signed)
Please read and follow all provided instructions.  Your diagnoses today include:  1. Acute otitis externa of left ear, unspecified type     Tests performed today include: Vital signs. See below for your results today.   Medications prescribed:  Take as prescribed   Home care instructions:  Follow any educational materials contained in this packet.  Follow-up instructions: Please follow-up with your primary care provider for further evaluation of symptoms and treatment   Return instructions:  Please return to the Emergency Department if you do not get better, if you get worse, or new symptoms OR  - Fever (temperature greater than 101.30F)  - Bleeding that does not stop with holding pressure to the area    -Severe pain (please note that you may be more sore the day after your accident)  - Chest Pain  - Difficulty breathing  - Severe nausea or vomiting  - Inability to tolerate food and liquids  - Passing out  - Skin becoming red around your wounds  - Change in mental status (confusion or lethargy)  - New numbness or weakness    Please return if you have any other emergent concerns.  Additional Information:  Your vital signs today were: BP (!) 132/91 (BP Location: Left Arm)    Pulse 88    Temp 99.2 F (37.3 C) (Oral)    Resp 16    Ht 5\' 9"  (1.753 m)    Wt 72.6 kg (160 lb)    SpO2 98%    BMI 23.63 kg/m  If your blood pressure (BP) was elevated above 135/85 this visit, please have this repeated by your doctor within one month. ---------------

## 2016-11-11 NOTE — ED Triage Notes (Signed)
C/o left earache x 5 days-NAD-steady gait

## 2016-11-11 NOTE — ED Notes (Signed)
Pt directed to pharmacy to pick up Rx 

## 2016-11-11 NOTE — ED Provider Notes (Signed)
[facility] Provider Note   CSN: [id number] Arrival date & time: 11/11/16  1448  By signing my name below, I, Jordan Calderon, attest that this documentation has been prepared under the direction and in the presence of Audry Piliyler [name], PA-C. Electronically Signed: Cynda AcresHailei Calderon, Scribe. 11/11/16. 4:14 PM.  History   Chief Complaint Chief Complaint  Patient presents with  . Otalgia   HPI Comments: Jordan Calderon is a 56 y.o. male with no pertinent past medical history, who presents to the Emergency Department complaining of sudden-onset, constant left ear pain that began 5 days ago. Rates pain 8/10. Patient reports gradually worsening left ear pain. Patient states he has had several left ear infections in the past. Patient reports an associated sore throat. Patient reports taking ibuprofen and throat lozenges. Patient describes his pain as "throbbing". Patient denies any ear discharge, ear bleeding, fever, chills, trouble swallowing, cough, nausea, or vomiting.   The history is provided by the patient. No language interpreter was used.    Past Medical History:  Diagnosis Date  . Anxiety   . Cellulitis 02/11/2014   LEFT ARM  . Depression   . Headache(784.0)   . HIV (human immunodeficiency virus infection) [facility](HCC)     Patient Active Problem List   Diagnosis Date Noted  . Gingivitis 08/25/2014  . Former cigarette smoker 07/29/2008  . Human immunodeficiency virus (HIV) disease (HCC) 04/06/2006  . Hepatitis C 04/06/2006  . Anxiety state 04/06/2006  . SUBSTANCE ABUSE, MULTIPLE 04/06/2006  . Depression 04/06/2006  . DENTAL CARIES 04/06/2006  . ANAL FISSURE 04/06/2006    Past Surgical History:  Procedure Laterality Date  . arm surgery    . HEMORRHOID SURGERY         Home Medications    Prior to Admission medications   Medication Sig Start Date End Date Taking? Authorizing Provider  DESCOVY 200-25 MG tablet TAKE 1 TABLET BY MOUTH DAILY 09/20/16   Cliffton Astersampbell, John, MD    LORazepam (ATIVAN) 2 MG tablet Take 1 tablet (2 mg total) by mouth 2 (two) times daily as needed. for anxiety 09/21/16   Cliffton Astersampbell, John, MD  PARoxetine (PAXIL) 40 MG tablet TAKE 1 TABLET BY MOUTH EVERY DAY 12/23/15   Cliffton Astersampbell, John, MD  Pitavastatin Calcium 4 MG TABS Take 1 tablet (4 mg total) by mouth daily. Study provided. May be placebo. DO NOT FILL 11/19/15   Daiva EvesVan Dam, Lisette Grinderornelius N, MD  PREZCOBIX 800-150 MG tablet TAKE 1 TABLET BY MOUTH EVERY DAY WITH FOOD. SWALLOW WHOLE. DO NOT CRUSH, BREAK, OR CHEW TABLET 09/05/16   Cliffton Astersampbell, John, MD    Family History Family History  Problem Relation Age of Onset  . Adopted: Yes    Social History Social History  Substance Use Topics  . Smoking status: Former Smoker    Packs/day: 0.10    Years: 30.00    Types: Cigarettes    Quit date: 11/19/2014  . Smokeless tobacco: Never Used  . Alcohol use No     Allergies   Sulfonamide derivatives   Review of Systems Review of Systems  Constitutional: Negative for chills and fever.  HENT: Positive for ear pain (left ) and sore throat.   Gastrointestinal: Negative for diarrhea, nausea and vomiting.     Physical Exam Updated Vital Signs BP (!) 132/91 (BP Location: Left Arm)   Pulse 88   Temp 99.2 F (37.3 C) (Oral)   Resp 16   Ht 5\' 9"  (1.753 m)   Wt 160 lb (72.6  kg)   SpO2 98%   BMI 23.63 kg/m   Physical Exam  Constitutional: He is oriented to person, place, and time. He appears well-developed and well-nourished. No distress.  HENT:  Head: Normocephalic and atraumatic.  Right Ear: Tympanic membrane, external ear and ear canal normal.  Left Ear: Tympanic membrane normal.  Nose: Nose normal.  Mouth/Throat: Uvula is midline, oropharynx is clear and moist and mucous membranes are normal. No trismus in the jaw. No oropharyngeal exudate, posterior oropharyngeal erythema or tonsillar abscesses.  Left auditory canal erythematous with mild swelling. Tragus tender to palpation. TM intact without  swelling or erythema. No mastoid tenderness.   Eyes: Conjunctivae and EOM are normal. Pupils are equal, round, and reactive to light.  Neck: Normal range of motion. Neck supple. No tracheal deviation present.  Cardiovascular: Normal rate, regular rhythm, S1 normal, S2 normal, normal heart sounds, intact distal pulses and normal pulses.   Pulmonary/Chest: Effort normal and breath sounds normal. No respiratory distress. He has no decreased breath sounds. He has no wheezes. He has no rhonchi. He has no rales.  Abdominal: Soft. Normal appearance and bowel sounds are normal. There is no tenderness.  Musculoskeletal: Normal range of motion.  Neurological: He is alert and oriented to person, place, and time.  Skin: Skin is warm and dry.  Psychiatric: He has a normal mood and affect. His speech is normal and behavior is normal. Thought content normal.  Nursing note and vitals reviewed.    ED Treatments / Results  DIAGNOSTIC STUDIES: Oxygen Saturation is 98% on RA, normal by my interpretation.    COORDINATION OF CARE: 4:12 PM Discussed treatment plan with pt at bedside and pt agreed to plan, which includes antibiotics.   Labs (all labs ordered are listed, but only abnormal results are displayed) Labs Reviewed - No data to display  EKG  EKG Interpretation None       Radiology No results found.  Procedures Procedures (including critical care time)  Medications Ordered in ED Medications - No data to display   Initial Impression / Assessment and Plan / ED Course  I have reviewed the triage vital signs and the nursing notes.  Pertinent labs & imaging results that were available during my care of the patient were reviewed by me and considered in my medical decision making (see chart for details).  Final Clinical Impressions(s) / ED Diagnoses       {I have reviewed the relevant previous healthcare records.  {I obtained HPI from historian.   ED Course:  Assessment: Pt presenting  with otitis externa. No canal occlusion, Pt afebrile in NAD. Exam non concerning for mastoiditis, cellulitis or malignant OE. DC with otic ABX drops.  Advised PCP follow up in 2-3 days if no improvement with treatment or no complete resolution.    Disposition/Plan:  DC Home Additional Verbal discharge instructions given and discussed with patient.  Pt Instructed to f/u with PCP in the next week for evaluation and treatment of symptoms. Return precautions given Pt acknowledges and agrees with plan  Supervising Physician Loren Racer, MD   Final diagnoses:  Acute otitis externa of left ear, unspecified type    New Prescriptions New Prescriptions   No medications on file   I personally performed the services described in this documentation, which was scribed in my presence. The recorded information has been reviewed and is accurate.    Audry Pili, PA-C 11/11/16 1617    Loren Racer, MD 11/18/16 1343

## 2016-11-15 ENCOUNTER — Ambulatory Visit (INDEPENDENT_AMBULATORY_CARE_PROVIDER_SITE_OTHER): Payer: Medicare Other | Admitting: Internal Medicine

## 2016-11-15 ENCOUNTER — Encounter: Payer: Self-pay | Admitting: Internal Medicine

## 2016-11-15 ENCOUNTER — Ambulatory Visit (INDEPENDENT_AMBULATORY_CARE_PROVIDER_SITE_OTHER): Payer: Medicare Other | Admitting: Licensed Clinical Social Worker

## 2016-11-15 DIAGNOSIS — F411 Generalized anxiety disorder: Secondary | ICD-10-CM

## 2016-11-15 DIAGNOSIS — J029 Acute pharyngitis, unspecified: Secondary | ICD-10-CM

## 2016-11-15 DIAGNOSIS — B20 Human immunodeficiency virus [HIV] disease: Secondary | ICD-10-CM

## 2016-11-15 MED ORDER — AMOXICILLIN 500 MG PO CAPS: 500.0000 mg | ORAL_CAPSULE | Freq: Two times a day (BID) | ORAL | 0 refills | Status: DC

## 2016-11-15 NOTE — Assessment & Plan Note (Signed)
His infection has come under much better control and he is having CD4 reconstitution since restarting therapy 2 years ago. He will continue Descovy and Prezcobix and get repeat lab work today. He will follow-up in 3 months.

## 2016-11-15 NOTE — Assessment & Plan Note (Signed)
I will start him on amoxicillin.

## 2016-11-15 NOTE — Assessment & Plan Note (Signed)
Overall his anxiety and depression are better but he is still somewhat tenuous. We introduced him to our new behavioral health counselor, Vergia AlbertsSherry Royster.

## 2016-11-15 NOTE — Progress Notes (Signed)
Patient Active Problem List   Diagnosis Date Noted  . Human immunodeficiency virus (HIV) disease (HCC) 04/06/2006    Priority: High  . Former cigarette smoker 07/29/2008    Priority: Medium  . Anxiety state 04/06/2006    Priority: Medium  . SUBSTANCE ABUSE, MULTIPLE 04/06/2006    Priority: Medium  . Depression 04/06/2006    Priority: Medium  . Hepatitis C 04/06/2006    Priority: Low  . Pharyngitis 11/15/2016  . DENTAL CARIES 04/06/2006  . ANAL FISSURE 04/06/2006    Patient's Medications  New Prescriptions   AMOXICILLIN (AMOXIL) 500 MG CAPSULE    Take 1 capsule (500 mg total) by mouth 2 (two) times daily.  Previous Medications   DESCOVY 200-25 MG TABLET    TAKE 1 TABLET BY MOUTH DAILY   LORAZEPAM (ATIVAN) 2 MG TABLET    Take 1 tablet (2 mg total) by mouth 2 (two) times daily as needed. for anxiety   NEOMYCIN-POLYMYXIN-HYDROCORTISONE (CORTISPORIN) 3.5-10000-1 OTIC SUSPENSION    Place 4 drops into the left ear every 4 (four) hours.   PAROXETINE (PAXIL) 40 MG TABLET    TAKE 1 TABLET BY MOUTH EVERY DAY   PITAVASTATIN CALCIUM 4 MG TABS    Take 1 tablet (4 mg total) by mouth daily. Study provided. May be placebo. DO NOT FILL   PREZCOBIX 800-150 MG TABLET    TAKE 1 TABLET BY MOUTH EVERY DAY WITH FOOD. SWALLOW WHOLE. DO NOT CRUSH, BREAK, OR CHEW TABLET  Modified Medications   No medications on file  Discontinued Medications   No medications on file    Subjective: Partners in for his routine HIV follow-up visit. He denies missing any doses of his Descovy or Prezcobix. He has been slightly more anxious and depressed since finding out that [name], his behavioral health counselor is no longer working in this clinic. He recently developed some pain in his left ear and severe sore throat. He was started on acid trace and eardrops recently but has not had any improvement.   Review of Systems: Review of Systems  Constitutional: Positive for malaise/fatigue. Negative for  chills, diaphoresis, fever and weight loss.  HENT: Positive for ear pain and sore throat.   Respiratory: Negative for cough, sputum production and shortness of breath.   Cardiovascular: Negative for chest pain.  Gastrointestinal: Negative for abdominal pain, diarrhea, heartburn, nausea and vomiting.  Genitourinary: Negative for dysuria and frequency.  Musculoskeletal: Negative for joint pain and myalgias.  Skin: Negative for rash.  Neurological: Negative for dizziness and headaches.  Psychiatric/Behavioral: Positive for depression. Negative for substance abuse. The patient is nervous/anxious.     Past Medical History:  Diagnosis Date  . Anxiety   . Cellulitis 02/11/2014   LEFT ARM  . Depression   . Headache(784.0)   . HIV (human immunodeficiency virus infection) (HCC)     Social History  Substance Use Topics  . Smoking status: Former Smoker    Packs/day: 0.10    Years: 30.00    Types: Cigarettes    Quit date: 11/19/2014  . Smokeless tobacco: Never Used  . Alcohol use No    Family History  Problem Relation Age of Onset  . Adopted: Yes    Allergies  Allergen Reactions  . Sulfonamide Derivatives     REACTION: itching    Objective:  Vitals:   11/15/16 1009  Weight: 159 lb (72.1 kg)  Height: 5\' 9"  (1.753 m)   Body mass  index is 23.48 kg/m.  Physical Exam  Constitutional: He is oriented to person, place, and time.  He is uncomfortable due to his sore throat.  HENT:  Mouth/Throat: Oropharyngeal exudate present.  He has left-sided exudative pharyngitis and edema.  Eyes: Conjunctivae are normal.  Cardiovascular: Normal rate and regular rhythm.   No murmur heard. Pulmonary/Chest: Effort normal and breath sounds normal. He has no wheezes. He has no rales.  Abdominal: Soft. He exhibits no mass. There is no tenderness.  Musculoskeletal: Normal range of motion.  Lymphadenopathy:       Head (left side): Submandibular adenopathy present.  Neurological: He is alert and  oriented to person, place, and time.  Skin: No rash noted.  Psychiatric: Mood and affect normal.    Lab Results Lab Results  Component Value Date   WBC 7.3 07/22/2016   HGB 15.8 07/22/2016   HCT 45.7 07/22/2016   MCV 94.0 07/22/2016   PLT 226 07/22/2016    Lab Results  Component Value Date   CREATININE 1.26 07/22/2016   BUN 13 07/22/2016   NA 138 07/22/2016   K 4.7 07/22/2016   CL 102 07/22/2016   CO2 29 07/22/2016    Lab Results  Component Value Date   ALT 14 07/22/2016   AST 16 07/22/2016   ALKPHOS 91 07/22/2016   BILITOT 0.6 07/22/2016    Lab Results  Component Value Date   CHOL 207 (H) 11/02/2015   HDL 29 (L) 11/02/2015   LDLCALC 136 (H) 11/02/2015   TRIG 212 (H) 11/02/2015   CHOLHDL 7.1 (H) 11/02/2015   HIV 1 RNA Quant (copies/mL)  Date Value  07/22/2016 22 (H)  12/29/2015 66 (H)  09/08/2015 51 (H)   CD4 T Cell Abs (/uL)  Date Value  07/22/2016 390 (L)  12/29/2015 240 (L)  09/08/2015 210 (L)     Problem List Items Addressed This Visit      High   Human immunodeficiency virus (HIV) disease (HCC)    His infection has come under much better control and he is having CD4 reconstitution since restarting therapy 2 years ago. He will continue Descovy and Prezcobix and get repeat lab work today. He will follow-up in 3 months.      Relevant Orders   T-helper cell (CD4)- ([facility] clinic only)   HIV 1 RNA quant-no reflex-bld     Medium   Anxiety state    Overall his anxiety and depression are better but he is still somewhat tenuous. We introduced him to our new behavioral health counselor, Vergia AlbertsSherry Royster.        Unprioritized   Pharyngitis    I will start him on amoxicillin.      Relevant Medications   amoxicillin (AMOXIL) 500 MG capsule        Cliffton AstersJohn Hommer Cunliffe, MD [facility] [phone] pager   [phone] cell 11/15/2016, 10:49 AM

## 2016-11-16 LAB — T-HELPER CELL (CD4) - (RCID CLINIC ONLY)
CD4 % Helper T Cell: 9 % — ABNORMAL LOW (ref 33–55)
CD4 T CELL ABS: 130 /uL — AB (ref 400–2700)

## 2016-11-17 LAB — HIV-1 RNA QUANT-NO REFLEX-BLD
HIV 1 RNA QUANT: 258000 {copies}/mL — AB
HIV-1 RNA Quant, Log: 5.41 Log copies/mL — ABNORMAL HIGH

## 2016-11-21 ENCOUNTER — Ambulatory Visit: Payer: Medicare Other | Admitting: Licensed Clinical Social Worker

## 2016-11-23 ENCOUNTER — Encounter: Payer: Self-pay | Admitting: Internal Medicine

## 2016-11-23 ENCOUNTER — Telehealth: Payer: Self-pay | Admitting: Pharmacist Clinician (PhC)/ Clinical Pharmacy Specialist

## 2016-11-23 NOTE — Telephone Encounter (Signed)
Probably falling off the wagon again. VL is up. He will come in on Monday to meet with pharmacy

## 2016-11-24 ENCOUNTER — Telehealth: Payer: Self-pay | Admitting: Licensed Clinical Social Worker

## 2016-11-24 NOTE — Telephone Encounter (Signed)
  Attempted to contact patient due to missed appointment on 6/4.  Unable to leave voice mail because system was not set up yet.  No warm hand off was completed and [facility].  Vergia AlbertsSherry Lovinia Snare, [facility]

## 2016-11-28 ENCOUNTER — Ambulatory Visit: Payer: Medicare Other

## 2016-11-30 NOTE — Progress Notes (Signed)
Name: Jordan Calderon MRN: [id number]  Date: 11/15/16  Time started: 10:35 am    Time ended: 10:55 am  Behavior: Psychomotor Agitation and Physical Restlessness; Cooperative Mood: Anxious Affect: Abnormal Thought Process: Coherent Thought Content: Logical Thought Disorder and Perceptual Anomalies: None observed or self-reported S/I and H/I: None observed  Referral Source: Dr. Megan Salon  Content of Session: Met with patient based on referral, due to history of depression/anxiety and psychotropic medications.  Intervention Provided: Gathered psychotropic medications and assessed for efficacy and compliance. Assessed current use of coping strategies to manage symptoms.  Educated on mental distraction techniques and practiced in session. Gave homework assignment to utilize techniques outside of session.  Response: Patient reported being prescribed Lorazepam and Paxil from Dr. Megan Salon and reported daily medication compliance.  For the Paxil, patient reported efficacy in improving mood because he is "not crying anymore" and reported that the Lorazepam is "mellowing me out".  Patient reported experience of anxiety due to ruminations of the past and what was "done to me".  Patient was able to list the coping strategies taught by the previous provider however reported that he did not have time to practice the strategies.  Patient denied current management of symptoms and also reported experience of physical pain due to ear/throat infection. At times throughout the session the patient grabbed his ear or throat and presented in pain.  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  Patient was receptive to his attention being drawn to the present while working with little time to ruminate on the past.  Patient was receptive to the idea of mental distraction in decreasing rumination and attempting to  stay in the present moment.  Patient was receptive to learning about and utilizing 5 Things See/Hear/Feel.  Patient was able to engage in activity and was able to complete all five.  Patient was asked to become more aware of when he is ruminating and to use the technique to draw his thoughts back to the current.  Plan: Patient will begin to develop awareness of his thoughts and use 5 Things See/Hear/Feel as appropriate.  Patient will schedule appointment and [facility] will continue to educate on mindfulness and grounding techniques.  Next appointment is scheduled for Monday June 4th at 9:45 am.  Sande Rives, [facility]

## 2016-12-01 ENCOUNTER — Encounter (INDEPENDENT_AMBULATORY_CARE_PROVIDER_SITE_OTHER): Payer: Medicare Other | Admitting: *Deleted

## 2016-12-01 ENCOUNTER — Ambulatory Visit (INDEPENDENT_AMBULATORY_CARE_PROVIDER_SITE_OTHER): Payer: Medicare Other | Admitting: Internal Medicine

## 2016-12-01 ENCOUNTER — Ambulatory Visit (INDEPENDENT_AMBULATORY_CARE_PROVIDER_SITE_OTHER): Payer: Medicare Other | Admitting: Pharmacist Clinician (PhC)/ Clinical Pharmacy Specialist

## 2016-12-01 VITALS — BP 120/84 | HR 73 | Temp 97.9°F | Wt 163.5 lb

## 2016-12-01 DIAGNOSIS — F1910 Other psychoactive substance abuse, uncomplicated: Secondary | ICD-10-CM | POA: Diagnosis not present

## 2016-12-01 DIAGNOSIS — F331 Major depressive disorder, recurrent, moderate: Secondary | ICD-10-CM | POA: Diagnosis not present

## 2016-12-01 DIAGNOSIS — Z006 Encounter for examination for normal comparison and control in clinical research program: Secondary | ICD-10-CM

## 2016-12-01 DIAGNOSIS — B20 Human immunodeficiency virus [HIV] disease: Secondary | ICD-10-CM

## 2016-12-01 LAB — COMPREHENSIVE METABOLIC PANEL
ALK PHOS: 75 U/L (ref 40–115)
ALT: 15 U/L (ref 9–46)
AST: 14 U/L (ref 10–35)
Albumin: 3.4 g/dL — ABNORMAL LOW (ref 3.6–5.1)
BILIRUBIN TOTAL: 0.3 mg/dL (ref 0.2–1.2)
BUN: 11 mg/dL (ref 7–25)
CO2: 25 mmol/L (ref 20–31)
CREATININE: 0.94 mg/dL (ref 0.70–1.33)
Calcium: 8.2 mg/dL — ABNORMAL LOW (ref 8.6–10.3)
Chloride: 105 mmol/L (ref 98–110)
Glucose, Bld: 85 mg/dL (ref 65–99)
Potassium: 3.6 mmol/L (ref 3.5–5.3)
SODIUM: 139 mmol/L (ref 135–146)
TOTAL PROTEIN: 6.1 g/dL (ref 6.1–8.1)

## 2016-12-01 NOTE — Assessment & Plan Note (Signed)
His depression and anxiety are worse coincident with crack cocaine use and being off of his HIV therapy. I have arranged a visit with our behavioral health counselor next week.

## 2016-12-01 NOTE — Progress Notes (Signed)
Jordan Calderon is here for month 12 Reprieve visit, A Randomized Trial to Prevent Vascular Events in HIV. He was very nervous/anxious and tearful at times today. Stated that he has been "hanging around with the wrong people". Verbalized thoughts of self harm but stated that he would "never" go through with it because of his dogs. Had him him meet with Vergia AlbertsSherry Royster and notified Dr.Campbell of comments/situation. Seen and evaluated by Dr. Orvan Falconerampbell as well today. States that he ran out of his study medicine about 2 weeks ago. Has not been taking his HIV medication. Unable to give a clear reason as to why he stopped his medication. He did meet with pharmacy and agreed to restart his medicine today. Will follow up with Minh in 2 weeks. Next study visit scheduled for September.

## 2016-12-01 NOTE — Assessment & Plan Note (Signed)
His infection is out of control after stopping antiretroviral therapy 2 months ago. He does seem motivated to restart. I had him meet with our ID pharmacist, Ulyses SouthwardMinh Pham today. He will follow-up with him in 2 weeks and return to see me after lab work in 6 weeks.

## 2016-12-01 NOTE — Progress Notes (Signed)
HPI: Jordan Calderon is a 56 y.o. male who is here to see research and pharmacy for his compliance again.   Allergies: Allergies  Allergen Reactions  . Sulfonamide Derivatives     REACTION: itching    Vitals: Temp: 97.9 F (36.6 C) (06/14 1155) Temp Source: Oral (06/14 1155) BP: 120/84 (06/14 1155) Pulse Rate: 73 (06/14 1155)  Past Medical History: Past Medical History:  Diagnosis Date  . Anxiety   . Cellulitis 02/11/2014   LEFT ARM  . Depression   . Headache(784.0)   . HIV (human immunodeficiency virus infection) (HCC)     Social History: Social History   Social History  . Marital status: Single    Spouse name: N/A  . Number of children: N/A  . Years of education: N/A   Social History Main Topics  . Smoking status: Former Smoker    Packs/day: 0.10    Years: 30.00    Types: Cigarettes    Quit date: 11/19/2014  . Smokeless tobacco: Never Used  . Alcohol use No  . Drug use: No  . Sexual activity: Not on file   Other Topics Concern  . Not on file   Social History Narrative  . No narrative on file    Previous Regimen:  Current Regimen: Prescobix/Descovy  Labs: HIV 1 RNA Quant (copies/mL)  Date Value  11/15/2016 258,000 (H)  07/22/2016 22 (H)  12/29/2015 66 (H)   CD4 T Cell Abs (/uL)  Date Value  11/15/2016 130 (L)  07/22/2016 390 (L)  12/29/2015 240 (L)   Hep B S Ab (no units)  Date Value  08/14/2006 No   Hepatitis B Surface Ag (no units)  Date Value  08/14/2006 No   HCV Ab (no units)  Date Value  08/14/2006 No    CrCl: CrCl cannot be calculated (Patient's most recent lab result is older than the maximum 21 days allowed.).  Lipids:    Component Value Date/Time   CHOL 207 (H) 11/02/2015 1300   TRIG 212 (H) 11/02/2015 1300   HDL 29 (L) 11/02/2015 1300   CHOLHDL 7.1 (H) 11/02/2015 1300   VLDL 42 (H) 11/02/2015 1300   LDLCALC 136 (H) 11/02/2015 1300    Assessment: Jordan Calderon fell of the wagon again on his ART. He stopped it about 2  months ago after hanging out with the wrong crowd. There was no good reason why he stopped. Jordan Calderon (research) pulled me in to talk to him about his issue. He has had a long history of depression and anxiety. I remembered seeing him all the way back in 2015. He said that he is more motivated now to restart his therapy and stop hanging out with the people with the bad influence. I gave several example of people stopped taking their meds and the consequences that they are facing now. He is coming back to see me in about 3 wks to check on his progress.   Recommendations:  Restart Prezcobix/Descovy Repeat labs at the next visit  Ulyses SouthwardMinh Carreen Calderon, PharmD, BCPS, AAHIVP, CPP Clinical Infectious Disease Pharmacist [facility] 12/01/2016, 10:47 PM

## 2016-12-01 NOTE — Assessment & Plan Note (Signed)
He had a relapse of his drug use after being clean and sober for many years. He is agreeable to entering into counseling. He is very afraid that his sister, Jordan Calderon, might find out. He believes that if she was aware of his recent drug use she would kick him out of his house and he has 4 dogs would be homeless.

## 2016-12-01 NOTE — Progress Notes (Signed)
Patient Active Problem List   Diagnosis Date Noted  . Human immunodeficiency virus (HIV) disease (HCC) 04/06/2006    Priority: High  . Former cigarette smoker 07/29/2008    Priority: Medium  . Anxiety state 04/06/2006    Priority: Medium  . Polysubstance abuse 04/06/2006    Priority: Medium  . Depression 04/06/2006    Priority: Medium  . Hepatitis C 04/06/2006    Priority: Low  . Pharyngitis 11/15/2016  . DENTAL CARIES 04/06/2006  . ANAL FISSURE 04/06/2006    Patient's Medications  New Prescriptions   No medications on file  Previous Medications   AMOXICILLIN (AMOXIL) 500 MG CAPSULE    Take 1 capsule (500 mg total) by mouth 2 (two) times daily.   DESCOVY 200-25 MG TABLET    TAKE 1 TABLET BY MOUTH DAILY   LORAZEPAM (ATIVAN) 2 MG TABLET    Take 1 tablet (2 mg total) by mouth 2 (two) times daily as needed. for anxiety   PAROXETINE (PAXIL) 40 MG TABLET    TAKE 1 TABLET BY MOUTH EVERY DAY   PITAVASTATIN CALCIUM 4 MG TABS    Take 1 tablet (4 mg total) by mouth daily. Study provided. May be placebo. DO NOT FILL   PREZCOBIX 800-150 MG TABLET    TAKE 1 TABLET BY MOUTH EVERY DAY WITH FOOD. SWALLOW WHOLE. DO NOT CRUSH, BREAK, OR CHEW TABLET  Modified Medications   No medications on file  Discontinued Medications   No medications on file    Subjective: Jordan Calderon is seen on a work in basis. When he was in to see me recently he indicated that everything was going well but in fact he has been off of his HIV medication for the past 2 months. He states that he wanted companionship and started to hang out with some people and now realizes he was hanging out with the "wrong crowd". He started using crack cocaine about once each week. He says that he never bought it but that his friends would give it to him. He says he is not sure why he stopped taking his medication. He did continue his Paxil and Ativan. He has been more depressed recently. He last used cocaine about 2 weeks ago. He tells  me that he did not like how it made him feel and he grew increasingly uncomfortable hanging out with those people. He decided to stop answering their phone calls. He came in for a research visit today and was directed to see me.  Review of Systems: Review of Systems  Constitutional: Positive for malaise/fatigue. Negative for chills, diaphoresis, fever and weight loss.  HENT: Negative for sore throat.   Respiratory: Negative for cough, sputum production and shortness of breath.   Cardiovascular: Negative for chest pain.  Gastrointestinal: Negative for abdominal pain, diarrhea, heartburn, nausea and vomiting.  Genitourinary: Negative for dysuria and frequency.  Musculoskeletal: Negative for joint pain and myalgias.  Skin: Positive for itching. Negative for rash.  Neurological: Negative for dizziness and headaches.  Psychiatric/Behavioral: Positive for depression and substance abuse. The patient is nervous/anxious.     Past Medical History:  Diagnosis Date  . Anxiety   . Cellulitis 02/11/2014   LEFT ARM  . Depression   . Headache(784.0)   . HIV (human immunodeficiency virus infection) (HCC)     Social History  Substance Use Topics  . Smoking status: Former Smoker    Packs/day: 0.10    Years: 30.00  Types: Cigarettes    Quit date: 11/19/2014  . Smokeless tobacco: Never Used  . Alcohol use No    Family History  Problem Relation Age of Onset  . Adopted: Yes    Allergies  Allergen Reactions  . Sulfonamide Derivatives     REACTION: itching    Objective:  There were no vitals filed for this visit. There is no height or weight on file to calculate BMI.  Physical Exam  Constitutional: He is oriented to person, place, and time.  He is upset with himself.  HENT:  Mouth/Throat: No oropharyngeal exudate.  Eyes: Conjunctivae are normal.  Cardiovascular: Normal rate and regular rhythm.   No murmur heard. Pulmonary/Chest: Breath sounds normal.  Abdominal: Soft. He exhibits  no mass. There is no tenderness.  Musculoskeletal: Normal range of motion.  Neurological: He is alert and oriented to person, place, and time.  Skin: No rash noted.  Psychiatric:  Flat affect.    Lab Results Lab Results  Component Value Date   WBC 7.3 07/22/2016   HGB 15.8 07/22/2016   HCT 45.7 07/22/2016   MCV 94.0 07/22/2016   PLT 226 07/22/2016    Lab Results  Component Value Date   CREATININE 1.26 07/22/2016   BUN 13 07/22/2016   NA 138 07/22/2016   K 4.7 07/22/2016   CL 102 07/22/2016   CO2 29 07/22/2016    Lab Results  Component Value Date   ALT 14 07/22/2016   AST 16 07/22/2016   ALKPHOS 91 07/22/2016   BILITOT 0.6 07/22/2016    Lab Results  Component Value Date   CHOL 207 (H) 11/02/2015   HDL 29 (L) 11/02/2015   LDLCALC 136 (H) 11/02/2015   TRIG 212 (H) 11/02/2015   CHOLHDL 7.1 (H) 11/02/2015   HIV 1 RNA Quant (copies/mL)  Date Value  11/15/2016 258,000 (H)  07/22/2016 22 (H)  12/29/2015 66 (H)   CD4 T Cell Abs (/uL)  Date Value  11/15/2016 130 (L)  07/22/2016 390 (L)  12/29/2015 240 (L)     Problem List Items Addressed This Visit      High   Human immunodeficiency virus (HIV) disease (HCC)    His infection is out of control after stopping antiretroviral therapy 2 months ago. He does seem motivated to restart. I had him meet with our ID pharmacist, Ulyses SouthwardMinh Pham today. He will follow-up with him in 2 weeks and return to see me after lab work in 6 weeks.      Relevant Orders   T-helper cell (CD4)- ([facility] only)   HIV 1 RNA quant-no reflex-bld   RPR     Medium   Depression    His depression and anxiety are worse coincident with crack cocaine use and being off of his HIV therapy. I have arranged a visit with our behavioral health counselor next week.      Polysubstance abuse    He had a relapse of his drug use after being clean and sober for many years. He is agreeable to entering into counseling. He is very afraid that his sister, Jordan Calderon,  might find out. He believes that if she was aware of his recent drug use she would kick him out of his house and he has 4 dogs would be homeless.           Jordan AstersJohn Zolton Dowson, MD [facility] [phone] pager   [phone] cell 12/01/2016, 1:36 PM

## 2016-12-08 ENCOUNTER — Ambulatory Visit (INDEPENDENT_AMBULATORY_CARE_PROVIDER_SITE_OTHER): Payer: Medicare Other | Admitting: Licensed Clinical Social Worker

## 2016-12-08 DIAGNOSIS — F411 Generalized anxiety disorder: Secondary | ICD-10-CM

## 2016-12-08 NOTE — Progress Notes (Signed)
Integrated Behavioral Health Follow Up Visit  MRN: [id number] Name: Jordan Calderon   Session Start time: 8:45 am Session End time: 9:45 am Total time: 1 hour Number of Integrated Behavioral Health Clinician visits: 1/10  Type of Service: Integrated Behavioral Health- Individual/Family Interpretor:No. Interpretor Name and Language: N/A   Warm Hand Off Completed.       SUBJECTIVE: Jordan Parentsodd C [place] is a 56 y.o. male accompanied by patient. Patient was referred by Dr. Orvan Falconerampbell  for anxiety symptoms. Patient reported that his thoughts are focused in the past, about things that happened to him when he was younger.  Patient was receptive to not exploring those traumatic experiences but learning coping strategies to manage anxiety.  Patient shared his family history and reported that his father was in the [employer] and that his mother worked for the [employer] as a Diplomatic Services operational officersecretary.  Patient informed that his parents divorced at age 89 and his mother had primary custody, although he spent the summers with his father.  Patient also shared that he was adopted by his parents at 9 months, his parents are currently deceased, and he currently has a strained relationship with his siblings.   Patient was receptive to exploring his family socialization history and early experiences with anxiety.  Patient reported that his mother never showed signs of stress or verbalized stress and that his father was big on order and structure and never showed his emotions.  In managing current anxiety, the patient identified with a tendency to ignore the physical and psychological signs of stress.  Patient was receptive to learning how to conduct a body scan to determine how stress and anxiety is stored in his body and verbalized current stress stored as leg shaking and increased heart rate.  Patient was also receptive to other physical management strategies of grounding in the present.  OBJECTIVE: Mood: Anxious and Affect:  Appropriate Risk of harm to self or others: No plan to harm self or others  Thought process: circumstancial Thought content: logical   GOALS ADDRESSED: Patient will reduce symptoms of: anxiety and ruminations about the past and increase knowledge and/or ability of: coping skills, healthy habits and stress reduction and also: Increase healthy adjustment to current life circumstances  INTERVENTIONS: Solution-Focused Strategies and Psychoeducation and/or Health Education Educated patient on conducting body scan to identify stress.  Reviewed physical soothing activities (i.e. Rocking chair). Introduced Magazine features editorgrounding technique of holding a cold, hard rock.  ASSESSMENT: Patient has difficulty managing traumatic thoughts, which causes anxiety.  Due to anxiety, patient has difficulty with maintaining in the present moment and being in touch with his body.  Patient may benefit from continued education on physical and lifestyle strategies, cognitive strategies, emotional strategies, and spiritual strategies.  PLAN: 1. Follow up with behavioral health clinician on : Thursday June 28th at 8:45 am 2. Behavioral recommendations: Continue to use body scanning, continue to use 5 things see/hear/feel, continue to use hard cold object to ground to the present.  Vergia AlbertsSherry Tierra Divelbiss, [facility]

## 2016-12-12 ENCOUNTER — Other Ambulatory Visit: Payer: Self-pay | Admitting: *Deleted

## 2016-12-12 DIAGNOSIS — B20 Human immunodeficiency virus [HIV] disease: Secondary | ICD-10-CM

## 2016-12-15 ENCOUNTER — Ambulatory Visit (INDEPENDENT_AMBULATORY_CARE_PROVIDER_SITE_OTHER): Payer: Medicare Other | Admitting: Licensed Clinical Social Worker

## 2016-12-15 ENCOUNTER — Other Ambulatory Visit: Payer: Self-pay | Admitting: Internal Medicine

## 2016-12-15 DIAGNOSIS — F4310 Post-traumatic stress disorder, unspecified: Secondary | ICD-10-CM

## 2016-12-15 DIAGNOSIS — F411 Generalized anxiety disorder: Secondary | ICD-10-CM

## 2016-12-16 NOTE — Progress Notes (Signed)
Integrated Behavioral Health Follow Up Visit  MRN: [id number] Name: Jordan Calderon   Session Start time: 9:05 am Session End time: 10:05 am Total time: 1 hour Number of Integrated Behavioral Health Clinician visits: 2/10  Type of Service: Integrated Behavioral Health- Individual/Family Interpretor:No. Interpretor Name and Language: N/A   Warm Hand Off Completed.       SUBJECTIVE: Jordan Parentsodd C [place] is a 56 y.o. male accompanied by patient. Patient was referred by Dr. Orvan Falconerampbell for anxiety symptoms.  Patient was educated on physical and lifestyle strategies to decrease levels of anxiety.  The patient was taught progressive muscle relaxation and was able to process the relaxation strategies that he used to do.  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  Patient was able to process other physical and lifestyle strategies of diet and exercise, planning down time and fitting in mini-breaks at work, time management and seeking balance, and choosing a non-toxic environment.  Patient was receptive to increasing awareness of the need for taking bathroom breaks while working to care for self and telling his co-workers "NO" when they offer him Alcohol.  Patient was also educated on physical grounding techniques and was receptive to using to assist in focusing on the present and not on his internal thoughts that are focused on the past.   OBJECTIVE: Mood: Anxious and Affect: Appropriate Risk of harm to self or others: No plan to harm self or others  Thought process: conherent Thought content: logical   GOALS ADDRESSED: Patient will reduce symptoms of: anxiety and increase knowledge and/or ability of: coping skills and stress reduction and also: Increase healthy adjustment to current life circumstances  INTERVENTIONS: Solution-Focused Strategies and Mindfulness or Relaxation Training Educated patient on physical and  lifestyle coping strategies and reviewed additional physical grounding techniques to assist with maintaining focus on the present.  ASSESSMENT: Patient currently experiencing anxiety and may benefit from continued education on coping strategies.  PLAN: 1. Follow up with behavioral health clinician on : Thursday July 5th at 10:15 am 2. Behavioral recommendations: Continue to practice learned coping strategies outside of session. 3. At next session educate patient on emotional, cognitive, and spiritual coping strategies.  Vergia AlbertsSherry Breonia Kirstein, [facility]

## 2016-12-22 ENCOUNTER — Ambulatory Visit (INDEPENDENT_AMBULATORY_CARE_PROVIDER_SITE_OTHER): Payer: Medicare Other | Admitting: Licensed Clinical Social Worker

## 2016-12-22 ENCOUNTER — Ambulatory Visit (INDEPENDENT_AMBULATORY_CARE_PROVIDER_SITE_OTHER): Payer: Medicare Other | Admitting: Pharmacist Clinician (PhC)/ Clinical Pharmacy Specialist

## 2016-12-22 DIAGNOSIS — F411 Generalized anxiety disorder: Secondary | ICD-10-CM

## 2016-12-22 DIAGNOSIS — B20 Human immunodeficiency virus [HIV] disease: Secondary | ICD-10-CM

## 2016-12-22 NOTE — Progress Notes (Signed)
HPI: Jordan Calderon is a 56 y.o. male who is here for his adherence check up.   Allergies: Allergies  Allergen Reactions  . Sulfonamide Derivatives     REACTION: itching    Vitals:    Past Medical History: Past Medical History:  Diagnosis Date  . Anxiety   . Cellulitis 02/11/2014   LEFT ARM  . Depression   . Headache(784.0)   . HIV (human immunodeficiency virus infection) (HCC)     Social History: Social History   Social History  . Marital status: Single    Spouse name: N/A  . Number of children: N/A  . Years of education: N/A   Social History Main Topics  . Smoking status: Former Smoker    Packs/day: 0.10    Years: 30.00    Types: Cigarettes    Quit date: 11/19/2014  . Smokeless tobacco: Never Used  . Alcohol use No  . Drug use: No  . Sexual activity: Not on file   Other Topics Concern  . Not on file   Social History Narrative  . No narrative on file    Previous Regimen:  Current Regimen: Prezcobix  Labs: HIV 1 RNA Quant (copies/mL)  Date Value  11/15/2016 258,000 (H)  07/22/2016 22 (H)  12/29/2015 66 (H)   CD4 T Cell Abs (/uL)  Date Value  11/15/2016 130 (L)  07/22/2016 390 (L)  12/29/2015 240 (L)   Hep B S Ab (no units)  Date Value  08/14/2006 No   Hepatitis B Surface Ag (no units)  Date Value  08/14/2006 No   HCV Ab (no units)  Date Value  08/14/2006 No    CrCl: CrCl cannot be calculated (Unknown ideal weight.).  Lipids:    Component Value Date/Time   CHOL 207 (H) 11/02/2015 1300   TRIG 212 (H) 11/02/2015 1300   HDL 29 (L) 11/02/2015 1300   CHOLHDL 7.1 (H) 11/02/2015 1300   VLDL 42 (H) 11/02/2015 1300   LDLCALC 136 (H) 11/02/2015 1300    Assessment:  Jordan Calderon is here for his compliance check because he recently stop therapy due to hanging around the wrong crowd. He restarted his therapy after the visit with me in June without missing a dose since. He brought all of his meds to the visit today to show me. He stopped hanging  out with the bad crowd now. Encourage him to continue to be good with taking his medications. Mentally, he is in a much better state. He'll also meet with Cordelia PenSherry today for counseling. Scheduled him to come back for labs in a few weeks. He'll f/u with Dr. Orvan Falconerampbell in September.   Recommendations:  Cont Prezcobix/Descovy Labs on 7/26 F/u with Dr. Orvan Falconerampbell in Sept  Minh Pham, PharmD, BCPS, AAHIVP, CPP Clinical Infectious Disease Pharmacist [facility] 12/22/2016, 9:28 AM

## 2016-12-22 NOTE — Patient Instructions (Addendum)
Continue your Prezcobix and Descovy Come back for labs on 7/26 Follow up with Dr. Orvan Falconerampbell 9/4

## 2016-12-22 NOTE — Progress Notes (Signed)
Integrated Behavioral Health Follow Up Visit  MRN: [id number] Name: Ellis Parentsodd C Derk   Session Start time: 10:16 am Session End time: 11:16 am Total time: 1 hour Number of Integrated Behavioral Health Clinician visits: 3/10  Type of Service: Integrated Behavioral Health- Individual/Family Interpretor:No. Interpretor Name and Language: N/A   Warm Hand Off Completed.       SUBJECTIVE: Ellis Parentsodd C Regula is a 56 y.o. male accompanied by patient. Patient was referred by Dr. Orvan Falconerampbell for anxiety symptoms.  Patient was active and engaged while exploring cognitive and emotional strategies to assist in the management of anxiety.  Patient was able to review increasing frustration tolerance and the connection to being tolerant of distressful emotions.  Patient reported that he has been increasing in the use of this technique, although he has called it decreasing "wasteful anger".  Patient was also able to review rational responses to emotionally charged situations and reviewed his personal recent stressful, angry situations.  The patient presented with insight into this thinking patterns.  Patient did not appear receptive to addressing upcoming [facility].  The patient presented as reluctant to continue therapeutic benefits with community mental health providers.  However patient was receptive to analogy of the baby bird leaving the nest using new wings, which would be his use of the newly learned coping strategies that he has learned.  OBJECTIVE: Mood: Anxious and Affect: within range Risk of harm to self or others: No plan to harm self or others   GOALS ADDRESSED: Patient will reduce symptoms of: anxiety and increase knowledge and/or ability of: coping skills and self-management skills.    INTERVENTIONS: Solution-Focused Strategies  ASSESSMENT: Patient currently experiencing symptoms of anxiety and may continue to benefit from mental health  counseling.  PLAN: 1. Follow up with behavioral health clinician on : Thursday July 12th at 8:45 am 2. Behavioral recommendations: Continue to practice learned coping strategies outside of session. 3. At next session continue to educate patient on emotional, cognitive, and spiritual coping strategies.   Vergia AlbertsSherry Jaiona Simien, [facility]

## 2016-12-29 ENCOUNTER — Ambulatory Visit (INDEPENDENT_AMBULATORY_CARE_PROVIDER_SITE_OTHER): Payer: Medicare Other | Admitting: Licensed Clinical Social Worker

## 2016-12-29 DIAGNOSIS — F411 Generalized anxiety disorder: Secondary | ICD-10-CM

## 2016-12-29 NOTE — Progress Notes (Signed)
Integrated Behavioral Health Follow Up Visit  MRN: [id number] Name: Jordan Calderon   Session Start time: 8:45 am Session End time: 9:40 am Total time: 55 minutes Number of Integrated Behavioral Health Clinician visits: 4/10  Type of Service: Integrated Behavioral Health- Individual/Family Interpretor:No. Interpretor Name and Language: N/A   Warm Hand Off Completed.       SUBJECTIVE: Jordan Calderon is a 56 y.o. male accompanied by patient. Patient was referred by Dr. Orvan Falconer for anxiety symptoms.  Patient was able to review his progress on using learned coping strategies, reporting that he has been using the strategies often.  Patient reported symptom reduction since beginning counseling and completed the GAD today, evidencing a decrease in anxiety.  The symptom the patient is struggling with the most is having trouble sitting still.  The patient was able to verbalize the strategies he is using currently, which is trying to be more aware of his body movements and leg shaking, and stopping the movement.  Patient was receptive to discussing non-pharmacotherapy ways to address physical restlessness.  Patient was active in discussing ways to expend energy through dance and movement.  Patient reported having a dance background and was interested in participating in a dance class.  Patient and [facility] looked up activities at the neighborhood senior center and printed out information for an art and dance class at two different recreation centers.  Patient presented as excited for physical activity and social interaction.  Patient was also receptive to reviewing acceptance as a cognitive coping strategy for management of anxiety.  Patient was able to process the role of acceptance in worry and was provided with a hand-out on acceptance to read before the next session.  Patient requested assistance with learning how to say no to others at the next session, and was provided with a homework assignment to  make a list of the worst case scenario of saying no to others (customers at work and co-workers).  Patient presented as eager to begin the assignment and engage in behavior rehearsal activities.  Patient and [facility] reviewed termination of counseling relationship, with there being two additional sessions left.  Patient verbalized his displeasure with ending sessions but noted his growth and ability to use learned coping strategies.  [facility] educated patient that after the sixth session in the future he may benefit from booster mental health sessions to review coping strategies.   Severity of problem: mild  OBJECTIVE: Mood: Euthymic and Affect: Appropriate Risk of harm to self or others: No plan to harm self or others  Thought process: coherent Thought content: logical   GOALS ADDRESSED: Patient will reduce symptoms of: anxiety and increase knowledge and/or ability of: coping skills and also: Increase healthy adjustment to current life circumstances  INTERVENTIONS: Solution-Focused Strategies, Mindfulness or Relaxation Training and Link to [facility] Standardized Assessments completed: GAD-7   GAD 7 : Generalized Anxiety Score 12/29/2016  Nervous, Anxious, on Edge 0  Control/stop worrying 0  Worry too much - different things 1  Trouble relaxing 0  Restless 3  Easily annoyed or irritable 0  Afraid - awful might happen 1  Total GAD 7 Score 5  Anxiety Difficulty Not difficult at all     ASSESSMENT: Patient is currently experiencing a decrease in anxiety symptoms and may benefit from continued review of coping strategies, relaxation training, and mindfulness education.  PLAN: 1. Follow up with behavioral health clinician on : Thurs July 19th at 8:45 am 2. Behavioral recommendations: Continue to use learned  coping strategies outside of session 3. Referral(s): senior centers and recreation centers  4. At next session will review homework assignments of Acceptance hand-off and saying no  list.  Vergia AlbertsSherry Nathasha Fiorillo, [facility]PC

## 2017-01-05 ENCOUNTER — Ambulatory Visit: Payer: Medicare Other | Admitting: Licensed Clinical Social Worker

## 2017-01-06 ENCOUNTER — Telehealth: Payer: Self-pay | Admitting: *Deleted

## 2017-01-06 NOTE — Telephone Encounter (Signed)
Referral received during Viral load suppression meeting. Called made to the patient today who is happy to have my services. Home visit scheduled for next week.

## 2017-01-10 ENCOUNTER — Telehealth: Payer: Self-pay | Admitting: *Deleted

## 2017-01-10 ENCOUNTER — Other Ambulatory Visit: Payer: Medicare Other | Admitting: *Deleted

## 2017-01-10 NOTE — Telephone Encounter (Signed)
Contacted [name] to confirm our visit for today that was prearranged last week. Did not receive an answer or option for voicemail. Will wait and attempt call again

## 2017-01-10 NOTE — Telephone Encounter (Signed)
I have a planned visit with the patient today and prior to visit I spoke with Cordelia PenSherry to see if she could identify any possible safety concerns prior to my one on one home visit with the patient. Cordelia PenSherry stated the patient deals with more anxiety and coping with past traumas. She is not sure of the living situations but the threat of domestic violence has not been expressed.

## 2017-01-12 ENCOUNTER — Ambulatory Visit (INDEPENDENT_AMBULATORY_CARE_PROVIDER_SITE_OTHER): Payer: Medicare Other | Admitting: Licensed Clinical Social Worker

## 2017-01-12 ENCOUNTER — Other Ambulatory Visit: Payer: Medicare Other

## 2017-01-12 ENCOUNTER — Ambulatory Visit: Payer: Self-pay | Admitting: *Deleted

## 2017-01-12 DIAGNOSIS — F411 Generalized anxiety disorder: Secondary | ICD-10-CM

## 2017-01-12 DIAGNOSIS — F1910 Other psychoactive substance abuse, uncomplicated: Secondary | ICD-10-CM

## 2017-01-12 DIAGNOSIS — Z79899 Other long term (current) drug therapy: Secondary | ICD-10-CM

## 2017-01-12 DIAGNOSIS — B20 Human immunodeficiency virus [HIV] disease: Secondary | ICD-10-CM

## 2017-01-12 LAB — LIPID PANEL
CHOL/HDL RATIO: 6.7 ratio — AB (ref <5.0)
CHOLESTEROL: 207 mg/dL — AB (ref <200)
HDL: 31 mg/dL — ABNORMAL LOW (ref >40)
LDL Cholesterol: 145 mg/dL — ABNORMAL HIGH (ref <100)
Triglycerides: 157 mg/dL — ABNORMAL HIGH (ref <150)
VLDL: 31 mg/dL — ABNORMAL HIGH (ref <30)

## 2017-01-13 LAB — T-HELPER CELL (CD4) - (RCID CLINIC ONLY)
CD4 T CELL HELPER: 16 % — AB (ref 33–55)
CD4 T Cell Abs: 300 /uL — ABNORMAL LOW (ref 400–2700)

## 2017-01-13 LAB — RPR

## 2017-01-17 ENCOUNTER — Other Ambulatory Visit: Payer: Self-pay | Admitting: Internal Medicine

## 2017-01-17 DIAGNOSIS — F411 Generalized anxiety disorder: Secondary | ICD-10-CM

## 2017-01-17 LAB — HIV-1 RNA QUANT-NO REFLEX-BLD
HIV 1 RNA QUANT: 281 {copies}/mL — AB
HIV-1 RNA QUANT, LOG: 2.45 {Log_copies}/mL — AB

## 2017-01-17 NOTE — Progress Notes (Signed)
Integrated Behavioral Health Follow Up Visit  MRN: [id number] Name: Jordan Calderon   Session Start time: 10:16 am Session End time: 11:16 am Total time: 1 hour Number of Integrated Behavioral Health Clinician visits: 6/10  Type of Service: Integrated Behavioral Health- Individual/Family Interpretor:No. Interpretor Name and Language: N/A   Warm Hand Off Completed.       SUBJECTIVE: Jordan Calderon is a 56 y.o. male accompanied by patient. Patient was referred by Dr. Orvan Falconerampbell for anxiety symptoms.  [facility] reviewed his homework assignment of making a list of the Worst Case [place] in saying no to others.  Patient was able to process the list which included fear of losing friends and others judging him.  Patient presented as not focused during today's session and potentially impaired (under the influence).  When presented that something was different about today's session, the patient reported it was because he has not been sleeping well.  Patient reported last use of Alcohol was three weeks ago.  [facility] also processed the hand-out previously provided on acceptance.  The patient did not present as prepared to discuss the hand-out.  [facility] discussed his missed session last week and the patient reported it was because of lack of sleep because he ran out of Ativan prescription before the pharmacy would refill the 30 day supply.  Patient reported that his quality of sleep is poor and that he has been dreaming a lot.  Patient reported that he has been taking more than two Ativan pills a day, misusing the medication and has run out early.  Patient denied that he is abusing the medication or taking to sedate himself to get high.  As today is the sixth session, [facility] processed termination and the patient reported that he has increased anxiety and feels like he doesn't want to bond with someone else in counseling.  Patient was able to verbalize the coping strategies  that he learned and was receptive to using all tools in the toolbox.  To continue counseling, patient was provided with a [facility] [place] [facility] [place]  Patient was asked to walk-in for intake assessment tomorrow.  Patient was receptive.  Severity of problem: mild  OBJECTIVE: Mood: elevated and Affect: Labile Risk of harm to self or others: No plan to harm self or others  Thought process: tangential Thought content: logical  ASSESSMENT: Patient is currently experiencing a decrease in anxiety symptoms and may benefit from continuing mental health services from a community mental health provider.  GOALS ADDRESSED: Patient will reduce symptoms of: anxiety and increase knowledge and/or ability of: coping skills and also: Increase healthy adjustment to current life circumstances  INTERVENTIONS: Mindfulness or Relaxation Training and Supportive Counseling  PLAN: 1. No further sessions scheduled with the patient 2. Behavioral recommendations: Continue to use learned coping strategies outside of session. 3. Referral(s): ParamedicCommunity Mental Health Services (LME/Outside Clinic) and [place] [facility] [place]   [place] Chanelle Hodsdon, [place]

## 2017-02-03 NOTE — Progress Notes (Signed)
RN meet with Jordan Calderon and explained my role as part of the health care team that works out in [employer] for Dr. Orvan Falconer and the entire practice. My passion is to help him with medication adherence, support with life struggles and linkage to [facility].   Offered Jordan Calderon the opportuninty to focus on himself and his health. Jordan Calderon stated that would be nice since he is no longer seeing Cordelia Pen and would like a call after this week to schedule our appt

## 2017-02-17 ENCOUNTER — Other Ambulatory Visit: Payer: Self-pay | Admitting: *Deleted

## 2017-02-17 DIAGNOSIS — F411 Generalized anxiety disorder: Secondary | ICD-10-CM

## 2017-02-17 MED ORDER — LORAZEPAM 2 MG PO TABS: 2.0000 mg | ORAL_TABLET | Freq: Two times a day (BID) | ORAL | 1 refills | Status: DC | PRN

## 2017-02-21 ENCOUNTER — Ambulatory Visit: Payer: Medicare Other | Admitting: Internal Medicine

## 2017-02-28 ENCOUNTER — Other Ambulatory Visit: Payer: Medicare Other

## 2017-03-14 ENCOUNTER — Ambulatory Visit (INDEPENDENT_AMBULATORY_CARE_PROVIDER_SITE_OTHER): Payer: Medicare Other | Admitting: Internal Medicine

## 2017-03-14 ENCOUNTER — Encounter (INDEPENDENT_AMBULATORY_CARE_PROVIDER_SITE_OTHER): Payer: Medicare Other | Admitting: *Deleted

## 2017-03-14 ENCOUNTER — Encounter: Payer: Self-pay | Admitting: Internal Medicine

## 2017-03-14 VITALS — BP 123/86 | HR 82 | Temp 98.4°F | Wt 173.0 lb

## 2017-03-14 DIAGNOSIS — Z006 Encounter for examination for normal comparison and control in clinical research program: Secondary | ICD-10-CM

## 2017-03-14 DIAGNOSIS — Z23 Encounter for immunization: Secondary | ICD-10-CM

## 2017-03-14 DIAGNOSIS — B20 Human immunodeficiency virus [HIV] disease: Secondary | ICD-10-CM | POA: Diagnosis present

## 2017-03-14 DIAGNOSIS — F1910 Other psychoactive substance abuse, uncomplicated: Secondary | ICD-10-CM | POA: Diagnosis not present

## 2017-03-14 DIAGNOSIS — F330 Major depressive disorder, recurrent, mild: Secondary | ICD-10-CM | POA: Diagnosis not present

## 2017-03-14 NOTE — Assessment & Plan Note (Signed)
His infection is coming back under good control and he has had significant CD4 reconstitution since restarting antiretroviral therapy recently. I will repeat blood work today and have him follow-up in 3 months

## 2017-03-14 NOTE — Progress Notes (Signed)
Patient Active Problem List   Diagnosis Date Noted  . Human immunodeficiency virus (HIV) disease (HCC) 04/06/2006    Priority: High  . Former cigarette smoker 07/29/2008    Priority: Medium  . Anxiety state 04/06/2006    Priority: Medium  . Polysubstance abuse 04/06/2006    Priority: Medium  . Depression 04/06/2006    Priority: Medium  . Hepatitis C 04/06/2006    Priority: Low  . Pharyngitis 11/15/2016  . DENTAL CARIES 04/06/2006  . ANAL FISSURE 04/06/2006    Patient's Medications  New Prescriptions   No medications on file  Previous Medications   DESCOVY 200-25 MG TABLET    TAKE 1 TABLET BY MOUTH DAILY   LORAZEPAM (ATIVAN) 2 MG TABLET    Take 1 tablet (2 mg total) by mouth 2 (two) times daily as needed. for anxiety   PAROXETINE (PAXIL) 40 MG TABLET    TAKE 1 TABLET BY MOUTH EVERY DAY   PITAVASTATIN CALCIUM 4 MG TABS    Take 1 tablet (4 mg total) by mouth daily. Study provided. May be placebo. DO NOT FILL   PREZCOBIX 800-150 MG TABLET    TAKE 1 TABLET BY MOUTH EVERY DAY WITH FOOD. SWALLOW WHOLE. DO NOT CRUSH, BREAK, OR CHEW TABLET  Modified Medications   No medications on file  Discontinued Medications   No medications on file    Subjective: Jordan Calderon is in for his routine HIV follow-up visit. He has been back on his Descovy and Tivicay since his last visit. He has his calendar with him and has not missed any doses. He is not having any problems tolerating his medications. He has not used any methamphetamine or other illicit drugs since his last visit. He has been avoiding the in "friends" who he had been using drugs with. He says that his sister, [name], will always be overbearing but he tries to manage his conflict with her by keeping her occupied. She comes to his house once a month. His depression and anxiety are manageable with his paroxitine and lorazepam.  Review of Systems: Review of Systems  Constitutional: Negative for chills, diaphoresis, fever,  malaise/fatigue and weight loss.  HENT: Negative for sore throat.        No dental problems.  Respiratory: Negative for cough, sputum production and shortness of breath.   Cardiovascular: Negative for chest pain.  Gastrointestinal: Negative for abdominal pain, diarrhea, heartburn, nausea and vomiting.  Genitourinary: Negative for dysuria and frequency.  Musculoskeletal: Negative for joint pain and myalgias.  Skin: Negative for rash.  Neurological: Negative for dizziness and headaches.  Psychiatric/Behavioral: Positive for depression. Negative for substance abuse. The patient is nervous/anxious.     Past Medical History:  Diagnosis Date  . Anxiety   . Cellulitis 02/11/2014   LEFT ARM  . Depression   . Headache(784.0)   . HIV (human immunodeficiency virus infection) (HCC)     Social History  Substance Use Topics  . Smoking status: Former Smoker    Packs/day: 0.10    Years: 30.00    Types: Cigarettes    Quit date: 11/19/2014  . Smokeless tobacco: Never Used  . Alcohol use No    Family History  Problem Relation Age of Onset  . Adopted: Yes    Allergies  Allergen Reactions  . Sulfonamide Derivatives     REACTION: itching    Objective:  Vitals:   03/14/17 0910  BP: 123/86  Pulse: 82  Temp: 98.4  F (36.9 C)  TempSrc: Oral  Weight: 173 lb (78.5 kg)   Body mass index is 25.55 kg/m.  Physical Exam  Constitutional: He is oriented to person, place, and time.  He is in good spirits.  HENT:  Mouth/Throat: No oropharyngeal exudate.  His teeth are in good condition without any evidence of gum disease or cavities.  Eyes: Conjunctivae are normal.  Cardiovascular: Normal rate and regular rhythm.   No murmur heard. Pulmonary/Chest: Effort normal and breath sounds normal.  Abdominal: Soft. He exhibits no mass. There is no tenderness.  Musculoskeletal: Normal range of motion.  Neurological: He is alert and oriented to person, place, and time.  Skin: No rash noted.    Psychiatric: Mood and affect normal.    Lab Results Lab Results  Component Value Date   WBC 7.3 07/22/2016   HGB 15.8 07/22/2016   HCT 45.7 07/22/2016   MCV 94.0 07/22/2016   PLT 226 07/22/2016    Lab Results  Component Value Date   CREATININE 0.94 12/01/2016   BUN 11 12/01/2016   NA 139 12/01/2016   K 3.6 12/01/2016   CL 105 12/01/2016   CO2 25 12/01/2016    Lab Results  Component Value Date   ALT 15 12/01/2016   AST 14 12/01/2016   ALKPHOS 75 12/01/2016   BILITOT 0.3 12/01/2016    Lab Results  Component Value Date   CHOL 207 (H) 01/12/2017   HDL 31 (L) 01/12/2017   LDLCALC 145 (H) 01/12/2017   TRIG 157 (H) 01/12/2017   CHOLHDL 6.7 (H) 01/12/2017   Lab Results  Component Value Date   LABRPR NON REAC 01/12/2017   HIV 1 RNA Quant (copies/mL)  Date Value  01/12/2017 281 (H)  11/15/2016 258,000 (H)  07/22/2016 22 (H)   CD4 T Cell Abs (/uL)  Date Value  01/12/2017 300 (L)  11/15/2016 130 (L)  07/22/2016 390 (L)     Problem List Items Addressed This Visit      High   Human immunodeficiency virus (HIV) disease (HCC)    His infection is coming back under good control and he has had significant CD4 reconstitution since restarting antiretroviral therapy recently. I will repeat blood work today and have him follow-up in 3 months      Relevant Orders   T-helper cell (CD4)- ([facility] clinic only)   HIV 1 RNA quant-no reflex-bld     Medium   Depression    His chronic depression and anxiety are back at his normal baseline.      Polysubstance abuse    I congratulated him on being drug free since his last visit.           [name], MD [facility] [phone] pager   [phone] cell 03/14/2017, 9:47 AM

## 2017-03-14 NOTE — Assessment & Plan Note (Signed)
His chronic depression and anxiety are back at his normal baseline.

## 2017-03-14 NOTE — Assessment & Plan Note (Signed)
I congratulated him on being drug free since his last visit.

## 2017-03-14 NOTE — Progress Notes (Signed)
Jordan Calderon is here for month 16 Reprieve visit, A Randomized Trial to Prevent Vascular Events in HIV. Verbalized excellent adherence with his medications. No new complaints or concerns. Denies any muscle aches or weakness. Next visit scheduled for 06/27/17 at 10:00am.

## 2017-03-15 LAB — T-HELPER CELL (CD4) - (RCID CLINIC ONLY)
CD4 % Helper T Cell: 12 % — ABNORMAL LOW (ref 33–55)
CD4 T CELL ABS: 128 /uL — AB (ref 400–2700)

## 2017-03-16 LAB — HIV-1 RNA QUANT-NO REFLEX-BLD
HIV 1 RNA Quant: 36 copies/mL — ABNORMAL HIGH
HIV-1 RNA QUANT, LOG: 1.56 {Log_copies}/mL — AB

## 2017-03-28 ENCOUNTER — Telehealth: Payer: Self-pay | Admitting: *Deleted

## 2017-03-28 NOTE — Telephone Encounter (Signed)
RN reviewed recent HIV lab results with the patient.  Answered his questions.

## 2017-04-18 ENCOUNTER — Other Ambulatory Visit: Payer: Self-pay | Admitting: Internal Medicine

## 2017-04-18 DIAGNOSIS — F411 Generalized anxiety disorder: Secondary | ICD-10-CM

## 2017-04-19 ENCOUNTER — Other Ambulatory Visit: Payer: Self-pay | Admitting: *Deleted

## 2017-04-19 DIAGNOSIS — F411 Generalized anxiety disorder: Secondary | ICD-10-CM

## 2017-04-19 MED ORDER — LORAZEPAM 2 MG PO TABS: 2.0000 mg | ORAL_TABLET | Freq: Two times a day (BID) | ORAL | 1 refills | Status: DC | PRN

## 2017-06-04 ENCOUNTER — Other Ambulatory Visit: Payer: Self-pay | Admitting: Internal Medicine

## 2017-06-05 ENCOUNTER — Other Ambulatory Visit: Payer: Self-pay | Admitting: *Deleted

## 2017-06-05 DIAGNOSIS — B20 Human immunodeficiency virus [HIV] disease: Secondary | ICD-10-CM

## 2017-06-05 MED ORDER — EMTRICITABINE-TENOFOVIR AF 200-25 MG PO TABS: 1.0000 | ORAL_TABLET | Freq: Every day | ORAL | 5 refills | Status: DC

## 2017-06-05 MED ORDER — DARUNAVIR-COBICISTAT 800-150 MG PO TABS: ORAL_TABLET | ORAL | 5 refills | Status: DC

## 2017-06-12 ENCOUNTER — Other Ambulatory Visit: Payer: Self-pay | Admitting: Internal Medicine

## 2017-06-12 DIAGNOSIS — F4310 Post-traumatic stress disorder, unspecified: Secondary | ICD-10-CM

## 2017-06-15 ENCOUNTER — Other Ambulatory Visit: Payer: Self-pay | Admitting: Internal Medicine

## 2017-06-15 DIAGNOSIS — F411 Generalized anxiety disorder: Secondary | ICD-10-CM

## 2017-06-19 ENCOUNTER — Telehealth: Payer: Self-pay | Admitting: Infectious Disease

## 2017-06-19 NOTE — Telephone Encounter (Signed)
Pt called re his lorazepam script. There is one that was printed on 06/16/17. I am calling his pharmacy to see if they now have the script. He will run out tomorrow.  They did not have the script I called in script by voice and left 5 refills

## 2017-06-27 ENCOUNTER — Encounter: Payer: Self-pay | Admitting: Internal Medicine

## 2017-06-27 ENCOUNTER — Ambulatory Visit (INDEPENDENT_AMBULATORY_CARE_PROVIDER_SITE_OTHER): Payer: Medicare Other | Admitting: Pharmacist

## 2017-06-27 ENCOUNTER — Encounter (INDEPENDENT_AMBULATORY_CARE_PROVIDER_SITE_OTHER): Payer: Self-pay | Admitting: *Deleted

## 2017-06-27 ENCOUNTER — Ambulatory Visit (INDEPENDENT_AMBULATORY_CARE_PROVIDER_SITE_OTHER): Payer: Medicare Other | Admitting: Internal Medicine

## 2017-06-27 VITALS — BP 134/84 | HR 73 | Temp 97.5°F | Wt 180.0 lb

## 2017-06-27 VITALS — BP 147/90 | HR 69 | Temp 97.8°F | Ht 69.0 in | Wt 180.0 lb

## 2017-06-27 DIAGNOSIS — B20 Human immunodeficiency virus [HIV] disease: Secondary | ICD-10-CM | POA: Diagnosis present

## 2017-06-27 DIAGNOSIS — Z006 Encounter for examination for normal comparison and control in clinical research program: Secondary | ICD-10-CM

## 2017-06-27 MED ORDER — DARUN-COBIC-EMTRICIT-TENOFAF 800-150-200-10 MG PO TABS: 1.0000 | ORAL_TABLET | Freq: Every day | ORAL | 5 refills | Status: DC

## 2017-06-27 NOTE — Progress Notes (Signed)
Jordan Calderon is here for moth 20 Reprieve visit, A Randomized Trial to Prevent Vascular Events in HIV. Denies any muscle aches or weakness. Verbalized excellent adherence with Jordan Calderon medications. No new complaints or concerns. He will return in May for Jordan Calderon next study visit.

## 2017-06-27 NOTE — Progress Notes (Signed)
[facility] Visit  HPI: Jordan Calderon is a 57 y.o. male who presents to the [facility] clinic for HIV follow-up.  He was scheduled to see Dr. Orvan Falconer at one point but it was cancelled.  Dr. Orvan Falconer asked me to see patient instead.  Patient Active Problem List   Diagnosis Date Noted  . Pharyngitis 11/15/2016  . Former cigarette smoker 07/29/2008  . Human immunodeficiency virus (HIV) disease (HCC) 04/06/2006  . Hepatitis C 04/06/2006  . Anxiety state 04/06/2006  . Polysubstance abuse (HCC) 04/06/2006  . Depression 04/06/2006  . DENTAL CARIES 04/06/2006  . ANAL FISSURE 04/06/2006      Medication List        Accurate as of 06/27/17  4:32 PM. Always use your most recent med list.          Darunavir-Cobicisctat-Emtricitabine-Tenofovir Alafenamide 800-150-200-10 MG Tabs Commonly known as:  SYMTUZA Take 1 tablet by mouth daily with breakfast.   LORazepam 2 MG tablet Commonly known as:  ATIVAN TAKE 1 TABLET BY MOUTH TWICE DAILY AS NEEDED FOR ANXIETY   PARoxetine 40 MG tablet Commonly known as:  PAXIL TAKE 1 TABLET BY MOUTH EVERY DAY   Pitavastatin Calcium 4 MG Tabs Take 1 tablet (4 mg total) by mouth daily. Study provided. May be placebo. DO NOT FILL       Allergies: Allergies  Allergen Reactions  . Sulfonamide Derivatives     REACTION: itching    Past Medical History: Past Medical History:  Diagnosis Date  . Anxiety   . Cellulitis 02/11/2014   LEFT ARM  . Depression   . Headache(784.0)   . HIV (human immunodeficiency virus infection) (HCC)     Social History: Social History   Socioeconomic History  . Marital status: Single    Spouse name: Not on file  . Number of children: Not on file  . Years of education: Not on file  . Highest education level: Not on file  Social Needs  . Financial resource strain: Not on file  . Food insecurity - worry: Not on file  . Food insecurity - inability: Not on file  . Transportation needs  - medical: Not on file  . Transportation needs - non-medical: Not on file  Occupational History  . Not on file  Tobacco Use  . Smoking status: Former Smoker    Packs/day: 0.10    Years: 30.00    Pack years: 3.00    Types: Cigarettes    Last attempt to quit: 11/19/2014    Years since quitting: 2.6  . Smokeless tobacco: Never Used  Substance and Sexual Activity  . Alcohol use: No    Alcohol/week: 0.0 oz  . Drug use: No  . Sexual activity: Not on file  Other Topics Concern  . Not on file  Social History Narrative  . Not on file    Labs: HIV 1 RNA Quant (copies/mL)  Date Value  03/14/2017 36 (H)  01/12/2017 281 (H)  11/15/2016 258,000 (H)   CD4 T Cell Abs (/uL)  Date Value  03/14/2017 128 (L)  01/12/2017 300 (L)  11/15/2016 130 (L)   Hep B S Ab (no units)  Date Value  08/14/2006 No   Hepatitis B Surface Ag (no units)  Date Value  08/14/2006 No   HCV Ab (no units)  Date Value  08/14/2006 No    Lipids:    Component Value Date/Time   CHOL 207 (H) 01/12/2017 0933   TRIG 157 (H) 01/12/2017  0933   HDL 31 (L) 01/12/2017 0933   CHOLHDL 6.7 (H) 01/12/2017 0933   VLDL 31 (H) 01/12/2017 0933   LDLCALC 145 (H) 01/12/2017 0933    Current HIV Regimen: Prezcobix + Descovy  Assessment: Jordan Calderon was originally here to see Dr. Orvan Falconerampbell but saw pharmacy instead.  He is doing well on his Prezcobix and Descovy.  He brought all of his medications with him today and states he has missed no doses of any of them including his HIV medications since he last saw Dr. Orvan Falconerampbell in September. When asked if he takes the Prezcobix with food, he states "no it doesn't make me sick". I explained to him that it wasn't a nausea issue with the Prezcobix but it was an absorption issue and that it is absorbed better with food on his stomach.  He explained he did not know that and will now always take it with food.  Since he was on Prezcobix and Descovy and has had issues with adherence in the past, I  discussed changing him to [facility] today for less pill burden. He was very excited about this and wishes to change to this medication.  I explained Symtuza to him and that he still needed to take it with food every day and not miss doses.  He is tolerating the medication well right now so he should have no issues with Symtuza.  I will [facility] in to [facility] where [facility] gets his other medications filled. He will reschedule with Dr. Orvan Falconerampbell before leaving. I put in labs for him today as well.   Plan: - Stop Prezcobix and Descovy - Start Symtuza PO once daily with food - HIV RNA and CD4 today - F/u with Dr. Orvan Falconerampbell whenever possible  Sevanna Ballengee L. Anshu Wehner, PharmD, AAHIVP, CPP Infectious Diseases Clinical Pharmacist [facility] 06/27/2017, 4:32 PM

## 2017-06-28 LAB — T-HELPER CELL (CD4) - (RCID CLINIC ONLY)
CD4 T CELL ABS: 300 /uL — AB (ref 400–2700)
CD4 T CELL HELPER: 15 % — AB (ref 33–55)

## 2017-06-29 LAB — HIV-1 RNA QUANT-NO REFLEX-BLD
HIV 1 RNA QUANT: DETECTED {copies}/mL — AB
HIV-1 RNA Quant, Log: <1.3 Log copies/mL — AB

## 2017-07-05 ENCOUNTER — Ambulatory Visit: Payer: Medicare Other | Admitting: Internal Medicine

## 2017-07-13 ENCOUNTER — Other Ambulatory Visit: Payer: Self-pay | Admitting: Internal Medicine

## 2017-07-13 DIAGNOSIS — F4310 Post-traumatic stress disorder, unspecified: Secondary | ICD-10-CM

## 2017-07-18 ENCOUNTER — Other Ambulatory Visit: Payer: Self-pay | Admitting: Pharmacist

## 2017-07-18 ENCOUNTER — Ambulatory Visit (INDEPENDENT_AMBULATORY_CARE_PROVIDER_SITE_OTHER): Payer: Medicare Other | Admitting: Internal Medicine

## 2017-07-18 ENCOUNTER — Encounter: Payer: Self-pay | Admitting: Internal Medicine

## 2017-07-18 DIAGNOSIS — B20 Human immunodeficiency virus [HIV] disease: Secondary | ICD-10-CM

## 2017-07-18 DIAGNOSIS — F1910 Other psychoactive substance abuse, uncomplicated: Secondary | ICD-10-CM | POA: Diagnosis not present

## 2017-07-18 DIAGNOSIS — F411 Generalized anxiety disorder: Secondary | ICD-10-CM

## 2017-07-18 NOTE — Progress Notes (Signed)
Patient Active Problem List   Diagnosis Date Noted  . Human immunodeficiency virus (HIV) disease (HCC) 04/06/2006    Priority: High  . Former cigarette smoker 07/29/2008    Priority: Medium  . Anxiety state 04/06/2006    Priority: Medium  . Polysubstance abuse (HCC) 04/06/2006    Priority: Medium  . Depression 04/06/2006    Priority: Medium  . Hepatitis C 04/06/2006    Priority: Low  . Pharyngitis 11/15/2016  . DENTAL CARIES 04/06/2006  . ANAL FISSURE 04/06/2006    Patient's Medications  New Prescriptions   No medications on file  Previous Medications   DARUNAVIR-COBICISCTAT-EMTRICITABINE-TENOFOVIR ALAFENAMIDE (SYMTUZA) 800-150-200-10 MG TABS    Take 1 tablet by mouth daily with breakfast.   LORAZEPAM (ATIVAN) 2 MG TABLET    TAKE 1 TABLET BY MOUTH TWICE DAILY AS NEEDED FOR ANXIETY   PAROXETINE (PAXIL) 40 MG TABLET    TAKE 1 TABLET BY MOUTH EVERY DAY   PITAVASTATIN CALCIUM 4 MG TABS    Take 1 tablet (4 mg total) by mouth daily. Study provided. May be placebo. DO NOT FILL  Modified Medications   No medications on file  Discontinued Medications   DESCOVY 200-25 MG TABLET    TK 1 T PO D   PREZCOBIX 800-150 MG TABLET    1 tablet.     Subjective: Jordan Calderon is in for his routine HIV follow-up visit.  He has all of his medications with him.  He denies missing any doses of Descovy and Tivicay.  He did not understand that these were supposed to have [facility] after his visit with our pharmacist on 06/29/2016.  He has not used any illicit drugs since his last visit.  He is feeling much better.  He says that his anxiety and depression are under better control.  He felt like seeing Jordan Calderon, our counselor was helping but he had to stop after his 6 visits.  Review of Systems: Review of Systems  Constitutional: Negative for chills, diaphoresis, fever, malaise/fatigue and weight loss.  HENT: Negative for sore throat.   Respiratory: Negative for cough,  sputum production and shortness of breath.   Cardiovascular: Negative for chest pain.  Gastrointestinal: Negative for abdominal pain, diarrhea, heartburn, nausea and vomiting.  Genitourinary: Negative for dysuria and frequency.  Musculoskeletal: Negative for joint pain and myalgias.  Skin: Negative for rash.  Neurological: Negative for dizziness and headaches.  Psychiatric/Behavioral: Positive for depression. Negative for substance abuse. The patient is nervous/anxious.     Past Medical History:  Diagnosis Date  . Anxiety   . Cellulitis 02/11/2014   LEFT ARM  . Depression   . Headache(784.0)   . HIV (human immunodeficiency virus infection) (HCC)     Social History   Tobacco Use  . Smoking status: Former Smoker    Packs/day: 0.10    Years: 30.00    Pack years: 3.00    Types: Cigarettes    Last attempt to quit: 11/19/2014    Years since quitting: 2.6  . Smokeless tobacco: Never Used  Substance Use Topics  . Alcohol use: No    Alcohol/week: 0.0 oz  . Drug use: No    Family History  Adopted: Yes    Allergies  Allergen Reactions  . Sulfonamide Derivatives     REACTION: itching    Health Maintenance  Topic Date Due  . TETANUS/TDAP  07/14/1978  . COLONOSCOPY  07/14/2009  . INFLUENZA VACCINE  Completed  . Hepatitis C Screening  Completed  . HIV Screening  Completed    Objective:  Vitals:   07/18/17 0900  BP: (!) 145/93  Pulse: (!) 101  Temp: 98.1 F (36.7 C)  TempSrc: Oral  Weight: 181 lb (82.1 kg)  Height: 5\' 9"  (1.753 m)   Body mass index is 26.73 kg/m.  Physical Exam  Constitutional: He is oriented to person, place, and time.  He is smiling and in good spirits.  HENT:  Mouth/Throat: No oropharyngeal exudate.  Eyes: Conjunctivae are normal.  Cardiovascular: Normal rate and regular rhythm.  No murmur heard. Pulmonary/Chest: Effort normal and breath sounds normal.  Abdominal: Soft. He exhibits no mass. There is no tenderness.  Musculoskeletal:  Normal range of motion.  Neurological: He is alert and oriented to person, place, and time.  Skin: No rash noted.  Psychiatric: Mood and affect normal.    Lab Results Lab Results  Component Value Date   WBC 7.3 07/22/2016   HGB 15.8 07/22/2016   HCT 45.7 07/22/2016   MCV 94.0 07/22/2016   PLT 226 07/22/2016    Lab Results  Component Value Date   CREATININE 0.94 12/01/2016   BUN 11 12/01/2016   NA 139 12/01/2016   K 3.6 12/01/2016   CL 105 12/01/2016   CO2 25 12/01/2016    Lab Results  Component Value Date   ALT 15 12/01/2016   AST 14 12/01/2016   ALKPHOS 75 12/01/2016   BILITOT 0.3 12/01/2016    Lab Results  Component Value Date   CHOL 207 (H) 01/12/2017   HDL 31 (L) 01/12/2017   LDLCALC 145 (H) 01/12/2017   TRIG 157 (H) 01/12/2017   CHOLHDL 6.7 (H) 01/12/2017   Lab Results  Component Value Date   LABRPR NON REAC 01/12/2017   HIV 1 RNA Quant (copies/mL)  Date Value  06/27/2017 <20 DETECTED (A)  03/14/2017 36 (H)  01/12/2017 281 (H)   CD4 T Cell Abs (/uL)  Date Value  06/27/2017 300 (L)  03/14/2017 128 (L)  01/12/2017 300 (L)     Problem List Items Addressed This Visit      High   Human immunodeficiency virus (HIV) disease (HCC)    His infection is now under much better control since restarting medications 9 months ago.  I will switch him to [facility] he will follow-up after blood work in 3 months.      Relevant Orders   T-helper cell (CD4)- ([facility] clinic only)   HIV 1 RNA quant-no reflex-bld     Medium   Anxiety state    I will set him up to see our other behavioral health counselor, Jordan Calderon, for ongoing counseling.      Polysubstance abuse (HCC)    I congratulated him on his sobriety.           [name], MD [facility] for Infectious Disease [facility] [phone] pager   [phone] cell 07/18/2017, 9:44 AM

## 2017-07-18 NOTE — Assessment & Plan Note (Signed)
I congratulated him on his sobriety. 

## 2017-07-18 NOTE — Assessment & Plan Note (Signed)
His infection is now under much better control since restarting medications 9 months ago.  I will [facility] he will follow-up after blood work in 3 months.

## 2017-07-18 NOTE — Assessment & Plan Note (Signed)
I will set him up to see our other behavioral health counselor, Marylu LundJanet, for ongoing counseling.

## 2017-07-27 ENCOUNTER — Ambulatory Visit: Payer: Medicare Other

## 2017-08-03 ENCOUNTER — Ambulatory Visit: Payer: Medicare Other

## 2017-08-10 ENCOUNTER — Ambulatory Visit: Payer: Medicare Other

## 2017-08-11 ENCOUNTER — Other Ambulatory Visit: Payer: Self-pay | Admitting: Internal Medicine

## 2017-08-11 DIAGNOSIS — F4310 Post-traumatic stress disorder, unspecified: Secondary | ICD-10-CM

## 2017-08-17 ENCOUNTER — Ambulatory Visit: Payer: Medicare Other

## 2017-08-21 ENCOUNTER — Other Ambulatory Visit: Payer: Self-pay | Admitting: *Deleted

## 2017-08-21 DIAGNOSIS — B20 Human immunodeficiency virus [HIV] disease: Secondary | ICD-10-CM

## 2017-08-21 MED ORDER — DARUN-COBIC-EMTRICIT-TENOFAF 800-150-200-10 MG PO TABS: 1.0000 | ORAL_TABLET | Freq: Every day | ORAL | 5 refills | Status: DC

## 2017-08-22 ENCOUNTER — Telehealth: Payer: Self-pay

## 2017-08-22 NOTE — Telephone Encounter (Signed)
Pa approved for Symtuza called the pharmacy to update them on the status of the PA. Pharmacist stated they would run the claim again before dispensing the medication. Jordan Calderon, [place]

## 2017-08-31 ENCOUNTER — Other Ambulatory Visit: Payer: Self-pay | Admitting: Infectious Disease

## 2017-08-31 DIAGNOSIS — F411 Generalized anxiety disorder: Secondary | ICD-10-CM

## 2017-09-04 ENCOUNTER — Telehealth: Payer: Self-pay | Admitting: *Deleted

## 2017-09-04 NOTE — Telephone Encounter (Signed)
Patient is asking for refill of his lorazepam 2mg  BID PRN #60.  Per pharmacy, patient has been filling prescription monthly. Please advise.  Andree CossHowell, Krue Peterka M, RN

## 2017-09-05 NOTE — Telephone Encounter (Signed)
Jordan Calderon may have 3 refills.  Thanks.

## 2017-09-05 NOTE — Telephone Encounter (Signed)
Called in refills, patient aware.

## 2017-10-05 ENCOUNTER — Other Ambulatory Visit: Payer: Medicare Other

## 2017-10-05 ENCOUNTER — Ambulatory Visit (INDEPENDENT_AMBULATORY_CARE_PROVIDER_SITE_OTHER): Payer: Medicare Other | Admitting: Licensed Clinical Social Worker

## 2017-10-05 ENCOUNTER — Ambulatory Visit: Payer: Medicare Other

## 2017-10-05 DIAGNOSIS — F411 Generalized anxiety disorder: Secondary | ICD-10-CM

## 2017-10-05 DIAGNOSIS — B20 Human immunodeficiency virus [HIV] disease: Secondary | ICD-10-CM

## 2017-10-05 DIAGNOSIS — F330 Major depressive disorder, recurrent, mild: Secondary | ICD-10-CM

## 2017-10-05 NOTE — BH Specialist Note (Signed)
Integrated Behavioral Health Initial Visit  MRN: [id number] Name: Jordan Calderon  Number of Integrated Behavioral Health Clinician visits:: 1/6 Session Start time: 10:04am  Session End time: 10:36am Total time: 30 minutes  Type of Service: Integrated Behavioral Health- Individual/Family Interpretor:No. Interpretor Name and Language: n/a    SUBJECTIVE: Jordan Calderon is a 57 y.o. male accompanied by self Patient was referred by Dr Orvan Falconerampbell for anxiety and depression. Patient reports the following symptoms/concerns: lack of motivation (getting better), tendency to isolate, intrusive thoughts/worries, trouble sleeping; no longer experiences: crying spells, lack of energy, irritability, substance use Duration of problem: 3 months; Severity of problem: mild  OBJECTIVE: Mood: Anxious and Affect: Constricted Risk of harm to self or others: No plan to harm self or others  LIFE CONTEXT: Family and Social: has a sister that he disagrees with about lifestyle and basic decisions; lives alone with 3 dogs, shares 2 with ex-partner (amicable breakup) who comes over at times to visit them; has friends but worries he is a burden to them because he doesn't drive and having to bring him home from their house or events is problematic if they want to drink at the event School/Work: on disability; worked as a Airline pilotwaiter for many years and would like to get back into it but does not feel strong enough at this time to withstand the offers of drugs and alcohol that are a part of the food scene Self-Care: denies doing much for himself, used to enjoy crafting but lost that enjoyment; has just begun to clean the house and mow the yard again, which makes him feel better about himself Life Changes: single for 4 years and this is the first time in adulthood he has not had a long-term relationship; not working any longer; struggled with the changeover in counselors and therefore did not follow up with counselor here at  last recommendation  GOALS ADDRESSED: Patient will: 1. Reduce symptoms of: anxiety  2. Increase knowledge and/or ability of: coping skills for depressive symptoms  INTERVENTIONS: Interventions utilized: Motivational Interviewing and Supportive Counseling   ASSESSMENT: Patient currently experiencing difficulty sleeping without Lorazepam (too many thoughts he can't quiet on his own), anhedonia, worry thoughts, decreased energy (though it is more than has been in the recent past). He reports feeling lonely but not wanting or being able to do the things that it might take to find a partner; asserts that if that is meant to happen it will happen somehow. Therapist guided patient to explore the place he wants to be in so that if another person does enter his life he can be strong enough to handle it in a healthy way.    Patient may benefit from ongoing counseling every 2-3 weeks.  PLAN: 1. Follow up with behavioral health clinician on : 10/24/17 @ 11AM  Angus Palmsegina Alexander, LCSW

## 2017-10-06 LAB — T-HELPER CELL (CD4) - (RCID CLINIC ONLY)
CD4 % Helper T Cell: 14 % — ABNORMAL LOW (ref 33–55)
CD4 T Cell Abs: 260 /uL — ABNORMAL LOW (ref 400–2700)

## 2017-10-10 LAB — HIV-1 RNA QUANT-NO REFLEX-BLD
HIV 1 RNA QUANT: 339 {copies}/mL — AB
HIV-1 RNA Quant, Log: 2.53 Log copies/mL — ABNORMAL HIGH

## 2017-10-11 ENCOUNTER — Other Ambulatory Visit: Payer: Self-pay | Admitting: Internal Medicine

## 2017-10-11 DIAGNOSIS — F4310 Post-traumatic stress disorder, unspecified: Secondary | ICD-10-CM

## 2017-10-13 ENCOUNTER — Telehealth: Payer: Self-pay | Admitting: Behavioral Health

## 2017-10-13 NOTE — Telephone Encounter (Signed)
-----   Message from [name], MD sent at 10/12/2017  1:25 PM EDT ----- Pt needs to tighten up adherence and see Jordan RuizJohn soon. VL is above 300

## 2017-10-13 NOTE — Telephone Encounter (Signed)
I would recommend him getting another viral load at that visit because he should be below 200 in fact ideally <20 if he truly is taking it daily.

## 2017-10-13 NOTE — Telephone Encounter (Signed)
Called patient, verified identity, informed patient per Dr. Daiva EvesVan Dam that his Viral Load was above 300 and asked if he was taking his medications.  Patient denies missing any doses of his Symtuza.  Patient states [facility] [place] [facility]  Patient states he does not always take his medications with a meal but states he takes it everyday.  Patient has an upcoming office visit with Dr. Orvan Falconerampbell 10/19/2017.  Patient had no additional questions or concerns. Angeline SlimAshley Hill RN

## 2017-10-19 ENCOUNTER — Ambulatory Visit: Payer: Medicare Other

## 2017-10-19 ENCOUNTER — Ambulatory Visit: Payer: Medicare Other | Admitting: Internal Medicine

## 2017-10-24 ENCOUNTER — Institutional Professional Consult (permissible substitution): Payer: Medicare Other | Admitting: Licensed Clinical Social Worker

## 2017-10-24 ENCOUNTER — Encounter: Payer: Medicare Other | Admitting: *Deleted

## 2017-11-16 ENCOUNTER — Ambulatory Visit (INDEPENDENT_AMBULATORY_CARE_PROVIDER_SITE_OTHER): Payer: Medicare Other | Admitting: Internal Medicine

## 2017-11-16 ENCOUNTER — Encounter: Payer: Self-pay | Admitting: Internal Medicine

## 2017-11-16 ENCOUNTER — Ambulatory Visit (INDEPENDENT_AMBULATORY_CARE_PROVIDER_SITE_OTHER): Payer: Medicare Other | Admitting: Licensed Clinical Social Worker

## 2017-11-16 DIAGNOSIS — B182 Chronic viral hepatitis C: Secondary | ICD-10-CM | POA: Diagnosis present

## 2017-11-16 DIAGNOSIS — F1910 Other psychoactive substance abuse, uncomplicated: Secondary | ICD-10-CM | POA: Diagnosis not present

## 2017-11-16 DIAGNOSIS — F331 Major depressive disorder, recurrent, moderate: Secondary | ICD-10-CM

## 2017-11-16 DIAGNOSIS — F411 Generalized anxiety disorder: Secondary | ICD-10-CM | POA: Diagnosis not present

## 2017-11-16 DIAGNOSIS — B20 Human immunodeficiency virus [HIV] disease: Secondary | ICD-10-CM

## 2017-11-16 NOTE — Progress Notes (Signed)
HPI: Jordan Calderon is a 57 y.o. male who is here for his HIV visit with Dr. Orvan Falconer   Allergies: Allergies  Allergen Reactions  . Sulfonamide Derivatives     REACTION: itching    Vitals: Temp: 98.2 F (36.8 C) (05/30 1044) Temp Source: Oral (05/30 1044) BP: 113/81 (05/30 1044) Pulse Rate: 80 (05/30 1044)  Past Medical History: Past Medical History:  Diagnosis Date  . Anxiety   . Cellulitis 02/11/2014   LEFT ARM  . Depression   . Headache(784.0)   . HIV (human immunodeficiency virus infection) (HCC)     Social History: Social History   Socioeconomic History  . Marital status: Single    Spouse name: Not on file  . Number of children: Not on file  . Years of education: Not on file  . Highest education level: Not on file  Occupational History  . Not on file  Social Needs  . Financial resource strain: Not on file  . Food insecurity:    Worry: Not on file    Inability: Not on file  . Transportation needs:    Medical: Not on file    Non-medical: Not on file  Tobacco Use  . Smoking status: Former Smoker    Packs/day: 0.10    Years: 30.00    Pack years: 3.00    Types: Cigarettes    Last attempt to quit: 11/19/2014    Years since quitting: 2.9  . Smokeless tobacco: Never Used  Substance and Sexual Activity  . Alcohol use: No    Alcohol/week: 0.0 oz  . Drug use: No  . Sexual activity: Not on file  Lifestyle  . Physical activity:    Days per week: Not on file    Minutes per session: Not on file  . Stress: Not on file  Relationships  . Social connections:    Talks on phone: Not on file    Gets together: Not on file    Attends religious service: Not on file    Active member of club or organization: Not on file    Attends meetings of clubs or organizations: Not on file    Relationship status: Not on file  Other Topics Concern  . Not on file  Social History Narrative  . Not on file    Previous Regimen: DRV/r + Truvada>>Prezcobix/Descovy  Current  Regimen: Symtuza  Labs: HIV 1 RNA Quant (copies/mL)  Date Value  10/05/2017 339 (H)  06/27/2017 <20 DETECTED (A)  03/14/2017 36 (H)   CD4 T Cell Abs (/uL)  Date Value  10/05/2017 260 (L)  06/27/2017 300 (L)  03/14/2017 128 (L)   Hep B S Ab (no units)  Date Value  08/14/2006 No   Hepatitis B Surface Ag (no units)  Date Value  08/14/2006 No   HCV Ab (no units)  Date Value  08/14/2006 No    CrCl: CrCl cannot be calculated (Patient's most recent lab result is older than the maximum 21 days allowed.).  Lipids:    Component Value Date/Time   CHOL 207 (H) 01/12/2017 0933   TRIG 157 (H) 01/12/2017 0933   HDL 31 (L) 01/12/2017 0933   CHOLHDL 6.7 (H) 01/12/2017 0933   VLDL 31 (H) 01/12/2017 0933   LDLCALC 145 (H) 01/12/2017 0933    Assessment: Jordan Calderon is here today for his visit with Dr. Orvan Falconer for his HIV visit. He was doing well for a while before falling off the wagon again with his adherence. He stopped it  about a month ago after getting into it with his sister again after she saw recent VL. He doesn't like the symtuza because it has to be taken with food. However, we was on Prezcobix/Descovy previously which would require food anyway. He was taking it without food. Advised him that he can take with any meals and not just with breakfast but he needs to be consistent with the meal on a daily basis. We thought about using Biktarvy due to the meal requirement but he rather stays on Symtuza. He will restart his [place] tomorrow. He will come back to see pharmacy in one month for his labs and adherence follow up.   Recommendations:  Restart Symtuza 1 PO qday F/u with pharmacy in 1 month  Ulyses Southward, PharmD, BCPS, AAHIVP, CPP Clinical Infectious Disease Pharmacist [facility] 11/16/2017, 11:20 AM

## 2017-11-16 NOTE — Assessment & Plan Note (Addendum)
Jordan Calderon continues to roller coaster up and down.  When he starts using cocaine he usually becomes more anxious and depressed and stopped taking his antiretroviral medication.  I had him meet with our pharmacist today to discuss treatment options.  He has been under the impression that he had to take Symtuza with breakfast but he does not always eat breakfast.  He was informed that he can take it with any meal of the day.  He would like to restart it now.  He will follow-up with our pharmacist in 1 month.

## 2017-11-16 NOTE — Progress Notes (Signed)
Patient Active Problem List   Diagnosis Date Noted  . Human immunodeficiency virus (HIV) disease (HCC) 04/06/2006    Priority: High  . Former cigarette smoker 07/29/2008    Priority: Medium  . Anxiety state 04/06/2006    Priority: Medium  . Polysubstance abuse (HCC) 04/06/2006    Priority: Medium  . Depression 04/06/2006    Priority: Medium  . Hepatitis C 04/06/2006    Priority: Low  . Pharyngitis 11/15/2016  . DENTAL CARIES 04/06/2006  . ANAL FISSURE 04/06/2006    Patient's Medications  New Prescriptions   No medications on file  Previous Medications   DARUNAVIR-COBICISCTAT-EMTRICITABINE-TENOFOVIR ALAFENAMIDE (SYMTUZA) 800-150-200-10 MG TABS    Take 1 tablet by mouth daily with breakfast.   LORAZEPAM (ATIVAN) 2 MG TABLET    TAKE 1 TABLET BY MOUTH TWICE DAILY AS NEEDED FOR ANXIETY   PAROXETINE (PAXIL) 40 MG TABLET    TAKE 1 TABLET BY MOUTH EVERY DAY   PITAVASTATIN CALCIUM 4 MG TABS    Take 1 tablet (4 mg total) by mouth daily. Study provided. May be placebo. DO NOT FILL  Modified Medications   No medications on file  Discontinued Medications   No medications on file    Subjective: Jordan Calderon is in for his routine HIV follow-up visit.  At the time of his last visit we changed him from Descovy and Prezcobix to [employer].  He says that he does not have regular meals schedules and found it difficult to take Symtuza with a meal consistently.  He had a repeat viral load that was elevated at 339.  His sister, Jordan Calderon, became upset when she saw that his viral load had gone up.  She threatened to evict him from his house because she was concerned that he was not taking his medication correctly.  He decided to stop Symtuza 1 month ago but did not call us.  He started smoking crack cocaine again.  He has not been taking his paroxetine or lorazepam consistently and has become much more depressed and anxious.  He denies missing doses of his [employer] study medication because he says he does  not want to "screw up anyone's research".  Review of Systems: Review of Systems  Constitutional: Negative for chills, diaphoresis, fever, malaise/fatigue and weight loss.  HENT: Negative for sore throat.   Respiratory: Negative for cough, sputum production and shortness of breath.   Cardiovascular: Negative for chest pain.  Gastrointestinal: Negative for abdominal pain, diarrhea, heartburn, nausea and vomiting.  Genitourinary: Negative for dysuria and frequency.  Musculoskeletal: Negative for joint pain and myalgias.  Skin: Negative for rash.  Neurological: Negative for dizziness and headaches.  Psychiatric/Behavioral: Positive for depression and substance abuse. Negative for suicidal ideas. The patient is nervous/anxious.     Past Medical History:  Diagnosis Date  . Anxiety   . Cellulitis 02/11/2014   LEFT ARM  . Depression   . Headache(784.0)   . HIV (human immunodeficiency virus infection) (HCC)     Social History   Tobacco Use  . Smoking status: Former Smoker    Packs/day: 0.10    Years: 30.00    Pack years: 3.00    Types: Cigarettes    Last attempt to quit: 11/19/2014    Years since quitting: 2.9  . Smokeless tobacco: Never Used  Substance Use Topics  . Alcohol use: No    Alcohol/week: 0.0 oz  . Drug use: No    Family History  Adopted: Yes  Allergies  Allergen Reactions  . Sulfonamide Derivatives     REACTION: itching    Health Maintenance  Topic Date Due  . TETANUS/TDAP  07/14/1978  . COLONOSCOPY  07/14/2009  . INFLUENZA VACCINE  01/18/2018  . Hepatitis C Screening  Completed  . HIV Screening  Completed    Objective:  Vitals:   11/16/17 1044  BP: 113/81  Pulse: 80  Temp: 98.2 F (36.8 C)  TempSrc: Oral  Weight: 171 lb (77.6 kg)   Body mass index is 25.25 kg/m.  Physical Exam  Constitutional: He is oriented to person, place, and time.  He is very anxious and tearful.  HENT:  Mouth/Throat: No oropharyngeal exudate.  Eyes:  Conjunctivae are normal.  Cardiovascular: Normal rate, regular rhythm and normal heart sounds.  No murmur heard. Pulmonary/Chest: Effort normal and breath sounds normal.  Abdominal: Soft. He exhibits no distension and no mass. There is no tenderness.  Musculoskeletal: Normal range of motion.  Neurological: He is alert and oriented to person, place, and time.  Skin: No rash noted.  Psychiatric:  Poor eye contact.    Lab Results Lab Results  Component Value Date   WBC 7.3 07/22/2016   HGB 15.8 07/22/2016   HCT 45.7 07/22/2016   MCV 94.0 07/22/2016   PLT 226 07/22/2016    Lab Results  Component Value Date   CREATININE 0.94 12/01/2016   BUN 11 12/01/2016   NA 139 12/01/2016   K 3.6 12/01/2016   CL 105 12/01/2016   CO2 25 12/01/2016    Lab Results  Component Value Date   ALT 15 12/01/2016   AST 14 12/01/2016   ALKPHOS 75 12/01/2016   BILITOT 0.3 12/01/2016    Lab Results  Component Value Date   CHOL 207 (H) 01/12/2017   HDL 31 (L) 01/12/2017   LDLCALC 145 (H) 01/12/2017   TRIG 157 (H) 01/12/2017   CHOLHDL 6.7 (H) 01/12/2017   Lab Results  Component Value Date   LABRPR NON REAC 01/12/2017   HIV 1 RNA Quant (copies/mL)  Date Value  10/05/2017 339 (H)  06/27/2017 <20 DETECTED (A)  03/14/2017 36 (H)   CD4 T Cell Abs (/uL)  Date Value  10/05/2017 260 (L)  06/27/2017 300 (L)  03/14/2017 128 (L)     Problem List Items Addressed This Visit      High   Human immunodeficiency virus (HIV) disease (HCC)    [place] continues to roller Calderon up and down.  When he starts using cocaine he usually becomes more anxious and depressed and stopped taking his antiretroviral medication.  I had him meet with our pharmacist today to discuss treatment options.  He has been under the impression that he had to take Symtuza with breakfast but he does not always eat breakfast.  He was informed that he can take it with any meal of the day.  He would like to restart it now.  He will  follow-up with our pharmacist in 1 month.        Medium   Anxiety state    He is much more anxious and depressed.  He cannot tell me why he stopped taking his fluoxetine and lorazepam consistently.  To my knowledge he has not done this in the past.  He says that his difficulties dealing with Jordan Calderon have made him feel like getting up but he denies having any thoughts or plans to harm himself. I will arrange close follow-up with our behavioral health counselor, Angus Palms.  Polysubstance abuse (HCC)    His addiction has resurfaced and he is using crack cocaine again.  He does appear motivated to stop stating "this is not me".        Low   Hepatitis C        [name], MD [facility] for Infectious Disease [facility] [phone] pager   [phone] cell 11/16/2017, 11:25 AM

## 2017-11-16 NOTE — Assessment & Plan Note (Signed)
His addiction has resurfaced and he is using crack cocaine again.  He does appear motivated to stop stating "this is not me".

## 2017-11-16 NOTE — BH Specialist Note (Signed)
Integrated Behavioral Health Follow Up Visit  MRN: [id number] Name: Jordan Calderon  Number of Integrated Behavioral Health Clinician visits: 2/6 Session Start time: 11:30am  Session End time: 12:46pm Total time: 45 minutes  Type of Service: Integrated Behavioral Health- Individual/Family Interpretor:No. Interpretor Name and Language: n/a  SUBJECTIVE: Jordan Calderon is a 57 y.o. male accompanied by self Patient was referred by Dr Orvan Falconer for depressive symptoms and substance abuse. Patient reports the following symptoms/concerns: no energy or motivation, hypersomnia, crying spells, anger, depressed mood, thoughts of death Duration of problem: unknown; Severity of problem: moderate  OBJECTIVE: Mood: Depressed and Affect: Flat Risk of harm to self or others: No plan to harm self or others; thoughts of death but no motivation or intention  LIFE CONTEXT: Patient stopped taking all of his medication. He was feeling "numb" and thought that his Paxil and Lorazepam were no longer working. However, once he stopped the meds his emotions were uncontrollable. Patient had thoughts about death, but states that he does not want to die and named his dogs as reasons he would never harm himself. He became tearful talking about how he would never want to leave them. Patient identifies problems with his sister (who owns the house he lives in) as a major stressor. He indicates that she does not approve of his life and that she tries to control it in any way that she can. For example, she checks his water usage each month and if there is extra used she assumes he has someone living with him and becomes angry with him. Patient states his sister controls her husband and 2 sons, and he will not allow her to control him as well, but he does not have the strength or energy it would take to resist her control. Additionally, patient has been using cocaine, which he is hesitant to talk about but states several times is  not a good thing or something he wants to continue.   GOALS ADDRESSED: Patient will: 1.  Reduce symptoms of: depression  2.  Increase knowledge and/or ability of: coping skills   INTERVENTIONS: Interventions utilized:  Motivational Interviewing and Solution-Focused Strategies  ASSESSMENT: Patient currently experiencing low energy/motivation, crying spells, emotional numbing, anxiety, trouble getting out of bed, depressed mood, thoughts of death/dying, irritability. His symptoms are most consistent with Major Depressive Disorder, Recurrent, Moderate, though he states he wonders sometimes about Bipolar Disorder and Attention Deficit Disorder. Patient processed his frustrations with sister and how this is impacting his day to day life. Counselor explored with patient his goals for and boundaries he wants to set in the relationship with his sister. Patient identified that he has been trying to stand up to sister but doesn't really have the energy. Counselor validated his emotional exhaustion and emphasized the importance of getting him to a state of emotional health where he can set and enforce healthy boundaries. Patient and counselor agreed that a first step toward this would be to take his medications as prescribed and refrain from using other substances. Counselor agrees to talk to Dr. Orvan Falconer about his recommendations regarding Paxil.  Patient may benefit from ongoing motivational interviewing to assess level of readiness for change and CBT to address depression.  PLAN: 1. Follow up with behavioral health clinician on : 11/24/17 at 10am 2. Behavioral recommendations: restart medications, refrain from cocaine use.  Angus Palms, LCSW

## 2017-11-16 NOTE — Assessment & Plan Note (Signed)
He is much more anxious and depressed.  He cannot tell me why he stopped taking his fluoxetine and lorazepam consistently.  To my knowledge he has not done this in the past.  He says that his difficulties dealing with [name] have made him feel like getting up but he denies having any thoughts or plans to harm himself. I will arrange close follow-up with our behavioral health counselor, Angus Palms.

## 2017-11-24 ENCOUNTER — Ambulatory Visit: Payer: Medicare Other | Admitting: Licensed Clinical Social Worker

## 2017-11-27 ENCOUNTER — Encounter: Payer: Medicare Other | Admitting: *Deleted

## 2017-12-05 ENCOUNTER — Other Ambulatory Visit: Payer: Self-pay | Admitting: Internal Medicine

## 2017-12-05 DIAGNOSIS — F4310 Post-traumatic stress disorder, unspecified: Secondary | ICD-10-CM

## 2017-12-18 ENCOUNTER — Ambulatory Visit: Payer: Medicare Other

## 2017-12-27 ENCOUNTER — Other Ambulatory Visit: Payer: Self-pay | Admitting: Internal Medicine

## 2017-12-27 DIAGNOSIS — F411 Generalized anxiety disorder: Secondary | ICD-10-CM

## 2017-12-28 ENCOUNTER — Other Ambulatory Visit: Payer: Self-pay

## 2017-12-28 ENCOUNTER — Telehealth: Payer: Self-pay

## 2017-12-28 DIAGNOSIS — F411 Generalized anxiety disorder: Secondary | ICD-10-CM

## 2017-12-28 MED ORDER — LORAZEPAM 2 MG PO TABS: 2.0000 mg | ORAL_TABLET | Freq: Two times a day (BID) | ORAL | 3 refills | Status: DC | PRN

## 2017-12-28 NOTE — Telephone Encounter (Signed)
PT called today to have someone call in a refill for Lorazepam pt stated all his other medication are ready to pick up he just needs his ativan called in so he can pick them all up at the same time. Called pt's pharmacy [facility] [address].  Called pt's pharmacy and spoke with the pharmacist who was able to take a verbal to refill pt's medication. Pharmacist confirmed dosage and how many tabs to dispense for pt with refills. Pt is aware that the medication was called in. [name] CourierJose L Maldonado, [place]

## 2018-01-09 ENCOUNTER — Encounter (INDEPENDENT_AMBULATORY_CARE_PROVIDER_SITE_OTHER): Payer: Self-pay | Admitting: *Deleted

## 2018-01-09 ENCOUNTER — Ambulatory Visit (INDEPENDENT_AMBULATORY_CARE_PROVIDER_SITE_OTHER): Payer: Medicare Other | Admitting: Infectious Disease

## 2018-01-09 VITALS — BP 137/93 | HR 63 | Temp 97.8°F | Wt 184.2 lb

## 2018-01-09 DIAGNOSIS — Z006 Encounter for examination for normal comparison and control in clinical research program: Secondary | ICD-10-CM

## 2018-01-09 DIAGNOSIS — Z114 Encounter for screening for human immunodeficiency virus [HIV]: Secondary | ICD-10-CM

## 2018-01-09 DIAGNOSIS — B20 Human immunodeficiency virus [HIV] disease: Secondary | ICD-10-CM

## 2018-01-09 NOTE — Progress Notes (Signed)
[name] is here for his month 2624 Reprieve visit and to screen for the Latitude Study A5359, Long-Acting ART Therapy to Improve Treatment Success in Daily Life.  Informed consent was obtained. Reviewed risks, benefits, resposibilities and other options. He verbalized understanding and questions were answered. His VL has not been completely suppressed over the last year due to him starting and stopping his medication. He states that depression/anxiety along with other personal issues have played a part in his struggles with medication adherence. He denies any recent illicit drug use. He had an EKG and was seen by Dr. Daiva EvesVan Dam for PE today.  Once we determine if he is eligible for the study, we will schedule his entry visit. Verbalized excellent adherence with his current study medication. Denied any muscle aches or weakness. No new problems or concerns. His next Reprieve study visit scheduled for 03/22/18.

## 2018-01-09 NOTE — Progress Notes (Signed)
Subjective:    Patient ID: Jordan Calderon, male    DOB: 09/09/1960, 57 y.o.   MRN: [id number]  HPI  Jordan Calderon  Is here for CPE prior to enrollment into LATTITUDE Study.  Lab Results  Component Value Date   HIV1RNAQUANT 339 (H) 10/05/2017   HIV1RNAQUANT <20 DETECTED (A) 06/27/2017   HIV1RNAQUANT 36 (H) 03/14/2017   He has no complaints today  Past Medical History:  Diagnosis Date  . Anxiety   . Cellulitis 02/11/2014   LEFT ARM  . Depression   . Headache(784.0)   . HIV (human immunodeficiency virus infection) (HCC)     Past Surgical History:  Procedure Laterality Date  . arm surgery    . HEMORRHOID SURGERY      Family History  Adopted: Yes      Social History   Socioeconomic History  . Marital status: Single    Spouse name: Not on file  . Number of children: Not on file  . Years of education: Not on file  . Highest education level: Not on file  Occupational History  . Not on file  Social Needs  . Financial resource strain: Not on file  . Food insecurity:    Worry: Not on file    Inability: Not on file  . Transportation needs:    Medical: Not on file    Non-medical: Not on file  Tobacco Use  . Smoking status: Former Smoker    Packs/day: 0.10    Years: 30.00    Pack years: 3.00    Types: Cigarettes    Last attempt to quit: 11/19/2014    Years since quitting: 3.1  . Smokeless tobacco: Never Used  Substance and Sexual Activity  . Alcohol use: No    Alcohol/week: 0.0 oz  . Drug use: No  . Sexual activity: Not on file  Lifestyle  . Physical activity:    Days per week: Not on file    Minutes per session: Not on file  . Stress: Not on file  Relationships  . Social connections:    Talks on phone: Not on file    Gets together: Not on file    Attends religious service: Not on file    Active member of club or organization: Not on file    Attends meetings of clubs or organizations: Not on file    Relationship status: Not on file  Other Topics Concern  .  Not on file  Social History Narrative  . Not on file    Allergies  Allergen Reactions  . Sulfonamide Derivatives     REACTION: itching     Current Outpatient Medications:  .  Darunavir-Cobicisctat-Emtricitabine-Tenofovir Alafenamide (SYMTUZA) 800-150-200-10 MG TABS, Take 1 tablet by mouth daily with breakfast. (Patient not taking: Reported on 11/16/2017), Disp: 30 tablet, Rfl: 5 .  LORazepam (ATIVAN) 2 MG tablet, Take 1 tablet (2 mg total) by mouth 2 (two) times daily as needed. for anxiety, Disp: 60 tablet, Rfl: 3 .  PARoxetine (PAXIL) 40 MG tablet, TAKE 1 TABLET BY MOUTH EVERY DAY, Disp: 30 tablet, Rfl: 0 .  PARoxetine (PAXIL) 40 MG tablet, TAKE 1 TABLET BY MOUTH EVERY DAY, Disp: 30 tablet, Rfl: 1 .  Pitavastatin Calcium 4 MG TABS, Take 1 tablet (4 mg total) by mouth daily. Study provided. May be placebo. DO NOT FILL, Disp: 30 tablet, Rfl: 11    Review of Systems  Constitutional: Negative for chills and fever.  HENT: Negative for congestion and sore throat.  Eyes: Negative for photophobia.  Respiratory: Negative for cough, shortness of breath and wheezing.   Cardiovascular: Negative for chest pain, palpitations and leg swelling.  Gastrointestinal: Negative for abdominal pain, blood in stool, constipation, diarrhea, nausea and vomiting.  Genitourinary: Negative for dysuria, flank pain and hematuria.  Musculoskeletal: Negative for back pain and myalgias.  Skin: Negative for rash.  Neurological: Negative for dizziness, weakness and headaches.  Hematological: Does not bruise/bleed easily.  Psychiatric/Behavioral: Negative for suicidal ideas.       Objective:   Physical Exam  Constitutional: He is oriented to person, place, and time. He appears well-developed and well-nourished. No distress.  HENT:  Head: Normocephalic and atraumatic.  Right Ear: External ear normal.  Left Ear: External ear normal.  Nose: Nose normal.  Mouth/Throat: Oropharynx is clear and moist. No  oropharyngeal exudate.  Eyes: Pupils are equal, round, and reactive to light. Conjunctivae and EOM are normal. Right eye exhibits no discharge. Left eye exhibits no discharge. No scleral icterus.  Neck: Normal range of motion. Neck supple. No tracheal deviation present.  Cardiovascular: Normal rate, regular rhythm and normal heart sounds. Exam reveals no gallop and no friction rub.  No murmur heard. Pulmonary/Chest: Effort normal and breath sounds normal. No respiratory distress. He has no wheezes. He has no rales.  Abdominal: Soft. Bowel sounds are normal. He exhibits no distension and no mass. There is no hepatosplenomegaly. There is no tenderness. There is no rebound and no guarding.  Musculoskeletal: Normal range of motion. He exhibits no edema or tenderness.  Lymphadenopathy:       Head (right side): No submental, no submandibular, no tonsillar, no preauricular, no posterior auricular and no occipital adenopathy present.       Head (left side): No submental, no submandibular, no tonsillar, no preauricular, no posterior auricular and no occipital adenopathy present.    He has no cervical adenopathy.       Right cervical: No superficial cervical, no deep cervical and no posterior cervical adenopathy present.      Left cervical: No superficial cervical, no deep cervical and no posterior cervical adenopathy present.  Neurological: He is alert and oriented to person, place, and time. He has normal strength. He displays no atrophy and no tremor. No cranial nerve deficit or sensory deficit. He exhibits normal muscle tone. He displays no seizure activity. Coordination and gait normal. GCS eye subscore is 4. GCS verbal subscore is 5. GCS motor subscore is 6.  Skin: Skin is warm and dry. No rash noted. He is not diaphoretic. No erythema. No pallor.  Psychiatric: He has a normal mood and affect. His behavior is normal. Judgment and thought content normal.          Assessment & Plan:   Normal CPE.  Getting ready to enroll into ACTG study. He had EKG showing NSR with T w inversion in lead III. Prior EKG from 2012 showed no inversion here but in lead V1 (not seen today). Otherwise no findings.

## 2018-01-10 LAB — URINALYSIS, ROUTINE W REFLEX MICROSCOPIC
Bilirubin Urine: NEGATIVE
Glucose, UA: NEGATIVE
HGB URINE DIPSTICK: NEGATIVE
Ketones, ur: NEGATIVE
LEUKOCYTES UA: NEGATIVE
NITRITE: NEGATIVE
PH: 6 (ref 5.0–8.0)
PROTEIN: NEGATIVE
Specific Gravity, Urine: 1.018 (ref 1.001–1.03)

## 2018-01-11 LAB — HIV-1 RNA QUANT-NO REFLEX-BLD
HIV 1 RNA Quant: 2040 copies/mL — ABNORMAL HIGH
HIV-1 RNA Quant, Log: 3.31 Log copies/mL — ABNORMAL HIGH

## 2018-01-12 LAB — COMPREHENSIVE METABOLIC PANEL
AG Ratio: 1.3 (calc) (ref 1.0–2.5)
ALT: 23 U/L (ref 9–46)
AST: 19 U/L (ref 10–35)
Albumin: 4.4 g/dL (ref 3.6–5.1)
Alkaline phosphatase (APISO): 91 U/L (ref 40–115)
BUN: 17 mg/dL (ref 7–25)
CO2: 23 mmol/L (ref 20–32)
CREATININE: 1 mg/dL (ref 0.70–1.33)
Calcium: 9 mg/dL (ref 8.6–10.3)
Chloride: 102 mmol/L (ref 98–110)
Globulin: 3.5 g/dL (calc) (ref 1.9–3.7)
Glucose, Bld: 93 mg/dL (ref 65–99)
Potassium: 4.3 mmol/L (ref 3.5–5.3)
Sodium: 136 mmol/L (ref 135–146)
Total Bilirubin: 0.3 mg/dL (ref 0.2–1.2)
Total Protein: 7.9 g/dL (ref 6.1–8.1)

## 2018-01-12 LAB — HEPATITIS B CORE ANTIBODY, TOTAL: Hep B Core Total Ab: REACTIVE — AB

## 2018-01-12 LAB — HEPATITIS B SURFACE ANTIGEN: Hepatitis B Surface Ag: NONREACTIVE

## 2018-01-12 LAB — HEPATITIS C ANTIBODY
HEP C AB: NONREACTIVE
SIGNAL TO CUT-OFF: 0.15 (ref <1.00)

## 2018-01-12 LAB — BILIRUBIN, DIRECT: Bilirubin, Direct: 0.1 mg/dL (ref 0.0–0.2)

## 2018-01-12 LAB — HIV-1/2 AB - DIFFERENTIATION
HIV 1 ANTIBODY: POSITIVE — AB
HIV 2 AB: NEGATIVE

## 2018-01-12 LAB — HEPATITIS B SURFACE ANTIBODY,QUALITATIVE: Hep B S Ab: REACTIVE — AB

## 2018-01-12 LAB — HIV ANTIBODY (ROUTINE TESTING W REFLEX): HIV 1&2 Ab, 4th Generation: REACTIVE — AB

## 2018-01-16 ENCOUNTER — Ambulatory Visit: Payer: Self-pay | Admitting: Licensed Clinical Social Worker

## 2018-01-16 ENCOUNTER — Ambulatory Visit: Payer: Self-pay | Admitting: Internal Medicine

## 2018-01-24 ENCOUNTER — Encounter: Payer: Self-pay | Admitting: *Deleted

## 2018-01-24 LAB — CD4/CD8 (T-HELPER/T-SUPPRESSOR CELL)
CD4 Count: 312
CD4%: 15.6
CD8 T CELL SUPPRESSOR: 63.9
CD8: 1278

## 2018-01-25 ENCOUNTER — Encounter: Payer: Self-pay | Admitting: Internal Medicine

## 2018-01-25 ENCOUNTER — Ambulatory Visit (INDEPENDENT_AMBULATORY_CARE_PROVIDER_SITE_OTHER): Payer: Medicare Other | Admitting: Internal Medicine

## 2018-01-25 VITALS — BP 143/87 | HR 83 | Temp 98.0°F | Ht 69.0 in | Wt 175.0 lb

## 2018-01-25 DIAGNOSIS — B20 Human immunodeficiency virus [HIV] disease: Secondary | ICD-10-CM | POA: Diagnosis present

## 2018-01-25 MED ORDER — DARUNAVIR-COBICISTAT 800-150 MG PO TABS: 1.0000 | ORAL_TABLET | Freq: Every day | ORAL | 5 refills | Status: DC

## 2018-01-25 MED ORDER — EMTRICITABINE-TENOFOVIR AF 200-25 MG PO TABS: 1.0000 | ORAL_TABLET | Freq: Every day | ORAL | 5 refills | Status: DC

## 2018-01-25 NOTE — Addendum Note (Signed)
Addended by: Aggie CosierKUPPELWEISER, CASSIE L on: 01/25/2018 11:01 AM   Modules accepted: Orders

## 2018-01-25 NOTE — Assessment & Plan Note (Addendum)
I was very direct with [name] today.  I do not believe that he has been taking his medication consistently and correctly.  He is currently in the process of enrolling in protocol [id number] investigating long-acting injectable antiretroviral medications.  I told him that he will only get so many chances with his salvage regimen before he develops resistance.  He is scheduled for a repeat study visit soon and repeat viral load testing.  I will have him follow-up here in 3 months.

## 2018-01-25 NOTE — Progress Notes (Signed)
Patient Active Problem List   Diagnosis Date Noted  . Human immunodeficiency virus (HIV) disease (HCC) 04/06/2006    Priority: High  . Former cigarette smoker 07/29/2008    Priority: Medium  . Anxiety state 04/06/2006    Priority: Medium  . Polysubstance abuse (HCC) 04/06/2006    Priority: Medium  . Depression 04/06/2006    Priority: Medium  . Hepatitis C 04/06/2006    Priority: Low  . Pharyngitis 11/15/2016  . DENTAL CARIES 04/06/2006  . ANAL FISSURE 04/06/2006    Patient's Medications  New Prescriptions   No medications on file  Previous Medications   DARUNAVIR-COBICISCTAT-EMTRICITABINE-TENOFOVIR ALAFENAMIDE (SYMTUZA) 800-150-200-10 MG TABS    Take 1 tablet by mouth daily with breakfast.   LORAZEPAM (ATIVAN) 2 MG TABLET    Take 1 tablet (2 mg total) by mouth 2 (two) times daily as needed. for anxiety   PAROXETINE (PAXIL) 40 MG TABLET    TAKE 1 TABLET BY MOUTH EVERY DAY   PAROXETINE (PAXIL) 40 MG TABLET    TAKE 1 TABLET BY MOUTH EVERY DAY   PITAVASTATIN CALCIUM 4 MG TABS    Take 1 tablet (4 mg total) by mouth daily. Study provided. May be placebo. DO NOT FILL  Modified Medications   No medications on file  Discontinued Medications   No medications on file    Subjective: Jordan Calderon is in for his routine HIV follow-up visit.  Jordan Calderon recently started a salvage regimen of Symtuza.  Jordan Calderon says that Jordan Calderon started taking it on June 18.  Jordan Calderon has a calendar with him that supposedly shows that Jordan Calderon has not missed a single dose.  However a recent viral load obtained on 01/09/2018 was 2040.  A genotype resistance assay showed wild-type, sensitive virus.  I noted that Jordan Calderon has picked up only one bottle of Symtuza.  Jordan Calderon obtained that on 11/25/2017.  If Jordan Calderon had been taking it every day Jordan Calderon would have run out and required a refill by 01/04/2018.  Review of Systems: Review of Systems  Constitutional: Negative for fever.  Gastrointestinal: Negative for abdominal pain, diarrhea, nausea and vomiting.    Psychiatric/Behavioral: Positive for depression. The patient is nervous/anxious.     Past Medical History:  Diagnosis Date  . Anxiety   . Cellulitis 02/11/2014   LEFT ARM  . Depression   . Headache(784.0)   . HIV (human immunodeficiency virus infection) (HCC)     Social History   Tobacco Use  . Smoking status: Former Smoker    Packs/day: 0.10    Years: 30.00    Pack years: 3.00    Types: Cigarettes    Last attempt to quit: 11/19/2014    Years since quitting: 3.1  . Smokeless tobacco: Never Used  Substance Use Topics  . Alcohol use: No    Alcohol/week: 0.0 standard drinks  . Drug use: No    Family History  Adopted: Yes    Allergies  Allergen Reactions  . Sulfonamide Derivatives     REACTION: itching    Health Maintenance  Topic Date Due  . TETANUS/TDAP  07/14/1978  . COLONOSCOPY  07/14/2009  . INFLUENZA VACCINE  01/18/2018  . Hepatitis C Screening  Completed  . HIV Screening  Completed    Objective:  Vitals:   01/25/18 0958  BP: (!) 143/87  Pulse: 83  Temp: 98 F (36.7 C)  Weight: 175 lb (79.4 kg)  Height: 5\' 9"  (1.753 m)   Body mass index  is 25.84 kg/m.  Physical Exam  Constitutional: Jordan Calderon is oriented to person, place, and time.  Jordan Calderon is quiet and nervous as usual.  HENT:  Mouth/Throat: No oropharyngeal exudate.  Cardiovascular: Normal rate, regular rhythm and normal heart sounds.  Pulmonary/Chest: Effort normal and breath sounds normal.  Abdominal: Soft. There is no tenderness.  Neurological: Jordan Calderon is alert and oriented to person, place, and time.  Skin: No rash noted.    Lab Results Lab Results  Component Value Date   WBC 7.3 07/22/2016   HGB 15.8 07/22/2016   HCT 45.7 07/22/2016   MCV 94.0 07/22/2016   PLT 226 07/22/2016    Lab Results  Component Value Date   CREATININE 1.00 01/09/2018   BUN 17 01/09/2018   NA 136 01/09/2018   K 4.3 01/09/2018   CL 102 01/09/2018   CO2 23 01/09/2018    Lab Results  Component Value Date   ALT 23  01/09/2018   AST 19 01/09/2018   ALKPHOS 75 12/01/2016   BILITOT 0.3 01/09/2018    Lab Results  Component Value Date   CHOL 207 (H) 01/12/2017   HDL 31 (L) 01/12/2017   LDLCALC 145 (H) 01/12/2017   TRIG 157 (H) 01/12/2017   CHOLHDL 6.7 (H) 01/12/2017   Lab Results  Component Value Date   LABRPR NON REAC 01/12/2017   HIV 1 RNA Quant (copies/mL)  Date Value  01/09/2018 2,040 (H)  10/05/2017 339 (H)  06/27/2017 <20 DETECTED (A)   CD4 T Cell Abs (/uL)  Date Value  10/05/2017 260 (L)  06/27/2017 300 (L)  03/14/2017 128 (L)     Problem List Items Addressed This Visit      High   Human immunodeficiency virus (HIV) disease (HCC)    I was very direct with Jordan Calderon today.  I do not believe that Jordan Calderon has been taking his medication consistently and correctly.  Jordan Calderon is currently in the process of enrolling in protocol [id number] investigating long-acting injectable antiretroviral medications.  I told him that Jordan Calderon will only get so many chances with his salvage regimen before Jordan Calderon develops resistance.  Jordan Calderon is scheduled for a repeat study visit soon and repeat viral load testing.  I will have him follow-up here in 3 months.       Other Visit Diagnoses    HIV disease (HCC)    -  Primary        Cliffton AstersJohn Jestine Bicknell, MD [facility] [phone] pager   [phone] cell 01/25/2018, 10:47 AM

## 2018-01-25 NOTE — Progress Notes (Addendum)
HPI: Jordan Calderon is a 57 y.o. male who presents to the [facility] for HIV follow-up with Dr. Orvan Falconerampbell  Patient Active Problem List   Diagnosis Date Noted  . Pharyngitis 11/15/2016  . Former cigarette smoker 07/29/2008  . Human immunodeficiency virus (HIV) disease (HCC) 04/06/2006  . Hepatitis C 04/06/2006  . Anxiety state 04/06/2006  . Polysubstance abuse (HCC) 04/06/2006  . Depression 04/06/2006  . DENTAL CARIES 04/06/2006  . ANAL FISSURE 04/06/2006    Patient's Medications  New Prescriptions   No medications on file  Previous Medications   DARUNAVIR-COBICISCTAT-EMTRICITABINE-TENOFOVIR ALAFENAMIDE (SYMTUZA) 800-150-200-10 MG TABS    Take 1 tablet by mouth daily with breakfast.   LORAZEPAM (ATIVAN) 2 MG TABLET    Take 1 tablet (2 mg total) by mouth 2 (two) times daily as needed. for anxiety   PAROXETINE (PAXIL) 40 MG TABLET    TAKE 1 TABLET BY MOUTH EVERY DAY   PAROXETINE (PAXIL) 40 MG TABLET    TAKE 1 TABLET BY MOUTH EVERY DAY   PITAVASTATIN CALCIUM 4 MG TABS    Take 1 tablet (4 mg total) by mouth daily. Study provided. May be placebo. DO NOT FILL  Modified Medications   No medications on file  Discontinued Medications   No medications on file    Allergies: Allergies  Allergen Reactions  . Sulfonamide Derivatives     REACTION: itching    Past Medical History: Past Medical History:  Diagnosis Date  . Anxiety   . Cellulitis 02/11/2014   LEFT ARM  . Depression   . Headache(784.0)   . HIV (human immunodeficiency virus infection) (HCC)     Social History: Social History   Socioeconomic History  . Marital status: Single    Spouse name: Not on file  . Number of children: Not on file  . Years of education: Not on file  . Highest education level: Not on file  Occupational History  . Not on file  Social Needs  . Financial resource strain: Not on file  . Food insecurity:    Worry: Not on file    Inability: Not on file  . Transportation needs:    Medical:  Not on file    Non-medical: Not on file  Tobacco Use  . Smoking status: Former Smoker    Packs/day: 0.10    Years: 30.00    Pack years: 3.00    Types: Cigarettes    Last attempt to quit: 11/19/2014    Years since quitting: 3.1  . Smokeless tobacco: Never Used  Substance and Sexual Activity  . Alcohol use: No    Alcohol/week: 0.0 standard drinks  . Drug use: No  . Sexual activity: Not Currently    Comment: condoms given  Lifestyle  . Physical activity:    Days per week: Not on file    Minutes per session: Not on file  . Stress: Not on file  Relationships  . Social connections:    Talks on phone: Not on file    Gets together: Not on file    Attends religious service: Not on file    Active member of club or organization: Not on file    Attends meetings of clubs or organizations: Not on file    Relationship status: Not on file  Other Topics Concern  . Not on file  Social History Narrative  . Not on file    Labs: Lab Results  Component Value Date   HIV1RNAQUANT 2,040 (H) 01/09/2018  HIV1RNAQUANT 339 (H) 10/05/2017   HIV1RNAQUANT <20 DETECTED (A) 06/27/2017   CD4TABS 260 (L) 10/05/2017   CD4TABS 300 (L) 06/27/2017   CD4TABS 128 (L) 03/14/2017    RPR and STI Lab Results  Component Value Date   LABRPR NON REAC 01/12/2017   LABRPR NON REAC 09/08/2015   LABRPR NON REAC 07/21/2014   LABRPR NON REAC 09/17/2013   LABRPR NON REAC 06/21/2012    No flowsheet data found.  Hepatitis B Lab Results  Component Value Date   HEPBSAB REACTIVE (A) 01/09/2018   HEPBSAG NON-REACTIVE 01/09/2018   HEPBCAB REACTIVE (A) 01/09/2018   Hepatitis C Lab Results  Component Value Date   HEPCAB NON-REACTIVE 01/09/2018   Hepatitis A No results found for: HAV Lipids: Lab Results  Component Value Date   CHOL 207 (H) 01/12/2017   TRIG 157 (H) 01/12/2017   HDL 31 (L) 01/12/2017   CHOLHDL 6.7 (H) 01/12/2017   VLDL 31 (H) 01/12/2017   LDLCALC 145 (H) 01/12/2017    Current HIV  Regimen: Symtuza  Assessment: Ryun is here today to see Dr. Orvan Falconer for his HIV infection. He has had a long history of adherence issues and not taking his ARTs correctly.  He was switched to [facility] for hopefully better adherence with a one pill once daily regimen.  He just had labs a few weeks ago that showed a HIV viral load of 2040. He has a calendar with him today that showed he has been taking the [place] every day since the middle of June, but his labs show otherwise.  He wants to switch to Prezcobix and Descovy, which is fine if he will atually take it.  He states he does take his ART with food at 11am every morning.  He will see Dr. Orvan Falconer again in 3 months.  Dr. Orvan Falconer was very direct with him today regarding adherence and resistance.  Plan: - Stop Symtuza - Start Prezcobix PO once daily with food - Start Descovy PO once daily - F/u with Dr. Orvan Falconer 11/12 at 930am  Fabienne Nolasco L. Lynnley Doddridge, PharmD, AAHIVP, CPP Infectious Diseases Clinical Pharmacist [facility] 01/25/2018, 10:54 AM

## 2018-02-13 ENCOUNTER — Encounter (INDEPENDENT_AMBULATORY_CARE_PROVIDER_SITE_OTHER): Payer: Self-pay | Admitting: *Deleted

## 2018-02-13 VITALS — BP 133/87 | HR 65 | Temp 97.5°F | Wt 177.0 lb

## 2018-02-13 DIAGNOSIS — Z006 Encounter for examination for normal comparison and control in clinical research program: Secondary | ICD-10-CM

## 2018-02-13 NOTE — Progress Notes (Signed)
Jordan Calderon is here for his entry visit for the Latitude study A5359. Eligibility was verified prior to enrollment into study. He will start descovy and prescobix daily for the first 4 weeks. We reviewed strategies to assist with adherence. States that he usually takes his medications every morning at 11am when he feeds his dogs. Instructed to take medication with food. He has a calendar to assist with his medication and appointment reminders. He did take his first dose of descovy and prescobix at today's visit with food. States that he is excited about being on the study and seems to be motivated about taking care of himself and taking his medication. He will return in 2 weeks for his next study visit.

## 2018-02-14 LAB — LIPID PANEL
CHOLESTEROL: 161 mg/dL (ref <200)
HDL: 31 mg/dL — AB (ref >40)
LDL CHOLESTEROL (CALC): 104 mg/dL — AB
Non-HDL Cholesterol (Calc): 130 mg/dL (calc) — ABNORMAL HIGH (ref <130)
Total CHOL/HDL Ratio: 5.2 (calc) — ABNORMAL HIGH (ref <5.0)
Triglycerides: 147 mg/dL (ref <150)

## 2018-02-14 LAB — COMPREHENSIVE METABOLIC PANEL
AG RATIO: 1.3 (calc) (ref 1.0–2.5)
ALBUMIN MSPROF: 4.4 g/dL (ref 3.6–5.1)
ALKALINE PHOSPHATASE (APISO): 88 U/L (ref 40–115)
ALT: 15 U/L (ref 9–46)
AST: 16 U/L (ref 10–35)
BILIRUBIN TOTAL: 0.5 mg/dL (ref 0.2–1.2)
BUN: 20 mg/dL (ref 7–25)
CALCIUM: 9.3 mg/dL (ref 8.6–10.3)
CO2: 27 mmol/L (ref 20–32)
Chloride: 105 mmol/L (ref 98–110)
Creat: 1.16 mg/dL (ref 0.70–1.33)
Globulin: 3.4 g/dL (calc) (ref 1.9–3.7)
Glucose, Bld: 85 mg/dL (ref 65–99)
POTASSIUM: 5 mmol/L (ref 3.5–5.3)
SODIUM: 139 mmol/L (ref 135–146)
TOTAL PROTEIN: 7.8 g/dL (ref 6.1–8.1)

## 2018-02-14 LAB — TEST AUTHORIZATION

## 2018-02-14 LAB — BILIRUBIN, DIRECT: Bilirubin, Direct: 0.1 mg/dL (ref 0.0–0.2)

## 2018-02-20 ENCOUNTER — Other Ambulatory Visit: Payer: Self-pay | Admitting: Internal Medicine

## 2018-02-20 DIAGNOSIS — F4310 Post-traumatic stress disorder, unspecified: Secondary | ICD-10-CM

## 2018-02-27 ENCOUNTER — Encounter: Payer: Self-pay | Admitting: *Deleted

## 2018-03-05 ENCOUNTER — Encounter (INDEPENDENT_AMBULATORY_CARE_PROVIDER_SITE_OTHER): Payer: Self-pay | Admitting: *Deleted

## 2018-03-05 VITALS — BP 122/83 | HR 76 | Temp 98.0°F | Wt 172.5 lb

## 2018-03-05 DIAGNOSIS — Z006 Encounter for examination for normal comparison and control in clinical research program: Secondary | ICD-10-CM

## 2018-03-05 NOTE — Progress Notes (Signed)
Jordan Calderon is here for his week 2 visit for A5359, The Latitude Study. Doing great with his medication. Using a calendar to assist with his dosing. States he has been taking his medication everyday at 11am when he feeds his dogs. Pill count done today. He was at 100% adherence with his ARV's.  He says that he did have some diarrhea last week which lasted about 24-36 hours but states that it was related to some "bad chicken" that he ate. No other concerns verbalized. [facility] He feels that this would be helpful. He will return on 9/24 for study and counseling visits.

## 2018-03-07 ENCOUNTER — Ambulatory Visit: Payer: Self-pay | Admitting: Internal Medicine

## 2018-03-13 ENCOUNTER — Ambulatory Visit: Payer: Self-pay | Admitting: Licensed Clinical Social Worker

## 2018-03-13 ENCOUNTER — Encounter: Payer: Self-pay | Admitting: *Deleted

## 2018-03-14 ENCOUNTER — Encounter (INDEPENDENT_AMBULATORY_CARE_PROVIDER_SITE_OTHER): Payer: Self-pay | Admitting: *Deleted

## 2018-03-14 ENCOUNTER — Ambulatory Visit: Payer: Self-pay | Admitting: Licensed Clinical Social Worker

## 2018-03-14 VITALS — BP 155/96 | HR 69 | Temp 97.9°F | Wt 169.8 lb

## 2018-03-14 DIAGNOSIS — Z006 Encounter for examination for normal comparison and control in clinical research program: Secondary | ICD-10-CM

## 2018-03-14 NOTE — Research (Signed)
Jordan Calderon is here for his week 4 visit for A5359 The Latitude study and month 28 Reprieve visit. Pill count done. He had 100% adherence with his ARV's. No new complaints or medications. He was emotional today, crying at times.  Scheduled to see [facility]ene KocherRegina later this afternoon.  He will return on 04/10/18 for his next study visit.

## 2018-03-16 LAB — CBC WITH DIFFERENTIAL/PLATELET
Basophils Absolute: 102 cells/uL (ref 0–200)
Basophils Relative: 1.7 %
EOS ABS: 228 {cells}/uL (ref 15–500)
Eosinophils Relative: 3.8 %
HEMATOCRIT: 39.2 % (ref 38.5–50.0)
HEMOGLOBIN: 13.5 g/dL (ref 13.2–17.1)
LYMPHS ABS: 1572 {cells}/uL (ref 850–3900)
MCH: 31 pg (ref 27.0–33.0)
MCHC: 34.4 g/dL (ref 32.0–36.0)
MCV: 90.1 fL (ref 80.0–100.0)
MPV: 9.2 fL (ref 7.5–12.5)
Monocytes Relative: 9.8 %
NEUTROS ABS: 3510 {cells}/uL (ref 1500–7800)
Neutrophils Relative %: 58.5 %
Platelets: 222 10*3/uL (ref 140–400)
RBC: 4.35 10*6/uL (ref 4.20–5.80)
RDW: 12.9 % (ref 11.0–15.0)
Total Lymphocyte: 26.2 %
WBC: 6 10*3/uL (ref 3.8–10.8)
WBCMIX: 588 {cells}/uL (ref 200–950)

## 2018-03-16 LAB — COMPREHENSIVE METABOLIC PANEL
AG Ratio: 1.2 (calc) (ref 1.0–2.5)
ALBUMIN MSPROF: 4.2 g/dL (ref 3.6–5.1)
ALKALINE PHOSPHATASE (APISO): 93 U/L (ref 40–115)
ALT: 14 U/L (ref 9–46)
AST: 22 U/L (ref 10–35)
BILIRUBIN TOTAL: 0.4 mg/dL (ref 0.2–1.2)
BUN: 21 mg/dL (ref 7–25)
CALCIUM: 9.8 mg/dL (ref 8.6–10.3)
CO2: 29 mmol/L (ref 20–32)
Chloride: 102 mmol/L (ref 98–110)
Creat: 1.13 mg/dL (ref 0.70–1.33)
Globulin: 3.4 g/dL (calc) (ref 1.9–3.7)
Glucose, Bld: 88 mg/dL (ref 65–99)
POTASSIUM: 4.6 mmol/L (ref 3.5–5.3)
Sodium: 137 mmol/L (ref 135–146)
Total Protein: 7.6 g/dL (ref 6.1–8.1)

## 2018-03-16 LAB — HIV-1 RNA QUANT-NO REFLEX-BLD
HIV 1 RNA QUANT: 213 {copies}/mL — AB
HIV-1 RNA QUANT, LOG: 2.33 {Log_copies}/mL — AB

## 2018-03-16 LAB — BILIRUBIN, DIRECT: Bilirubin, Direct: 0.1 mg/dL (ref 0.0–0.2)

## 2018-04-10 ENCOUNTER — Ambulatory Visit: Payer: Self-pay | Admitting: Licensed Clinical Social Worker

## 2018-04-10 ENCOUNTER — Encounter: Payer: Self-pay | Admitting: *Deleted

## 2018-04-11 ENCOUNTER — Encounter (INDEPENDENT_AMBULATORY_CARE_PROVIDER_SITE_OTHER): Payer: Self-pay | Admitting: *Deleted

## 2018-04-11 VITALS — BP 138/88 | HR 78 | Temp 97.9°F | Wt 173.5 lb

## 2018-04-11 DIAGNOSIS — Z006 Encounter for examination for normal comparison and control in clinical research program: Secondary | ICD-10-CM

## 2018-04-11 NOTE — Research (Signed)
Jordan Calderon is here for his week 8 study visit for A5359, the Latitude Study for non-adherent patients with HIV. He is doing extremely well with taking medications with no missed doses over the past 30 days. He has been stressed out at home since his fencing was taken down to be replaced and the contractor has not returned to put the new one up. It is causing a lot of problems for him with his 3 dogs having to be taken out every few hours. He has been scheduled with Rene Kocher for counseling tomorrow at 11am. He did notice an increase in heartburn over the past month or so which he thinks may be related to his taking medicines regularly. He has Tums for that when it occurs and has been helping. I did instruct him to separate the Tums from the time he takes his ARVs.He will be returning in 4 weeks for the next visit.

## 2018-04-12 ENCOUNTER — Ambulatory Visit: Payer: Self-pay | Admitting: Licensed Clinical Social Worker

## 2018-04-13 ENCOUNTER — Other Ambulatory Visit: Payer: Self-pay | Admitting: Internal Medicine

## 2018-04-13 DIAGNOSIS — F411 Generalized anxiety disorder: Secondary | ICD-10-CM

## 2018-04-13 LAB — HIV-1 RNA QUANT-NO REFLEX-BLD
HIV 1 RNA QUANT: 125 {copies}/mL — AB
HIV-1 RNA QUANT, LOG: 2.1 {Log_copies}/mL — AB

## 2018-04-16 ENCOUNTER — Other Ambulatory Visit: Payer: Self-pay | Admitting: Internal Medicine

## 2018-04-16 DIAGNOSIS — F4310 Post-traumatic stress disorder, unspecified: Secondary | ICD-10-CM

## 2018-05-01 ENCOUNTER — Encounter: Payer: Self-pay | Admitting: Internal Medicine

## 2018-05-01 ENCOUNTER — Ambulatory Visit (INDEPENDENT_AMBULATORY_CARE_PROVIDER_SITE_OTHER): Payer: Medicare Other | Admitting: Internal Medicine

## 2018-05-01 ENCOUNTER — Ambulatory Visit (INDEPENDENT_AMBULATORY_CARE_PROVIDER_SITE_OTHER): Payer: Medicare Other | Admitting: Licensed Clinical Social Worker

## 2018-05-01 DIAGNOSIS — F411 Generalized anxiety disorder: Secondary | ICD-10-CM | POA: Diagnosis not present

## 2018-05-01 DIAGNOSIS — B20 Human immunodeficiency virus [HIV] disease: Secondary | ICD-10-CM

## 2018-05-01 DIAGNOSIS — F1910 Other psychoactive substance abuse, uncomplicated: Secondary | ICD-10-CM

## 2018-05-01 DIAGNOSIS — F331 Major depressive disorder, recurrent, moderate: Secondary | ICD-10-CM

## 2018-05-01 DIAGNOSIS — Z23 Encounter for immunization: Secondary | ICD-10-CM | POA: Diagnosis not present

## 2018-05-01 NOTE — Assessment & Plan Note (Signed)
His adherence has improved dramatically over the past 3 months and his infection is now back under good control.  He will continue Descovy and Prezcobix and follow-up in 3 months.  He received his influenza vaccine today.

## 2018-05-01 NOTE — Assessment & Plan Note (Signed)
He has improved commensurate with getting sober again.  I congratulated him on no drug use since his last visit.

## 2018-05-01 NOTE — Progress Notes (Signed)
Patient Active Problem List   Diagnosis Date Noted  . Human immunodeficiency virus (HIV) disease ([place]) 04/06/2006    Priority: High  . Former cigarette smoker 07/29/2008    Priority: Medium  . Anxiety state 04/06/2006    Priority: Medium  . Polysubstance abuse ([place]) 04/06/2006    Priority: Medium  . Depression 04/06/2006    Priority: Medium  . Hepatitis C 04/06/2006    Priority: Low  . Pharyngitis 11/15/2016  . DENTAL CARIES 04/06/2006  . ANAL FISSURE 04/06/2006    Patient's Medications  New Prescriptions   No medications on file  Previous Medications   DARUNAVIR-COBICISTAT (PREZCOBIX) 800-150 MG TABLET    Take 1 tablet by mouth daily with breakfast. Swallow whole. Do NOT crush, break or chew tablets. Take with food.   EMTRICITABINE-TENOFOVIR AF (DESCOVY) 200-25 MG TABLET    Take 1 tablet by mouth daily.   LORAZEPAM (ATIVAN) 2 MG TABLET    TAKE 1 TABLET TWICE DAILY AS NEEDED FOR ANXIETY   PAROXETINE (PAXIL) 40 MG TABLET    TAKE 1 TABLET BY MOUTH EVERY DAY   PITAVASTATIN CALCIUM 4 MG TABS    Take 1 tablet (4 mg total) by mouth daily. Study provided. May be placebo. DO NOT FILL  Modified Medications   No medications on file  Discontinued Medications   No medications on file    Subjective: Jordan Calderon is in for his routine HIV follow-up visit.  He has had no problems obtaining, taking or tolerating his Descovy or Prezcobix.  He has his calendar with him and shows me that he has not missed a single dose since restarting therapy in August.  He takes his medications at 11 AM.  He is feeling less anxious and depressed.  He is getting along with his sister, Barnett Applebaum, much better now.  He has tightened his cervical friends and gotten rid of the "rodents" in his life.  He denies any recent drug use and says that he drinks alcohol rarely and only in moderation recently.  He met with Lillie Fragmin, our behavioral health counselor, this morning.  Review of Systems: Review of Systems    Constitutional: Negative for chills, diaphoresis, fever, malaise/fatigue and weight loss.  HENT: Negative for sore throat.   Respiratory: Negative for cough, sputum production and shortness of breath.   Cardiovascular: Negative for chest pain.  Gastrointestinal: Negative for abdominal pain, diarrhea, heartburn, nausea and vomiting.  Genitourinary: Negative for dysuria and frequency.  Musculoskeletal: Negative for joint pain and myalgias.  Skin: Negative for rash.  Neurological: Negative for dizziness and headaches.  Psychiatric/Behavioral: Negative for depression and substance abuse. The patient is nervous/anxious.     Past Medical History:  Diagnosis Date  . Anxiety   . Cellulitis 02/11/2014   LEFT ARM  . Depression   . Headache(784.0)   . HIV (human immunodeficiency virus infection) ([place])     Social History   Tobacco Use  . Smoking status: Former Smoker    Packs/day: 0.10    Years: 30.00    Pack years: 3.00    Types: Cigarettes    Last attempt to quit: 11/19/2014    Years since quitting: 3.4  . Smokeless tobacco: Never Used  Substance Use Topics  . Alcohol use: No    Alcohol/week: 0.0 standard drinks  . Drug use: No    Family History  Adopted: Yes    Allergies  Allergen Reactions  . Sulfonamide Derivatives  REACTION: itching    Health Maintenance  Topic Date Due  . TETANUS/TDAP  07/14/1978  . COLONOSCOPY  07/14/2009  . INFLUENZA VACCINE  01/18/2018  . Hepatitis C Screening  Completed  . HIV Screening  Completed    Objective:  Vitals:   05/01/18 1010  BP: 109/71  Pulse: 92  Temp: 98 F (36.7 C)  TempSrc: Oral  Weight: 165 lb (74.8 kg)   Body mass index is 24.37 kg/m.  Physical Exam  Constitutional: He is oriented to person, place, and time.  He is talkative and in good spirits.  He has all of his medications with him and his medication calendar.  HENT:  Mouth/Throat: No oropharyngeal exudate.  Eyes: Conjunctivae are normal.   Cardiovascular: Normal rate, regular rhythm and normal heart sounds.  No murmur heard. Pulmonary/Chest: Effort normal and breath sounds normal.  Abdominal: Soft. He exhibits no mass. There is no tenderness.  Musculoskeletal: Normal range of motion.  Neurological: He is alert and oriented to person, place, and time.  Skin: No rash noted.  Psychiatric: He has a normal mood and affect.  He has good eye contact today.    Lab Results Lab Results  Component Value Date   WBC 6.0 03/14/2018   HGB 13.5 03/14/2018   HCT 39.2 03/14/2018   MCV 90.1 03/14/2018   PLT 222 03/14/2018    Lab Results  Component Value Date   CREATININE 1.13 03/14/2018   BUN 21 03/14/2018   NA 137 03/14/2018   K 4.6 03/14/2018   CL 102 03/14/2018   CO2 29 03/14/2018    Lab Results  Component Value Date   ALT 14 03/14/2018   AST 22 03/14/2018   ALKPHOS 75 12/01/2016   BILITOT 0.4 03/14/2018    Lab Results  Component Value Date   CHOL 161 02/13/2018   HDL 31 (L) 02/13/2018   LDLCALC 104 (H) 02/13/2018   TRIG 147 02/13/2018   CHOLHDL 5.2 (H) 02/13/2018   Lab Results  Component Value Date   LABRPR NON REAC 01/12/2017   HIV 1 RNA Quant (copies/mL)  Date Value  04/11/2018 125 (H)  03/14/2018 213 (H)  01/09/2018 2,040 (H)   CD4 T Cell Abs (/uL)  Date Value  10/05/2017 260 (L)  06/27/2017 300 (L)  03/14/2017 128 (L)     Problem List Items Addressed This Visit      High   Human immunodeficiency virus (HIV) disease ([place])    His adherence has improved dramatically over the past 3 months and his infection is now back under good control.  He will continue Descovy and Prezcobix and follow-up in 3 months.  He received his influenza vaccine today.        Medium   Polysubstance abuse ([place])    He has improved commensurate with getting sober again.  I congratulated him on no drug use since his last visit.      Anxiety state    His depression is in remission and his chronic anxiety is under much  better control.  He has a scheduled visit to follow-up with Rollene Fare soon.           Michel Bickers, MD [facility] [phone] pager   [phone] cell 05/01/2018, 10:52 AM

## 2018-05-01 NOTE — BH Specialist Note (Signed)
Integrated Behavioral Health Follow Up Visit  MRN: [id number] Name: Jordan Calderon  Number of Integrated Behavioral Health Clinician visits: 3/6 Session Start time: 9:52am  Session End time: 10:23am Total time: 30 minutes  Type of Service: Integrated Behavioral Health- Individual/Family Interpretor:No. Interpretor Name and Language: n/a  SUBJECTIVE: Jordan Calderon is a 57 y.o. male accompanied by self Patient reports the following symptoms/concerns: some anxiety (comes and goes), lack of interest in activities, low energy, tendency to isolate  OBJECTIVE: Mood: Depressed and Affect: Appropriate Risk of harm to self or others: No plan to harm self or others  LIFE CONTEXT: Patient reports that life is looking somewhat better for him. He states that he has been trying to "pick his battles" with sister and that this has created some level of detente between them, as has reducing their visits to monthly instead of every 2 weeks. Patient shares that he has been taking medication (Lorazepam) that has helped him to deal with his anxiety and this has helped his depression as well. He indicates that he has also begun following a schedule and is feeling much better about his life on many levels, but still has bad days when he is unable to motivate himself to do the things he knows he needs to.  GOALS ADDRESSED: Patient will: 1.  Reduce symptoms of: anxiety and depression   INTERVENTIONS: Interventions utilized:  Motivational Interviewing and Supportive Counseling  ASSESSMENT: Patient currently experiencing anxiety and some depressed mood, anhedonia, isolation, lack of energy. He shares how he has begun organizing and prioritizing his life, and this has had a positive impact for him. Counselor processed with patient the changes he has made, and how they have impacted his functioning. Patient shared that he has gotten himself on a schedule where he can associate taking medication with other  activities, and therefore he takes it more regularly. He also states that he has decided to deal differently with sister and brother-in-law. Counselor and patient explored the changes patient has made in that relationship. Patient shares that he is getting along better with sister and brother-in-law since he has recognized that it is easier to follow the rules she sets forward, and that this makes it less likely that she will set rules that are unreasonable.He shares that sister and brother-in-law are coming to visit him tomorrow, as they do each month, and he believes he will get quite anxious tonight anticipating that. Counselor guided patient in identifying his expectations of tomorrow. Counselor emphasized the importance of recognizing expectations and creating an outline in dealing with anxiety Counselor guided patient to identify what he wants to see change/happen in his life at this point. Patient states that he still has many days he cannot motivate himself to do the things he needs to, he still does not connect much with others, and he would like to be able to enjoy life more.   Patient may benefit from ongoing CBT sessions.  PLAN: 1. Follow up with behavioral health clinician on : 05/09/18   Angus Palmsegina Mandee Pluta, LCSW

## 2018-05-01 NOTE — Assessment & Plan Note (Signed)
His depression is in remission and his chronic anxiety is under much better control.  He has a scheduled visit to follow-up with Jordan Calderon soon.

## 2018-05-09 ENCOUNTER — Ambulatory Visit (INDEPENDENT_AMBULATORY_CARE_PROVIDER_SITE_OTHER): Payer: Medicare Other | Admitting: Licensed Clinical Social Worker

## 2018-05-09 ENCOUNTER — Encounter (INDEPENDENT_AMBULATORY_CARE_PROVIDER_SITE_OTHER): Payer: Self-pay | Admitting: *Deleted

## 2018-05-09 VITALS — BP 110/72 | HR 101 | Temp 98.0°F | Wt 172.2 lb

## 2018-05-09 DIAGNOSIS — F331 Major depressive disorder, recurrent, moderate: Secondary | ICD-10-CM

## 2018-05-09 DIAGNOSIS — Z006 Encounter for examination for normal comparison and control in clinical research program: Secondary | ICD-10-CM

## 2018-05-09 NOTE — Research (Signed)
Jordan Calderon is here for his week 12 visit for A5359, the Latitude Study for non-adherence. He has not missed any doses of his medication since his last visit. States that he "feels good". Has made some changes around the friends he associates with and is on a more routine schedule which he feels really helps with his medication adherence. He is to see Rene K[facility]herRegina this morning after his research visit. No new complaints or concerns. He will return in December for his next study visit.

## 2018-05-10 NOTE — BH Specialist Note (Signed)
Integrated Behavioral Health Follow Up Visit  MRN: [id number] Name: Ellis Parentsodd C Camper  Number of Integrated Behavioral Health Clinician visits: 4/6 Session Start time:  11:40am Session End time: 12:20pm Total time: 40 minutes  Type of Service: Integrated Behavioral Health- Individual/Family Interpretor:No. Interpretor Name and Language: n/a  SUBJECTIVE: Ellis Parentsodd C Pattillo is a 57 y.o. male accompanied by self Patient reports the following symptoms/concerns: anxiety (comes and goes), lack of interest in activities, low energy, loneliness  OBJECTIVE: Mood: Depressed and Affect: Constricted Risk of harm to self or others: No plan to harm self or others  LIFE CONTEXT: Patient reports that he is continuing to feel better about himself, and has made better decisions about the people he is surrounding himself with. He states he is also feeling better about the house now that his fence is finished and he can let his dogs out again. Patient has made the decision not to spend holidays with his sister and brother-in-law but instead with friends this year, and this has helped him feel good about the upcoming season.  GOALS ADDRESSED: Patient will: 1.  Reduce symptoms of: anxiety and depression  INTERVENTIONS: Interventions utilized:  Brief CBT and Supportive Counseling  ASSESSMENT: Patient currently experiencing loneliness, anxiety, anhedonia, and lack of energy and motivation. He shares that he is feeling better about himself for making difficult decisions regarding friends. Counselor commended patient for this. Patient identifies that it is what is best for him in the long run, though it is a struggle now due to feeling lonely. Counselor and patient explored how these friendships have impacted patient's functioning in various areas of life. Patient was able to identify several ways in which the friendships contributed to his anxiety and depression, including the thoughts he had about himself for  engaging in them. Counselor guided patient in reality testing these thoughts, and emphasized that he can choose his own friends and relationships without judging his worth as a person. Patient and counselor explored ways in which patient can meet needs for acceptance outside of the unhealthy friendships he has been engaging in . Counselor educated patient on how he feels and what he thinks about himself can impact how he interacts with others and the choices he makes in his life, and how this can become circular for him. Patient was able to use this information to create a plan on how to respond to temptations to break boundaries he has made with unhealthy friends. At this time he has them blocked on his phone. Patient expressed pride in himself for setting healthy limits, and counselor agreed with this.   Patient may benefit from ongoing CBT and Reality Therapy sessions  PLAN: 1. Follow up with behavioral health clinician on : 06/06/18 @ 10am   Angus Palmsegina Alexander, LCSW

## 2018-05-11 LAB — COMPLETE METABOLIC PANEL WITH GFR
AG RATIO: 1.1 (calc) (ref 1.0–2.5)
ALBUMIN MSPROF: 3.9 g/dL (ref 3.6–5.1)
ALT: 18 U/L (ref 9–46)
AST: 12 U/L (ref 10–35)
Alkaline phosphatase (APISO): 92 U/L (ref 40–115)
BILIRUBIN TOTAL: 0.4 mg/dL (ref 0.2–1.2)
BUN: 15 mg/dL (ref 7–25)
CO2: 29 mmol/L (ref 20–32)
CREATININE: 1.06 mg/dL (ref 0.70–1.33)
Calcium: 9.4 mg/dL (ref 8.6–10.3)
Chloride: 99 mmol/L (ref 98–110)
GFR, EST NON AFRICAN AMERICAN: 77 mL/min/{1.73_m2} (ref >=60)
GFR, Est African American: 89 mL/min/{1.73_m2} (ref >=60)
Globulin: 3.7 g/dL (calc) (ref 1.9–3.7)
Glucose, Bld: 107 mg/dL — ABNORMAL HIGH (ref 65–99)
Potassium: 4.7 mmol/L (ref 3.5–5.3)
SODIUM: 134 mmol/L — AB (ref 135–146)
TOTAL PROTEIN: 7.6 g/dL (ref 6.1–8.1)

## 2018-05-11 LAB — BILIRUBIN, DIRECT: Bilirubin, Direct: 0.1 mg/dL (ref 0.0–0.2)

## 2018-05-11 LAB — HIV-1 RNA QUANT-NO REFLEX-BLD
HIV 1 RNA QUANT: 78 {copies}/mL — AB
HIV-1 RNA QUANT, LOG: 1.89 {Log_copies}/mL — AB

## 2018-05-14 LAB — CBC
Absolute CD4: 113 uL — ABNORMAL LOW (ref 359–1519)
CD4%: 12.6 % — ABNORMAL LOW (ref 30.8–58.5)
CD4/CD8 Ratio: 0.25 — ABNORMAL LOW (ref 0.92–3.72)
CD8 T CELL SUPPRESSOR: 49.7 % — AB (ref 12–35.5)
CD8 T Cell Abs: 447 uL (ref 109–897)

## 2018-05-15 ENCOUNTER — Other Ambulatory Visit: Payer: Self-pay | Admitting: Internal Medicine

## 2018-05-15 DIAGNOSIS — F4310 Post-traumatic stress disorder, unspecified: Secondary | ICD-10-CM

## 2018-05-21 ENCOUNTER — Other Ambulatory Visit: Payer: Self-pay | Admitting: Internal Medicine

## 2018-05-21 DIAGNOSIS — F411 Generalized anxiety disorder: Secondary | ICD-10-CM

## 2018-06-06 ENCOUNTER — Ambulatory Visit (INDEPENDENT_AMBULATORY_CARE_PROVIDER_SITE_OTHER): Payer: Medicare Other | Admitting: Licensed Clinical Social Worker

## 2018-06-06 ENCOUNTER — Encounter (INDEPENDENT_AMBULATORY_CARE_PROVIDER_SITE_OTHER): Payer: Self-pay | Admitting: *Deleted

## 2018-06-06 VITALS — BP 116/84 | HR 92 | Temp 97.6°F | Wt 175.0 lb

## 2018-06-06 DIAGNOSIS — Z006 Encounter for examination for normal comparison and control in clinical research program: Secondary | ICD-10-CM

## 2018-06-06 DIAGNOSIS — F331 Major depressive disorder, recurrent, moderate: Secondary | ICD-10-CM

## 2018-06-06 NOTE — BH Specialist Note (Signed)
Integrated Behavioral Health Follow Up Visit  MRN: [id number] Name: Ellis Parentsodd C Paulhus  Number of Integrated Behavioral Health Clinician visits: 5/6 Session Start time: 10:03am  Session End time: 10:47am Total time: 40 minutes  Type of Service: Integrated Behavioral Health- Individual/Family Interpretor:No. Interpretor Name and Language: n/a  SUBJECTIVE: Ellis Parentsodd C Swenson is a 57 y.o. male accompanied by self Patient reports the following symptoms/concerns: anxiety, lack of energy/motivation, little interest in activities, tendency to isolate  OBJECTIVE: Mood: Depressed and Affect: Blunt Risk of harm to self or others: No plan to harm self or others  LIFE CONTEXT: Patient reports that he celebrated Thanksgiving alone with his dogs, but that he enjoyed it. He recently had a visit from sister and brother-in-law, which went very well. Patient explains that sister controlling his money is the best thing that could happen for him,because when he had control of it he was not making good financial decisions, and that he is content with her owning the house.  GOALS ADDRESSED: Patient will: 1.  Reduce symptoms of: depression   INTERVENTIONS: Interventions utilized:  Solution-Focused Strategies and Brief CBT  ASSESSMENT: Patient currently experiencing anhedonia, anxiety, low energy and sometimes low motivation, isolation.  He reports that his anxiety has decreased as he has learned to "pick [his] battles" with his sister. Patient shares that he has discovered the way to have a peaceful relationship with her is to take her perspective and anticipate the way she would want him to do things rather than waiting for her to tell him what to do. Counselor processed this with patient, and he stated that he has always hated being told what to do and tends to do the opposite, so avoiding his sister telling him what to do feels has a better outcome. Counselor commended patient on finding a coping technique, and  emphasized to him that spending all his time/energy focusing on what sister would/wouldn't want is not healthy for him. Patient states he is not spending nearly as much energy on taking her perspective as he was on recovering from the arguments when he did not do this. Counselor guided patient to identify ways that he focuses on his own wellbeing. Patient discussed things he no longer does, the absence of which make his life healthier. Counselor guided patient in reframing this thought process and encouraged him to to consider what he IS doing rather than what is ISN'T (for example, spending time with positive friends vs not spending time with negative people). Patient and counselor explored this as an example of self-talk.  Patient may benefit from ongoing CBT sessions.  PLAN: 1. Follow up with behavioral health clinician on : 07/04/18 @ 10am Angus Palmsegina Omolara Carol, LCSW

## 2018-06-06 NOTE — Research (Signed)
Jordan Calderon is here for his week 16 visit for A5359, the Latitude study. He has not missed any doses of his medication. Adherence is 100% per pill count. Continues to have problems with indigestion, mainly at night. Has been taking Tums with some relief. He knows not to take this around the same time as his ARV's. Encouraged him to keep a food diary to see if that might help determine what is contributing to his indigestion. He is scheduled to meet with Rene Kocheregina, our behavioral health counselor, today as well. He feels he is in a good place. States that his sister visited yesterday and that even went well. He will return in January for his next study visit.

## 2018-06-08 LAB — HIV-1 RNA QUANT-NO REFLEX-BLD
HIV 1 RNA Quant: 395 copies/mL — ABNORMAL HIGH
HIV-1 RNA Quant, Log: 2.6 Log copies/mL — ABNORMAL HIGH

## 2018-07-01 ENCOUNTER — Other Ambulatory Visit: Payer: Self-pay

## 2018-07-01 ENCOUNTER — Emergency Department (HOSPITAL_BASED_OUTPATIENT_CLINIC_OR_DEPARTMENT_OTHER): Payer: Medicare Other

## 2018-07-01 ENCOUNTER — Encounter (HOSPITAL_BASED_OUTPATIENT_CLINIC_OR_DEPARTMENT_OTHER): Payer: Self-pay | Admitting: Adult Health

## 2018-07-01 ENCOUNTER — Inpatient Hospital Stay (HOSPITAL_BASED_OUTPATIENT_CLINIC_OR_DEPARTMENT_OTHER)
Admission: EM | Admit: 2018-07-01 | Discharge: 2018-07-03 | DRG: 153 | Disposition: A | Discharge status: Home / Self Care | Payer: Medicare Other | Attending: Internal Medicine | Admitting: Internal Medicine

## 2018-07-01 DIAGNOSIS — R0789 Other chest pain: Secondary | ICD-10-CM | POA: Diagnosis present

## 2018-07-01 DIAGNOSIS — Z882 Allergy status to sulfonamides status: Secondary | ICD-10-CM | POA: Diagnosis not present

## 2018-07-01 DIAGNOSIS — F329 Major depressive disorder, single episode, unspecified: Secondary | ICD-10-CM | POA: Diagnosis present

## 2018-07-01 DIAGNOSIS — B20 Human immunodeficiency virus [HIV] disease: Secondary | ICD-10-CM

## 2018-07-01 DIAGNOSIS — J069 Acute upper respiratory infection, unspecified: Secondary | ICD-10-CM | POA: Diagnosis present

## 2018-07-01 DIAGNOSIS — Z21 Asymptomatic human immunodeficiency virus [HIV] infection status: Secondary | ICD-10-CM | POA: Diagnosis present

## 2018-07-01 DIAGNOSIS — B084 Enteroviral vesicular stomatitis with exanthem: Secondary | ICD-10-CM | POA: Diagnosis present

## 2018-07-01 DIAGNOSIS — F411 Generalized anxiety disorder: Secondary | ICD-10-CM | POA: Diagnosis present

## 2018-07-01 DIAGNOSIS — R509 Fever, unspecified: Secondary | ICD-10-CM | POA: Diagnosis not present

## 2018-07-01 DIAGNOSIS — R079 Chest pain, unspecified: Secondary | ICD-10-CM | POA: Diagnosis not present

## 2018-07-01 DIAGNOSIS — F32A Depression, unspecified: Secondary | ICD-10-CM | POA: Diagnosis present

## 2018-07-01 DIAGNOSIS — F419 Anxiety disorder, unspecified: Secondary | ICD-10-CM

## 2018-07-01 DIAGNOSIS — Z87891 Personal history of nicotine dependence: Secondary | ICD-10-CM

## 2018-07-01 DIAGNOSIS — R21 Rash and other nonspecific skin eruption: Secondary | ICD-10-CM | POA: Diagnosis present

## 2018-07-01 DIAGNOSIS — F1911 Other psychoactive substance abuse, in remission: Secondary | ICD-10-CM

## 2018-07-01 DIAGNOSIS — Z79899 Other long term (current) drug therapy: Secondary | ICD-10-CM | POA: Diagnosis not present

## 2018-07-01 DIAGNOSIS — A419 Sepsis, unspecified organism: Secondary | ICD-10-CM

## 2018-07-01 DIAGNOSIS — D649 Anemia, unspecified: Secondary | ICD-10-CM | POA: Diagnosis present

## 2018-07-01 LAB — INFLUENZA PANEL BY PCR (TYPE A & B)
Influenza A By PCR: NEGATIVE
Influenza B By PCR: NEGATIVE

## 2018-07-01 LAB — CBC WITH DIFFERENTIAL/PLATELET
Abs Immature Granulocytes: 0.06 10*3/uL (ref 0.00–0.07)
BASOS PCT: 1 %
Basophils Absolute: 0.1 10*3/uL (ref 0.0–0.1)
Eosinophils Absolute: 0.2 10*3/uL (ref 0.0–0.5)
Eosinophils Relative: 2 %
HCT: 37.2 % — ABNORMAL LOW (ref 39.0–52.0)
Hemoglobin: 11.7 g/dL — ABNORMAL LOW (ref 13.0–17.0)
Immature Granulocytes: 1 %
Lymphocytes Relative: 17 %
Lymphs Abs: 1.5 10*3/uL (ref 0.7–4.0)
MCH: 27.9 pg (ref 26.0–34.0)
MCHC: 31.5 g/dL (ref 30.0–36.0)
MCV: 88.6 fL (ref 80.0–100.0)
Monocytes Absolute: 0.9 10*3/uL (ref 0.1–1.0)
Monocytes Relative: 10 %
NEUTROS ABS: 6.2 10*3/uL (ref 1.7–7.7)
Neutrophils Relative %: 69 %
PLATELETS: 244 10*3/uL (ref 150–400)
RBC: 4.2 MIL/uL — ABNORMAL LOW (ref 4.22–5.81)
RDW: 14.9 % (ref 11.5–15.5)
WBC: 8.9 10*3/uL (ref 4.0–10.5)
nRBC: 0 % (ref 0.0–0.2)

## 2018-07-01 LAB — COMPREHENSIVE METABOLIC PANEL
ALT: 12 U/L (ref 0–44)
AST: 16 U/L (ref 15–41)
Albumin: 3 g/dL — ABNORMAL LOW (ref 3.5–5.0)
Alkaline Phosphatase: 123 U/L (ref 38–126)
Anion gap: 9 (ref 5–15)
BUN: 20 mg/dL (ref 6–20)
CO2: 22 mmol/L (ref 22–32)
Calcium: 9.2 mg/dL (ref 8.9–10.3)
Chloride: 99 mmol/L (ref 98–111)
Creatinine, Ser: 0.87 mg/dL (ref 0.61–1.24)
GFR calc Af Amer: >60 mL/min (ref >60)
Glucose, Bld: 119 mg/dL — ABNORMAL HIGH (ref 70–99)
Potassium: 4.2 mmol/L (ref 3.5–5.1)
Sodium: 130 mmol/L — ABNORMAL LOW (ref 135–145)
Total Bilirubin: 0.5 mg/dL (ref 0.3–1.2)
Total Protein: 8.6 g/dL — ABNORMAL HIGH (ref 6.5–8.1)

## 2018-07-01 LAB — URINALYSIS, ROUTINE W REFLEX MICROSCOPIC
Bilirubin Urine: NEGATIVE
Glucose, UA: NEGATIVE mg/dL
Hgb urine dipstick: NEGATIVE
Ketones, ur: NEGATIVE mg/dL
Leukocytes, UA: NEGATIVE
Nitrite: NEGATIVE
PROTEIN: NEGATIVE mg/dL
Specific Gravity, Urine: 1.01 (ref 1.005–1.030)
pH: 6.5 (ref 5.0–8.0)

## 2018-07-01 LAB — I-STAT CG4 LACTIC ACID, ED
LACTIC ACID, VENOUS: 1.29 mmol/L (ref 0.5–1.9)
Lactic Acid, Venous: 1.35 mmol/L (ref 0.5–1.9)

## 2018-07-01 MED ORDER — ONDANSETRON HCL 4 MG PO TABS: 4.0000 mg | ORAL_TABLET | Freq: Four times a day (QID) | ORAL | Status: DC | PRN

## 2018-07-01 MED ORDER — CEFEPIME HCL 2 G IJ SOLR
INTRAMUSCULAR | Status: AC
  Filled 2018-07-01: qty 2

## 2018-07-01 MED ORDER — SODIUM CHLORIDE 0.9 % IV SOLN
2.0000 g | Freq: Two times a day (BID) | INTRAVENOUS | Status: DC
  Filled 2018-07-01: qty 2

## 2018-07-01 MED ORDER — ACETAMINOPHEN 650 MG RE SUPP: 650.0000 mg | Freq: Four times a day (QID) | RECTAL | Status: DC | PRN

## 2018-07-01 MED ORDER — VANCOMYCIN HCL IN DEXTROSE 1-5 GM/200ML-% IV SOLN
1000.0000 mg | Freq: Two times a day (BID) | INTRAVENOUS | Status: DC
  Filled 2018-07-01: qty 200

## 2018-07-01 MED ORDER — ENOXAPARIN SODIUM 40 MG/0.4ML ~~LOC~~ SOLN
40.0000 mg | SUBCUTANEOUS | Status: DC
  Administered 2018-07-01 – 2018-07-02 (×2): 40 mg via SUBCUTANEOUS
  Filled 2018-07-01 (×2): qty 0.4

## 2018-07-01 MED ORDER — EMTRICITABINE-TENOFOVIR AF 200-25 MG PO TABS
1.0000 | ORAL_TABLET | Freq: Every day | ORAL | Status: DC
  Administered 2018-07-02 – 2018-07-03 (×2): 1 via ORAL
  Filled 2018-07-01 (×2): qty 1

## 2018-07-01 MED ORDER — ALBUTEROL SULFATE (2.5 MG/3ML) 0.083% IN NEBU: 2.5000 mg | INHALATION_SOLUTION | RESPIRATORY_TRACT | Status: DC | PRN

## 2018-07-01 MED ORDER — VANCOMYCIN HCL 500 MG IV SOLR
INTRAVENOUS | Status: AC
  Filled 2018-07-01: qty 500

## 2018-07-01 MED ORDER — VANCOMYCIN HCL IN DEXTROSE 1-5 GM/200ML-% IV SOLN
1000.0000 mg | Freq: Once | INTRAVENOUS | Status: AC
  Administered 2018-07-01: 1000 mg via INTRAVENOUS
  Filled 2018-07-01: qty 200

## 2018-07-01 MED ORDER — LORAZEPAM 1 MG PO TABS
2.0000 mg | ORAL_TABLET | Freq: Two times a day (BID) | ORAL | Status: DC | PRN
  Administered 2018-07-01 – 2018-07-03 (×2): 2 mg via ORAL
  Filled 2018-07-01 (×2): qty 2

## 2018-07-01 MED ORDER — SODIUM CHLORIDE 0.9 % IV SOLN
INTRAVENOUS | Status: DC
  Administered 2018-07-01: 09:00:00 via INTRAVENOUS

## 2018-07-01 MED ORDER — ACETAMINOPHEN 500 MG PO TABS
1000.0000 mg | ORAL_TABLET | Freq: Once | ORAL | Status: DC
  Filled 2018-07-01: qty 2

## 2018-07-01 MED ORDER — VANCOMYCIN HCL 500 MG IV SOLR
500.0000 mg | Freq: Once | INTRAVENOUS | Status: AC
  Administered 2018-07-01: 500 mg via INTRAVENOUS
  Filled 2018-07-01: qty 500

## 2018-07-01 MED ORDER — ONDANSETRON HCL 4 MG/2ML IJ SOLN: 4.0000 mg | Freq: Four times a day (QID) | INTRAMUSCULAR | Status: DC | PRN

## 2018-07-01 MED ORDER — METRONIDAZOLE IN NACL 5-0.79 MG/ML-% IV SOLN
500.0000 mg | Freq: Three times a day (TID) | INTRAVENOUS | Status: DC
  Administered 2018-07-01: 500 mg via INTRAVENOUS
  Filled 2018-07-01: qty 100

## 2018-07-01 MED ORDER — TRAMADOL HCL 50 MG PO TABS
50.0000 mg | ORAL_TABLET | Freq: Four times a day (QID) | ORAL | Status: DC | PRN
  Administered 2018-07-02: 50 mg via ORAL
  Filled 2018-07-01: qty 1

## 2018-07-01 MED ORDER — SODIUM CHLORIDE 0.9 % IV SOLN
2.0000 g | Freq: Once | INTRAVENOUS | Status: AC
  Administered 2018-07-01: 2 g via INTRAVENOUS
  Filled 2018-07-01: qty 2

## 2018-07-01 MED ORDER — DOXYCYCLINE HYCLATE 100 MG PO TABS
100.0000 mg | ORAL_TABLET | Freq: Two times a day (BID) | ORAL | Status: DC
  Administered 2018-07-01 – 2018-07-03 (×4): 100 mg via ORAL
  Filled 2018-07-01 (×5): qty 1

## 2018-07-01 MED ORDER — CALCIUM CARBONATE ANTACID 500 MG PO CHEW
400.0000 mg | CHEWABLE_TABLET | Freq: Three times a day (TID) | ORAL | Status: DC | PRN
  Administered 2018-07-01: 400 mg via ORAL
  Filled 2018-07-01: qty 2

## 2018-07-01 MED ORDER — DARUNAVIR-COBICISTAT 800-150 MG PO TABS
1.0000 | ORAL_TABLET | Freq: Every day | ORAL | Status: DC
  Administered 2018-07-02 – 2018-07-03 (×2): 1 via ORAL
  Filled 2018-07-01 (×3): qty 1

## 2018-07-01 MED ORDER — ACETAMINOPHEN 325 MG PO TABS
650.0000 mg | ORAL_TABLET | Freq: Four times a day (QID) | ORAL | Status: DC | PRN
  Administered 2018-07-02 – 2018-07-03 (×4): 650 mg via ORAL
  Filled 2018-07-01 (×4): qty 2

## 2018-07-01 MED ORDER — SODIUM CHLORIDE 0.9 % IV SOLN
INTRAVENOUS | Status: DC
  Administered 2018-07-01: 21:00:00 via INTRAVENOUS
  Administered 2018-07-01: 500 mL via INTRAVENOUS
  Administered 2018-07-02 – 2018-07-03 (×2): via INTRAVENOUS

## 2018-07-01 MED ORDER — AZITHROMYCIN 500 MG PO TABS: 500.0000 mg | ORAL_TABLET | Freq: Every day | ORAL | Status: DC

## 2018-07-01 MED ORDER — PAROXETINE HCL 20 MG PO TABS
40.0000 mg | ORAL_TABLET | Freq: Every day | ORAL | Status: DC
  Administered 2018-07-02 – 2018-07-03 (×2): 40 mg via ORAL
  Filled 2018-07-01 (×2): qty 2

## 2018-07-01 MED ORDER — ACETAMINOPHEN 500 MG PO TABS
1000.0000 mg | ORAL_TABLET | Freq: Once | ORAL | Status: AC
  Administered 2018-07-01: 1000 mg via ORAL
  Filled 2018-07-01: qty 2

## 2018-07-01 NOTE — Progress Notes (Signed)
Patient trasfered from [facility] to [phone] via [facility]; alert and oriented x 4; no complaints of pain on left side of his body; IV saline locked in RAC and fluids running in RFA; skin - generalized rash. Orient patient to room and unit; gave patient care guide; instructed how to use the call bell and  fall risk precautions. Will continue to monitor the patient.

## 2018-07-01 NOTE — ED Provider Notes (Addendum)
[place] EMERGENCY DEPARTMENT Provider Note   CSN: [id number] Arrival date & time: 07/01/18  0744     History   Chief Complaint Chief Complaint  Patient presents with  . Weakness    HPI Jordan Calderon is a 58 y.o. male.  Patient with upper respiratory infection symptoms since the beginning of the month.  Things have been getting worse.  Felt as if he had fevers on and off but did not feel as if he had a fever today.  But we do have a documented fever here to 102.  Patient does have a history of HIV.  Followed by Dr. Megan Salon from infectious disease.  Patient is on experimental HIV antivirals.  Patient last seen by them in the office in October.  Patient also broke out with a rash which is all over his body including his palms.  Patient symptoms mostly have been upper respiratory in nature.  He is also in the past 2 weeks has had fatigue dizziness and weakness.  No abdominal pain no nausea vomiting or diarrhea.  No significant headache no neck stiffness.  Patient ill-appearing here today.     Past Medical History:  Diagnosis Date  . Anxiety   . Cellulitis 02/11/2014   LEFT ARM  . Depression   . Headache(784.0)   . HIV (human immunodeficiency virus infection) [facility])     Patient Active Problem List   Diagnosis Date Noted  . Pharyngitis 11/15/2016  . Former cigarette smoker 07/29/2008  . Human immunodeficiency virus (HIV) disease ([place]) 04/06/2006  . Hepatitis C 04/06/2006  . Anxiety state 04/06/2006  . Polysubstance abuse ([place]) 04/06/2006  . Depression 04/06/2006  . DENTAL CARIES 04/06/2006  . ANAL FISSURE 04/06/2006    Past Surgical History:  Procedure Laterality Date  . arm surgery    . HEMORRHOID SURGERY          Home Medications    Prior to Admission medications   Medication Sig Start Date End Date Taking? Authorizing Provider  darunavir-cobicistat (PREZCOBIX) 800-150 MG tablet Take 1 tablet by mouth daily with breakfast. Swallow whole. Do NOT  crush, break or chew tablets. Take with food. 01/25/18   Michel Bickers, MD  emtricitabine-tenofovir AF (DESCOVY) 200-25 MG tablet Take 1 tablet by mouth daily. 01/25/18   Michel Bickers, MD  LORazepam (ATIVAN) 2 MG tablet TAKE 1 TABLET TWICE DAILY AS NEEDED FOR ANXIETY 05/21/18   Michel Bickers, MD  PARoxetine (PAXIL) 40 MG tablet TAKE 1 TABLET BY MOUTH EVERY DAY 05/15/18   Michel Bickers, MD  Pitavastatin Calcium 4 MG TABS Take 1 tablet (4 mg total) by mouth daily. Study provided. May be placebo. DO NOT FILL 11/19/15   Tommy Medal, Lavell Islam, MD    Family History Family History  Adopted: Yes    Social History Social History   Tobacco Use  . Smoking status: Former Smoker    Packs/day: 0.10    Years: 30.00    Pack years: 3.00    Types: Cigarettes    Last attempt to quit: 11/19/2014    Years since quitting: 3.6  . Smokeless tobacco: Never Used  Substance Use Topics  . Alcohol use: No    Alcohol/week: 0.0 standard drinks  . Drug use: No     Allergies   Sulfonamide derivatives   Review of Systems Review of Systems  Constitutional: Positive for fatigue. Negative for chills and fever.  HENT: Positive for congestion. Negative for rhinorrhea and sore throat.   Eyes: Negative  for redness and visual disturbance.  Respiratory: Positive for cough and shortness of breath.   Cardiovascular: Negative for chest pain and leg swelling.  Gastrointestinal: Negative for abdominal pain, diarrhea, nausea and vomiting.  Genitourinary: Negative for dysuria.  Musculoskeletal: Negative for back pain, neck pain and neck stiffness.  Skin: Positive for rash.  Allergic/Immunologic: Positive for immunocompromised state.  Neurological: Positive for weakness. Negative for dizziness, light-headedness and headaches.  Hematological: Does not bruise/bleed easily.  Psychiatric/Behavioral: Negative for confusion.     Physical Exam Updated Vital Signs BP 123/83 (BP Location: Right Arm)   Pulse 98   Temp 99.9  F (37.7 C) (Oral)   Resp (!) 25   Ht 1.753 m (5' 9")   Wt 72.6 kg   SpO2 96%   BMI 23.63 kg/m   Physical Exam Vitals signs and nursing note reviewed.  Constitutional:      Appearance: He is well-developed. He is ill-appearing and toxic-appearing.  HENT:     Head: Normocephalic and atraumatic.     Mouth/Throat:     Mouth: Mucous membranes are moist.     Pharynx: Oropharynx is clear.  Eyes:     Extraocular Movements: Extraocular movements intact.     Conjunctiva/sclera: Conjunctivae normal.     Pupils: Pupils are equal, round, and reactive to light.  Neck:     Musculoskeletal: Normal range of motion and neck supple. No neck rigidity.  Cardiovascular:     Rate and Rhythm: Normal rate and regular rhythm.     Heart sounds: No murmur.  Pulmonary:     Effort: Pulmonary effort is normal. No respiratory distress.     Breath sounds: Normal breath sounds. No wheezing or rales.  Abdominal:     Palpations: Abdomen is soft.     Tenderness: There is no abdominal tenderness.  Musculoskeletal: Normal range of motion.        General: No swelling.  Skin:    General: Skin is warm and dry.     Capillary Refill: Capillary refill takes less than 2 seconds.     Findings: Rash present.     Comments: Patient with a whole body rash that does blanch.  Skin a papular in nature.  It is on the palms.  The left palm even has an area that looks like a little bit of an ulcer.  No vesicles.  No skin peeling.  Neurological:     General: No focal deficit present.     Mental Status: He is alert and oriented to person, place, and time.      ED Treatments / Results  Labs (all labs ordered are listed, but only abnormal results are displayed) Labs Reviewed  COMPREHENSIVE METABOLIC PANEL - Abnormal; Notable for the following components:      Result Value   Sodium 130 (*)    Glucose, Bld 119 (*)    Total Protein 8.6 (*)    Albumin 3.0 (*)    All other components within normal limits  CBC WITH  DIFFERENTIAL/PLATELET - Abnormal; Notable for the following components:   RBC 4.20 (*)    Hemoglobin 11.7 (*)    HCT 37.2 (*)    All other components within normal limits  CULTURE, BLOOD (ROUTINE X 2)  CULTURE, BLOOD (ROUTINE X 2)  URINE CULTURE  RPR  URINALYSIS, ROUTINE W REFLEX MICROSCOPIC  CD4/CD8 (T-HELPER/T-SUPPRESSOR CELL)  INFLUENZA PANEL BY PCR (TYPE A & B)  I-STAT CG4 LACTIC ACID, ED  I-STAT CG4 LACTIC ACID, ED  I-STAT  CG4 LACTIC ACID, ED  I-STAT CG4 LACTIC ACID, ED    EKG EKG Interpretation  Date/Time:  Sunday July 01 2018 07:55:20 EST Ventricular Rate:  103 PR Interval:    QRS Duration: 86 QT Interval:  327 QTC Calculation: 428 R Axis:   -30 Text Interpretation:  Sinus tachycardia Left axis deviation Abnormal R-wave progression, early transition Confirmed by Fredia Sorrow [phone]) on 07/01/2018 8:21:23 AM   Radiology Dg Chest 2 View  Result Date: 07/01/2018 CLINICAL DATA:  Fever, cough. EXAM: CHEST - 2 VIEW COMPARISON:  Radiograph of February 10, 2014. FINDINGS: The heart size and mediastinal contours are within normal limits. No pneumothorax or pleural effusion is noted. Left lung is clear. Minimal right basilar subsegmental atelectasis is noted. The visualized skeletal structures are unremarkable. IMPRESSION: Minimal right basilar subsegmental atelectasis. Electronically Signed   By: Marijo Conception, M.D.   On: 07/01/2018 09:15    Procedures Procedures (including critical care time)  .CRITICAL CARE Performed by: Fredia Sorrow Total critical care time: 30 minutes Critical care time was exclusive of separately billable procedures and treating other patients. Critical care was necessary to treat or prevent imminent or life-threatening deterioration. Critical care was time spent personally by me on the following activities: development of treatment plan with patient and/or surrogate as well as nursing, discussions with consultants, evaluation of patient's  response to treatment, examination of patient, obtaining history from patient or surrogate, ordering and performing treatments and interventions, ordering and review of laboratory studies, ordering and review of radiographic studies, pulse oximetry and re-evaluation of patient's condition.   Medications Ordered in ED Medications  0.9 %  sodium chloride infusion ( Intravenous New Bag/Given 07/01/18 0831)  metroNIDAZOLE (FLAGYL) IVPB 500 mg (500 mg Intravenous New Bag/Given 07/01/18 0916)  ceFEPIme (MAXIPIME) 2 g injection (has no administration in time range)  vancomycin (VANCOCIN) 500 mg in sodium chloride 0.9 % 100 mL IVPB (has no administration in time range)  vancomycin (VANCOCIN) IVPB 1000 mg/200 mL premix (0 mg Intravenous Stopped 07/01/18 1015)  ceFEPIme (MAXIPIME) 2 g in sodium chloride 0.9 % 100 mL IVPB (has no administration in time range)  ceFEPIme (MAXIPIME) 2 g in sodium chloride 0.9 % 100 mL IVPB (0 g Intravenous Stopped 07/01/18 0918)  vancomycin (VANCOCIN) IVPB 1000 mg/200 mL premix (0 mg Intravenous Stopped 07/01/18 1015)  acetaminophen (TYLENOL) tablet 1,000 mg (1,000 mg Oral Given 07/01/18 0835)     Initial Impression / Assessment and Plan / ED Course  I have reviewed the triage vital signs and the nursing notes.  Pertinent labs & imaging results that were available during my care of the patient were reviewed by me and considered in my medical decision making (see chart for details).    Patient's vital signs met septic criteria.  Patient had no normal lactic acid and did was not hypotensive so did not meet criteria for the 30 cc/kg fluid challenge.  Patient started on broad-spectrum antibiotics.  Blood cultures urine culture sent.  Urinalysis normal.  Lactic acid was actually less than 2.  Chest x-ray negative for pneumonia.  RPR sent.  CD4 count sent.  Influenza test sent.  Discussed with Dr. Megan Salon from infectious disease he agrees with the admission.  Discussed with  hospitalist at [facility] they will admit.  Currently patient's blood pressure is fine with systolics of 144.  Fever is improved from 102 down to 99 with Tylenol.  Respiratory rate still in the low 20s.  Oxygen saturations are normal in the mid  to upper 90 percentile.   Currently patient stable from a respiratory standpoint.  Awaiting transfer to [facility] for admission.   Final Clinical Impressions(s) / ED Diagnoses   Final diagnoses:  Fever, unspecified fever cause  Symptomatic HIV infection (HCC)  Sepsis, due to unspecified organism, unspecified whether acute organ dysfunction present [facility])    ED Discharge Orders    None       Fredia Sorrow, MD 07/01/18 1122    Fredia Sorrow, MD 07/01/18 1122

## 2018-07-01 NOTE — Progress Notes (Signed)
Pharmacy Antibiotic Note  Jordan Calderon is a 58 y.o. male admitted on 07/01/2018 with sepsis.  Pharmacy has been consulted for vancomycin and cefepime dosing, Flagyl per MD.  Tmax 102, LA 1.29, tachy, WBC wnl  Plan: Vancomycin 500mg  IV x1 in addition to 1000mg  IV given in ED, then 1000mg  IV every 12 hours (calc AUC 471, SCr 0.87) Cefepime 2g IV every 12 hours F/u renal function, Cx and clinical progression to narrow Vancomycin levels at steady state  Height: 5\' 9"  (175.3 cm) Weight: 160 lb (72.6 kg) IBW/kg (Calculated) : 70.7  Temp (24hrs), Avg:100.5 F (38.1 C), Min:98.9 F (37.2 C), Max:102 F (38.9 C)  No results for input(s): WBC, CREATININE, LATICACIDVEN, VANCOTROUGH, VANCOPEAK, VANCORANDOM, GENTTROUGH, GENTPEAK, GENTRANDOM, TOBRATROUGH, TOBRAPEAK, TOBRARND, AMIKACINPEAK, AMIKACINTROU, AMIKACIN in the last 168 hours.  CrCl cannot be calculated (Patient's most recent lab result is older than the maximum 21 days allowed.).    Allergies  Allergen Reactions  . Sulfonamide Derivatives     REACTION: itching    Antimicrobials this admission: Vanc 1/12>> Cefepime 1/12>> Flagyl 1/12>>  Dose adjustments this admission: n/a  Microbiology results: 1/12 BCx: sent 1/12 UCx: sent  Daylene Posey, PharmD Clinical Pharmacist Please check AMION for all [facility] Pharmacy numbers 07/01/2018 8:27 AM

## 2018-07-01 NOTE — Consult Note (Signed)
[facility] for Infectious Disease    Date of Admission:  07/01/2018   Total days of antibiotics 1              Reason for Consult: Acute upper respiratory infection with fever and rash    Referring Provider: Dr. Fredia Sorrow  Assessment: The cause of Jordan Calderon's acute febrile illness is not entirely clear.  He does appear to have an acute upper respiratory infection without pneumonia and now has a superimposed rash that involves the palms and soles of his feet.  I do not think this is likely to be hand-foot-and-mouth disease given the generalized nature of the rash and lack of oral ulcers.  His history and exam do not completely fit with secondary syphilis has been screened.  He may have erythema multiforme due to previous infection or medication.  I will check with our research unit tomorrow to see if he is on any new medications which could have provoked a hypersensitivity reaction.  For now I will treat with empiric azithromycin and continue Descovy and Prezcobix.  I will follow with you.  Plan: 1. Start azithromycin 2. Continue Descovy and Prezcobix 3. Results of blood cultures, RPR, CD4 count and HIV viral load  Active Problems:   Fever and chills   Maculopapular rash, generalized   Upper respiratory infection   Human immunodeficiency virus (HIV) disease (HCC)   Anxiety state   Depression   Normocytic anemia   Scheduled Meds: . ceFEPIme      . doxycycline  100 mg Oral Q12H  . enoxaparin (LOVENOX) injection  40 mg Subcutaneous Q24H  . vancomycin       Continuous Infusions: . sodium chloride 75 mL/hr at 07/01/18 1411  . sodium chloride     PRN Meds:.acetaminophen **OR** acetaminophen, albuterol, traMADol  HPI: Jordan Calderon is a 58 y.o. male who I have followed for over 25 years for HIV infection.  He has a long history of polysubstance abuse, anxiety and pression and has cycled in and out of care over the years.  He has recently come back into care and we  started Descovy Prezcobix with improvement in his viral load.  2 weeks ago he developed a sore right mandibular molar and sinus congestion associated with fever and sweats.  He has developed some dry cough and right flank and chest pain which pleuritic.  He notes some shortness of breath.  He then developed a diffuse maculopapular rash involving palms and soles of his feet. It is only mildly pruritic.  He has not had any sores in his mouth.  He has not had any nausea, vomiting, diarrhea or dysuria.  He denies any sexual activity in over 6 months.  He states that he has not been missing any doses of his antiretroviral medications.  He recently started the [employer] adherence study in our clinic.  I am not sure if he is on any new medications recently as part of the study.   Review of Systems: Review of Systems  Constitutional: Positive for chills, diaphoresis, fever and malaise/fatigue.  HENT: Positive for congestion. Negative for sore throat.   Respiratory: Positive for cough and shortness of breath. Negative for sputum production.   Cardiovascular: Positive for chest pain.  Gastrointestinal: Positive for abdominal pain. Negative for diarrhea, nausea and vomiting.  Genitourinary: Negative for dysuria.  Skin: Positive for rash.  Psychiatric/Behavioral: The patient is nervous/anxious.     Past Medical History:  Diagnosis  Date  . Anxiety   . Cellulitis 02/11/2014   LEFT ARM  . Depression   . Headache(784.0)   . HIV (human immunodeficiency virus infection) ([place])     Social History   Tobacco Use  . Smoking status: Former Smoker    Packs/day: 0.10    Years: 30.00    Pack years: 3.00    Types: Cigarettes    Last attempt to quit: 11/19/2014    Years since quitting: 3.6  . Smokeless tobacco: Never Used  Substance Use Topics  . Alcohol use: No    Alcohol/week: 0.0 standard drinks  . Drug use: No    Family History  Adopted: Yes   Allergies  Allergen Reactions  . Sulfonamide  Derivatives     REACTION: itching    OBJECTIVE: Blood pressure 111/71, pulse (!) 108, temperature 98.8 F (37.1 C), temperature source Oral, resp. rate 20, height 5\' 9"  (1.753 m), weight 72.6 kg, SpO2 97 %.  Physical Exam Constitutional:      Comments: He is diaphoretic but alert and in no acute distress.  HENT:     Mouth/Throat:     Pharynx: No oropharyngeal exudate.     Comments: No ulcers noted. Eyes:     Conjunctiva/sclera: Conjunctivae normal.  Cardiovascular:     Rate and Rhythm: Normal rate and regular rhythm.     Heart sounds: No murmur.  Pulmonary:     Effort: Pulmonary effort is normal.     Breath sounds: Normal breath sounds.  Abdominal:     General: There is no distension.     Palpations: Abdomen is soft. There is no mass.     Tenderness: There is no abdominal tenderness.  Skin:    Findings: Rash present.     Comments: He has a diffuse red slightly raised rash over his trunk distal arms with dark circular patches that are slightly scaly on his palms and soles.  Psychiatric:        Mood and Affect: Mood normal.     Lab Results Lab Results  Component Value Date   WBC 8.9 07/01/2018   HGB 11.7 (L) 07/01/2018   HCT 37.2 (L) 07/01/2018   MCV 88.6 07/01/2018   PLT 244 07/01/2018    Lab Results  Component Value Date   CREATININE 0.87 07/01/2018   BUN 20 07/01/2018   NA 130 (L) 07/01/2018   K 4.2 07/01/2018   CL 99 07/01/2018   CO2 22 07/01/2018    Lab Results  Component Value Date   ALT 12 07/01/2018   AST 16 07/01/2018   ALKPHOS 123 07/01/2018   BILITOT 0.5 07/01/2018    HIV 1 RNA Quant (copies/mL)  Date Value  06/06/2018 395 (H)  05/09/2018 78 (H)  04/11/2018 125 (H)   CD4 T Cell Abs (/uL)  Date Value  10/05/2017 260 (L)  06/27/2017 300 (L)  03/14/2017 128 (L)    Microbiology: No results found for this or any previous visit (from the past 240 hour(s)).  Michel Bickers, MD [facility] [place]  [facility] [phone] pager   [phone] cell 07/01/2018, 4:24 PM

## 2018-07-01 NOTE — ED Triage Notes (Signed)
Pt presents with 2 weeks of fatigue, dizziness and weakness. HE has been sick with an upper respiratory illness for a while. He is HIV positive. He has a plamer rash and rash to his soles of his feet, unsure when that began. His speech is slightly slurred and his ataxic with standing and walking. That is abnormal for him. He complains of pain tho the right side of his body. Skin warm to touch.

## 2018-07-01 NOTE — H&P (Signed)
History and Physical    Jordan Parentsodd C Degan GNF:[id number] DOB: 12-08-1960 DOA: 07/01/2018  PCP: System, Pcp Not In  Patient coming from: Home.  I have personally briefly reviewed patient's old medical records in Epic and care everywhere.     Chief Complaint: Cough, right-sided chest pain, rashes on his hands and feet.  HPI: Jordan Calderon is a 58 y.o. male with medical history significant of HIV with last CD4 count known 300.  Followed by infectious disease.  [facility] today with 2 weeks of not feeling well, tooth ache, sinus congestion, dry cough with right-sided chest pain and skin rashes.  According to the patient, it he started with tooth ache with no problem in the tooth, then he started having sinus congestion most on the right side with some drainage.  He started having dry cough with no sputum production.  He started having right-sided lateral chest pain, aggravated by movement and coughing, 5 out of 10, no radiation.  Since last week, he also started noticing rashes on his palms and feet and now all over the body as much as he can see.  There is no drainage.  No itching.  Patient does have fever and measured temperature 102 at home.  With all these complaints, he went to med Center today.  Case was discussed with infectious disease and sent here. ED Course: Patient was febrile with temperature of 102.  Chest x-ray was essentially normal.  Urinalysis was normal.  Flu swab was negative.  Blood cultures were drawn, patient was given broad-spectrum antibiotics with vancomycin and cefepime.  Case was discussed with infectious disease and patient was transferred to the hospital.  Examined patient along with infectious disease at the bedside.  Review of Systems: As per HPI otherwise 10 point review of systems negative.    Past Medical History:  Diagnosis Date  . Anxiety   . Cellulitis 02/11/2014   LEFT ARM  . Depression   . Headache(784.0)   . HIV (human immunodeficiency  virus infection) (HCC)     Past Surgical History:  Procedure Laterality Date  . arm surgery    . HEMORRHOID SURGERY       reports that he quit smoking about 3 years ago. His smoking use included cigarettes. He has a 3.00 pack-year smoking history. He has never used smokeless tobacco. He reports that he does not drink alcohol or use drugs.  Allergies  Allergen Reactions  . Sulfonamide Derivatives     REACTION: itching    Family History  Adopted: Yes     Prior to Admission medications   Medication Sig Start Date End Date Taking? Authorizing Provider  darunavir-cobicistat (PREZCOBIX) 800-150 MG tablet Take 1 tablet by mouth daily with breakfast. Swallow whole. Do NOT crush, break or chew tablets. Take with food. 01/25/18   Cliffton Astersampbell, John, MD  emtricitabine-tenofovir AF (DESCOVY) 200-25 MG tablet Take 1 tablet by mouth daily. 01/25/18   Cliffton Astersampbell, John, MD  LORazepam (ATIVAN) 2 MG tablet TAKE 1 TABLET TWICE DAILY AS NEEDED FOR ANXIETY Patient taking differently: Take 2 mg by mouth 2 (two) times daily as needed for anxiety.  05/21/18   Cliffton Astersampbell, John, MD  PARoxetine (PAXIL) 40 MG tablet TAKE 1 TABLET BY MOUTH EVERY DAY Patient taking differently: Take 40 mg by mouth daily.  05/15/18   Cliffton Astersampbell, John, MD  Pitavastatin Calcium 4 MG TABS Take 1 tablet (4 mg total) by mouth daily. Study provided. May be placebo. DO NOT FILL 11/19/15  Daiva Eves, Lisette Grinder, MD    Physical Exam: Vitals:   07/01/18 1230 07/01/18 1300 07/01/18 1400 07/01/18 1423  BP: 120/84 (!) 116/99 111/71 111/71  Pulse: (!) 115 (!) 113 (!) 112 (!) 108  Resp: 20   20  Temp:      TempSrc:      SpO2: 98% 97% 95% 97%  Weight:      Height:        Constitutional: NAD, calm, comfortable Vitals:   07/01/18 1230 07/01/18 1300 07/01/18 1400 07/01/18 1423  BP: 120/84 (!) 116/99 111/71 111/71  Pulse: (!) 115 (!) 113 (!) 112 (!) 108  Resp: 20   20  Temp:      TempSrc:      SpO2: 98% 97% 95% 97%  Weight:      Height:        Eyes: PERRL, lids and conjunctivae normal.  He is on room air.  He looks comfortable. ENMT: Mucous membranes are moist. Posterior pharynx clear of any exudate or lesions.Normal dentition.  Neck: normal, supple, no masses, no thyromegaly Respiratory: clear to auscultation bilaterally, no wheezing, no crackles. Normal respiratory effort. No accessory muscle use.  He has some reproducible pain on his right lateral chest.  No palpable fractures or hematoma. Cardiovascular: Regular rate and rhythm, no murmurs / rubs / gallops. No extremity edema. 2+ pedal pulses. No carotid bruits.  Abdomen: no tenderness, no masses palpated. No hepatosplenomegaly. Bowel sounds positive.  Musculoskeletal: no clubbing / cyanosis. No joint deformity upper and lower extremities. Good ROM, no contractures. Normal muscle tone.  Neurologic: CN 2-12 grossly intact. Sensation intact, DTR normal. Strength 5/5 in all 4.  Psychiatric: Normal judgment and insight. Alert and oriented x 3. Normal mood.  Skin: no rashes, lesions, ulcers. No induration Patient has diffuse maculopapular rashes, both palm and feet as well as on his torso.  Nonblanching.  No drainage.  Labs on Admission: I have personally reviewed following labs and imaging studies  CBC: Recent Labs  Lab 07/01/18 0829  WBC 8.9  NEUTROABS 6.2  HGB 11.7*  HCT 37.2*  MCV 88.6  PLT 244   Basic Metabolic Panel: Recent Labs  Lab 07/01/18 0829  NA 130*  K 4.2  CL 99  CO2 22  GLUCOSE 119*  BUN 20  CREATININE 0.87  CALCIUM 9.2   GFR: Estimated Creatinine Clearance: 92.6 mL/min (by C-G formula based on SCr of 0.87 mg/dL). Liver Function Tests: Recent Labs  Lab 07/01/18 0829  AST 16  ALT 12  ALKPHOS 123  BILITOT 0.5  PROT 8.6*  ALBUMIN 3.0*   No results for input(s): LIPASE, AMYLASE in the last 168 hours. No results for input(s): AMMONIA in the last 168 hours. Coagulation Profile: No results for input(s): INR, PROTIME in the last 168  hours. Cardiac Enzymes: No results for input(s): CKTOTAL, CKMB, CKMBINDEX, TROPONINI in the last 168 hours. BNP (last 3 results) No results for input(s): PROBNP in the last 8760 hours. HbA1C: No results for input(s): HGBA1C in the last 72 hours. CBG: No results for input(s): GLUCAP in the last 168 hours. Lipid Profile: No results for input(s): CHOL, HDL, LDLCALC, TRIG, CHOLHDL, LDLDIRECT in the last 72 hours. Thyroid Function Tests: No results for input(s): TSH, T4TOTAL, FREET4, T3FREE, THYROIDAB in the last 72 hours. Anemia Panel: No results for input(s): VITAMINB12, FOLATE, FERRITIN, TIBC, IRON, RETICCTPCT in the last 72 hours. Urine analysis:    Component Value Date/Time   COLORURINE YELLOW 07/01/2018 1032  APPEARANCEUR CLEAR 07/01/2018 1032   LABSPEC 1.010 07/01/2018 1032   PHURINE 6.5 07/01/2018 1032   GLUCOSEU NEGATIVE 07/01/2018 1032   HGBUR NEGATIVE 07/01/2018 1032   BILIRUBINUR NEGATIVE 07/01/2018 1032   KETONESUR NEGATIVE 07/01/2018 1032   PROTEINUR NEGATIVE 07/01/2018 1032   UROBILINOGEN 0.2 02/10/2014 1830   NITRITE NEGATIVE 07/01/2018 1032   LEUKOCYTESUR NEGATIVE 07/01/2018 1032    Radiological Exams on Admission: Dg Chest 2 View  Result Date: 07/01/2018 CLINICAL DATA:  Fever, cough. EXAM: CHEST - 2 VIEW COMPARISON:  Radiograph of February 10, 2014. FINDINGS: The heart size and mediastinal contours are within normal limits. No pneumothorax or pleural effusion is noted. Left lung is clear. Minimal right basilar subsegmental atelectasis is noted. The visualized skeletal structures are unremarkable. IMPRESSION: Minimal right basilar subsegmental atelectasis. Electronically Signed   By: Lupita RaiderJames  Green Jr, M.D.   On: 07/01/2018 09:15    EKG: Independently reviewed.  Sinus tachycardia.  No ST-T wave changes.  QTc is 420.  Assessment/Plan Principal Problem:   Maculopapular rash, generalized Active Problems:   Human immunodeficiency virus (HIV) disease (HCC)   Anxiety  state   Depression   Fever and chills   Normocytic anemia   Upper respiratory infection   Fever and chills, maculopapular rashes in a patient with HIV: Received multiple IV antibiotics in the ER. Cause unknown.  Blood cultures were drawn.  CD4 count drawn.  RPR was sent.  Seen by infectious disease.  Suggested upper respiratory tract infection coverage with narrow spectrum antibiotics. Azithromycin with interaction?  With study drugs.  Will start patient on doxycycline. We will continue HIV medications including Descovy and Prezcobix. Bronchodilator therapy, deep breathing exercises, adequate pain medications and chest PT therapy.  Anxiety and depression: Patient on long-term benzodiazepines that he will continue.  He is also on fluoxetine.  Right lateral chest wall pain: Musculoskeletal pain probably due to coughing.  Symptomatic treatment.  DVT prophylaxis: Lovenox. Code Status: Full code. Family Communication: No family present. Disposition Plan: Home. Consults called: Infectious disease. Admission status: None.   Dorcas CarrowKuber Tracye Szuch MD [facility] Pager [phone]  If 7PM-7AM, please contact night-coverage [email or web address] Password [email or web address]  07/01/2018, 5:54 PM

## 2018-07-02 LAB — URINE CULTURE: Culture: NO GROWTH

## 2018-07-02 LAB — CBC
HCT: 32.9 % — ABNORMAL LOW (ref 39.0–52.0)
Hemoglobin: 10.4 g/dL — ABNORMAL LOW (ref 13.0–17.0)
MCH: 27.4 pg (ref 26.0–34.0)
MCHC: 31.6 g/dL (ref 30.0–36.0)
MCV: 86.6 fL (ref 80.0–100.0)
Platelets: 201 10*3/uL (ref 150–400)
RBC: 3.8 MIL/uL — ABNORMAL LOW (ref 4.22–5.81)
RDW: 14.9 % (ref 11.5–15.5)
WBC: 7.9 10*3/uL (ref 4.0–10.5)
nRBC: 0 % (ref 0.0–0.2)

## 2018-07-02 LAB — COMPREHENSIVE METABOLIC PANEL
ALT: 14 U/L (ref 0–44)
AST: 18 U/L (ref 15–41)
Albumin: 2.6 g/dL — ABNORMAL LOW (ref 3.5–5.0)
Alkaline Phosphatase: 114 U/L (ref 38–126)
Anion gap: 6 (ref 5–15)
BUN: 13 mg/dL (ref 6–20)
CO2: 24 mmol/L (ref 22–32)
Calcium: 8.8 mg/dL — ABNORMAL LOW (ref 8.9–10.3)
Chloride: 104 mmol/L (ref 98–111)
Creatinine, Ser: 1.08 mg/dL (ref 0.61–1.24)
GFR calc non Af Amer: >60 mL/min (ref >60)
Glucose, Bld: 149 mg/dL — ABNORMAL HIGH (ref 70–99)
Potassium: 4.4 mmol/L (ref 3.5–5.1)
Sodium: 134 mmol/L — ABNORMAL LOW (ref 135–145)
Total Bilirubin: 0.8 mg/dL (ref 0.3–1.2)
Total Protein: 8.3 g/dL — ABNORMAL HIGH (ref 6.5–8.1)

## 2018-07-02 LAB — CD4/CD8 (T-HELPER/T-SUPPRESSOR CELL)
CD4 absolute: 20 /uL — ABNORMAL LOW (ref 500–1900)
CD4%: 300 % — ABNORMAL HIGH (ref 30.0–60.0)
CD8 T Cell Abs: 26 /uL — ABNORMAL LOW (ref 230–1000)
CD8tox: 400 % — ABNORMAL HIGH (ref 15.0–40.0)
Ratio: 0.75 — ABNORMAL LOW (ref 1.0–3.0)
Total lymphocyte count: 1500 /uL (ref 1000–4000)

## 2018-07-02 MED ORDER — OXYMETAZOLINE HCL 0.05 % NA SOLN
1.0000 | Freq: Two times a day (BID) | NASAL | Status: DC | PRN
  Filled 2018-07-02: qty 15

## 2018-07-02 NOTE — Progress Notes (Signed)
Patient ID: Jordan Calderon, male   DOB: 1961/04/06, 58 y.o.   MRN: [id number]         [facility]  Date of Admission:  07/01/2018   Total days of antibiotics 2        Day 1 doxycycline         ASSESSMENT: Jordan Calderon has an acute upper respiratory infection.  I recommend continuing doxycycline and we have added Afrin nasal spray for symptomatic relief.  I am not sure what has caused his rash but it seems to be resolving.  PLAN: 1. Continue doxycycline 2. And Afrin nasal spray 3. Continue Descovy and Prezcobix  Principal Problem:   Maculopapular rash, generalized Active Problems:   Fever and chills   Upper respiratory infection   Human immunodeficiency virus (HIV) disease (HCC)   Anxiety state   Depression   Normocytic anemia   Scheduled Meds: . darunavir-cobicistat  1 tablet Oral Q breakfast  . doxycycline  100 mg Oral Q12H  . emtricitabine-tenofovir AF  1 tablet Oral Daily  . enoxaparin (LOVENOX) injection  40 mg Subcutaneous Q24H  . PARoxetine  40 mg Oral Daily   Continuous Infusions: . sodium chloride 75 mL/hr at 07/02/18 1013   PRN Meds:.acetaminophen **OR** acetaminophen, albuterol, calcium carbonate, LORazepam, oxymetazoline, traMADol   SUBJECTIVE: Jordan Calderon is feeling a little bit better today.  He is still having a lot of pressure and pain under his right associated with sinus congestion.  He still has a nonproductive cough and some shortness of breath.  He has right chest wall pain that gets worse when coughing.  He says that he is not on any new medications and he denies missing doses of his antiretroviral medications recently.  Review of Systems: Review of Systems  Constitutional: Positive for diaphoresis and malaise/fatigue. Negative for chills and fever.  HENT: Positive for congestion and ear pain. Negative for sore throat.   Respiratory: Positive for cough and shortness of breath. Negative for sputum production.   Cardiovascular: Positive  for chest pain.  Skin: Positive for rash. Negative for itching.    Allergies  Allergen Reactions  . Sulfonamide Derivatives Itching    OBJECTIVE: Vitals:   07/01/18 1423 07/01/18 2307 07/02/18 0508 07/02/18 1354  BP: 111/71 115/82 127/88 122/82  Pulse: (!) 108 92 84 82  Resp: 20   18  Temp:  98.2 F (36.8 C) 98.2 F (36.8 C) 98.2 F (36.8 C)  TempSrc:  Oral Oral   SpO2: 97% 99% 97% 99%  Weight:      Height:       Body mass index is 23.63 kg/m.  Physical Exam Constitutional:      Comments: He appears tired but overall looks more comfortable than he was yesterday afternoon.  HENT:     Mouth/Throat:     Pharynx: No oropharyngeal exudate.     Comments: Ulcers. Eyes:     Conjunctiva/sclera: Conjunctivae normal.  Cardiovascular:     Rate and Rhythm: Normal rate and regular rhythm.     Heart sounds: No murmur.  Pulmonary:     Effort: Pulmonary effort is normal.     Breath sounds: Normal breath sounds.  Chest:    Abdominal:     Palpations: Abdomen is soft.     Tenderness: There is no abdominal tenderness.  Skin:    Comments: His truncal rash has faded considerably.  The dark spots on his palms are beginning to desquamate.  Psychiatric:  Mood and Affect: Mood normal.     Lab Results Lab Results  Component Value Date   WBC 7.9 07/02/2018   HGB 10.4 (L) 07/02/2018   HCT 32.9 (L) 07/02/2018   MCV 86.6 07/02/2018   PLT 201 07/02/2018    Lab Results  Component Value Date   CREATININE 1.08 07/02/2018   BUN 13 07/02/2018   NA 134 (L) 07/02/2018   K 4.4 07/02/2018   CL 104 07/02/2018   CO2 24 07/02/2018    Lab Results  Component Value Date   ALT 14 07/02/2018   AST 18 07/02/2018   ALKPHOS 114 07/02/2018   BILITOT 0.8 07/02/2018     Microbiology: Recent Results (from the past 240 hour(s))  Culture, blood (Routine X 2) w Reflex to ID Panel     Status: None (Preliminary result)   Collection Time: 07/01/18  8:15 AM  Result Value Ref Range Status     Specimen Description   Final    BLOOD BLOOD RIGHT FOREARM Performed at [facility], [address] [place], [place] [address]    Special Requests   Final    BOTTLES DRAWN AEROBIC AND ANAEROBIC Blood Culture results may not be optimal due to an excessive volume of blood received in culture bottles Performed at [facility], [address] [place], [place] [address]    Culture   Final    NO GROWTH < 24 HOURS Performed at [facility], [address], [place], [place] [address]    Report Status PENDING  Incomplete  Culture, blood (Routine X 2) w Reflex to ID Panel     Status: None (Preliminary result)   Collection Time: 07/01/18  8:18 AM  Result Value Ref Range Status   Specimen Description   Final    BLOOD RIGHT ANTECUBITAL Performed at [facility], [address] [place], [place] [address]    Special Requests   Final    BOTTLES DRAWN AEROBIC AND ANAEROBIC Blood Culture adequate volume Performed at [facility], [address] [place], [place] [address]    Culture   Final    NO GROWTH < 24 HOURS Performed at [facility], [address], [place], [place] [address]    Report Status PENDING  Incomplete  Urine Culture     Status: None   Collection Time: 07/01/18 10:32 AM  Result Value Ref Range Status   Specimen Description   Final    URINE, CLEAN CATCH Performed at [facility], [address] [place], [place] [address]    Special Requests   Final    NONE Performed at [facility], [address] [place], [place] [address]    Culture   Final    NO GROWTH Performed at [facility], [address], [place], [place] [address]    Report Status 07/02/2018 FINAL  Final    [name], MD [facility] [phone] pager   [phone] cell 07/02/2018, 2:51 PM

## 2018-07-02 NOTE — Progress Notes (Signed)
PROGRESS NOTE                                                                                                                                                                                                             Patient Demographics:    Jordan Calderon, is a 58 y.o. male, DOB - Oct 31, 1960, HQI:[id number]  Admit date - 07/01/2018   Admitting Physician Dorcas Carrow, MD  Outpatient Primary MD for the patient is System, Pcp Not In  LOS - 1  Chief Complaint  Patient presents with  . Weakness       Brief Narrative   Jordan Calderon is a 58 y.o. male with medical history significant of HIV with last CD4 count known 300.  Followed by infectious disease.  Patient went to [facility] ED today with 2 weeks of not feeling well, tooth ache, sinus congestion, dry cough with right-sided chest pain and skin rashes.  According to the patient, it he started with tooth ache with no problem in the tooth, then he started having sinus congestion most on the right side with some drainage.  He started noticing a macular rash on his palms and soles and little bit on his chest wall several days ago, these were non-itchy.  Came to the ER where he was diagnosed with febrile illness with a temp of 102, macular rash and admitted to the hospital for further care.   Subjective:    Jordan Calderon today has, No headache, No chest pain, No abdominal pain - No Nausea, No new weakness tingling or numbness, No Cough - SOB. Rash improving.   Assessment  & Plan :    Principal Problem:   Maculopapular rash, generalized Active Problems:   Human immunodeficiency virus (HIV) disease (HCC)   Anxiety state   Depression   Fever and chills   Normocytic anemia   Upper respiratory infection   1.  Febrile illness with macular rash which seems to be resolving on palms soles and trunk, non-itchy nonblanching.  Suspicious for viral exanthem, likely has viral URI versus sinus infection as well.  Does not appear to  be syphilis, no mucosal  involvement.  Negative, RPR pending, follow cultures continue doxycycline for suspected sinus infection viral versus bacterial.  Discussed the case with infectious disease Dr. Orvan Falconer.  2.  HIV.  CD4 count 20.  Following ID, continue home medications.    Family Communication  :  none  Code Status :  Full  Disposition Plan  :  Home 1-2 days  Consults  :  ID  Procedures  :    DVT Prophylaxis  :  Lovenox   Lab Results  Component Value Date   PLT 201 07/02/2018    Diet :  Diet Order            Diet regular Room service appropriate? Yes; Fluid consistency: Thin  Diet effective now               Inpatient Medications Scheduled Meds: . darunavir-cobicistat  1 tablet Oral Q breakfast  . doxycycline  100 mg Oral Q12H  . emtricitabine-tenofovir AF  1 tablet Oral Daily  . enoxaparin (LOVENOX) injection  40 mg Subcutaneous Q24H  . PARoxetine  40 mg Oral Daily   Continuous Infusions: . sodium chloride 75 mL/hr at 07/02/18 1013   PRN Meds:.acetaminophen **OR** acetaminophen, albuterol, calcium carbonate, LORazepam, oxymetazoline, traMADol  Antibiotics  :   Anti-infectives (From admission, onward)   Start     Dose/Rate Route Frequency Ordered Stop   07/02/18 1000  emtricitabine-tenofovir AF (DESCOVY) 200-25 MG per tablet 1 tablet     1 tablet Oral Daily 07/01/18 1838     07/02/18 0800  darunavir-cobicistat (PREZCOBIX) 800-150 MG per tablet 1 tablet    Note to Pharmacy:  Swallow whole. Do NOT crush, break or chew tablets. Take with food.     1 tablet Oral Daily with breakfast 07/01/18 1838     07/01/18 2200  vancomycin (VANCOCIN) IVPB 1000 mg/200 mL premix  Status:  Discontinued     1,000 mg 200 mL/hr over 60 Minutes Intravenous Every 12 hours 07/01/18 0926 07/01/18 1620   07/01/18 2100  ceFEPIme (MAXIPIME) 2 g in sodium chloride 0.9 % 100 mL IVPB  Status:  Discontinued     2 g 200 mL/hr over 30 Minutes Intravenous Every 12 hours 07/01/18 0926  07/01/18 1620   07/01/18 1630  azithromycin (ZITHROMAX) tablet 500 mg  Status:  Discontinued     500 mg Oral Daily 07/01/18 1619 07/01/18 1620   07/01/18 1630  doxycycline (VIBRA-TABS) tablet 100 mg     100 mg Oral Every 12 hours 07/01/18 1620     07/01/18 1022  vancomycin (VANCOCIN) 500 MG powder    Note to Pharmacy:  Bartholomew Crewsavis, Jennaya   : cabinet override      07/01/18 1022 07/01/18 2229   07/01/18 0930  vancomycin (VANCOCIN) 500 mg in sodium chloride 0.9 % 100 mL IVPB     500 mg 100 mL/hr over 60 Minutes Intravenous  Once 07/01/18 0926 07/01/18 1137   07/01/18 0830  ceFEPIme (MAXIPIME) 2 g in sodium chloride 0.9 % 100 mL IVPB     2 g 200 mL/hr over 30 Minutes Intravenous  Once 07/01/18 0820 07/01/18 0918   07/01/18 0830  metroNIDAZOLE (FLAGYL) IVPB 500 mg  Status:  Discontinued     500 mg 100 mL/hr over 60 Minutes Intravenous Every 8 hours 07/01/18 0820 07/01/18 1620   07/01/18 0830  vancomycin (VANCOCIN) IVPB 1000 mg/200 mL premix     1,000 mg 200 mL/hr over 60 Minutes Intravenous  Once 07/01/18 0820 07/01/18 1015   07/01/18 0824  ceFEPIme (MAXIPIME) 2 g injection    Note to Pharmacy:  Barnett Abueed, Cynthia   : cabinet override      07/01/18 0824 07/01/18 2029          Objective:   Vitals:   07/01/18 1423 07/01/18 2307 07/02/18 0508 07/02/18 1354  BP: 111/71 115/82 127/88 122/82  Pulse: (!) 108 92 84 82  Resp: 20   18  Temp:  98.2 F (36.8 C) 98.2 F (36.8 C) 98.2 F (36.8 C)  TempSrc:  Oral Oral   SpO2: 97% 99% 97% 99%  Weight:      Height:        Wt Readings from Last 3 Encounters:  07/01/18 72.6 kg  06/06/18 79.4 kg  05/09/18 78.1 kg     Intake/Output Summary (Last 24 hours) at 07/02/2018 1417 Last data filed at 07/01/2018 1822 Gross per 24 hour  Intake 233.31 ml  Output -  Net 233.31 ml     Physical Exam  Awake Alert, Oriented X 3, No new F.N deficits, Normal affect [place].AT,PERRAL Supple Neck,No JVD, No cervical lymphadenopathy appriciated.  Symmetrical  Chest wall movement, Good air movement bilaterally, CTAB RRR,No Gallops,Rubs or new Murmurs, No Parasternal Heave +ve B.Sounds, Abd Soft, No tenderness, No organomegaly appriciated, No rebound - guarding or rigidity. No Cyanosis, Clubbing or edema, Rash as below        Data Review:    CBC Recent Labs  Lab 07/01/18 0829 07/02/18 0418  WBC 8.9 7.9  HGB 11.7* 10.4*  HCT 37.2* 32.9*  PLT 244 201  MCV 88.6 86.6  MCH 27.9 27.4  MCHC 31.5 31.6  RDW 14.9 14.9  LYMPHSABS 1.5  --   MONOABS 0.9  --   EOSABS 0.2  --   BASOSABS 0.1  --     Chemistries  Recent Labs  Lab 07/01/18 0829 07/02/18 0908  NA 130* 134*  K 4.2 4.4  CL 99 104  CO2 22 24  GLUCOSE 119* 149*  BUN 20 13  CREATININE 0.87 1.08  CALCIUM 9.2 8.8*  AST 16 18  ALT 12 14  ALKPHOS 123 114  BILITOT 0.5 0.8   ------------------------------------------------------------------------------------------------------------------ No results for input(s): CHOL, HDL, LDLCALC, TRIG, CHOLHDL, LDLDIRECT in the last 72 hours.  Lab Results  Component Value Date   HGBA1C 5.5 02/11/2014   ------------------------------------------------------------------------------------------------------------------ No results for input(s): TSH, T4TOTAL, T3FREE, THYROIDAB in the last 72 hours.  Invalid input(s): FREET3 ------------------------------------------------------------------------------------------------------------------ No results for input(s): VITAMINB12, FOLATE, FERRITIN, TIBC, IRON, RETICCTPCT in the last 72 hours.  Coagulation profile No results for input(s): INR, PROTIME in the last 168 hours.  No results for input(s): DDIMER in the last 72 hours.  Cardiac Enzymes No results for input(s): CKMB, TROPONINI, MYOGLOBIN in the last 168 hours.  Invalid input(s): CK ------------------------------------------------------------------------------------------------------------------ No results found for: BNP  Micro  Results Recent Results (from the past 240 hour(s))  Culture, blood (Routine X 2) w Reflex to ID Panel     Status: None (Preliminary result)   Collection Time: 07/01/18  8:15 AM  Result Value Ref Range Status   Specimen Description   Final    BLOOD BLOOD RIGHT FOREARM Performed at [facility] [place], [address] [place], [place] [address]    Special Requests   Final    BOTTLES DRAWN AEROBIC AND ANAEROBIC Blood Culture results may not be optimal due to an excessive volume of blood received in culture bottles Performed at [facility] [place], [address] [place], [place] [address]    Culture   Final    NO GROWTH < 24 HOURS Performed at [facility], [address] [place], [place] [address]    Report Status PENDING  Incomplete  Culture, blood (Routine X 2) w Reflex to ID Panel     Status: None (  Preliminary result)   Collection Time: 07/01/18  8:18 AM  Result Value Ref Range Status   Specimen Description   Final    BLOOD RIGHT ANTECUBITAL Performed at [facility], [address] [place], [place] [address]    Special Requests   Final    BOTTLES DRAWN AEROBIC AND ANAEROBIC Blood Culture adequate volume Performed at [facility], [address] [place], [place] [address]    Culture   Final    NO GROWTH < 24 HOURS Performed at [facility], [address], [place], [place] [address]    Report Status PENDING  Incomplete  Urine Culture     Status: None   Collection Time: 07/01/18 10:32 AM  Result Value Ref Range Status   Specimen Description   Final    URINE, CLEAN CATCH Performed at [facility], [address] [place], [place] [address]    Special Requests   Final    NONE Performed at [facility], [address] [place], [place] [address]    Culture   Final    NO GROWTH Performed at [facility], [address], [place], [place] [address]    Report Status 07/02/2018 FINAL  Final     Radiology Reports Dg Chest 2 View  Result Date: 07/01/2018 CLINICAL DATA:  Fever, cough. EXAM: CHEST - 2 VIEW COMPARISON:  Radiograph of Jordan 24, 2015. FINDINGS: The heart size and mediastinal contours are within normal limits. No pneumothorax or pleural effusion is noted. Left lung is clear. Minimal right basilar subsegmental atelectasis is noted. The visualized skeletal structures are unremarkable. IMPRESSION: Minimal right basilar subsegmental atelectasis. Electronically Signed   By: Lupita Raider, M.D.   On: 07/01/2018 09:15    Time Spent in minutes  30   Susa Raring M.D on 07/02/2018 at 2:17 PM  To page go to [email or web address] - password [facility]

## 2018-07-03 LAB — RPR, QUANT+TP ABS (REFLEX)
Rapid Plasma Reagin, Quant: 1:512 {titer} — ABNORMAL HIGH
T Pallidum Abs: REACTIVE — AB

## 2018-07-03 LAB — RPR: RPR Ser Ql: REACTIVE — AB

## 2018-07-03 MED ORDER — DOXYCYCLINE HYCLATE 100 MG PO TABS: 100.0000 mg | ORAL_TABLET | Freq: Two times a day (BID) | ORAL | 0 refills | Status: DC

## 2018-07-03 MED ORDER — LORATADINE 10 MG PO TABS: 10.0000 mg | ORAL_TABLET | Freq: Every day | ORAL | Status: DC

## 2018-07-03 NOTE — Discharge Summary (Signed)
Jordan Calderon UXN:[id number] DOB: [birth year] DOA: 07/01/2018  PCP: System, Pcp Not In  Admit date: 07/01/2018  Discharge date: 07/03/2018  Admitted From: Home   Disposition:  Home   Recommendations for Outpatient Follow-up:   Follow up with PCP in 1-2 weeks  PCP Please obtain BMP/CBC, 2 view CXR in 1week,  (see Discharge instructions)   PCP Please follow up on the following pending results: None   Home Health: None   Equipment/Devices: None  Consultations: ID Discharge Condition: Stable   CODE STATUS: Full   Diet Recommendation: Heart Healthy      Chief Complaint  Patient presents with  . Weakness     Brief history of present illness from the day of admission and additional interim summary    Jordan C McDanielis a 58 y.o.malewith medical history significant ofHIV with last CD4 count known 300. Followed by infectious disease. Patient went to [facility] ED today with 2 weeks of not feeling well, tooth ache, sinus congestion, dry cough with right-sided chest pain and skin rashes. According to the patient, it he started with tooth ache with no problem in the tooth, then he started having sinus congestion most on the right side with some drainage.  He started noticing a macular rash on his palms and soles and little bit on his chest wall several days ago, these were non-itchy.  Came to the ER where he was diagnosed with febrile illness with a temp of 102, macular rash and admitted to the hospital for further care.                                                                 Hospital Course    1.  Febrile illness with macular rash which seems to be resolving on palms soles and trunk, non-itchy nonblanching.  Suspicious for viral exanthem, likely has viral URI versus sinus infection as well.  Does not  appear to be syphilis, no mucosal  involvement.  Negative, RPR pending, so far blood cultures negative, he does have mild probably right-sided maxillary and frontal sinusitis for which she has been placed on doxycycline upon ID recommendation which she will get for another 5 days.  Discussed the case with infectious disease Dr. Orvan Falconer, he knows the patient well, cleared him for home discharge with outpatient ID follow-up with him in a week.  2.  HIV.  CD4 count 20.  Following ID, continue home medications.    Discharge diagnosis     Principal Problem:   Maculopapular rash, generalized Active Problems:   Human immunodeficiency virus (HIV) disease (HCC)   Anxiety state   Depression   Fever and chills   Normocytic anemia   Upper respiratory infection    Discharge instructions    Discharge Instructions    Diet - low  sodium heart healthy   Complete by:  As directed    Discharge instructions   Complete by:  As directed    Follow with Primary MD  in 7 days   Get CBC, CMP, 2 view Chest X ray -  checked  by Primary MD or SNF MD in 5-7 days    Activity: As tolerated with Full fall precautions use walker/cane & assistance as needed  Disposition Home   Diet: Heart Healthy   Special Instructions: If you have smoked or chewed Tobacco  in the last 2 yrs please stop smoking, stop any regular Alcohol  and or any Recreational drug use.  On your next visit with your primary care physician please Get Medicines reviewed and adjusted.  Please request your Prim.MD to go over all Hospital Tests and Procedure/Radiological results at the follow up, please get all Hospital records sent to your Prim MD by signing hospital release before you go home.  If you experience worsening of your admission symptoms, develop shortness of breath, life threatening emergency, suicidal or homicidal thoughts you must seek medical attention immediately by calling 911 or calling your MD immediately  if symptoms  less severe.  You Must read complete instructions/literature along with all the possible adverse reactions/side effects for all the Medicines you take and that have been prescribed to you. Take any new Medicines after you have completely understood and accpet all the possible adverse reactions/side effects.   Increase activity slowly   Complete by:  As directed       Discharge Medications   Allergies as of 07/03/2018      Reactions   Sulfonamide Derivatives Itching      Medication List    STOP taking these medications   darunavir-cobicistat 800-150 MG tablet Commonly known as:  PREZCOBIX   emtricitabine-tenofovir AF 200-25 MG tablet Commonly known as:  DESCOVY   Pitavastatin Calcium 4 MG Tabs     TAKE these medications   doxycycline 100 MG tablet Commonly known as:  VIBRA-TABS Take 1 tablet (100 mg total) by mouth every 12 (twelve) hours.   ibuprofen 200 MG tablet Commonly known as:  ADVIL,MOTRIN Take 800 mg by mouth every 6 (six) hours as needed (pain).   Investigational - Study Medication Take 2 tablets by mouth See admin instructions. Study name: [facility]  Additional study details:Phase 3 HIV research study administered by Misty StanleyLisa at Dr. Celesta AverJohn Campbell's office [phone]. Pt on Step 1 since August 2019: Prezcobix and Descovy - one tablet of each daily at 11am.  If HIV is suppressed as of 07/04/18 (week 20), pt will be randomly changed to step 2 on week 24.   Investigational - Study Medication Take 1 tablet by mouth See admin instructions. Study name: Repreve Additional study details: Long term Cholesterol study administered by Misty StanleyLisa at Dr. Celesta AverJohn Campbell's office [phone]. Double blind study: pt receives either Pitavastatin 4 mg or Placebo - 1 tablet daily at 11am   LORazepam 2 MG tablet Commonly known as:  ATIVAN TAKE 1 TABLET TWICE DAILY AS NEEDED FOR ANXIETY What changed:  See the new instructions.   PARoxetine 40 MG tablet Commonly known as:  PAXIL TAKE 1  TABLET BY MOUTH EVERY DAY What changed:  when to take this         Major procedures and Radiology Reports - PLEASE review detailed and final reports thoroughly  -         Dg Chest 2 View  Result Date: 07/01/2018 CLINICAL DATA:  Fever,  cough. EXAM: CHEST - 2 VIEW COMPARISON:  Radiograph of February 10, 2014. FINDINGS: The heart size and mediastinal contours are within normal limits. No pneumothorax or pleural effusion is noted. Left lung is clear. Minimal right basilar subsegmental atelectasis is noted. The visualized skeletal structures are unremarkable. IMPRESSION: Minimal right basilar subsegmental atelectasis. Electronically Signed   By: Lupita RaiderJames  Green Jr, M.D.   On: 07/01/2018 09:15    Micro Results     Recent Results (from the past 240 hour(s))  Culture, blood (Routine X 2) w Reflex to ID Panel     Status: None (Preliminary result)   Collection Time: 07/01/18  8:15 AM  Result Value Ref Range Status   Specimen Description   Final    BLOOD BLOOD RIGHT FOREARM Performed at [facility] [place], [address] [place], [place] [address]    Special Requests   Final    BOTTLES DRAWN AEROBIC AND ANAEROBIC Blood Culture results may not be optimal due to an excessive volume of blood received in culture bottles Performed at [facility] [place], [address] [place], [place] [address]    Culture   Final    NO GROWTH < 24 HOURS Performed at [facility], [address] [place], [place] [address]    Report Status PENDING  Incomplete  Culture, blood (Routine X 2) w Reflex to ID Panel     Status: None (Preliminary result)   Collection Time: 07/01/18  8:18 AM  Result Value Ref Range Status   Specimen Description   Final    BLOOD RIGHT ANTECUBITAL Performed at [facility] [place], [address] [place], [place] [address]    Special Requests   Final    BOTTLES DRAWN AEROBIC AND ANAEROBIC Blood Culture adequate volume Performed at [facility] [place], [address] [place], [place] [address]    Culture   Final    NO GROWTH < 24 HOURS Performed at [facility], [address] [place], [place] [address]    Report Status PENDING  Incomplete  Urine Culture     Status: None   Collection Time: 07/01/18 10:32 AM  Result Value Ref Range Status   Specimen Description   Final    URINE, CLEAN CATCH Performed at [facility] [place], [address] [place], [place] [address]    Special Requests   Final    NONE Performed at [facility] [place], [address] [place], [place] [address]    Culture   Final    NO GROWTH Performed at [facility], [address] [place], [place] [address]    Report Status 07/02/2018 FINAL  Final    Today   Subjective    August Albinoodd Reever today has no headache,no chest abdominal pain,no new weakness tingling or numbness, feels much better wants to go home today.     Objective   Blood pressure (!) 133/91, pulse 75, temperature 98.3 F (36.8 C), temperature source Oral, resp. rate 12, height 5\' 9"  (1.753 m), weight 79.3 kg, SpO2 99 %.   Intake/Output Summary (Last 24 hours) at 07/03/2018 1057 Last data filed at 07/03/2018 0000 Gross per 24 hour  Intake 2145.34 ml  Output -  Net 2145.34 ml    Exam  Awake Alert, Oriented x 3, No new F.N deficits, Normal affect [place].AT,PERRAL Supple Neck,No JVD, No cervical lymphadenopathy appriciated.  Symmetrical Chest wall movement, Good air movement  bilaterally, CTAB RRR,No Gallops,Rubs or new Murmurs, No Parasternal Heave +ve B.Sounds, Abd Soft, Non tender, No organomegaly appriciated, No rebound -guarding or rigidity. No Cyanosis, Clubbing or edema, rash macular non itching, non blanching improving as below       Data Review   CBC w Diff:  Lab Results  Component Value Date   WBC 7.9 07/02/2018   HGB 10.4 (L) 07/02/2018   HCT 32.9 (L) 07/02/2018   PLT 201 07/02/2018   LYMPHOPCT 17 07/01/2018   MONOPCT 10 07/01/2018   EOSPCT 2  07/01/2018   BASOPCT 1 07/01/2018    CMP:  Lab Results  Component Value Date   NA 134 (L) 07/02/2018   K 4.4 07/02/2018   CL 104 07/02/2018   CO2 24 07/02/2018   BUN 13 07/02/2018   CREATININE 1.08 07/02/2018   CREATININE 1.06 05/09/2018   PROT 8.3 (H) 07/02/2018   ALBUMIN 2.6 (L) 07/02/2018   BILITOT 0.8 07/02/2018   ALKPHOS 114 07/02/2018   AST 18 07/02/2018   ALT 14 07/02/2018  .   Total Time in preparing paper work, data evaluation and todays exam - 35 minutes  Susa Raring M.D on 07/03/2018 at 10:57 AM  [facility]   Office  [phone]

## 2018-07-03 NOTE — Discharge Instructions (Signed)
Follow with Primary MD  in 7 days  ° °Get CBC, CMP, 2 view Chest X ray -  checked  by Primary MD or SNF MD  in 5-7 days  ° °Activity: As tolerated with Full fall precautions use walker/cane & assistance as needed ° °Disposition Home   ° °Diet: Heart Healthy  °  °Special Instructions: If you have smoked or chewed Tobacco  in the last 2 yrs please stop smoking, stop any regular Alcohol  and or any Recreational drug use. ° °On your next visit with your primary care physician please Get Medicines reviewed and adjusted. ° °Please request your Prim.MD to go over all Hospital Tests and Procedure/Radiological results at the follow up, please get all Hospital records sent to your Prim MD by signing hospital release before you go home. ° °If you experience worsening of your admission symptoms, develop shortness of breath, life threatening emergency, suicidal or homicidal thoughts you must seek medical attention immediately by calling 911 or calling your MD immediately  if symptoms less severe. ° °You Must read complete instructions/literature along with all the possible adverse reactions/side effects for all the Medicines you take and that have been prescribed to you. Take any new Medicines after you have completely understood and accpet all the possible adverse reactions/side effects.  ° °  ° ° °  °

## 2018-07-03 NOTE — Progress Notes (Signed)
Patient ID: Jordan Calderon, male   DOB: Oct 19, 1960, 58 y.o.   MRN: [id number]         [facility] for Infectious Disease  Date of Admission:  07/01/2018   Total days of antibiotics 3        Day 2 doxycycline         ASSESSMENT: Kinsey has an acute upper respiratory infection.  He is beginning to feel better.  Recommend discharge home to complete 5 more days of empiric doxycycline.  I am not sure what has caused his rash but it seems to be resolving.  PLAN: 1. Continue doxycycline for 5 more days 2. Afrin nasal spray as needed for 4-5 more days 3. Continue Descovy and Prezcobix 4. He has follow-up with me in about 2 weeks  Principal Problem:   Maculopapular rash, generalized Active Problems:   Fever and chills   Upper respiratory infection   Human immunodeficiency virus (HIV) disease (HCC)   Anxiety state   Depression   Normocytic anemia   Scheduled Meds: . darunavir-cobicistat  1 tablet Oral Q breakfast  . doxycycline  100 mg Oral Q12H  . emtricitabine-tenofovir AF  1 tablet Oral Daily  . enoxaparin (LOVENOX) injection  40 mg Subcutaneous Q24H  . loratadine  10 mg Oral Daily  . PARoxetine  40 mg Oral Daily   Continuous Infusions: . sodium chloride 75 mL/hr at 07/03/18 0028   PRN Meds:.acetaminophen **OR** acetaminophen, albuterol, calcium carbonate, LORazepam, oxymetazoline, traMADol   SUBJECTIVE: Arad is feeling better today.  He has not had any more fever or sweats.  Sinus congestion is a little bit better after starting Afrin nasal spray.  Review of Systems: Review of Systems  Constitutional: Positive for diaphoresis and malaise/fatigue. Negative for chills and fever.  HENT: Positive for congestion. Negative for ear pain and sore throat.   Respiratory: Positive for cough and shortness of breath. Negative for sputum production.   Cardiovascular: Positive for chest pain.  Skin: Positive for rash. Negative for itching.    Allergies  Allergen Reactions  .  Sulfonamide Derivatives Itching    OBJECTIVE: Vitals:   07/02/18 1354 07/02/18 2140 07/03/18 0449 07/03/18 0620  BP: 122/82 134/85  (!) 133/91  Pulse: 82 79  75  Resp: 18 12    Temp: 98.2 F (36.8 C) 98.9 F (37.2 C)  98.3 F (36.8 C)  TempSrc:  Oral  Oral  SpO2: 99% 97%  99%  Weight:   79.3 kg   Height:       Body mass index is 25.82 kg/m.  Physical Exam Constitutional:      Comments: He is more talkative and in better spirits.  HENT:     Mouth/Throat:     Pharynx: No oropharyngeal exudate.  Eyes:     Conjunctiva/sclera: Conjunctivae normal.  Cardiovascular:     Rate and Rhythm: Normal rate and regular rhythm.     Heart sounds: No murmur.  Pulmonary:     Effort: Pulmonary effort is normal.     Breath sounds: Normal breath sounds.  Chest:    Abdominal:     Palpations: Abdomen is soft.     Tenderness: There is no abdominal tenderness.  Skin:    Comments: His truncal rash has faded considerably.  The dark spots on his palms are beginning to desquamate.  Psychiatric:        Mood and Affect: Mood normal.     Lab Results Lab Results  Component Value Date  WBC 7.9 07/02/2018   HGB 10.4 (L) 07/02/2018   HCT 32.9 (L) 07/02/2018   MCV 86.6 07/02/2018   PLT 201 07/02/2018    Lab Results  Component Value Date   CREATININE 1.08 07/02/2018   BUN 13 07/02/2018   NA 134 (L) 07/02/2018   K 4.4 07/02/2018   CL 104 07/02/2018   CO2 24 07/02/2018    Lab Results  Component Value Date   ALT 14 07/02/2018   AST 18 07/02/2018   ALKPHOS 114 07/02/2018   BILITOT 0.8 07/02/2018     Microbiology: Recent Results (from the past 240 hour(s))  Culture, blood (Routine X 2) w Reflex to ID Panel     Status: None (Preliminary result)   Collection Time: 07/01/18  8:15 AM  Result Value Ref Range Status   Specimen Description   Final    BLOOD BLOOD RIGHT FOREARM Performed at [facility], [address] [place], [place] [address]    Special Requests   Final      BOTTLES DRAWN AEROBIC AND ANAEROBIC Blood Culture results may not be optimal due to an excessive volume of blood received in culture bottles Performed at [facility], [address] [place], [place] [address]    Culture   Final    NO GROWTH < 24 HOURS Performed at [facility], [address], [place], [place] [address]    Report Status PENDING  Incomplete  Culture, blood (Routine X 2) w Reflex to ID Panel     Status: None (Preliminary result)   Collection Time: 07/01/18  8:18 AM  Result Value Ref Range Status   Specimen Description   Final    BLOOD RIGHT ANTECUBITAL Performed at [facility], [address] [place], [place] [address]    Special Requests   Final    BOTTLES DRAWN AEROBIC AND ANAEROBIC Blood Culture adequate volume Performed at [facility], [address] [place], [place] [address]    Culture   Final    NO GROWTH < 24 HOURS Performed at [facility], [address], [place], [place] [address]    Report Status PENDING  Incomplete  Urine Culture     Status: None   Collection Time: 07/01/18 10:32 AM  Result Value Ref Range Status   Specimen Description   Final    URINE, CLEAN CATCH Performed at [facility], [address] [place], [place] [address]    Special Requests   Final    NONE Performed at [facility], [address] [place], [place] [address]    Culture   Final    NO GROWTH Performed at [facility], [address] [place], [place] [address]    Report Status 07/02/2018 FINAL  Final    [name], MD [facility] [phone] pager   [phone] cell 07/03/2018, 11:25 AM

## 2018-07-04 ENCOUNTER — Encounter: Payer: Medicare Other | Admitting: *Deleted

## 2018-07-04 ENCOUNTER — Telehealth: Payer: Self-pay | Admitting: *Deleted

## 2018-07-04 ENCOUNTER — Encounter: Payer: Self-pay | Admitting: Internal Medicine

## 2018-07-04 ENCOUNTER — Ambulatory Visit: Payer: Medicare Other | Admitting: Licensed Clinical Social Worker

## 2018-07-04 DIAGNOSIS — A5149 Other secondary syphilitic conditions: Secondary | ICD-10-CM | POA: Insufficient documentation

## 2018-07-04 NOTE — Telephone Encounter (Addendum)
RN left message for patient to please call back regarding labs from the hospital.  RN shared information with Jordan Calderon, as patient was scheduled for a research visit today.  Jordan Calderon reached the patient, he stated he was unable to come in until Monday for treatment/research visit.  RN will test for gonorrhea/chlamydia per Dr Orvan Falconer. He is currently taking doxycycline 100 mg twice daily for 5 days. Please advise. 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 He needs benzathine penicillin 2.4 million units IM and screening for GC and chlamydia. Thanks.  Jonny Ruiz

## 2018-07-06 LAB — CULTURE, BLOOD (ROUTINE X 2)
Culture: NO GROWTH
Culture: NO GROWTH
Special Requests: ADEQUATE

## 2018-07-09 ENCOUNTER — Ambulatory Visit: Payer: Self-pay

## 2018-07-11 ENCOUNTER — Ambulatory Visit (INDEPENDENT_AMBULATORY_CARE_PROVIDER_SITE_OTHER): Payer: Medicare Other | Admitting: Licensed Clinical Social Worker

## 2018-07-11 ENCOUNTER — Other Ambulatory Visit (HOSPITAL_COMMUNITY)
Admission: RE | Admit: 2018-07-11 | Discharge: 2018-07-11 | Disposition: A | Discharge status: Home / Self Care | Payer: Medicare Other | Source: Ambulatory Visit | Attending: Internal Medicine | Admitting: Internal Medicine

## 2018-07-11 ENCOUNTER — Encounter (INDEPENDENT_AMBULATORY_CARE_PROVIDER_SITE_OTHER): Payer: Self-pay | Admitting: *Deleted

## 2018-07-11 ENCOUNTER — Ambulatory Visit (INDEPENDENT_AMBULATORY_CARE_PROVIDER_SITE_OTHER): Payer: Medicare Other | Admitting: *Deleted

## 2018-07-11 VITALS — BP 139/94 | HR 76 | Temp 98.0°F | Wt 175.0 lb

## 2018-07-11 DIAGNOSIS — D649 Anemia, unspecified: Secondary | ICD-10-CM

## 2018-07-11 DIAGNOSIS — A5149 Other secondary syphilitic conditions: Secondary | ICD-10-CM | POA: Insufficient documentation

## 2018-07-11 DIAGNOSIS — F331 Major depressive disorder, recurrent, moderate: Secondary | ICD-10-CM

## 2018-07-11 DIAGNOSIS — Z113 Encounter for screening for infections with a predominantly sexual mode of transmission: Secondary | ICD-10-CM

## 2018-07-11 DIAGNOSIS — Z006 Encounter for examination for normal comparison and control in clinical research program: Secondary | ICD-10-CM

## 2018-07-11 MED ORDER — PENICILLIN G BENZATHINE 1200000 UNIT/2ML IM SUSP
1.2000 10*6.[IU] | Freq: Once | INTRAMUSCULAR | Status: AC
  Administered 2018-07-11: 1.2 10*6.[IU] via INTRAMUSCULAR

## 2018-07-11 MED ORDER — STUDY - REPRIEVE A5332 - PITAVASTATIN 4MG OR PLACEBO TABLET (PI-VAN DAM): 4.0000 mg | ORAL_TABLET | Freq: Every day | ORAL | Status: DC

## 2018-07-11 MED ORDER — STUDY - A5359 (STEP 1) - EMTRICITABINE/TENOFOVIR ALAFENAMIDE (DESCOVY) TABLET (PI-VAN DAM): 1.0000 | ORAL_TABLET | Freq: Every day | ORAL | Status: DC

## 2018-07-11 MED ORDER — STUDY - A5359 (STEP 1) - DARUNAVIR/COBICISTAT (PREZCOBIX) TABLET (PI-VAN DAM): 1.0000 | ORAL_TABLET | Freq: Every day | ORAL | Status: DC
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
## 2018-07-11 NOTE — Research (Signed)
Jordan Calderon is here for his week 20 visit for A5359, the Latitude study for non-adherent patients with HIV and month 32 visit for Reprieve. States that he is feeling much better after his recent hospitalization for URI and rash. Completed his doxycycline over the weekend. He did have a positive RPR and received bicillin 2.4 million units IM today. Excellent adherence with his study medications. Study labs obtained. Hopefully his VL will be less than 50 copies/ml and he will be eligible to move to step 2.

## 2018-07-12 LAB — CYTOLOGY, (ORAL, ANAL, URETHRAL) ANCILLARY ONLY
Chlamydia: NEGATIVE
Chlamydia: NEGATIVE
Neisseria Gonorrhea: NEGATIVE
Neisseria Gonorrhea: NEGATIVE

## 2018-07-12 LAB — URINE CYTOLOGY ANCILLARY ONLY
Chlamydia: NEGATIVE
Neisseria Gonorrhea: NEGATIVE

## 2018-07-13 LAB — COMPREHENSIVE METABOLIC PANEL
AG Ratio: 0.8 (calc) — ABNORMAL LOW (ref 1.0–2.5)
ALT: 16 U/L (ref 9–46)
AST: 17 U/L (ref 10–35)
Albumin: 4.1 g/dL (ref 3.6–5.1)
Alkaline phosphatase (APISO): 131 U/L — ABNORMAL HIGH (ref 40–115)
BUN: 23 mg/dL (ref 7–25)
CALCIUM: 9.9 mg/dL (ref 8.6–10.3)
CHLORIDE: 103 mmol/L (ref 98–110)
CO2: 26 mmol/L (ref 20–32)
Creat: 1.03 mg/dL (ref 0.70–1.33)
GLOBULIN: 4.9 g/dL — AB (ref 1.9–3.7)
Glucose, Bld: 104 mg/dL — ABNORMAL HIGH (ref 65–99)
Potassium: 4.1 mmol/L (ref 3.5–5.3)
Sodium: 139 mmol/L (ref 135–146)
Total Bilirubin: 0.3 mg/dL (ref 0.2–1.2)
Total Protein: 9 g/dL — ABNORMAL HIGH (ref 6.1–8.1)

## 2018-07-13 LAB — CBC WITH DIFFERENTIAL/PLATELET
Absolute Monocytes: 449 cells/uL (ref 200–950)
BASOS PCT: 1.6 %
Basophils Absolute: 107 cells/uL (ref 0–200)
Eosinophils Absolute: 147 cells/uL (ref 15–500)
Eosinophils Relative: 2.2 %
HCT: 37.6 % — ABNORMAL LOW (ref 38.5–50.0)
Hemoglobin: 12.7 g/dL — ABNORMAL LOW (ref 13.2–17.1)
LYMPHS ABS: 1729 {cells}/uL (ref 850–3900)
MCH: 28.4 pg (ref 27.0–33.0)
MCHC: 33.8 g/dL (ref 32.0–36.0)
MCV: 84.1 fL (ref 80.0–100.0)
MPV: 8.8 fL (ref 7.5–12.5)
Monocytes Relative: 6.7 %
Neutro Abs: 4268 cells/uL (ref 1500–7800)
Neutrophils Relative %: 63.7 %
Platelets: 307 10*3/uL (ref 140–400)
RBC: 4.47 10*6/uL (ref 4.20–5.80)
RDW: 15.2 % — ABNORMAL HIGH (ref 11.0–15.0)
Total Lymphocyte: 25.8 %
WBC: 6.7 10*3/uL (ref 3.8–10.8)

## 2018-07-13 LAB — BILIRUBIN, DIRECT: Bilirubin, Direct: 0 mg/dL (ref 0.0–0.2)

## 2018-07-13 LAB — HIV-1 RNA QUANT-NO REFLEX-BLD
HIV 1 RNA QUANT: 427 {copies}/mL — AB
HIV-1 RNA QUANT, LOG: 2.63 {Log_copies}/mL — AB

## 2018-07-13 NOTE — Telephone Encounter (Signed)
Patient came 1/22 for syphilis treatment and STI testing.  Patient given 2.4 million units bicillin once.  STI testing results negative.  Andree Coss, RN

## 2018-07-15 ENCOUNTER — Other Ambulatory Visit: Payer: Self-pay | Admitting: Internal Medicine

## 2018-07-15 DIAGNOSIS — F411 Generalized anxiety disorder: Secondary | ICD-10-CM

## 2018-07-16 ENCOUNTER — Other Ambulatory Visit: Payer: Self-pay

## 2018-07-16 DIAGNOSIS — F411 Generalized anxiety disorder: Secondary | ICD-10-CM

## 2018-07-16 MED ORDER — LORAZEPAM 2 MG PO TABS: ORAL_TABLET | ORAL | 1 refills | Status: DC

## 2018-07-19 ENCOUNTER — Encounter (INDEPENDENT_AMBULATORY_CARE_PROVIDER_SITE_OTHER): Payer: Medicare Other | Admitting: *Deleted

## 2018-07-19 VITALS — BP 125/86 | HR 81 | Temp 97.7°F | Wt 179.0 lb

## 2018-07-19 DIAGNOSIS — Z006 Encounter for examination for normal comparison and control in clinical research program: Secondary | ICD-10-CM

## 2018-07-19 NOTE — Research (Signed)
Jordan Calderon is here for a repeat HIV RNA for the Latitude study. His VL from his week 20 visit was 427. We had him return to verify/confirm this VL. In order to move to step 2 of the study his VL needs to be <50 copies/ml by week 20.  He has had 100% adherence by pill count at each visit since starting on study. He stated that he is proud of himself and feels good about how well he has done with taking his medications. Support and encouragement provided.

## 2018-07-24 LAB — HIV RNA, QUANTITATIVE, PCR
HIV 1 RNA Quant: 350 copies/mL — ABNORMAL HIGH (ref ?–40)
Log copies/ml (ULTRA): 2.544 log copy/mL — ABNORMAL HIGH

## 2018-07-26 ENCOUNTER — Ambulatory Visit: Payer: Medicare Other | Admitting: Licensed Clinical Social Worker

## 2018-08-01 ENCOUNTER — Other Ambulatory Visit: Payer: Self-pay | Admitting: Internal Medicine

## 2018-08-01 ENCOUNTER — Encounter: Payer: Self-pay | Admitting: Internal Medicine

## 2018-08-01 ENCOUNTER — Encounter (INDEPENDENT_AMBULATORY_CARE_PROVIDER_SITE_OTHER): Payer: Self-pay | Admitting: *Deleted

## 2018-08-01 ENCOUNTER — Ambulatory Visit (INDEPENDENT_AMBULATORY_CARE_PROVIDER_SITE_OTHER): Payer: Medicare Other | Admitting: Internal Medicine

## 2018-08-01 ENCOUNTER — Ambulatory Visit (INDEPENDENT_AMBULATORY_CARE_PROVIDER_SITE_OTHER): Payer: Medicare Other | Admitting: Licensed Clinical Social Worker

## 2018-08-01 VITALS — BP 109/73 | HR 63 | Temp 98.1°F | Wt 170.0 lb

## 2018-08-01 DIAGNOSIS — F339 Major depressive disorder, recurrent, unspecified: Secondary | ICD-10-CM

## 2018-08-01 DIAGNOSIS — B20 Human immunodeficiency virus [HIV] disease: Secondary | ICD-10-CM

## 2018-08-01 DIAGNOSIS — A5149 Other secondary syphilitic conditions: Secondary | ICD-10-CM

## 2018-08-01 DIAGNOSIS — Z882 Allergy status to sulfonamides status: Secondary | ICD-10-CM

## 2018-08-01 DIAGNOSIS — Z8619 Personal history of other infectious and parasitic diseases: Secondary | ICD-10-CM | POA: Diagnosis not present

## 2018-08-01 DIAGNOSIS — F331 Major depressive disorder, recurrent, moderate: Secondary | ICD-10-CM

## 2018-08-01 DIAGNOSIS — Z79899 Other long term (current) drug therapy: Secondary | ICD-10-CM

## 2018-08-01 DIAGNOSIS — F419 Anxiety disorder, unspecified: Secondary | ICD-10-CM

## 2018-08-01 DIAGNOSIS — F330 Major depressive disorder, recurrent, mild: Secondary | ICD-10-CM

## 2018-08-01 DIAGNOSIS — Z87891 Personal history of nicotine dependence: Secondary | ICD-10-CM

## 2018-08-01 DIAGNOSIS — F1910 Other psychoactive substance abuse, uncomplicated: Secondary | ICD-10-CM | POA: Diagnosis not present

## 2018-08-01 DIAGNOSIS — Z006 Encounter for examination for normal comparison and control in clinical research program: Secondary | ICD-10-CM

## 2018-08-01 DIAGNOSIS — Z23 Encounter for immunization: Secondary | ICD-10-CM | POA: Diagnosis not present

## 2018-08-01 MED ORDER — EMTRICITABINE-TENOFOVIR AF 200-25 MG PO TABS: 1.0000 | ORAL_TABLET | Freq: Every day | ORAL | 11 refills | Status: DC

## 2018-08-01 MED ORDER — DARUNAVIR-COBICISTAT 800-150 MG PO TABS: 1.0000 | ORAL_TABLET | Freq: Every day | ORAL | 11 refills | Status: DC

## 2018-08-01 NOTE — Assessment & Plan Note (Signed)
I encouraged continued abstinence.

## 2018-08-01 NOTE — Assessment & Plan Note (Signed)
His chronic depression and anxiety is under reasonably good control.  I asked him to make a follow-up visit with our behavioral health counselor, Angus Palms.

## 2018-08-01 NOTE — Assessment & Plan Note (Signed)
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  I talked to him about the importance of limiting the number of partners he has, being very careful about partner selection and condom use.

## 2018-08-01 NOTE — Assessment & Plan Note (Signed)
His adherence has been good recently but his history over many decades is to fall in and out of care and on and off medications.  When he was hospitalized recently I believe that there was a mistake in the reporting of his CD4 count.  His absolute CD4 was reported to be 20 with a percentage of 300 which is possible.  I think the numbers were probably switched.  I will repeat his blood work today and see him back in 3 months.

## 2018-08-01 NOTE — Progress Notes (Signed)
Patient Active Problem List   Diagnosis Date Noted  . Former cigarette smoker 07/29/2008    Priority: Medium  . Human immunodeficiency virus (HIV) disease (HCC) 04/06/2006    Priority: Medium  . Anxiety state 04/06/2006    Priority: Medium  . Polysubstance abuse (HCC) 04/06/2006    Priority: Medium  . Depression 04/06/2006    Priority: Medium  . Hepatitis C 04/06/2006    Priority: Low  . 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 Normocytic anemia 07/01/2018  . DENTAL CARIES 04/06/2006  . ANAL FISSURE 04/06/2006    Patient's Medications  New Prescriptions   No medications on file  Previous Medications   DOXYCYCLINE (VIBRA-TABS) 100 MG TABLET    Take 1 tablet (100 mg total) by mouth every 12 (twelve) hours.   IBUPROFEN (ADVIL,MOTRIN) 200 MG TABLET    Take 800 mg by mouth every 6 (six) hours as needed (pain).   LORAZEPAM (ATIVAN) 2 MG TABLET    TAKE 1 TABLET TWICE DAILY AS NEEDED FOR ANXIETY   PAROXETINE (PAXIL) 40 MG TABLET    TAKE 1 TABLET BY MOUTH EVERY DAY   STUDY - A5359 (STEP 1) - DARUNAVIR/COBICISTAT (PREZCOBIX) TABLET    Take 1 tablet by mouth daily.   STUDY - A5359 (STEP 1) - EMTRICITABINE/TENOFOVIR ALAFENAMIDE (DESCOVY) TABLET    Take 1 tablet by mouth daily.   STUDY - REPRIEVE [phone] - PITAVASTATIN 4 MG OR PLACEBO TABLET    Take 1 tablet (4 mg total) by mouth daily.  Modified Medications   No medications on file  Discontinued Medications   No medications on file    Subjective: Jordan Calderon is in for his routine HIV follow-up visit.  He was hospitalized last month with a respiratory infection and a diffuse rash.  He was found to have secondary syphilis.  He has received his benzathine penicillin.  His respiratory symptoms has resolved and his rash faded away quickly after he was treated.  He has had no problems obtaining his Descovy or Prezcobix.  He denies missing any doses.  He says that he was last sexually active about 3 months ago.  He says that his mood is good and he  is currently getting along well with his sister, Jordan Calderon.  He has not had any recent drug use.  Review of Systems: Review of Systems  Constitutional: Negative for chills, diaphoresis, fever, malaise/fatigue and weight loss.  HENT: Negative for sore throat.   Respiratory: Negative for cough, sputum production and shortness of breath.   Cardiovascular: Negative for chest pain.  Gastrointestinal: Negative for abdominal pain, diarrhea, heartburn, nausea and vomiting.  Genitourinary: Negative for dysuria and frequency.  Musculoskeletal: Negative for joint pain and myalgias.  Skin: Negative for rash.  Neurological: Negative for dizziness and headaches.  Psychiatric/Behavioral: Positive for depression. Negative for substance abuse and suicidal ideas. The patient is nervous/anxious.     Past Medical History:  Diagnosis Date  . Anxiety   . Cellulitis 02/11/2014   LEFT ARM  . Depression   . Headache(784.0)   . HIV (human immunodeficiency virus infection) (HCC)     Social History   Tobacco Use  . Smoking status: Former Smoker    Packs/day: 0.10    Years: 30.00    Pack years: 3.00    Types: Cigarettes    Last attempt to quit: 11/19/2014    Years since quitting: 3.7  . Smokeless tobacco: Never Used  Substance Use Topics  . Alcohol  use: No    Alcohol/week: 0.0 standard drinks  . Drug use: No    Family History  Adopted: Yes    Allergies  Allergen Reactions  . Sulfonamide Derivatives Itching    Health Maintenance  Topic Date Due  . TETANUS/TDAP  07/14/1978  . COLONOSCOPY  07/14/2009  . INFLUENZA VACCINE  Completed  . Hepatitis C Screening  Completed  . HIV Screening  Completed    Objective:  Vitals:   08/01/18 0855  BP: 109/73  Pulse: 63  Temp: 98.1 F (36.7 C)  Weight: 170 lb (77.1 kg)   Body mass index is 25.1 kg/m.  Physical Exam Constitutional:      Comments: Slightly anxious as usual but in no distress.  HENT:     Mouth/Throat:     Pharynx: No  oropharyngeal exudate.  Eyes:     Conjunctiva/sclera: Conjunctivae normal.  Cardiovascular:     Rate and Rhythm: Normal rate and regular rhythm.     Heart sounds: No murmur.  Pulmonary:     Effort: Pulmonary effort is normal.     Breath sounds: Normal breath sounds.  Abdominal:     Palpations: Abdomen is soft. There is no mass.     Tenderness: There is no abdominal tenderness.  Musculoskeletal: Normal range of motion.  Skin:    Findings: No rash.  Neurological:     Mental Status: He is alert and oriented to person, place, and time.  Psychiatric:        Mood and Affect: Mood normal.     Lab Results Lab Results  Component Value Date   WBC 6.7 07/11/2018   HGB 12.7 (L) 07/11/2018   HCT 37.6 (L) 07/11/2018   MCV 84.1 07/11/2018   PLT 307 07/11/2018    Lab Results  Component Value Date   CREATININE 1.03 07/11/2018   BUN 23 07/11/2018   NA 139 07/11/2018   K 4.1 07/11/2018   CL 103 07/11/2018   CO2 26 07/11/2018    Lab Results  Component Value Date   ALT 16 07/11/2018   AST 17 07/11/2018   ALKPHOS 114 07/02/2018   BILITOT 0.3 07/11/2018    Lab Results  Component Value Date   CHOL 161 02/13/2018   HDL 31 (L) 02/13/2018   LDLCALC 104 (H) 02/13/2018   TRIG 147 02/13/2018   CHOLHDL 5.2 (H) 02/13/2018   Lab Results  Component Value Date   LABRPR Reactive (A) 07/01/2018   HIV 1 RNA Quant (copies/mL)  Date Value  07/19/2018 350 (H)  07/11/2018 427 (H)  06/06/2018 395 (H)   CD4 T Cell Abs (/uL)  Date Value  10/05/2017 260 (L)  06/27/2017 300 (L)  03/14/2017 128 (L)     Problem List Items Addressed This Visit      Medium   Polysubstance abuse (HCC)    I encouraged continued abstinence.      Human immunodeficiency virus (HIV) disease (HCC)    His adherence has been good recently but his history over many decades is to fall in and out of care and on and off medications.  When he was hospitalized recently I believe that there was a mistake in the  reporting of his CD4 count.  His absolute CD4 was reported to be 20 with a percentage of 300 which is possible.  I think the numbers were probably switched.  I will repeat his blood work today and see him back in 3 months.      Relevant Orders  T-helper cell (CD4)- ([facility] clinic only)   HIV-1 RNA quant-no reflex-bld   Depression    His chronic depression and anxiety is under reasonably good control.  I asked him to make a follow-up visit with our behavioral health counselor, Angus Palms.        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  I talked to him about the importance of limiting the number of partners he has, being very careful about partner selection and condom use.           [name], MD [facility] [phone] pager   [phone] cell 08/01/2018, 9:12 AM

## 2018-08-01 NOTE — BH Specialist Note (Signed)
Integrated Behavioral Health Follow Up Visit  MRN: [id number] Name: Jordan Calderon   Session Start time: 11:00am  Session End time: 11:40am Total time: 40 minutes  Type of Service: Integrated Behavioral Health- Individual/Family Interpretor:No. Interpretor Name and Language: n/a  SUBJECTIVE: Jordan Calderon is a 58 y.o. male accompanied by self Patient reports the following symptoms/concerns: anxiety/stress, fatigue, tendency to isolate   OBJECTIVE: Mood: Euthymic and Affect: Blunt Risk of harm to self or others: No plan to harm self or others  LIFE CONTEXT: Patient reports wanting to be more independent. This month he spent some of the money he received from being involved with a research study to buy his own household items so sister does not have to buy them for him. Patient states that he wants to be able to do this every month, both so that he can feel good about taking care of himself, and so sister and brother-in-law can come less often (since they will not have to bring him his supplies). Sister is not happy about this, according to patient, but they are talking through the possibility.  GOALS ADDRESSED: Patient will: 1.  Reduce symptoms of: depression   INTERVENTIONS: Interventions utilized:  Solution-Focused Strategies and Supportive Counseling  ASSESSMENT: Patient currently experiencing increased need for sleep, anxiety/stress, and a desire to isolate from friends. Counselor guided patient to discuss his anxiety. Patient states that sister and brother-in-law are coming to visit on Monday, and he has been cleaning a lot in preparation. He indicates that he told his sister he had done some projects around the house that he actually has not, and does not want her to be disappointed or angry when she visits so he is attempting to do them all by Monday. Counselor processed with patient why he felt the need to be dishonest with his sister. Patient indicates that he did not want  her to think he was wasting his time, and that he thought telling her he had done the projects would motivate him to actually do them. Counselor guided patient to think about what he may feel if sister gets here and he has not finished all the projects. Patient and counselor explored ways to deal with the stress and anxiety. Counselor encouraged patient to take care of himself instead of doing everything with the purpose of pleasing sister. Patient and counselor brainstormed ways of finding balance between showing up for self and for others.   Patient may benefit from CBT  sessions twice a month.  PLAN: 1. Patient did not have his calendar today; will call to schedule a follow up appointment   Angus Palms, LCSW

## 2018-08-01 NOTE — Addendum Note (Signed)
Addended by: Gerarda Fraction on: 08/01/2018 09:56 AM   Modules accepted: Orders

## 2018-08-01 NOTE — Research (Signed)
Jordan Calderon is here for his week 24 visit for A5359, the [employer] study. This will be his final study visit. Unfortunately, he is not eligible to move to step 2.  His last VL was 350 and needed to be <50 copies/ml. He was upset and disappointed about not being able to move forward in the study but stated that he feels good about how well he has done with taking his medications. He has been 100% adherent by pill count at each visit since starting on study. Explained to him that this is a huge accomplishment and that he needs to be proud of himself and how well he has done. He feels that he has developed some good strategies that will help him remain adherent with his medications. Seen by Dr. Orvan Falconer today and will continue on his current regimen, descovy and prezcobix.  Scheduled to see Rene Kocher for counseling later this morning.

## 2018-08-02 LAB — COMPREHENSIVE METABOLIC PANEL
AG Ratio: 1.1 (calc) (ref 1.0–2.5)
ALT: 15 U/L (ref 9–46)
AST: 18 U/L (ref 10–35)
Albumin: 4.1 g/dL (ref 3.6–5.1)
Alkaline phosphatase (APISO): 136 U/L (ref 35–144)
BUN: 20 mg/dL (ref 7–25)
CO2: 28 mmol/L (ref 20–32)
Calcium: 9.4 mg/dL (ref 8.6–10.3)
Chloride: 106 mmol/L (ref 98–110)
Creat: 1.17 mg/dL (ref 0.70–1.33)
Globulin: 3.6 g/dL (calc) (ref 1.9–3.7)
Glucose, Bld: 92 mg/dL (ref 65–99)
Potassium: 4.3 mmol/L (ref 3.5–5.3)
Sodium: 140 mmol/L (ref 135–146)
Total Bilirubin: 0.4 mg/dL (ref 0.2–1.2)
Total Protein: 7.7 g/dL (ref 6.1–8.1)

## 2018-08-02 LAB — LIPID PANEL
Cholesterol: 161 mg/dL (ref <200)
HDL: 29 mg/dL — ABNORMAL LOW (ref >=40)
LDL CHOLESTEROL (CALC): 109 mg/dL — AB
Non-HDL Cholesterol (Calc): 132 mg/dL (calc) — ABNORMAL HIGH (ref <130)
Total CHOL/HDL Ratio: 5.6 (calc) — ABNORMAL HIGH (ref <5.0)
Triglycerides: 120 mg/dL (ref <150)

## 2018-08-02 LAB — BILIRUBIN, DIRECT: Bilirubin, Direct: 0.1 mg/dL (ref 0.0–0.2)

## 2018-08-07 LAB — CBC
CD4%: 13.4 % — ABNORMAL LOW (ref 30.8–58.5)
CD4: 201 /uL — ABNORMAL LOW (ref 359–1519)
CD8 % Suppressor T Cell: 52.8 % — ABNORMAL HIGH (ref 12–35.5)
CD8: 792 % (ref 109–897)
HCT: 39 % (ref 38–51)
HEMOGLOBIN: 13.2 g/dL (ref 13–17.7)

## 2018-08-07 NOTE — Progress Notes (Signed)
Labs entered.

## 2018-08-15 ENCOUNTER — Other Ambulatory Visit: Payer: Self-pay | Admitting: Internal Medicine

## 2018-08-15 DIAGNOSIS — F411 Generalized anxiety disorder: Secondary | ICD-10-CM

## 2018-08-17 ENCOUNTER — Other Ambulatory Visit: Payer: Self-pay

## 2018-08-17 DIAGNOSIS — F411 Generalized anxiety disorder: Secondary | ICD-10-CM

## 2018-08-17 MED ORDER — LORAZEPAM 2 MG PO TABS: ORAL_TABLET | ORAL | 1 refills | Status: DC

## 2018-08-22 LAB — HIV RNA, QUANTITATIVE, PCR
HIV 1 RNA QUANT: 125 {copies}/mL — AB (ref ?–40)
Log copies/ml (ULTRA): 2.1 log cps/mL — ABNORMAL HIGH (ref ?–1.6)

## 2018-10-13 ENCOUNTER — Other Ambulatory Visit: Payer: Self-pay | Admitting: Internal Medicine

## 2018-10-13 DIAGNOSIS — F411 Generalized anxiety disorder: Secondary | ICD-10-CM

## 2018-10-13 DIAGNOSIS — F4310 Post-traumatic stress disorder, unspecified: Secondary | ICD-10-CM

## 2018-10-18 ENCOUNTER — Other Ambulatory Visit: Payer: Self-pay | Admitting: *Deleted

## 2018-10-18 ENCOUNTER — Telehealth: Payer: Self-pay | Admitting: *Deleted

## 2018-10-18 ENCOUNTER — Other Ambulatory Visit: Payer: Self-pay | Admitting: Internal Medicine

## 2018-10-18 NOTE — Telephone Encounter (Signed)
Received refill request from pharmacy. Routing to MD if ok to refill.  Valarie Cones, LPN

## 2018-10-18 NOTE — Telephone Encounter (Signed)
Patient called to ask for refills on Paxil and Ativan. He would like them sent to [facility] on [address]. Advised will have to ask the doctor and give him a call back once he responds.

## 2018-10-18 NOTE — Telephone Encounter (Signed)
He may have refills of both medications.

## 2018-10-29 ENCOUNTER — Telehealth: Payer: Self-pay | Admitting: Internal Medicine

## 2018-10-29 NOTE — Telephone Encounter (Signed)
COVID-19 Pre-Screening Questions: ° °Do you currently have a fever (>100 °F), chills or unexplained body aches? No  ° °Are you currently experiencing new cough, shortness of breath, sore throat, runny nose? No  °•  °Have you recently travelled outside the state of [place] in the last 14 days? No  °•  °Have you been in contact with someone that is currently pending confirmation of Covid19 testing or has been confirmed to have the Covid19 virus?  No  °

## 2018-10-30 ENCOUNTER — Ambulatory Visit (INDEPENDENT_AMBULATORY_CARE_PROVIDER_SITE_OTHER): Payer: Medicare Other | Admitting: Internal Medicine

## 2018-10-30 ENCOUNTER — Other Ambulatory Visit: Payer: Self-pay

## 2018-10-30 ENCOUNTER — Encounter: Payer: Self-pay | Admitting: Internal Medicine

## 2018-10-30 DIAGNOSIS — B20 Human immunodeficiency virus [HIV] disease: Secondary | ICD-10-CM | POA: Diagnosis present

## 2018-10-30 DIAGNOSIS — F1910 Other psychoactive substance abuse, uncomplicated: Secondary | ICD-10-CM | POA: Diagnosis not present

## 2018-10-30 DIAGNOSIS — F330 Major depressive disorder, recurrent, mild: Secondary | ICD-10-CM | POA: Diagnosis not present

## 2018-10-30 NOTE — Assessment & Plan Note (Signed)
His adherence has been much better over the past year and his infection has come under better control.  He will continue his current regimen, get lab work today and follow-up in 3 months.

## 2018-10-30 NOTE — Assessment & Plan Note (Signed)
He has not had any recent illicit drug use.

## 2018-10-30 NOTE — Progress Notes (Signed)
Patient Active Problem List   Diagnosis Date Noted  . Former cigarette smoker 07/29/2008    Priority: Medium  . Human immunodeficiency virus (HIV) disease (HCC) 04/06/2006    Priority: Medium  . Anxiety state 04/06/2006    Priority: Medium  . Polysubstance abuse (HCC) 04/06/2006    Priority: Medium  . Depression 04/06/2006    Priority: Medium  . Hepatitis C 04/06/2006    Priority: Low  . 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 Normocytic anemia 07/01/2018  . DENTAL CARIES 04/06/2006  . ANAL FISSURE 04/06/2006    Patient's Medications  New Prescriptions   No medications on file  Previous Medications   DARUNAVIR-COBICISTAT (PREZCOBIX) 800-150 MG TABLET    Take 1 tablet by mouth daily. Swallow whole. Do NOT crush, break or chew tablets. Take with food.   DOXYCYCLINE (VIBRA-TABS) 100 MG TABLET    Take 1 tablet (100 mg total) by mouth every 12 (twelve) hours.   EMTRICITABINE-TENOFOVIR AF (DESCOVY) 200-25 MG TABLET    Take 1 tablet by mouth daily.   IBUPROFEN (ADVIL,MOTRIN) 200 MG TABLET    Take 800 mg by mouth every 6 (six) hours as needed (pain).   LORAZEPAM (ATIVAN) 2 MG TABLET    TAKE 1 TABLET BY MOUTH TWICE DAILY AS NEEDED FOR ANXIETY   PAROXETINE (PAXIL) 40 MG TABLET    TAKE 1 TABLET BY MOUTH EVERY DAY   STUDY - REPRIEVE A5332 - PITAVASTATIN 4 MG OR PLACEBO TABLET    Take 1 tablet (4 mg total) by mouth daily.  Modified Medications   No medications on file  Discontinued Medications   No medications on file    Subjective: Jordan Calderon is in for his routine HIV follow-up visit.  He says he has had no problems obtaining, taking or tolerating his Descovy and Prezcobix.  He is keeping his calendar to remind him to take them.  He says that his relationship with his sister, [name], is much better and both of them are much happier.  He has not had any illicit drug use and has not been feeling as anxious or depressed as usual.  Review of Systems: Review of Systems  Constitutional:  Negative for chills, diaphoresis, fever, malaise/fatigue and weight loss.  HENT: Negative for sore throat.   Respiratory: Negative for cough, sputum production and shortness of breath.   Cardiovascular: Negative for chest pain.  Gastrointestinal: Negative for abdominal pain, diarrhea, heartburn, nausea and vomiting.  Genitourinary: Negative for dysuria and frequency.  Musculoskeletal: Negative for joint pain and myalgias.  Skin: Negative for rash.  Neurological: Negative for dizziness and headaches.  Psychiatric/Behavioral: Positive for depression. Negative for substance abuse. The patient is nervous/anxious.     Past Medical History:  Diagnosis Date  . Anxiety   . Cellulitis 02/11/2014   LEFT ARM  . Depression   . Headache(784.0)   . HIV (human immunodeficiency virus infection) (HCC)     Social History   Tobacco Use  . Smoking status: Former Smoker    Packs/day: 0.10    Years: 30.00    Pack years: 3.00    Types: Cigarettes    Last attempt to quit: 11/19/2014    Years since quitting: 3.9  . Smokeless tobacco: Never Used  Substance Use Topics  . Alcohol use: No    Alcohol/week: 0.0 standard drinks  . Drug use: No    Family History  Adopted: Yes    Allergies  Allergen Reactions  . Sulfonamide  Derivatives Itching    Health Maintenance  Topic Date Due  . TETANUS/TDAP  07/14/1978  . COLONOSCOPY  07/14/2009  . INFLUENZA VACCINE  01/19/2019  . Hepatitis C Screening  Completed  . HIV Screening  Completed    Objective:  Vitals:   10/30/18 0939  BP: 136/84  Pulse: 69  Temp: 98 F (36.7 C)  Weight: 179 lb (81.2 kg)  Height:  (1.753 m)   Body mass index is 26.43 kg/m.  Physical Exam Constitutional:      Comments: He is in good spirits today.  He has good eye contact.  HENT:     Mouth/Throat:     Pharynx: No oropharyngeal exudate.  Eyes:     Conjunctiva/sclera: Conjunctivae normal.  Cardiovascular:     Rate and Rhythm: Normal rate and regular  rhythm.     Heart sounds: No murmur.  Pulmonary:     Effort: Pulmonary effort is normal.     Breath sounds: Normal breath sounds.  Abdominal:     Palpations: Abdomen is soft. There is no mass.     Tenderness: There is no abdominal tenderness.  Musculoskeletal: Normal range of motion.  Skin:    Findings: No rash.  Neurological:     Mental Status: He is alert and oriented to person, place, and time.  Psychiatric:        Mood and Affect: Mood normal.     Lab Results Lab Results  Component Value Date   WBC 6.7 07/11/2018   HGB 12.7 (L) 07/11/2018   HCT 39 08/01/2018   MCV 84.1 07/11/2018   PLT 307 07/11/2018    Lab Results  Component Value Date   CREATININE 1.17 08/01/2018   BUN 20 08/01/2018   NA 140 08/01/2018   K 4.3 08/01/2018   CL 106 08/01/2018   CO2 28 08/01/2018    Lab Results  Component Value Date   ALT 15 08/01/2018   AST 18 08/01/2018   ALKPHOS 114 07/02/2018   BILITOT 0.4 08/01/2018    Lab Results  Component Value Date   CHOL 161 08/01/2018   HDL 29 (L) 08/01/2018   LDLCALC 109 (H) 08/01/2018   TRIG 120 08/01/2018   CHOLHDL 5.6 (H) 08/01/2018   Lab Results  Component Value Date   LABRPR Reactive (A) 07/01/2018   HIV 1 RNA Quant (copies/mL)  Date Value  08/01/2018 125 (H)  07/19/2018 350 (H)  07/11/2018 427 (H)   CD4 (/uL)  Date Value  08/01/2018 201 (L)   CD4 T Cell Abs (/uL)  Date Value  10/05/2017 260 (L)  06/27/2017 300 (L)  03/14/2017 128 (L)     Problem List Items Addressed This Visit      Medium   Polysubstance abuse (HCC)    He has not had any recent illicit drug use.      Human immunodeficiency virus (HIV) disease (HCC)    His adherence has been much better over the past year and his infection has come under better control.  He will continue his current regimen, get lab work today and follow-up in 3 months.      Relevant Orders   T-helper cell (CD4)- ([facility] clinic only)   HIV-1 RNA quant-no reflex-bld   Depression     His chronic depression and anxiety are much better coincident with improved adherence to his HIV medications and abstinence from illicit drugs.  I will arrange an E visit with our behavioral health counselor today.  [name], MD [facility] [phone] pager   [phone] cell 10/30/2018, 10:12 AM

## 2018-10-30 NOTE — Assessment & Plan Note (Signed)
His chronic depression and anxiety are much better coincident with improved adherence to his HIV medications and abstinence from illicit drugs.  I will arrange an E visit with our behavioral health counselor today.

## 2018-10-31 LAB — T-HELPER CELL (CD4) - (RCID CLINIC ONLY)
CD4 % Helper T Cell: 14 % — ABNORMAL LOW (ref 33–65)
CD4 T Cell Abs: 332 /uL — ABNORMAL LOW (ref 400–1790)

## 2018-11-06 ENCOUNTER — Ambulatory Visit: Payer: Medicare Other | Admitting: Licensed Clinical Social Worker

## 2018-11-06 ENCOUNTER — Other Ambulatory Visit: Payer: Self-pay

## 2018-11-06 LAB — HIV-1 RNA QUANT-NO REFLEX-BLD
HIV 1 RNA Quant: 72 copies/mL — ABNORMAL HIGH
HIV-1 RNA Quant, Log: 1.86 Log copies/mL — ABNORMAL HIGH

## 2018-11-07 ENCOUNTER — Encounter (INDEPENDENT_AMBULATORY_CARE_PROVIDER_SITE_OTHER): Payer: Self-pay | Admitting: *Deleted

## 2018-11-07 ENCOUNTER — Other Ambulatory Visit: Payer: Self-pay

## 2018-11-07 VITALS — BP 109/78 | HR 74 | Temp 98.0°F | Wt 178.0 lb

## 2018-11-07 DIAGNOSIS — Z006 Encounter for examination for normal comparison and control in clinical research program: Secondary | ICD-10-CM

## 2018-11-07 NOTE — Research (Signed)
Jordan Calderon is here for his month 64 Reprieve visit. Excellent adherence with his ARV's and study medication. No new complaints or mediations. Denied any muscle aches or weakness. His next study visit is scheduled for 03/05/19.

## 2019-01-16 ENCOUNTER — Other Ambulatory Visit: Payer: Medicare Other

## 2019-01-16 ENCOUNTER — Other Ambulatory Visit: Payer: Self-pay

## 2019-01-16 DIAGNOSIS — B20 Human immunodeficiency virus [HIV] disease: Secondary | ICD-10-CM

## 2019-01-17 LAB — T-HELPER CELL (CD4) - (RCID CLINIC ONLY)
CD4 % Helper T Cell: 16 % — ABNORMAL LOW (ref 33–65)
CD4 T Cell Abs: 395 /uL — ABNORMAL LOW (ref 400–1790)

## 2019-01-22 LAB — HIV-1 RNA QUANT-NO REFLEX-BLD
HIV 1 RNA Quant: 56 copies/mL — ABNORMAL HIGH
HIV-1 RNA Quant, Log: 1.75 Log copies/mL — ABNORMAL HIGH

## 2019-01-31 ENCOUNTER — Ambulatory Visit (INDEPENDENT_AMBULATORY_CARE_PROVIDER_SITE_OTHER): Payer: Medicare Other | Admitting: Internal Medicine

## 2019-01-31 ENCOUNTER — Other Ambulatory Visit: Payer: Self-pay

## 2019-01-31 DIAGNOSIS — B20 Human immunodeficiency virus [HIV] disease: Secondary | ICD-10-CM | POA: Diagnosis not present

## 2019-01-31 NOTE — Progress Notes (Signed)
Virtual Visit via Telephone Note  I connected with Jordan Calderon on 01/31/19 at  9:30 AM EDT by telephone and verified that I am speaking with the correct person using two identifiers.  Location: Patient: Home  Provider: [facility]   I discussed the limitations, risks, security and privacy concerns of performing an evaluation and management service by telephone and the availability of in person appointments. I also discussed with the patient that there may be a patient responsible charge related to this service. The patient expressed understanding and agreed to proceed.   History of Present Illness: I spoke to Jordan Calderon by phone today.  He has had no problems obtaining, taking or tolerating his Genvoya.  He continues to use his daily pill chart to keep himself organized.  He does not recall missing any doses.  He has been social distancing at home during the [place] pandemic.  He denies any recent illicit drug use.  Fortunately he has not noted any increase in his chronic anxiety and depression during the pandemic.  He says that he has getting along well with his sister, Jordan Calderon.  She is coming to visit today.   Observations/Objective: HIV 1 RNA Quant (copies/mL)  Date Value  01/16/2019 56 (H)  10/30/2018 72 (H)  08/01/2018 125 (H)   CD4 (/uL)  Date Value  08/01/2018 201 (L)   CD4 T Cell Abs (/uL)  Date Value  01/16/2019 395 (L)  10/30/2018 332 (L)  10/05/2017 260 (L)   Assessment and Plan: His HIV infection is coming back under control coincident with becoming sober again.  Follow Up Instructions: Continue Descovy and Prezcobix Follow-up here in 3 months   I discussed the assessment and treatment plan with the patient. The patient was provided an opportunity to ask questions and all were answered. The patient agreed with the plan and demonstrated an understanding of the instructions.   The patient was advised to call back or seek an in-person evaluation if the symptoms worsen or if the  condition fails to improve as anticipated.  I provided 15 minutes of non-face-to-face time during this encounter.   Michel Bickers, MD

## 2019-03-05 ENCOUNTER — Other Ambulatory Visit: Payer: Self-pay

## 2019-03-05 ENCOUNTER — Encounter (INDEPENDENT_AMBULATORY_CARE_PROVIDER_SITE_OTHER): Payer: Self-pay | Admitting: *Deleted

## 2019-03-05 VITALS — BP 133/89 | HR 73 | Temp 97.9°F | Wt 170.0 lb

## 2019-03-05 DIAGNOSIS — Z006 Encounter for examination for normal comparison and control in clinical research program: Secondary | ICD-10-CM

## 2019-03-05 NOTE — Research (Signed)
Jordan Calderon is here for his month 40 Reprieve visit. No new complaints or medications. Verbalized excellent adherence with his ARV's and study medication. Denied any muscle aches or weakness. He will return 07/03/19 for his next study visit.

## 2019-03-12 ENCOUNTER — Other Ambulatory Visit: Payer: Self-pay | Admitting: Internal Medicine

## 2019-03-12 DIAGNOSIS — F4310 Post-traumatic stress disorder, unspecified: Secondary | ICD-10-CM

## 2019-04-15 ENCOUNTER — Other Ambulatory Visit: Payer: Self-pay

## 2019-04-15 ENCOUNTER — Other Ambulatory Visit: Payer: Self-pay | Admitting: Internal Medicine

## 2019-04-15 DIAGNOSIS — F411 Generalized anxiety disorder: Secondary | ICD-10-CM

## 2019-04-15 MED ORDER — LORAZEPAM 2 MG PO TABS: 2.0000 mg | ORAL_TABLET | Freq: Two times a day (BID) | ORAL | 5 refills | Status: DC | PRN

## 2019-04-16 ENCOUNTER — Other Ambulatory Visit: Payer: Self-pay

## 2019-04-16 DIAGNOSIS — F411 Generalized anxiety disorder: Secondary | ICD-10-CM

## 2019-05-06 ENCOUNTER — Ambulatory Visit: Payer: Medicare Other | Admitting: Internal Medicine

## 2019-05-20 ENCOUNTER — Other Ambulatory Visit: Payer: Self-pay

## 2019-05-20 ENCOUNTER — Ambulatory Visit (INDEPENDENT_AMBULATORY_CARE_PROVIDER_SITE_OTHER): Payer: Medicare Other | Admitting: Internal Medicine

## 2019-05-20 DIAGNOSIS — B20 Human immunodeficiency virus [HIV] disease: Secondary | ICD-10-CM

## 2019-05-20 DIAGNOSIS — F330 Major depressive disorder, recurrent, mild: Secondary | ICD-10-CM | POA: Diagnosis not present

## 2019-05-20 DIAGNOSIS — F411 Generalized anxiety disorder: Secondary | ICD-10-CM | POA: Diagnosis not present

## 2019-05-20 NOTE — Progress Notes (Signed)
Patient Active Problem List   Diagnosis Date Noted  . Former cigarette smoker 07/29/2008    Priority: Medium  . Human immunodeficiency virus (HIV) disease (HCC) 04/06/2006    Priority: Medium  . Anxiety state 04/06/2006    Priority: Medium  . Polysubstance abuse (HCC) 04/06/2006    Priority: Medium  . Depression 04/06/2006    Priority: Medium  . Hepatitis C 04/06/2006    Priority: Low  . 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 Normocytic anemia 07/01/2018  . DENTAL CARIES 04/06/2006  . ANAL FISSURE 04/06/2006    Patient's Medications  New Prescriptions   No medications on file  Previous Medications   DARUNAVIR-COBICISTAT (PREZCOBIX) 800-150 MG TABLET    Take 1 tablet by mouth daily. Swallow whole. Do NOT crush, break or chew tablets. Take with food.   EMTRICITABINE-TENOFOVIR AF (DESCOVY) 200-25 MG TABLET    Take 1 tablet by mouth daily.   IBUPROFEN (ADVIL,MOTRIN) 200 MG TABLET    Take 800 mg by mouth every 6 (six) hours as needed (pain).   LORAZEPAM (ATIVAN) 2 MG TABLET    Take 1 tablet (2 mg total) by mouth 2 (two) times daily as needed. for anxiety   PAROXETINE (PAXIL) 40 MG TABLET    TAKE 1 TABLET BY MOUTH EVERY DAY   STUDY - REPRIEVE A5332 - PITAVASTATIN 4 MG OR PLACEBO TABLET    Take 1 tablet (4 mg total) by mouth daily.  Modified Medications   No medications on file  Discontinued Medications   No medications on file    Subjective: Tamel is in for his routine HIV follow-up visit.  He has not had any problems obtaining, taking or tolerating his Descovy or Prezcobix.  He thinks he may have missed 2 days in the past 6 months when he forgot.  He normally takes his medications at 11 AM when he feeds his dogs.  His chronic anxiety and depression are mild and stable.  He has mostly been staying at home alone during the Covid pandemic.  He has not gone back to smoking cigarettes or using drugs.  His relationship with his sister, [name], is going well.  She comes once a month  to take him to the grocery store.  Review of Systems: Review of Systems  Constitutional: Negative for chills, diaphoresis, fever, malaise/fatigue and weight loss.  HENT: Negative for sore throat.   Respiratory: Negative for cough, sputum production and shortness of breath.   Cardiovascular: Negative for chest pain.  Gastrointestinal: Negative for abdominal pain, diarrhea, heartburn, nausea and vomiting.  Genitourinary: Negative for dysuria and frequency.  Musculoskeletal: Negative for joint pain and myalgias.  Skin: Negative for rash.  Neurological: Negative for dizziness and headaches.  Psychiatric/Behavioral: Positive for depression. Negative for substance abuse. The patient is nervous/anxious.     Past Medical History:  Diagnosis Date  . Anxiety   . Cellulitis 02/11/2014   LEFT ARM  . Depression   . Headache(784.0)   . HIV (human immunodeficiency virus infection) (HCC)     Social History   Tobacco Use  . Smoking status: Former Smoker    Packs/day: 0.10    Years: 30.00    Pack years: 3.00    Types: Cigarettes    Quit date: 11/19/2014    Years since quitting: 4.5  . Smokeless tobacco: Never Used  Substance Use Topics  . Alcohol use: No    Alcohol/week: 0.0 standard drinks  . Drug use: No  Family History  Adopted: Yes    Allergies  Allergen Reactions  . Sulfonamide Derivatives Itching    Health Maintenance  Topic Date Due  . TETANUS/TDAP  07/14/1978  . COLONOSCOPY  07/14/2009  . INFLUENZA VACCINE  01/19/2019  . Hepatitis C Screening  Completed  . HIV Screening  Completed    Objective:  Vitals:   05/20/19 1542  BP: 140/84  Pulse: (!) 106  Temp: 98.4 F (36.9 C)  TempSrc: Oral  Weight: 167 lb (75.8 kg)   Body mass index is 24.66 kg/m.  Physical Exam HENT:     Mouth/Throat:     Pharynx: No oropharyngeal exudate.  Eyes:     Conjunctiva/sclera: Conjunctivae normal.  Cardiovascular:     Rate and Rhythm: Normal rate and regular rhythm.      Heart sounds: No murmur.  Pulmonary:     Effort: Pulmonary effort is normal.     Breath sounds: Normal breath sounds.  Abdominal:     Palpations: Abdomen is soft. There is no mass.     Tenderness: There is no abdominal tenderness.  Musculoskeletal: Normal range of motion.  Skin:    Findings: No rash.  Neurological:     General: No focal deficit present.     Mental Status: He is alert and oriented to person, place, and time.  Psychiatric:        Mood and Affect: Mood normal.     Lab Results Lab Results  Component Value Date   WBC 6.7 07/11/2018   HGB 12.7 (L) 07/11/2018   HCT 39 08/01/2018   MCV 84.1 07/11/2018   PLT 307 07/11/2018    Lab Results  Component Value Date   CREATININE 1.17 08/01/2018   BUN 20 08/01/2018   NA 140 08/01/2018   K 4.3 08/01/2018   CL 106 08/01/2018   CO2 28 08/01/2018    Lab Results  Component Value Date   ALT 15 08/01/2018   AST 18 08/01/2018   ALKPHOS 114 07/02/2018   BILITOT 0.4 08/01/2018    Lab Results  Component Value Date   CHOL 161 08/01/2018   HDL 29 (L) 08/01/2018   LDLCALC 109 (H) 08/01/2018   TRIG 120 08/01/2018   CHOLHDL 5.6 (H) 08/01/2018   Lab Results  Component Value Date   LABRPR Reactive (A) 07/01/2018   HIV 1 RNA Quant (copies/mL)  Date Value  01/16/2019 56 (H)  10/30/2018 72 (H)  08/01/2018 125 (H)   CD4 (/uL)  Date Value  08/01/2018 201 (L)   CD4 T Cell Abs (/uL)  Date Value  01/16/2019 395 (L)  10/30/2018 332 (L)  10/05/2017 260 (L)     Problem List Items Addressed This Visit      Medium   Human immunodeficiency virus (HIV) disease ([place])    He has good, long-term control of his infection.  His adherence remains good.  I did give him a pillbox and asked him to try using it again.  He has already received his influenza vaccine.  He will get repeat blood work today and follow-up in 6 months.      Relevant Orders   T-helper cell (CD4)- ([facility] clinic only)   VL   Depression    His chronic  depression is mild and stable.      Anxiety state    His chronic anxiety is mild and stable and, somewhat surprisingly not worsened during the Covid pandemic.           Jenny Reichmann  Orvan Falconerampbell, MD [facility] [phone] pager   [phone] cell 05/20/2019, 4:05 PM

## 2019-05-20 NOTE — Assessment & Plan Note (Signed)
He has good, long-term control of his infection.  His adherence remains good.  I did give him a pillbox and asked him to try using it again.  He has already received his influenza vaccine.  He will get repeat blood work today and follow-up in 6 months.

## 2019-05-20 NOTE — Assessment & Plan Note (Signed)
His chronic anxiety is mild and stable and, somewhat surprisingly not worsened during the Covid pandemic.

## 2019-05-20 NOTE — Assessment & Plan Note (Signed)
His chronic depression is mild and stable. 

## 2019-05-21 LAB — T-HELPER CELL (CD4) - (RCID CLINIC ONLY)
CD4 % Helper T Cell: 16 % — ABNORMAL LOW (ref 33–65)
CD4 T Cell Abs: 337 /uL — ABNORMAL LOW (ref 400–1790)

## 2019-05-28 LAB — HIV-1 RNA QUANT-NO REFLEX-BLD
HIV 1 RNA Quant: 385 copies/mL — ABNORMAL HIGH
HIV-1 RNA Quant, Log: 2.59 Log copies/mL — ABNORMAL HIGH

## 2019-06-17 ENCOUNTER — Telehealth: Payer: Self-pay

## 2019-06-17 MED ORDER — AMOXICILLIN-POT CLAVULANATE 875-125 MG PO TABS: 1.0000 | ORAL_TABLET | Freq: Two times a day (BID) | ORAL | 0 refills | Status: DC

## 2019-06-17 NOTE — Telephone Encounter (Signed)
Thank you :)

## 2019-06-17 NOTE — Telephone Encounter (Signed)
Patient called requesting antibiotic for tooth/mouth that has been ongoing since 12/25. Patient states he will also call [place] team to get scheduled within the next two weeks if possible. Patient states he has transportation issues. Routing to provider to make aware. Eugenia Mcalpine

## 2019-06-17 NOTE — Telephone Encounter (Signed)
Patient called and made aware of RX sent to pharmacy. Patient will call [facility] to follow up with Dental Clinic. Jordan Calderon

## 2019-06-17 NOTE — Telephone Encounter (Signed)
Augmentin sent to [facility] on [address].

## 2019-06-17 NOTE — Addendum Note (Signed)
Addended by: Mauricio Po D on: 06/17/2019 03:27 PM   Modules accepted: Orders

## 2019-07-03 ENCOUNTER — Encounter: Payer: Medicare Other | Admitting: *Deleted

## 2019-07-10 ENCOUNTER — Other Ambulatory Visit: Payer: Self-pay

## 2019-07-10 ENCOUNTER — Encounter (INDEPENDENT_AMBULATORY_CARE_PROVIDER_SITE_OTHER): Payer: Self-pay | Admitting: *Deleted

## 2019-07-10 VITALS — BP 128/87 | HR 88 | Temp 97.6°F | Wt 156.0 lb

## 2019-07-10 DIAGNOSIS — Z006 Encounter for examination for normal comparison and control in clinical research program: Secondary | ICD-10-CM

## 2019-07-10 NOTE — Research (Signed)
Jordan Calderon is here for his month 64 Reprieve visit. He appeared anxious and nervous at today's visit.  States that his sister came last week and that it was a difficult visit. He says that she's not happy with his most recent lab results and was concerned about his weight. He is down about 10 lbs since his last clinic visit in November. He states that he has been eating but may miss meals if he is busy doing things around the house. He says that it's also "hard" to cook for just one person. We discussed eating small frequent meals and adding carnation instant breakfast as a supplement. He said that he would give it a try. He states that he has not been consist with taking his ARVs. He stated that when he sees a "good" lab result that he feels he can miss or stop taking his medication. We again discussed the importance of taking it everyday. He agreed to get back to taking it daily when he feeds the dogs at 11am. He says that he has been consistent in taking his study medication. Denied any muscle aches or weakness. He agreed that he needs to get back to counseling. He is scheduled to meet with Marylu Lund next week. He will return in May for his next study visit.

## 2019-07-18 ENCOUNTER — Ambulatory Visit: Payer: Medicare Other

## 2019-08-21 ENCOUNTER — Other Ambulatory Visit: Payer: Self-pay | Admitting: Internal Medicine

## 2019-08-21 DIAGNOSIS — B20 Human immunodeficiency virus [HIV] disease: Secondary | ICD-10-CM

## 2019-09-23 ENCOUNTER — Telehealth: Payer: Self-pay

## 2019-09-23 NOTE — Telephone Encounter (Signed)
Patient is calling to request an antibiotic piror to his dental appointment on Marc 13, 2021.  He states this routine for him.  I did not see any documentation of antibiotics prior to dental appointment or a medical need that would require prophylactic treatment.   Please advise  Jordan Sellin Gorden Harms RN

## 2019-09-23 NOTE — Telephone Encounter (Signed)
Please tell Jordan Calderon that he does not need antibiotics prior to seeing the dentist unless he has a dental infection and that can be evaluated at the time of his dental appointment.

## 2019-09-24 NOTE — Telephone Encounter (Signed)
Return call, patient's voicemail is full.   Laurell Josephs, RN

## 2019-10-10 ENCOUNTER — Other Ambulatory Visit: Payer: Self-pay | Admitting: Internal Medicine

## 2019-10-10 ENCOUNTER — Telehealth: Payer: Self-pay

## 2019-10-10 ENCOUNTER — Other Ambulatory Visit: Payer: Self-pay | Admitting: *Deleted

## 2019-10-10 ENCOUNTER — Other Ambulatory Visit: Payer: Self-pay

## 2019-10-10 DIAGNOSIS — F411 Generalized anxiety disorder: Secondary | ICD-10-CM

## 2019-10-10 MED ORDER — LORAZEPAM 2 MG PO TABS: 2.0000 mg | ORAL_TABLET | Freq: Two times a day (BID) | ORAL | 5 refills | Status: DC | PRN

## 2019-10-10 NOTE — Telephone Encounter (Signed)
Called- Rx Lorazepam 2mg  #60, R-5--[facility]) [phone]

## 2019-10-10 NOTE — Telephone Encounter (Signed)
I ordered refills.

## 2019-10-10 NOTE — Telephone Encounter (Signed)
Received refill request + patient phone call requesting refill on his lorazepam. Forwarding to provider for approval.   Rosanna Randy, RN

## 2019-10-25 ENCOUNTER — Other Ambulatory Visit: Payer: Self-pay | Admitting: *Deleted

## 2019-10-25 DIAGNOSIS — B20 Human immunodeficiency virus [HIV] disease: Secondary | ICD-10-CM

## 2019-10-25 DIAGNOSIS — Z113 Encounter for screening for infections with a predominantly sexual mode of transmission: Secondary | ICD-10-CM

## 2019-10-25 DIAGNOSIS — Z79899 Other long term (current) drug therapy: Secondary | ICD-10-CM

## 2019-10-28 ENCOUNTER — Other Ambulatory Visit: Payer: Medicare Other

## 2019-10-28 ENCOUNTER — Other Ambulatory Visit (HOSPITAL_COMMUNITY)
Admission: RE | Admit: 2019-10-28 | Discharge: 2019-10-28 | Disposition: A | Discharge status: Home / Self Care | Payer: Medicare Other | Source: Ambulatory Visit | Attending: Internal Medicine | Admitting: Internal Medicine

## 2019-10-28 ENCOUNTER — Encounter (INDEPENDENT_AMBULATORY_CARE_PROVIDER_SITE_OTHER): Payer: Self-pay | Admitting: *Deleted

## 2019-10-28 ENCOUNTER — Other Ambulatory Visit: Payer: Self-pay

## 2019-10-28 VITALS — BP 137/95 | HR 92 | Temp 97.9°F | Wt 170.0 lb

## 2019-10-28 DIAGNOSIS — B20 Human immunodeficiency virus [HIV] disease: Secondary | ICD-10-CM | POA: Insufficient documentation

## 2019-10-28 DIAGNOSIS — Z006 Encounter for examination for normal comparison and control in clinical research program: Secondary | ICD-10-CM

## 2019-10-28 DIAGNOSIS — Z79899 Other long term (current) drug therapy: Secondary | ICD-10-CM

## 2019-10-28 DIAGNOSIS — Z113 Encounter for screening for infections with a predominantly sexual mode of transmission: Secondary | ICD-10-CM | POA: Diagnosis present

## 2019-10-28 NOTE — Research (Signed)
Jordan Calderon is here for his month 78 Reprieve visit. States that he was seen recently at the dental clinic for a tooth extraction. He says that he now has another tooth that has cracked and will need treatment. No other complaints or concerns verbalized. States that he has been adherent with his ARV and study mediations. Denied any muscle aches or weakness. He has received both doses of his Covid vaccine. He will return in a couple of weeks to see Dr. Orvan Falconer. His next study visit is scheduled for 03/09/20.

## 2019-10-29 LAB — URINE CYTOLOGY ANCILLARY ONLY
Chlamydia: NEGATIVE
Comment: NEGATIVE
Comment: NORMAL
Neisseria Gonorrhea: NEGATIVE

## 2019-10-29 LAB — T-HELPER CELL (CD4) - (RCID CLINIC ONLY)
CD4 % Helper T Cell: 11 % — ABNORMAL LOW (ref 33–65)
CD4 T Cell Abs: 209 /uL — ABNORMAL LOW (ref 400–1790)

## 2019-10-30 LAB — RPR TITER: RPR Titer: 1:4 {titer} — ABNORMAL HIGH

## 2019-10-30 LAB — COMPLETE METABOLIC PANEL WITH GFR
AG Ratio: 1 (calc) (ref 1.0–2.5)
ALT: 15 U/L (ref 9–46)
AST: 16 U/L (ref 10–35)
Albumin: 4.1 g/dL (ref 3.6–5.1)
Alkaline phosphatase (APISO): 56 U/L (ref 35–144)
BUN: 20 mg/dL (ref 7–25)
CO2: 27 mmol/L (ref 20–32)
Calcium: 9.5 mg/dL (ref 8.6–10.3)
Chloride: 101 mmol/L (ref 98–110)
Creat: 1.06 mg/dL (ref 0.70–1.25)
GFR, Est African American: 88 mL/min/{1.73_m2} (ref >=60)
GFR, Est Non African American: 76 mL/min/{1.73_m2} (ref >=60)
Globulin: 4.1 g/dL (calc) — ABNORMAL HIGH (ref 1.9–3.7)
Glucose, Bld: 99 mg/dL (ref 65–99)
Potassium: 4.5 mmol/L (ref 3.5–5.3)
Sodium: 136 mmol/L (ref 135–146)
Total Bilirubin: 0.4 mg/dL (ref 0.2–1.2)
Total Protein: 8.2 g/dL — ABNORMAL HIGH (ref 6.1–8.1)

## 2019-10-30 LAB — LIPID PANEL
Cholesterol: 167 mg/dL (ref <200)
HDL: 33 mg/dL — ABNORMAL LOW (ref >=40)
LDL Cholesterol (Calc): 108 mg/dL (calc) — ABNORMAL HIGH
Non-HDL Cholesterol (Calc): 134 mg/dL (calc) — ABNORMAL HIGH (ref <130)
Total CHOL/HDL Ratio: 5.1 (calc) — ABNORMAL HIGH (ref <5.0)
Triglycerides: 146 mg/dL (ref <150)

## 2019-10-30 LAB — CBC WITH DIFFERENTIAL/PLATELET
Absolute Monocytes: 634 cells/uL (ref 200–950)
Basophils Absolute: 72 cells/uL (ref 0–200)
Basophils Relative: 1 %
Eosinophils Absolute: 338 cells/uL (ref 15–500)
Eosinophils Relative: 4.7 %
HCT: 38.3 % — ABNORMAL LOW (ref 38.5–50.0)
Hemoglobin: 13.1 g/dL — ABNORMAL LOW (ref 13.2–17.1)
Lymphs Abs: 1958 cells/uL (ref 850–3900)
MCH: 30.2 pg (ref 27.0–33.0)
MCHC: 34.2 g/dL (ref 32.0–36.0)
MCV: 88.2 fL (ref 80.0–100.0)
MPV: 9 fL (ref 7.5–12.5)
Monocytes Relative: 8.8 %
Neutro Abs: 4198 cells/uL (ref 1500–7800)
Neutrophils Relative %: 58.3 %
Platelets: 236 10*3/uL (ref 140–400)
RBC: 4.34 10*6/uL (ref 4.20–5.80)
RDW: 14.1 % (ref 11.0–15.0)
Total Lymphocyte: 27.2 %
WBC: 7.2 10*3/uL (ref 3.8–10.8)

## 2019-10-30 LAB — HIV-1 RNA QUANT-NO REFLEX-BLD
HIV 1 RNA Quant: 1930 copies/mL — ABNORMAL HIGH
HIV-1 RNA Quant, Log: 3.29 Log copies/mL — ABNORMAL HIGH

## 2019-10-30 LAB — RPR: RPR Ser Ql: REACTIVE — AB

## 2019-10-30 LAB — FLUORESCENT TREPONEMAL AB(FTA)-IGG-BLD: Fluorescent Treponemal ABS: REACTIVE — AB

## 2019-11-11 ENCOUNTER — Telehealth: Payer: Self-pay

## 2019-11-11 NOTE — Telephone Encounter (Signed)
COVID-19 Pre-Screening Questions:11/11/19  Do you currently have a fever (>100 F), chills or unexplained body aches?NO  Are you currently experiencing new cough, shortness of breath, sore throat, runny nose? NO .  Have you recently travelled outside the [place]  in the last 14 days? NO .  Have you been in contact with someone that is currently pending confirmation of Covid19 testing or has been confirmed to have the Covid19 virus? NO  **If the patient answers NO to ALL questions -  advise the patient to please call the clinic before coming to the office should any symptoms develop.     

## 2019-11-12 ENCOUNTER — Encounter: Payer: Medicare Other | Admitting: Internal Medicine

## 2019-11-19 ENCOUNTER — Ambulatory Visit (INDEPENDENT_AMBULATORY_CARE_PROVIDER_SITE_OTHER): Payer: Medicare Other | Admitting: Internal Medicine

## 2019-11-19 ENCOUNTER — Encounter: Payer: Self-pay | Admitting: Internal Medicine

## 2019-11-19 ENCOUNTER — Other Ambulatory Visit: Payer: Self-pay

## 2019-11-19 DIAGNOSIS — B20 Human immunodeficiency virus [HIV] disease: Secondary | ICD-10-CM

## 2019-11-19 DIAGNOSIS — F1910 Other psychoactive substance abuse, uncomplicated: Secondary | ICD-10-CM

## 2019-11-19 DIAGNOSIS — F3340 Major depressive disorder, recurrent, in remission, unspecified: Secondary | ICD-10-CM

## 2019-11-19 NOTE — Assessment & Plan Note (Signed)
I hope I can believe him when he says he was not back using street drugs.

## 2019-11-19 NOTE — Assessment & Plan Note (Signed)
Unfortunately this is a long-term pattern for [employer].  His adherence will be good for several more years then he will suddenly quit taking his medication again.  I asked him to think about whether it made sense to stop taking his HIV meds, something that could cause him significant harm when he is mad at his sister.  I also asked him to think about why he did not inform me of what was happening.  He will get repeat lab work today and follow-up in 6 weeks.

## 2019-11-19 NOTE — Assessment & Plan Note (Signed)
His depression worsened while he was not taking his HIV medications but is now back in remission.

## 2019-11-19 NOTE — Progress Notes (Signed)
Patient Active Problem List   Diagnosis Date Noted  . Former cigarette smoker 07/29/2008    Priority: Medium  . Human immunodeficiency virus (HIV) disease (HCC) 04/06/2006    Priority: Medium  . Anxiety state 04/06/2006    Priority: Medium  . Polysubstance abuse (HCC) 04/06/2006    Priority: Medium  . Depression 04/06/2006    Priority: Medium  . Hepatitis C 04/06/2006    Priority: Low  . 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 Normocytic anemia 07/01/2018  . DENTAL CARIES 04/06/2006  . ANAL FISSURE 04/06/2006    Patient's Medications  New Prescriptions   No medications on file  Previous Medications   AMOXICILLIN-CLAVULANATE (AUGMENTIN) 875-125 MG TABLET    Take 1 tablet by mouth 2 (two) times daily.   DESCOVY 200-25 MG TABLET    TAKE 1 TABLET BY MOUTH DAILY   IBUPROFEN (ADVIL,MOTRIN) 200 MG TABLET    Take 800 mg by mouth every 6 (six) hours as needed (pain).   LORAZEPAM (ATIVAN) 2 MG TABLET    Take 1 tablet (2 mg total) by mouth 2 (two) times daily as needed. for anxiety   PAROXETINE (PAXIL) 40 MG TABLET    TAKE 1 TABLET BY MOUTH EVERY DAY   PREZCOBIX 800-150 MG TABLET    TAKE 1 TABLET BY MOUTH DAILY. SWALLOW WHOLE. DO NOT CRUSH, BREAK OR CHEW TABLETS.TK WITH FOOD   STUDY - REPRIEVE [phone] - PITAVASTATIN 4 MG OR PLACEBO TABLET    Take 1 tablet (4 mg total) by mouth daily.  Modified Medications   No medications on file  Discontinued Medications   No medications on file    Subjective: Jordan Calderon is in for his routine HIV follow-up visit.  He readily admits that he had not been taking his Triumeq or Prezcobix when he was here for lab work on 10/28/2019.  He thinks he was probably off for several months.  He continues to take his statin study medication, lorazepam and paroxetine.  He says that he stopped taking his medication because he was mad at his sister Jordan Calderon.  He was feeling more depressed when he was off his medication but is feeling better since he restarted 3 weeks ago.  He  denies any recent drug use.  He says that his mood now is "really good".  Review of Systems: Review of Systems  Constitutional: Negative for chills, diaphoresis, fever, malaise/fatigue and weight loss.  HENT: Negative for sore throat.   Respiratory: Negative for cough, sputum production and shortness of breath.   Cardiovascular: Negative for chest pain.  Gastrointestinal: Negative for abdominal pain, diarrhea, heartburn, nausea and vomiting.  Genitourinary: Negative for dysuria and frequency.  Musculoskeletal: Negative for joint pain and myalgias.  Skin: Negative for rash.  Neurological: Negative for dizziness and headaches.  Psychiatric/Behavioral: Negative for depression and substance abuse. The patient is nervous/anxious.     Past Medical History:  Diagnosis Date  . Anxiety   . Cellulitis 02/11/2014   LEFT ARM  . Depression   . Headache(784.0)   . HIV (human immunodeficiency virus infection) (HCC)     Social History   Tobacco Use  . Smoking status: Former Smoker    Packs/day: 0.10    Years: 30.00    Pack years: 3.00    Types: Cigarettes    Quit date: 11/19/2014    Years since quitting: 5.0  . Smokeless tobacco: Never Used  Substance Use Topics  . Alcohol use: No  Alcohol/week: 0.0 standard drinks  . Drug use: No    Family History  Adopted: Yes    Allergies  Allergen Reactions  . Sulfonamide Derivatives Itching    Health Maintenance  Topic Date Due  . TETANUS/TDAP  Never done  . COLONOSCOPY  Never done  . INFLUENZA VACCINE  01/19/2020  . COVID-19 Vaccine  Completed  . Hepatitis C Screening  Completed  . HIV Screening  Completed    Objective:  Vitals:   11/19/19 1509  BP: (!) 145/90  Pulse: 74  SpO2: 98%  Weight: 171 lb (77.6 kg)  Height: 5\' 9"  (1.753 m)   Body mass index is 25.25 kg/m.  Physical Exam Constitutional:      General: He is not in acute distress.    Comments: He has all of his medications with him.  Cardiovascular:     Rate  and Rhythm: Normal rate and regular rhythm.     Heart sounds: No murmur.  Pulmonary:     Effort: Pulmonary effort is normal.     Breath sounds: Normal breath sounds.  Abdominal:     Palpations: Abdomen is soft.     Tenderness: There is no abdominal tenderness.  Skin:    Findings: No rash.  Neurological:     General: No focal deficit present.  Psychiatric:        Mood and Affect: Mood normal.     Lab Results Lab Results  Component Value Date   WBC 7.2 10/28/2019   HGB 13.1 (L) 10/28/2019   HCT 38.3 (L) 10/28/2019   MCV 88.2 10/28/2019   PLT 236 10/28/2019    Lab Results  Component Value Date   CREATININE 1.06 10/28/2019   BUN 20 10/28/2019   NA 136 10/28/2019   K 4.5 10/28/2019   CL 101 10/28/2019   CO2 27 10/28/2019    Lab Results  Component Value Date   ALT 15 10/28/2019   AST 16 10/28/2019   ALKPHOS 114 07/02/2018   BILITOT 0.4 10/28/2019    Lab Results  Component Value Date   CHOL 167 10/28/2019   HDL 33 (L) 10/28/2019   LDLCALC 108 (H) 10/28/2019   TRIG 146 10/28/2019   CHOLHDL 5.1 (H) 10/28/2019   Lab Results  Component Value Date   LABRPR REACTIVE (A) 10/28/2019   RPRTITER 1:4 (H) 10/28/2019   HIV 1 RNA Quant (copies/mL)  Date Value  10/28/2019 1,930 (H)  05/20/2019 385 (H)  01/16/2019 56 (H)   CD4 (/uL)  Date Value  08/01/2018 201 (L)   CD4 T Cell Abs (/uL)  Date Value  10/28/2019 209 (L)  05/20/2019 337 (L)  01/16/2019 395 (L)     Problem List Items Addressed This Visit      Medium   Polysubstance abuse (HCC)    I hope I can believe him when he says he was not back using street drugs.      Human immunodeficiency virus (HIV) disease (HCC)    Unfortunately this is a long-term pattern for 01/18/2019.  His adherence will be good for several more years then he will suddenly quit taking his medication again.  I asked him to think about whether it made sense to stop taking his HIV meds, something that could cause him significant harm when he  is mad at his sister.  I also asked him to think about why he did not inform me of what was happening.  He will get repeat lab work today and follow-up in  6 weeks.      Relevant Orders   T-helper cell (CD4)- ([facility] clinic only)   HIV-1 RNA quant-no reflex-bld   Depression    His depression worsened while he was not taking his HIV medications but is now back in remission.           Michel Bickers, MD [facility] [place] [facility] [phone] pager   [phone] cell 11/19/2019, 3:24 PM

## 2019-11-20 LAB — T-HELPER CELL (CD4) - (RCID CLINIC ONLY)
CD4 % Helper T Cell: 10 % — ABNORMAL LOW (ref 33–65)
CD4 T Cell Abs: 198 /uL — ABNORMAL LOW (ref 400–1790)

## 2019-11-21 LAB — HIV-1 RNA QUANT-NO REFLEX-BLD
HIV 1 RNA Quant: 909 copies/mL — ABNORMAL HIGH
HIV-1 RNA Quant, Log: 2.96 Log copies/mL — ABNORMAL HIGH

## 2019-12-11 ENCOUNTER — Other Ambulatory Visit: Payer: Self-pay | Admitting: Internal Medicine

## 2019-12-11 DIAGNOSIS — F4310 Post-traumatic stress disorder, unspecified: Secondary | ICD-10-CM

## 2020-01-02 ENCOUNTER — Other Ambulatory Visit: Payer: Self-pay

## 2020-01-02 ENCOUNTER — Ambulatory Visit (INDEPENDENT_AMBULATORY_CARE_PROVIDER_SITE_OTHER): Payer: Medicare Other | Admitting: Internal Medicine

## 2020-01-02 DIAGNOSIS — B20 Human immunodeficiency virus [HIV] disease: Secondary | ICD-10-CM | POA: Diagnosis present

## 2020-01-02 DIAGNOSIS — F411 Generalized anxiety disorder: Secondary | ICD-10-CM

## 2020-01-02 DIAGNOSIS — F3340 Major depressive disorder, recurrent, in remission, unspecified: Secondary | ICD-10-CM

## 2020-01-02 NOTE — Assessment & Plan Note (Signed)
His infection was starting to come back under better control after restarting his medication about 3 weeks before his last visit.  I encouraged him to not miss any medications going forward.  I will check lab work today and see him back in 6 months.

## 2020-01-02 NOTE — Assessment & Plan Note (Signed)
His chronic anxiety is a little worse recently due to conflict with his sister.  Urged him to meet with our behavioral health counselor but he declines at this time.  I encouraged him to get regular, vigorous exercise.

## 2020-01-02 NOTE — Assessment & Plan Note (Signed)
His chronic recurrent depression is in remission.

## 2020-01-02 NOTE — Progress Notes (Signed)
Patient Active Problem List   Diagnosis Date Noted  . Former cigarette smoker 07/29/2008    Priority: Medium  . Human immunodeficiency virus (HIV) disease (HCC) 04/06/2006    Priority: Medium  . Anxiety state 04/06/2006    Priority: Medium  . Polysubstance abuse (HCC) 04/06/2006    Priority: Medium  . Depression 04/06/2006    Priority: Medium  . Hepatitis C 04/06/2006    Priority: Low  . 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 Normocytic anemia 07/01/2018  . DENTAL CARIES 04/06/2006  . ANAL FISSURE 04/06/2006    Patient's Medications  New Prescriptions   No medications on file  Previous Medications   AMOXICILLIN-CLAVULANATE (AUGMENTIN) 875-125 MG TABLET    Take 1 tablet by mouth 2 (two) times daily.   DESCOVY 200-25 MG TABLET    TAKE 1 TABLET BY MOUTH DAILY   IBUPROFEN (ADVIL,MOTRIN) 200 MG TABLET    Take 800 mg by mouth every 6 (six) hours as needed (pain).   LORAZEPAM (ATIVAN) 2 MG TABLET    Take 1 tablet (2 mg total) by mouth 2 (two) times daily as needed. for anxiety   PAROXETINE (PAXIL) 40 MG TABLET    TAKE 1 TABLET BY MOUTH EVERY DAY   PREZCOBIX 800-150 MG TABLET    TAKE 1 TABLET BY MOUTH DAILY. SWALLOW WHOLE. DO NOT CRUSH, BREAK OR CHEW TABLETS.TK WITH FOOD   STUDY - REPRIEVE [phone] - PITAVASTATIN 4 MG OR PLACEBO TABLET    Take 1 tablet (4 mg total) by mouth daily.  Modified Medications   No medications on file  Discontinued Medications   No medications on file    Subjective: Victory is in for his routine HIV follow-up visit.  He denies missing any doses of his Descovy or Prezcobix since his visit 6 weeks ago.  He states that he and his Sister [name] have decided not to talk to each other to help manage their conflict.  I do exchanged text messages as needed.  He says that he still struggles with his chronic anxiety but has not been feeling depressed.  He denies any drug use recently.  He has already received both of his Covid vaccines without difficulty.  Review of  Systems: Review of Systems  Constitutional: Negative for chills, diaphoresis, fever, malaise/fatigue and weight loss.  HENT: Negative for sore throat.   Respiratory: Negative for cough, sputum production and shortness of breath.   Cardiovascular: Negative for chest pain.  Gastrointestinal: Negative for abdominal pain, diarrhea, heartburn, nausea and vomiting.  Genitourinary: Negative for dysuria and frequency.  Musculoskeletal: Negative for joint pain and myalgias.  Skin: Negative for rash.  Neurological: Negative for dizziness and headaches.  Psychiatric/Behavioral: Negative for depression and substance abuse. The patient is nervous/anxious.     Past Medical History:  Diagnosis Date  . Anxiety   . Cellulitis 02/11/2014   LEFT ARM  . Depression   . Headache(784.0)   . HIV (human immunodeficiency virus infection) (HCC)     Social History   Tobacco Use  . Smoking status: Former Smoker    Packs/day: 0.10    Years: 30.00    Pack years: 3.00    Types: Cigarettes    Quit date: 11/19/2014    Years since quitting: 5.1  . Smokeless tobacco: Never Used  Vaping Use  . Vaping Use: Never used  Substance Use Topics  . Alcohol use: No    Alcohol/week: 0.0 standard drinks  . Drug use:  No    Family History  Adopted: Yes    Allergies  Allergen Reactions  . Sulfonamide Derivatives Itching    Health Maintenance  Topic Date Due  . TETANUS/TDAP  Never done  . COLONOSCOPY  Never done  . INFLUENZA VACCINE  01/19/2020  . COVID-19 Vaccine  Completed  . Hepatitis C Screening  Completed  . HIV Screening  Completed    Objective:  Vitals:   01/02/20 0919  BP: 110/79  Pulse: 83  SpO2: 98%  Weight: 160 lb 6.4 oz (72.8 kg)   Body mass index is 23.69 kg/m.  Physical Exam Constitutional:      Comments: His spirits are good.  Cardiovascular:     Rate and Rhythm: Normal rate and regular rhythm.     Heart sounds: No murmur heard.   Pulmonary:     Effort: Pulmonary effort is  normal.     Breath sounds: Normal breath sounds.  Abdominal:     Palpations: Abdomen is soft.     Tenderness: There is no abdominal tenderness.  Musculoskeletal:        General: No swelling or tenderness.  Skin:    Findings: No rash.  Neurological:     General: No focal deficit present.  Psychiatric:        Mood and Affect: Mood normal.     Lab Results Lab Results  Component Value Date   WBC 7.2 10/28/2019   HGB 13.1 (L) 10/28/2019   HCT 38.3 (L) 10/28/2019   MCV 88.2 10/28/2019   PLT 236 10/28/2019    Lab Results  Component Value Date   CREATININE 1.06 10/28/2019   BUN 20 10/28/2019   NA 136 10/28/2019   K 4.5 10/28/2019   CL 101 10/28/2019   CO2 27 10/28/2019    Lab Results  Component Value Date   ALT 15 10/28/2019   AST 16 10/28/2019   ALKPHOS 114 07/02/2018   BILITOT 0.4 10/28/2019    Lab Results  Component Value Date   CHOL 167 10/28/2019   HDL 33 (L) 10/28/2019   LDLCALC 108 (H) 10/28/2019   TRIG 146 10/28/2019   CHOLHDL 5.1 (H) 10/28/2019   Lab Results  Component Value Date   LABRPR REACTIVE (A) 10/28/2019   RPRTITER 1:4 (H) 10/28/2019   HIV 1 RNA Quant (copies/mL)  Date Value  11/19/2019 909 (H)  10/28/2019 1,930 (H)  05/20/2019 385 (H)   CD4 (/uL)  Date Value  08/01/2018 201 (L)   CD4 T Cell Abs (/uL)  Date Value  11/19/2019 198 (L)  10/28/2019 209 (L)  05/20/2019 337 (L)     Problem List Items Addressed This Visit      Medium   Human immunodeficiency virus (HIV) disease (HCC)    His infection was starting to come back under better control after restarting his medication about 3 weeks before his last visit.  I encouraged him to not miss any medications going forward.  I will check lab work today and see him back in 6 months.      Relevant Orders   T-helper cell (CD4)- ([facility] clinic only)   HIV-1 RNA quant-no reflex-bld   Depression    His chronic recurrent depression is in remission.      Anxiety state    His chronic  anxiety is a little worse recently due to conflict with his sister.  Urged him to meet with our behavioral health counselor but he declines at this time.  I encouraged him to  get regular, vigorous exercise.           [name], MD [facility] for Infectious Disease [facility] [phone] pager   [phone] cell 01/02/2020, 9:47 AM

## 2020-01-03 LAB — T-HELPER CELL (CD4) - (RCID CLINIC ONLY)
CD4 % Helper T Cell: 6 % — ABNORMAL LOW (ref 33–65)
CD4 T Cell Abs: 121 /uL — ABNORMAL LOW (ref 400–1790)

## 2020-01-04 LAB — HIV-1 RNA QUANT-NO REFLEX-BLD
HIV 1 RNA Quant: 257000 copies/mL — ABNORMAL HIGH
HIV-1 RNA Quant, Log: 5.41 Log copies/mL — ABNORMAL HIGH

## 2020-01-07 ENCOUNTER — Telehealth: Payer: Self-pay

## 2020-01-07 NOTE — Telephone Encounter (Signed)
Patient scheduled for 8/11 at 9:15. Patient unable to be seen sooner due to visiting family.  Nial Hawe Loyola Mast, RN

## 2020-01-07 NOTE — Telephone Encounter (Signed)
That works!

## 2020-01-07 NOTE — Telephone Encounter (Signed)
-----   Message from [name], MD sent at 01/07/2020 12:22 PM EDT ----- Please call tied and schedule a follow-up visit in my first open slot.  His viral load has gone up even further.

## 2020-01-14 ENCOUNTER — Other Ambulatory Visit: Payer: Self-pay | Admitting: Internal Medicine

## 2020-01-14 ENCOUNTER — Other Ambulatory Visit: Payer: Self-pay

## 2020-01-14 DIAGNOSIS — B20 Human immunodeficiency virus [HIV] disease: Secondary | ICD-10-CM

## 2020-01-14 MED ORDER — PREZCOBIX 800-150 MG PO TABS: ORAL_TABLET | ORAL | 4 refills | Status: DC

## 2020-01-14 MED ORDER — DESCOVY 200-25 MG PO TABS: 1.0000 | ORAL_TABLET | Freq: Every day | ORAL | 4 refills | Status: DC

## 2020-01-29 ENCOUNTER — Encounter: Payer: Self-pay | Admitting: Internal Medicine

## 2020-01-29 ENCOUNTER — Ambulatory Visit (INDEPENDENT_AMBULATORY_CARE_PROVIDER_SITE_OTHER): Payer: Medicare Other | Admitting: Internal Medicine

## 2020-01-29 ENCOUNTER — Other Ambulatory Visit: Payer: Self-pay

## 2020-01-29 DIAGNOSIS — B20 Human immunodeficiency virus [HIV] disease: Secondary | ICD-10-CM

## 2020-01-29 NOTE — Progress Notes (Signed)
Patient Active Problem List   Diagnosis Date Noted  . Former cigarette smoker 07/29/2008    Priority: Medium  . Human immunodeficiency virus (HIV) disease (HCC) 04/06/2006    Priority: Medium  . Anxiety state 04/06/2006    Priority: Medium  . Polysubstance abuse (HCC) 04/06/2006    Priority: Medium  . Depression 04/06/2006    Priority: Medium  . Hepatitis C 04/06/2006    Priority: Low  . 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 Normocytic anemia 07/01/2018  . DENTAL CARIES 04/06/2006  . ANAL FISSURE 04/06/2006    Patient's Medications  New Prescriptions   No medications on file  Previous Medications   AMOXICILLIN-CLAVULANATE (AUGMENTIN) 875-125 MG TABLET    Take 1 tablet by mouth 2 (two) times daily.   DARUNAVIR-COBICISTAT (PREZCOBIX) 800-150 MG TABLET    TAKE 1 TABLET BY MOUTH DAILY. SWALLOW WHOLE. DO NOT CRUSH, BREAK OR CHEW TABLETS.TK WITH FOOD   EMTRICITABINE-TENOFOVIR AF (DESCOVY) 200-25 MG TABLET    Take 1 tablet by mouth daily.   IBUPROFEN (ADVIL,MOTRIN) 200 MG TABLET    Take 800 mg by mouth every 6 (six) hours as needed (pain).   LORAZEPAM (ATIVAN) 2 MG TABLET    Take 1 tablet (2 mg total) by mouth 2 (two) times daily as needed. for anxiety   PAROXETINE (PAXIL) 40 MG TABLET    TAKE 1 TABLET BY MOUTH EVERY DAY   STUDY - REPRIEVE [id number] - PITAVASTATIN 4 MG OR PLACEBO TABLET    Take 1 tablet (4 mg total) by mouth daily.  Modified Medications   No medications on file  Discontinued Medications   No medications on file    Subjective: Jordan Calderon is in for his routine HIV follow-up visit.  He now admits that he lied to me at the time of his last visit and was not taking his Descovy or Prezcobix.  He says that [name] saw his viral load of 257,000 on my chart and confronted him.  He started back taking his medication regularly on 01/19/2020.  He has all of his medications, pill calendar and pillbox with him today.  He says that he has mostly been staying at home and does wear his  mask when he is out.  He has had his Covid vaccines.  His chronic anxiety is at baseline.  Review of Systems: Review of Systems  Psychiatric/Behavioral: Positive for depression. The patient is nervous/anxious.     Past Medical History:  Diagnosis Date  . Anxiety   . Cellulitis 02/11/2014   LEFT ARM  . Depression   . Headache(784.0)   . HIV (human immunodeficiency virus infection) (HCC)     Social History   Tobacco Use  . Smoking status: Former Smoker    Packs/day: 0.10    Years: 30.00    Pack years: 3.00    Types: Cigarettes    Quit date: 11/19/2014    Years since quitting: 5.1  . Smokeless tobacco: Never Used  Vaping Use  . Vaping Use: Never used  Substance Use Topics  . Alcohol use: No    Alcohol/week: 0.0 standard drinks  . Drug use: No    Family History  Adopted: Yes    Allergies  Allergen Reactions  . Sulfonamide Derivatives Itching    Health Maintenance  Topic Date Due  . TETANUS/TDAP  Never done  . COLONOSCOPY  Never done  . INFLUENZA VACCINE  01/19/2020  . COVID-19 Vaccine  Completed  . Hepatitis C  Screening  Completed  . HIV Screening  Completed    Objective:  Vitals:   01/29/20 0911  BP: 110/77  Pulse: 79  Temp: 98.1 F (36.7 C)  TempSrc: Oral  Weight: 165 lb (74.8 kg)   Body mass index is 24.37 kg/m.  Physical Exam Constitutional:      Comments: [name] is his usual nervous self.  He is self-deprecating today.  Cardiovascular:     Rate and Rhythm: Normal rate and regular rhythm.     Heart sounds: No murmur heard.   Pulmonary:     Effort: Pulmonary effort is normal.     Breath sounds: Normal breath sounds.     Lab Results Lab Results  Component Value Date   WBC 7.2 10/28/2019   HGB 13.1 (L) 10/28/2019   HCT 38.3 (L) 10/28/2019   MCV 88.2 10/28/2019   PLT 236 10/28/2019    Lab Results  Component Value Date   CREATININE 1.06 10/28/2019   BUN 20 10/28/2019   NA 136 10/28/2019   K 4.5 10/28/2019   CL 101 10/28/2019    CO2 27 10/28/2019    Lab Results  Component Value Date   ALT 15 10/28/2019   AST 16 10/28/2019   ALKPHOS 114 07/02/2018   BILITOT 0.4 10/28/2019    Lab Results  Component Value Date   CHOL 167 10/28/2019   HDL 33 (L) 10/28/2019   LDLCALC 108 (H) 10/28/2019   TRIG 146 10/28/2019   CHOLHDL 5.1 (H) 10/28/2019   Lab Results  Component Value Date   LABRPR REACTIVE (A) 10/28/2019   RPRTITER 1:4 (H) 10/28/2019   HIV 1 RNA Quant (copies/mL)  Date Value  01/02/2020 257,000 (H)  11/19/2019 909 (H)  10/28/2019 1,930 (H)   CD4 (/uL)  Date Value  08/01/2018 201 (L)   CD4 T Cell Abs (/uL)  Date Value  01/02/2020 121 (L)  11/19/2019 198 (L)  10/28/2019 209 (L)     Problem List Items Addressed This Visit      Medium   Human immunodeficiency virus (HIV) disease (HCC)    Jassiah continues the same pattern of on again off again adherence that he is followed over the past several decades.  He is back on now.  I will have him return in 4 weeks for follow-up lab work.           [name], MD [facility] for Infectious Disease [facility] [phone] pager   [phone] cell 01/29/2020, 9:31 AM

## 2020-01-29 NOTE — Assessment & Plan Note (Signed)
Juron continues the same pattern of on again off again adherence that he is followed over the past several decades.  He is back on now.  I will have him return in 4 weeks for follow-up lab work.

## 2020-02-27 ENCOUNTER — Ambulatory Visit: Payer: Medicare Other | Admitting: Internal Medicine

## 2020-03-09 ENCOUNTER — Other Ambulatory Visit: Payer: Self-pay | Admitting: *Deleted

## 2020-03-09 ENCOUNTER — Encounter: Payer: Self-pay | Admitting: *Deleted

## 2020-03-09 ENCOUNTER — Other Ambulatory Visit: Payer: Self-pay

## 2020-03-09 ENCOUNTER — Other Ambulatory Visit (INDEPENDENT_AMBULATORY_CARE_PROVIDER_SITE_OTHER): Payer: Self-pay

## 2020-03-09 VITALS — BP 145/86 | HR 74 | Temp 97.6°F | Wt 167.5 lb

## 2020-03-09 DIAGNOSIS — B20 Human immunodeficiency virus [HIV] disease: Secondary | ICD-10-CM

## 2020-03-09 DIAGNOSIS — Z006 Encounter for examination for normal comparison and control in clinical research program: Secondary | ICD-10-CM

## 2020-03-09 NOTE — Research (Signed)
Jordan Calderon is here for his month 83 Reprieve visit. No new complaints.States that he had been off his ARV medication for quite a while until the beginning of August. Seen by Dr. Orvan Calderon at that time and his VL was 257,000. Jordan Calderon states that he has been very adherent and has been taking his medication consistently since the begining of August. He had clinic labs drawn today. Denied any muscle aches or weakness. No new medications. He has received both doses of the Pfizer Covid vaccine and is interested in getting the booster. He will return in January for his next study visit.

## 2020-03-10 LAB — T-HELPER CELL (CD4) - (RCID CLINIC ONLY)
CD4 % Helper T Cell: 9 % — ABNORMAL LOW (ref 33–65)
CD4 T Cell Abs: 182 /uL — ABNORMAL LOW (ref 400–1790)

## 2020-03-11 LAB — HIV-1 RNA QUANT-NO REFLEX-BLD
HIV 1 RNA Quant: 638 Copies/mL — ABNORMAL HIGH
HIV-1 RNA Quant, Log: 2.8 Log cps/mL — ABNORMAL HIGH

## 2020-04-20 ENCOUNTER — Telehealth: Payer: Self-pay | Admitting: *Deleted

## 2020-04-20 NOTE — Telephone Encounter (Signed)
Patient would like to come to [facility] for pfizer booster and flu shot - scheduled 11/5. Both doses same day due to transportation issues. He has questions about shingles vaccine, will discuss at next visit in January with Dr Orvan Falconer. Patient states he is doing the best he ever has on his medication. He would like refills of ativan.  OK to send in refills? Andree Coss, RN

## 2020-04-21 ENCOUNTER — Other Ambulatory Visit: Payer: Self-pay | Admitting: Internal Medicine

## 2020-04-21 DIAGNOSIS — F411 Generalized anxiety disorder: Secondary | ICD-10-CM

## 2020-04-21 MED ORDER — LORAZEPAM 2 MG PO TABS: 2.0000 mg | ORAL_TABLET | Freq: Two times a day (BID) | ORAL | 5 refills | Status: DC | PRN

## 2020-04-21 NOTE — Telephone Encounter (Signed)
Thank you :)

## 2020-04-21 NOTE — Telephone Encounter (Signed)
Done. Thanks.

## 2020-04-24 ENCOUNTER — Ambulatory Visit (INDEPENDENT_AMBULATORY_CARE_PROVIDER_SITE_OTHER): Payer: Medicare Other

## 2020-04-24 ENCOUNTER — Other Ambulatory Visit: Payer: Self-pay

## 2020-04-24 DIAGNOSIS — Z23 Encounter for immunization: Secondary | ICD-10-CM

## 2020-04-24 NOTE — Progress Notes (Signed)
   Covid-19 Vaccination Clinic  Name:  DERYL GIROUX    MRN: [id number] DOB: 04-01-1961  04/24/2020  Mr. Phenix was observed post Covid-19 immunization for 15 minutes without incident. He was provided with Vaccine Information Sheet and instruction to access the V-Safe system.   Mr. Abebe was instructed to call 911 with any severe reactions post vaccine: Marland Kitchen Difficulty breathing  . Swelling of face and throat  . A fast heartbeat  . A bad rash all over body  . Dizziness and weakness     Serenna Deroy T Pricilla Loveless

## 2020-04-25 ENCOUNTER — Other Ambulatory Visit: Payer: Self-pay | Admitting: Internal Medicine

## 2020-04-25 DIAGNOSIS — B20 Human immunodeficiency virus [HIV] disease: Secondary | ICD-10-CM

## 2020-04-25 DIAGNOSIS — F4310 Post-traumatic stress disorder, unspecified: Secondary | ICD-10-CM

## 2020-04-27 ENCOUNTER — Other Ambulatory Visit: Payer: Self-pay

## 2020-04-27 DIAGNOSIS — B20 Human immunodeficiency virus [HIV] disease: Secondary | ICD-10-CM

## 2020-04-27 DIAGNOSIS — F4310 Post-traumatic stress disorder, unspecified: Secondary | ICD-10-CM

## 2020-04-27 MED ORDER — PREZCOBIX 800-150 MG PO TABS: ORAL_TABLET | ORAL | 2 refills | Status: DC

## 2020-04-27 MED ORDER — DESCOVY 200-25 MG PO TABS: 1.0000 | ORAL_TABLET | Freq: Every day | ORAL | 2 refills | Status: DC

## 2020-04-27 MED ORDER — PAROXETINE HCL 40 MG PO TABS: 40.0000 mg | ORAL_TABLET | Freq: Every day | ORAL | 2 refills | Status: DC

## 2020-07-07 ENCOUNTER — Ambulatory Visit: Payer: Medicare Other | Admitting: Internal Medicine

## 2020-07-07 ENCOUNTER — Encounter: Payer: Medicare Other | Admitting: *Deleted

## 2020-07-29 NOTE — Assessment & Plan Note (Signed)
I have followed Jordan Calderon for longstanding HIV infection.  His adherence with his antiretroviral medications has waxed and waned over time.  He suffers from chronic anxiety and depression and has had intermittent bouts of polysubstance abuse in the past.  His adherence is suffers when these surface again or get worse.  He rents a house from his sister, [name].  His adherence also suffers when he is arguing with [name].  Currently he is infection is doing reasonably well although he has had some decline in his CD4 count.  He is on Descovy and Prezcobix.  He does not have a PCP.  For the past several decades I have prescribed his paroxetine and a little dose lorazepam.

## 2020-07-30 ENCOUNTER — Ambulatory Visit (INDEPENDENT_AMBULATORY_CARE_PROVIDER_SITE_OTHER): Payer: Medicare Other | Admitting: Internal Medicine

## 2020-07-30 ENCOUNTER — Encounter (INDEPENDENT_AMBULATORY_CARE_PROVIDER_SITE_OTHER): Payer: Self-pay | Admitting: *Deleted

## 2020-07-30 ENCOUNTER — Other Ambulatory Visit: Payer: Self-pay

## 2020-07-30 VITALS — BP 136/83 | HR 84 | Temp 97.5°F | Wt 160.1 lb

## 2020-07-30 DIAGNOSIS — F3340 Major depressive disorder, recurrent, in remission, unspecified: Secondary | ICD-10-CM | POA: Diagnosis not present

## 2020-07-30 DIAGNOSIS — F1910 Other psychoactive substance abuse, uncomplicated: Secondary | ICD-10-CM

## 2020-07-30 DIAGNOSIS — B20 Human immunodeficiency virus [HIV] disease: Secondary | ICD-10-CM | POA: Diagnosis not present

## 2020-07-30 DIAGNOSIS — A5149 Other secondary syphilitic conditions: Secondary | ICD-10-CM | POA: Diagnosis not present

## 2020-07-30 DIAGNOSIS — B182 Chronic viral hepatitis C: Secondary | ICD-10-CM

## 2020-07-30 DIAGNOSIS — Z006 Encounter for examination for normal comparison and control in clinical research program: Secondary | ICD-10-CM

## 2020-07-30 NOTE — Assessment & Plan Note (Signed)
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

## 2020-07-30 NOTE — Research (Signed)
Jordan Calderon here today for his month 65 Reprieve visit. No new complaints. Verbalized excellent adherence with his ARV and study mediations. Denied any muscle aches or weakness. He saw Dr. Orvan Falconer today for his routine clinic visit. He will return in May for his next study visit.

## 2020-07-30 NOTE — Assessment & Plan Note (Signed)
He is feeling better and his adherence with antiretroviral therapy has greatly improved coincident with abstinence from polysubstance abuse.

## 2020-07-30 NOTE — Assessment & Plan Note (Signed)
His chronic depression and anxiety are currently in remission on therapy.

## 2020-07-30 NOTE — Progress Notes (Signed)
Patient Active Problem List   Diagnosis Date Noted  . Human immunodeficiency virus (HIV) disease (HCC) 04/06/2006    Priority: Medium  . Anxiety state 04/06/2006    Priority: Medium  . Polysubstance abuse (HCC) 04/06/2006    Priority: Medium  . Depression 04/06/2006    Priority: Medium  . Hepatitis C 04/06/2006    Priority: Low  . 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 Normocytic anemia 07/01/2018  . Former cigarette smoker 07/29/2008  . DENTAL CARIES 04/06/2006  . ANAL FISSURE 04/06/2006    Patient's Medications  New Prescriptions   No medications on file  Previous Medications   AMOXICILLIN-CLAVULANATE (AUGMENTIN) 875-125 MG TABLET    Take 1 tablet by mouth 2 (two) times daily.   DARUNAVIR-COBICISTAT (PREZCOBIX) 800-150 MG TABLET    TAKE 1 TABLET BY MOUTH DAILY. SWALLOW WHOLE. DO NOT CRUSH, BREAK OR CHEW TABLETS.TK WITH FOOD   EMTRICITABINE-TENOFOVIR AF (DESCOVY) 200-25 MG TABLET    Take 1 tablet by mouth daily.   IBUPROFEN (ADVIL,MOTRIN) 200 MG TABLET    Take 800 mg by mouth every 6 (six) hours as needed (pain).   LORAZEPAM (ATIVAN) 2 MG TABLET    Take 1 tablet (2 mg total) by mouth 2 (two) times daily as needed. for anxiety   PAROXETINE (PAXIL) 40 MG TABLET    Take 1 tablet (40 mg total) by mouth daily.   STUDY - REPRIEVE [phone] - PITAVASTATIN 4 MG OR PLACEBO TABLET    Take 1 tablet (4 mg total) by mouth daily.  Modified Medications   No medications on file  Discontinued Medications   No medications on file    Subjective: Jordan Calderon is in for his routine HIV follow-up visit.  He is feeling better over the past several months.  He says that he is getting along well with his sister, Jordan Calderon.  He has not had any problems obtaining, taking or tolerating his Descovy or Prezcobix.  He is using his pillbox.  He thinks he missed his medications on two occasions since his last visit.  These occurred when he took his medication out of his pillbox and set them on the kitchen table but  forgot to take them.  He states that his chronic depression and anxiety are under good control currently.  He has not had any relapses of drug use over the past year.  He wants to know if he would be a candidate for the new injectable medication, Cabenuva.  Review of Systems: Review of Systems  Constitutional: Negative for chills, diaphoresis, fever and weight loss.  Respiratory: Negative for cough and shortness of breath.   Cardiovascular: Negative for chest pain.  Gastrointestinal: Negative for abdominal pain, diarrhea, nausea and vomiting.  Psychiatric/Behavioral: Negative for depression and substance abuse. The patient is not nervous/anxious.     Past Medical History:  Diagnosis Date  . Anxiety   . Cellulitis 02/11/2014   LEFT ARM  . Depression   . Headache(784.0)   . HIV (human immunodeficiency virus infection) (HCC)     Social History   Tobacco Use  . Smoking status: Former Smoker    Packs/day: 0.10    Years: 30.00    Pack years: 3.00    Types: Cigarettes    Quit date: 11/19/2014    Years since quitting: 5.6  . Smokeless tobacco: Never Used  Vaping Use  . Vaping Use: Never used  Substance Use Topics  . Alcohol use: No    Alcohol/week:  0.0 standard drinks  . Drug use: No    Family History  Adopted: Yes    Allergies  Allergen Reactions  . Sulfonamide Derivatives Itching    Health Maintenance  Topic Date Due  . TETANUS/TDAP  Never done  . COLONOSCOPY (Pts 45-73yrs Insurance coverage will need to be confirmed)  Never done  . COVID-19 Vaccine (4 - Booster) 10/22/2020  . INFLUENZA VACCINE  Completed  . Hepatitis C Screening  Completed  . HIV Screening  Completed    Objective:  There were no vitals filed for this visit. There is no height or weight on file to calculate BMI.  Physical Exam Constitutional:      Comments: His spirits are very good today.  He has all of his medication bottles with him.  Cardiovascular:     Rate and Rhythm: Normal rate and  regular rhythm.     Heart sounds: No murmur heard.   Pulmonary:     Effort: Pulmonary effort is normal.     Breath sounds: Normal breath sounds.  Abdominal:     Palpations: Abdomen is soft.     Tenderness: There is no abdominal tenderness.  Musculoskeletal:        General: No swelling or tenderness.  Skin:    Findings: No rash.  Neurological:     General: No focal deficit present.  Psychiatric:        Mood and Affect: Mood normal.     Lab Results Lab Results  Component Value Date   WBC 7.2 10/28/2019   HGB 13.1 (L) 10/28/2019   HCT 38.3 (L) 10/28/2019   MCV 88.2 10/28/2019   PLT 236 10/28/2019    Lab Results  Component Value Date   CREATININE 1.06 10/28/2019   BUN 20 10/28/2019   NA 136 10/28/2019   K 4.5 10/28/2019   CL 101 10/28/2019   CO2 27 10/28/2019    Lab Results  Component Value Date   ALT 15 10/28/2019   AST 16 10/28/2019   ALKPHOS 114 07/02/2018   BILITOT 0.4 10/28/2019    Lab Results  Component Value Date   CHOL 167 10/28/2019   HDL 33 (L) 10/28/2019   LDLCALC 108 (H) 10/28/2019   TRIG 146 10/28/2019   CHOLHDL 5.1 (H) 10/28/2019   Lab Results  Component Value Date   LABRPR REACTIVE (A) 10/28/2019   RPRTITER 1:4 (H) 10/28/2019   HIV 1 RNA Quant  Date Value  03/09/2020 638 Copies/mL (H)  01/02/2020 257,000 copies/mL (H)  11/19/2019 909 copies/mL (H)   CD4 (/uL)  Date Value  08/01/2018 201 (L)   CD4 T Cell Abs (/uL)  Date Value  03/09/2020 182 (L)  01/02/2020 121 (L)  11/19/2019 198 (L)     Problem List Items Addressed This Visit      Medium   Human immunodeficiency virus (HIV) disease (HCC)    Because adherence has improved dramatically over the past 6 months and his infection is coming back under better control.  He will continue Descovy and Prezcobix and get repeat lab work today.  He will follow-up in 3 months.      Relevant Orders   T-helper cell (CD4)- ([facility] clinic only)   HIV-1 RNA quant-no reflex-bld   CBC    Comprehensive metabolic panel   RPR   Polysubstance abuse (HCC)    He is feeling better and his adherence with antiretroviral therapy has greatly improved coincident with abstinence from polysubstance abuse.      Depression  His chronic depression and anxiety are currently in remission on therapy.        Low   Hepatitis C     Unprioritized   Secondary syphilis    His RPR remains serofast at a low stable titer of 1:1 following treatment for secondary syphilis.           [name], MD [facility] for Infectious Disease [facility] [phone] pager   [phone] cell 07/30/2020, 1:11 PM

## 2020-07-30 NOTE — Assessment & Plan Note (Signed)
Because adherence has improved dramatically over the past 6 months and his infection is coming back under better control.  He will continue Descovy and Prezcobix and get repeat lab work today.  He will follow-up in 3 months.

## 2020-07-31 LAB — T-HELPER CELL (CD4) - (RCID CLINIC ONLY)
CD4 % Helper T Cell: 12 % — ABNORMAL LOW (ref 33–65)
CD4 T Cell Abs: 123 /uL — ABNORMAL LOW (ref 400–1790)

## 2020-08-03 LAB — CBC
HCT: 38.3 % — ABNORMAL LOW (ref 38.5–50.0)
Hemoglobin: 13.5 g/dL (ref 13.2–17.1)
MCH: 32.2 pg (ref 27.0–33.0)
MCHC: 35.2 g/dL (ref 32.0–36.0)
MCV: 91.4 fL (ref 80.0–100.0)
MPV: 9.4 fL (ref 7.5–12.5)
Platelets: 245 10*3/uL (ref 140–400)
RBC: 4.19 10*6/uL — ABNORMAL LOW (ref 4.20–5.80)
RDW: 12.8 % (ref 11.0–15.0)
WBC: 6.4 10*3/uL (ref 3.8–10.8)

## 2020-08-03 LAB — COMPREHENSIVE METABOLIC PANEL
AG Ratio: 1.3 (calc) (ref 1.0–2.5)
ALT: 12 U/L (ref 9–46)
AST: 19 U/L (ref 10–35)
Albumin: 4.3 g/dL (ref 3.6–5.1)
Alkaline phosphatase (APISO): 79 U/L (ref 35–144)
BUN: 23 mg/dL (ref 7–25)
CO2: 29 mmol/L (ref 20–32)
Calcium: 9.3 mg/dL (ref 8.6–10.3)
Chloride: 103 mmol/L (ref 98–110)
Creat: 0.99 mg/dL (ref 0.70–1.25)
Globulin: 3.2 g/dL (calc) (ref 1.9–3.7)
Glucose, Bld: 64 mg/dL — ABNORMAL LOW (ref 65–99)
Potassium: 4 mmol/L (ref 3.5–5.3)
Sodium: 137 mmol/L (ref 135–146)
Total Bilirubin: 0.7 mg/dL (ref 0.2–1.2)
Total Protein: 7.5 g/dL (ref 6.1–8.1)

## 2020-08-03 LAB — HIV-1 RNA QUANT-NO REFLEX-BLD
HIV 1 RNA Quant: 57 Copies/mL — ABNORMAL HIGH
HIV-1 RNA Quant, Log: 1.76 Log cps/mL — ABNORMAL HIGH

## 2020-08-03 LAB — RPR: RPR Ser Ql: REACTIVE — AB

## 2020-08-03 LAB — FLUORESCENT TREPONEMAL AB(FTA)-IGG-BLD: Fluorescent Treponemal ABS: REACTIVE — AB

## 2020-08-03 LAB — RPR TITER: RPR Titer: 1:2 {titer} — ABNORMAL HIGH

## 2020-10-06 ENCOUNTER — Other Ambulatory Visit: Payer: Self-pay | Admitting: Internal Medicine

## 2020-10-06 DIAGNOSIS — F4310 Post-traumatic stress disorder, unspecified: Secondary | ICD-10-CM

## 2020-10-21 ENCOUNTER — Other Ambulatory Visit (HOSPITAL_COMMUNITY): Payer: Self-pay

## 2020-10-21 MED ORDER — STUDY - REPRIEVE A5332 - PITAVASTATIN 4MG OR PLACEBO TABLET (PI-VAN DAM)
ORAL_TABLET | ORAL | 0 refills | Status: DC
  Filled 2020-10-21: qty 90, 90d supply, fill #0

## 2020-10-22 ENCOUNTER — Encounter: Payer: Medicare Other | Admitting: *Deleted

## 2020-10-22 ENCOUNTER — Ambulatory Visit: Payer: Medicare Other | Admitting: Internal Medicine

## 2020-10-29 ENCOUNTER — Ambulatory Visit: Payer: Medicare Other | Admitting: Internal Medicine

## 2020-11-18 ENCOUNTER — Ambulatory Visit: Payer: Medicare Other | Admitting: Internal Medicine

## 2020-11-18 ENCOUNTER — Encounter: Payer: Medicare Other | Admitting: *Deleted

## 2020-11-20 ENCOUNTER — Other Ambulatory Visit (HOSPITAL_COMMUNITY): Payer: Self-pay

## 2020-11-20 ENCOUNTER — Encounter (INDEPENDENT_AMBULATORY_CARE_PROVIDER_SITE_OTHER): Payer: Self-pay | Admitting: *Deleted

## 2020-11-20 ENCOUNTER — Other Ambulatory Visit: Payer: Self-pay

## 2020-11-20 VITALS — BP 110/78 | HR 71 | Temp 97.6°F | Wt 155.2 lb

## 2020-11-20 DIAGNOSIS — Z006 Encounter for examination for normal comparison and control in clinical research program: Secondary | ICD-10-CM

## 2020-11-20 NOTE — Research (Signed)
Luster here for his month 89 Reprieve visit. No new complaints or medications. States that he has not taken his Prescobix or Descovy since about February. We discussed the barriers to taking his medication along with strategies to try and improve adherence. States that he really wants to do better but continues to have difficulty taking his ARV's consistently. He is scheduled to see Dr. Orvan Falconer next week. He will return in September for his next study visit.

## 2020-11-25 ENCOUNTER — Encounter: Payer: Self-pay | Admitting: Internal Medicine

## 2020-11-25 ENCOUNTER — Other Ambulatory Visit: Payer: Self-pay

## 2020-11-25 ENCOUNTER — Ambulatory Visit (INDEPENDENT_AMBULATORY_CARE_PROVIDER_SITE_OTHER): Payer: Medicare Other | Admitting: Internal Medicine

## 2020-11-25 DIAGNOSIS — F411 Generalized anxiety disorder: Secondary | ICD-10-CM

## 2020-11-25 DIAGNOSIS — B20 Human immunodeficiency virus [HIV] disease: Secondary | ICD-10-CM | POA: Diagnosis not present

## 2020-11-25 DIAGNOSIS — F4310 Post-traumatic stress disorder, unspecified: Secondary | ICD-10-CM

## 2020-11-25 DIAGNOSIS — F331 Major depressive disorder, recurrent, moderate: Secondary | ICD-10-CM | POA: Diagnosis not present

## 2020-11-25 DIAGNOSIS — F1910 Other psychoactive substance abuse, uncomplicated: Secondary | ICD-10-CM

## 2020-11-25 MED ORDER — LORAZEPAM 2 MG PO TABS: 2.0000 mg | ORAL_TABLET | Freq: Two times a day (BID) | ORAL | 5 refills | Status: DC | PRN

## 2020-11-25 MED ORDER — PREZCOBIX 800-150 MG PO TABS: ORAL_TABLET | ORAL | 11 refills | Status: DC

## 2020-11-25 MED ORDER — DESCOVY 200-25 MG PO TABS: 1.0000 | ORAL_TABLET | Freq: Every day | ORAL | 11 refills | Status: DC

## 2020-11-25 MED ORDER — PAROXETINE HCL 40 MG PO TABS: ORAL_TABLET | ORAL | 5 refills | Status: DC

## 2020-11-25 MED ORDER — DAPSONE 100 MG PO TABS: 100.0000 mg | ORAL_TABLET | Freq: Every day | ORAL | 11 refills | Status: DC

## 2020-11-25 NOTE — Progress Notes (Signed)
Patient Active Problem List   Diagnosis Date Noted  . Human immunodeficiency virus (HIV) disease (HCC) 04/06/2006    Priority: Medium  . Anxiety state 04/06/2006    Priority: Medium  . Polysubstance abuse (HCC) 04/06/2006    Priority: Medium  . Depression 04/06/2006    Priority: Medium  . Hepatitis C 04/06/2006    Priority: Low  . 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 Normocytic anemia 07/01/2018  . Former cigarette smoker 07/29/2008  . DENTAL CARIES 04/06/2006  . ANAL FISSURE 04/06/2006    Patient's Medications  New Prescriptions   DAPSONE 100 MG TABLET    Take 1 tablet (100 mg total) by mouth daily.  Previous Medications   DARUNAVIR-COBICISTAT (PREZCOBIX) 800-150 MG TABLET    TAKE 1 TABLET BY MOUTH DAILY. SWALLOW WHOLE. DO NOT CRUSH, BREAK OR CHEW TABLETS.TK WITH FOOD   EMTRICITABINE-TENOFOVIR AF (DESCOVY) 200-25 MG TABLET    Take 1 tablet by mouth daily.   IBUPROFEN (ADVIL,MOTRIN) 200 MG TABLET    Take 800 mg by mouth every 6 (six) hours as needed (pain).   LORAZEPAM (ATIVAN) 2 MG TABLET    Take 1 tablet (2 mg total) by mouth 2 (two) times daily as needed. for anxiety   PAROXETINE (PAXIL) 40 MG TABLET    TAKE 1 TABLET(40 MG) BY MOUTH DAILY   STUDY - REPRIEVE [id number] - PITAVASTATIN 4 MG OR PLACEBO TABLET    Take 1 tablet (4 mg total) by mouth daily.   STUDY - REPRIEVE [phone] - PITAVASTATIN 4 MG OR PLACEBO TABLET (PI-VAN DAM)    Take one tablet by mouth once daily.  [id number] ACTG A5332  Modified Medications   No medications on file  Discontinued Medications   AMOXICILLIN-CLAVULANATE (AUGMENTIN) 875-125 MG TABLET    Take 1 tablet by mouth 2 (two) times daily.    Subjective: Jordan Calderon is in for his routine HIV follow-up visit.  After his last visit here he began using pot, methamphetamine and cocaine again.  He says that he fell in with the wrong crowd.  He said that he quit drugs again recently and restarted his Descovy and Prezcobix earlier this week.  He says  that his relationship with his sister, [name], is better and that is why he is back to see me today.  He was feeling more depressed while he was using drugs but is already feeling better since he stopped.  Review of Systems: Review of Systems  Constitutional: Positive for malaise/fatigue and weight loss. Negative for fever.  Respiratory: Negative for cough and shortness of breath.   Cardiovascular: Negative for chest pain.  Gastrointestinal: Negative for abdominal pain, diarrhea, nausea and vomiting.  Skin: Negative for rash.  Psychiatric/Behavioral: Positive for depression and substance abuse. Negative for suicidal ideas. The patient is nervous/anxious.     Past Medical History:  Diagnosis Date  . Anxiety   . Cellulitis 02/11/2014   LEFT ARM  . Depression   . Headache(784.0)   . HIV (human immunodeficiency virus infection) (HCC)     Social History   Tobacco Use  . Smoking status: Former Smoker    Packs/day: 0.10    Years: 30.00    Pack years: 3.00    Types: Cigarettes    Quit date: 11/19/2014    Years since quitting: 6.0  . Smokeless tobacco: Never Used  Vaping Use  . Vaping Use: Never used  Substance Use Topics  . Alcohol use: No  Alcohol/week: 0.0 standard drinks  . Drug use: No    Family History  Adopted: Yes    Allergies  Allergen Reactions  . Sulfonamide Derivatives Itching    Health Maintenance  Topic Date Due  . Pneumococcal Vaccine 68-67 Years old (1 of 4 - PCV13) Never done  . TETANUS/TDAP  Never done  . COLONOSCOPY (Pts 45-3yrs Insurance coverage will need to be confirmed)  Never done  . Zoster Vaccines- Shingrix (1 of 2) Never done  . COVID-19 Vaccine (4 - Booster) 07/25/2020  . INFLUENZA VACCINE  01/18/2021  . Hepatitis C Screening  Completed  . HIV Screening  Completed  . HPV VACCINES  Aged Out    Objective:  Vitals:   11/25/20 0941  BP: 127/83  Pulse: 69  Temp: 98.4 F (36.9 C)  TempSrc: Oral  SpO2: 99%  Weight: 155 lb (70.3 kg)   Height: 5\' 9"  (1.753 m)   Body mass index is 22.89 kg/m.  Physical Exam Constitutional:      Comments: He is anxious as usual.  His weight is down 12 pounds since September of last year.  Cardiovascular:     Rate and Rhythm: Normal rate and regular rhythm.     Heart sounds: No murmur heard.   Pulmonary:     Effort: Pulmonary effort is normal.     Breath sounds: Normal breath sounds.  Abdominal:     Palpations: Abdomen is soft.     Tenderness: There is no abdominal tenderness.  Musculoskeletal:        General: No swelling or tenderness.  Skin:    Findings: No rash.     Lab Results Lab Results  Component Value Date   WBC 6.4 07/30/2020   HGB 13.5 07/30/2020   HCT 38.3 (L) 07/30/2020   MCV 91.4 07/30/2020   PLT 245 07/30/2020    Lab Results  Component Value Date   CREATININE 0.99 07/30/2020   BUN 23 07/30/2020   NA 137 07/30/2020   K 4.0 07/30/2020   CL 103 07/30/2020   CO2 29 07/30/2020    Lab Results  Component Value Date   ALT 12 07/30/2020   AST 19 07/30/2020   ALKPHOS 114 07/02/2018   BILITOT 0.7 07/30/2020    Lab Results  Component Value Date   CHOL 167 10/28/2019   HDL 33 (L) 10/28/2019   LDLCALC 108 (H) 10/28/2019   TRIG 146 10/28/2019   CHOLHDL 5.1 (H) 10/28/2019   Lab Results  Component Value Date   LABRPR REACTIVE (A) 07/30/2020   RPRTITER 1:2 (H) 07/30/2020   HIV 1 RNA Quant  Date Value  07/30/2020 57 Copies/mL (H)  03/09/2020 638 Copies/mL (H)  01/02/2020 257,000 copies/mL (H)   CD4 (/uL)  Date Value  08/01/2018 201 (L)   CD4 T Cell Abs (/uL)  Date Value  07/30/2020 123 (L)  03/09/2020 182 (L)  01/02/2020 121 (L)     Problem List Items Addressed This Visit      Medium   Human immunodeficiency virus (HIV) disease (HCC)    He continues his return of stopping antiretroviral therapy whenever he starts using drugs again.  This has been his pattern for the past 3 decades and has resolved his CD4 count has continued to decline.   Back on prophylactic dapsone.  New baseline lab work today and see him back in 6 weeks.      Relevant Medications   dapsone 100 MG tablet   Other Relevant Orders  T-helper cell (CD4)- ([facility] clinic only)   HIV-1 RNA quant-no reflex-bld   CBC   Comprehensive metabolic panel   RPR   Polysubstance abuse (HCC)    He has had another relapse with polysubstance abuse.  him again thI told at if he relapses in the future I always want him to continue his clinic visits here and to stay on his antiretroviral medications.      Depression    As usual, his depression and anxiety exacerbate when he starts using drugs again.  He will follow-up with our behavioral health counselor.           [name], MD [facility] for Infectious Disease [facility] [phone] pager   [phone] cell 11/25/2020, 9:43 AM

## 2020-11-25 NOTE — Assessment & Plan Note (Signed)
He continues his return of stopping antiretroviral therapy whenever he starts using drugs again.  This has been his pattern for the past 3 decades and has resolved his CD4 count has continued to decline.  Back on prophylactic dapsone.  New baseline lab work today and see him back in 6 weeks.

## 2020-11-25 NOTE — Assessment & Plan Note (Signed)
He has had another relapse with polysubstance abuse.  him again thI told at if he relapses in the future I always want him to continue his clinic visits here and to stay on his antiretroviral medications.

## 2020-11-25 NOTE — Assessment & Plan Note (Signed)
As usual, his depression and anxiety exacerbate when he starts using drugs again.  He will follow-up with our behavioral health counselor.

## 2020-11-25 NOTE — Addendum Note (Signed)
Addended by: [name] on: 11/25/2020 11:00 AM   Modules accepted: Orders

## 2020-11-26 LAB — T-HELPER CELL (CD4) - (RCID CLINIC ONLY)
CD4 % Helper T Cell: 4 % — ABNORMAL LOW (ref 33–65)
CD4 T Cell Abs: 80 /uL — ABNORMAL LOW (ref 400–1790)

## 2020-11-28 LAB — COMPREHENSIVE METABOLIC PANEL
AG Ratio: 1 (calc) (ref 1.0–2.5)
ALT: 38 U/L (ref 9–46)
AST: 33 U/L (ref 10–35)
Albumin: 4.2 g/dL (ref 3.6–5.1)
Alkaline phosphatase (APISO): 84 U/L (ref 35–144)
BUN: 22 mg/dL (ref 7–25)
CO2: 27 mmol/L (ref 20–32)
Calcium: 8.9 mg/dL (ref 8.6–10.3)
Chloride: 104 mmol/L (ref 98–110)
Creat: 1.03 mg/dL (ref 0.70–1.25)
Globulin: 4.1 g/dL (calc) — ABNORMAL HIGH (ref 1.9–3.7)
Glucose, Bld: 74 mg/dL (ref 65–99)
Potassium: 5.2 mmol/L (ref 3.5–5.3)
Sodium: 136 mmol/L (ref 135–146)
Total Bilirubin: 0.4 mg/dL (ref 0.2–1.2)
Total Protein: 8.3 g/dL — ABNORMAL HIGH (ref 6.1–8.1)

## 2020-11-28 LAB — CBC
HCT: 41.2 % (ref 38.5–50.0)
Hemoglobin: 14 g/dL (ref 13.2–17.1)
MCH: 29.9 pg (ref 27.0–33.0)
MCHC: 34 g/dL (ref 32.0–36.0)
MCV: 88 fL (ref 80.0–100.0)
MPV: 9.7 fL (ref 7.5–12.5)
Platelets: 166 10*3/uL (ref 140–400)
RBC: 4.68 10*6/uL (ref 4.20–5.80)
RDW: 13.1 % (ref 11.0–15.0)
WBC: 6 10*3/uL (ref 3.8–10.8)

## 2020-11-28 LAB — HIV-1 RNA QUANT-NO REFLEX-BLD
HIV 1 RNA Quant: 102000 Copies/mL — ABNORMAL HIGH
HIV-1 RNA Quant, Log: 5.01 Log cps/mL — ABNORMAL HIGH

## 2020-11-28 LAB — FLUORESCENT TREPONEMAL AB(FTA)-IGG-BLD: Fluorescent Treponemal ABS: REACTIVE — AB

## 2020-11-28 LAB — RPR: RPR Ser Ql: REACTIVE — AB

## 2020-11-28 LAB — RPR TITER: RPR Titer: 1:2 {titer} — ABNORMAL HIGH

## 2020-11-30 ENCOUNTER — Other Ambulatory Visit: Payer: Self-pay | Admitting: Infectious Diseases

## 2020-11-30 DIAGNOSIS — B20 Human immunodeficiency virus [HIV] disease: Secondary | ICD-10-CM

## 2020-12-08 ENCOUNTER — Ambulatory Visit: Payer: Medicare Other

## 2021-01-07 ENCOUNTER — Ambulatory Visit (INDEPENDENT_AMBULATORY_CARE_PROVIDER_SITE_OTHER): Payer: Medicare Other | Admitting: Internal Medicine

## 2021-01-07 ENCOUNTER — Other Ambulatory Visit: Payer: Self-pay

## 2021-01-07 DIAGNOSIS — B20 Human immunodeficiency virus [HIV] disease: Secondary | ICD-10-CM | POA: Diagnosis not present

## 2021-01-07 DIAGNOSIS — A5149 Other secondary syphilitic conditions: Secondary | ICD-10-CM

## 2021-01-07 DIAGNOSIS — F3342 Major depressive disorder, recurrent, in full remission: Secondary | ICD-10-CM | POA: Diagnosis not present

## 2021-01-07 DIAGNOSIS — F1910 Other psychoactive substance abuse, uncomplicated: Secondary | ICD-10-CM

## 2021-01-07 NOTE — Assessment & Plan Note (Signed)
His depression is in remission and his anxiety has improved.  He can see a direct correlation between his drug use and his mood.

## 2021-01-07 NOTE — Assessment & Plan Note (Signed)
He is doing much better coincident with stopping drugs and alcohol recently.

## 2021-01-07 NOTE — Assessment & Plan Note (Signed)
He has a very long history of cycling on and off his antiretroviral meds occasions when he starts using drugs and alcohol again.  Over time this is caused a progressive decline in his CD4 count.  His adherence is better since becoming sober again recently.  I will repeat blood work today.  He will continue Descovy, Prezcobix and dapsone and follow-up in 3 months.

## 2021-01-07 NOTE — Progress Notes (Signed)
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   DARUNAVIR-COBICISTAT (PREZCOBIX) 800-150 MG TABLET    TAKE 1 TABLET BY MOUTH DAILY. SWALLOW WHOLE. DO NOT CRUSH, BREAK OR CHEW TABLETS.TK WITH FOOD   EMTRICITABINE-TENOFOVIR AF (DESCOVY) 200-25 MG TABLET    Take 1 tablet by mouth daily.   IBUPROFEN (ADVIL,MOTRIN) 200 MG TABLET    Take 800 mg by mouth every 6 (six) hours as needed (pain).   LORAZEPAM (ATIVAN) 2 MG TABLET    Take 1 tablet (2 mg total) by mouth 2 (two) times daily as needed. for anxiety   PAROXETINE (PAXIL) 40 MG TABLET    TAKE 1 TABLET(40 MG) BY MOUTH DAILY   STUDY - REPRIEVE [id number] - PITAVASTATIN 4 MG OR PLACEBO TABLET    Take 1 tablet (4 mg total) by mouth daily.   STUDY - REPRIEVE [phone] - PITAVASTATIN 4 MG OR PLACEBO TABLET (PI-VAN DAM)    Take one tablet by mouth once daily.  [id number] E ACTG [id number]  Modified Medications   No medications on file  Discontinued Medications   No medications on file    Subjective: Delmer is in for his routine HIV follow-up visit.  He has been back on his Descovy, Prezcobix and dapsone since his last visit.  He thinks he may have missed 2 doses before he started using his pillbox again.  He is not having any problems tolerating his medications.  He has not used any recreational drugs or alcohol since his last visit.  His  depression and anxiety are much improved.  He is getting along with his sister, [name], better now.  Review of Systems: Review of Systems  Constitutional:  Negative for fever and weight loss.  Respiratory:  Negative for cough.   Cardiovascular:  Negative for chest pain.  Psychiatric/Behavioral:  Negative for depression. The patient is nervous/anxious.    Past Medical History:  Diagnosis Date   Anxiety    Cellulitis 02/11/2014   LEFT ARM   Depression    Headache(784.0)    HIV (human immunodeficiency virus infection) (HCC)     Social History   Tobacco Use   Smoking status: Former    Packs/day: 0.10    Years: 30.00    Pack years: 3.00    Types: Cigarettes    Quit date: 11/19/2014    Years since quitting: 6.1   Smokeless tobacco: Never  Vaping Use   Vaping Use: Never used  Substance Use Topics   Alcohol use: No    Alcohol/week: 0.0 standard drinks   Drug use: Yes    Comment: "pretty much all of them, no IVs"    Family History  Adopted: Yes    Allergies  Allergen Reactions   Sulfonamide Derivatives Itching    Health Maintenance  Topic Date Due   TETANUS/TDAP  Never done   Zoster Vaccines- Shingrix (1 of 2) Never done   COLONOSCOPY (Pts 45-3yrs Insurance coverage will need to be confirmed)  Never done   COVID-19 Vaccine (4 - Booster) 07/25/2020   INFLUENZA VACCINE  01/18/2021   Pneumococcal Vaccine 71-18 Years old (4 - PPSV23 or PCV20) 07/14/2024   Hepatitis C Screening  Completed   HIV Screening  Completed   HPV VACCINES  Aged Out    Objective:  Vitals:   01/07/21 0844  BP: (!) 153/87  Pulse: 83  Temp: 98.2 F (36.8 C)  TempSrc: Oral  Weight: 164 lb (74.4 kg)   Body mass index is 24.22 kg/m.  Physical Exam Constitutional:      Comments: His spirits are good.  Cardiovascular:     Rate and Rhythm: Normal rate.  Pulmonary:     Effort: Pulmonary effort is normal.  Psychiatric:        Mood and Affect: Mood normal.    Lab Results Lab Results   Component Value Date   WBC 6.0 11/25/2020   HGB 14.0 11/25/2020   HCT 41.2 11/25/2020   MCV 88.0 11/25/2020   PLT 166 11/25/2020    Lab Results  Component Value Date   CREATININE 1.03 11/25/2020   BUN 22 11/25/2020   NA 136 11/25/2020   K 5.2 11/25/2020   CL 104 11/25/2020   CO2 27 11/25/2020    Lab Results  Component Value Date   ALT 38 11/25/2020   AST 33 11/25/2020   ALKPHOS 114 07/02/2018   BILITOT 0.4 11/25/2020    Lab Results  Component Value Date   CHOL 167 10/28/2019   HDL 33 (L) 10/28/2019   LDLCALC 108 (H) 10/28/2019   TRIG 146 10/28/2019   CHOLHDL 5.1 (H) 10/28/2019   Lab Results  Component Value Date   LABRPR REACTIVE (A) 11/25/2020   RPRTITER 1:2 (H) 11/25/2020   HIV 1 RNA Quant (Copies/mL)  Date Value  11/25/2020 102,000 (H)  07/30/2020 57 (H)  03/09/2020 638 (H)   CD4 (/uL)  Date Value  08/01/2018 201 (L)   CD4 T Cell Abs (/uL)  Date Value  11/25/2020 80 (L)  07/30/2020 123 (L)  03/09/2020 182 (L)     Problem List Items Addressed This Visit       Medium   Human immunodeficiency virus (HIV) disease (HCC)    He has a very long history of cycling on and off his antiretroviral meds occasions when he starts using drugs and alcohol again.  Over time this is caused a progressive decline in his CD4 count.  His adherence is better since becoming sober again recently.  I will repeat blood work today.  He will continue Descovy, Prezcobix and dapsone and follow-up in 3 months.       Relevant Orders   HIV-1 RNA quant-no reflex-bld   Polysubstance abuse (HCC)    He is doing much better coincident with stopping drugs and alcohol recently.       Depression    His depression is in remission and his anxiety has improved.  He can see a direct correlation between his drug use and his mood.         Unprioritized   Secondary syphilis   Relevant Orders   T-helper cell (CD4)- ([facility] clinic only)   RPR      [name], MD [facility]  for Infectious Disease [facility] [phone] pager  [phone] cell 01/07/2021, 9:01 AM

## 2021-01-08 LAB — T-HELPER CELL (CD4) - (RCID CLINIC ONLY)
CD4 % Helper T Cell: 10 % — ABNORMAL LOW (ref 33–65)
CD4 T Cell Abs: 157 /uL — ABNORMAL LOW (ref 400–1790)

## 2021-01-11 LAB — HIV-1 RNA QUANT-NO REFLEX-BLD
HIV 1 RNA Quant: 1160 Copies/mL — ABNORMAL HIGH
HIV-1 RNA Quant, Log: 3.06 Log cps/mL — ABNORMAL HIGH

## 2021-01-11 LAB — RPR: RPR Ser Ql: REACTIVE — AB

## 2021-01-11 LAB — RPR TITER: RPR Titer: 1:1 {titer} — ABNORMAL HIGH

## 2021-01-11 LAB — FLUORESCENT TREPONEMAL AB(FTA)-IGG-BLD: Fluorescent Treponemal ABS: REACTIVE — AB

## 2021-01-20 ENCOUNTER — Telehealth: Payer: Self-pay

## 2021-01-20 ENCOUNTER — Other Ambulatory Visit: Payer: Self-pay

## 2021-01-20 DIAGNOSIS — F411 Generalized anxiety disorder: Secondary | ICD-10-CM

## 2021-01-20 MED ORDER — LORAZEPAM 2 MG PO TABS: 2.0000 mg | ORAL_TABLET | Freq: Two times a day (BID) | ORAL | 5 refills | Status: DC | PRN

## 2021-01-20 NOTE — Telephone Encounter (Signed)
Patient called office today requesting refills on his Lorazepam. States pharmacy does not have any refills on file. Spoke with MD who is okay with refilling medication.  Juanita Laster, RMA

## 2021-01-21 ENCOUNTER — Other Ambulatory Visit: Payer: Self-pay

## 2021-01-21 DIAGNOSIS — F411 Generalized anxiety disorder: Secondary | ICD-10-CM

## 2021-01-21 MED ORDER — LORAZEPAM 2 MG PO TABS: 2.0000 mg | ORAL_TABLET | Freq: Two times a day (BID) | ORAL | 0 refills | Status: DC | PRN

## 2021-01-27 ENCOUNTER — Other Ambulatory Visit: Payer: Self-pay | Admitting: Internal Medicine

## 2021-01-27 DIAGNOSIS — F4310 Post-traumatic stress disorder, unspecified: Secondary | ICD-10-CM

## 2021-02-26 ENCOUNTER — Encounter: Payer: Medicare Other | Admitting: *Deleted

## 2021-04-13 ENCOUNTER — Other Ambulatory Visit: Payer: Self-pay | Admitting: Internal Medicine

## 2021-04-13 ENCOUNTER — Telehealth: Payer: Self-pay

## 2021-04-13 NOTE — Telephone Encounter (Signed)
Just a FYI - Patient called in requesting a refill on Lorazepam. Patient has not had medication prescribed since 01/21/2021. Patient is scheduled to see Dr.Campbell tomorrow and informed patient he could discuss refill with him then. Patient stated that he would come in acting crazier than he really is. I stated I'm sorry to the patient and he said he would see [place] tomorrow.      Creola Krotz Lesli Albee, CMA

## 2021-04-14 ENCOUNTER — Ambulatory Visit: Payer: Medicare Other | Admitting: Internal Medicine

## 2021-04-14 ENCOUNTER — Other Ambulatory Visit: Payer: Self-pay | Admitting: Internal Medicine

## 2021-04-14 DIAGNOSIS — F411 Generalized anxiety disorder: Secondary | ICD-10-CM

## 2021-04-14 DIAGNOSIS — F4310 Post-traumatic stress disorder, unspecified: Secondary | ICD-10-CM

## 2021-04-14 MED ORDER — PAROXETINE HCL 40 MG PO TABS: ORAL_TABLET | ORAL | 11 refills | Status: DC

## 2021-04-14 MED ORDER — LORAZEPAM 2 MG PO TABS: 2.0000 mg | ORAL_TABLET | Freq: Two times a day (BID) | ORAL | 0 refills | Status: DC | PRN

## 2021-05-27 ENCOUNTER — Telehealth: Payer: Self-pay

## 2021-05-27 ENCOUNTER — Ambulatory Visit: Payer: Medicare Other | Admitting: Internal Medicine

## 2021-05-27 ENCOUNTER — Encounter: Payer: Medicare Other | Admitting: *Deleted

## 2021-05-27 NOTE — Telephone Encounter (Signed)
Called patient to see if he would be able to make it so his appointment today, no answer and unable to leave voicemail.   Sandie Ano, RN

## 2021-08-15 ENCOUNTER — Emergency Department (HOSPITAL_BASED_OUTPATIENT_CLINIC_OR_DEPARTMENT_OTHER): Payer: Medicare Other

## 2021-08-15 ENCOUNTER — Encounter (HOSPITAL_BASED_OUTPATIENT_CLINIC_OR_DEPARTMENT_OTHER): Payer: Self-pay | Admitting: Emergency Medicine

## 2021-08-15 ENCOUNTER — Emergency Department (HOSPITAL_BASED_OUTPATIENT_CLINIC_OR_DEPARTMENT_OTHER)
Admission: EM | Admit: 2021-08-15 | Discharge: 2021-08-15 | Disposition: A | Discharge status: Home / Self Care | Payer: Medicare Other | Attending: Emergency Medicine | Admitting: Emergency Medicine

## 2021-08-15 ENCOUNTER — Other Ambulatory Visit: Payer: Self-pay

## 2021-08-15 DIAGNOSIS — S61431A Puncture wound without foreign body of right hand, initial encounter: Secondary | ICD-10-CM | POA: Insufficient documentation

## 2021-08-15 DIAGNOSIS — S81859A Open bite, unspecified lower leg, initial encounter: Secondary | ICD-10-CM

## 2021-08-15 DIAGNOSIS — S61432A Puncture wound without foreign body of left hand, initial encounter: Secondary | ICD-10-CM | POA: Insufficient documentation

## 2021-08-15 DIAGNOSIS — Z23 Encounter for immunization: Secondary | ICD-10-CM | POA: Diagnosis not present

## 2021-08-15 DIAGNOSIS — S61214A Laceration without foreign body of right ring finger without damage to nail, initial encounter: Secondary | ICD-10-CM | POA: Insufficient documentation

## 2021-08-15 DIAGNOSIS — W540XXA Bitten by dog, initial encounter: Secondary | ICD-10-CM | POA: Diagnosis not present

## 2021-08-15 DIAGNOSIS — S6991XA Unspecified injury of right wrist, hand and finger(s), initial encounter: Secondary | ICD-10-CM | POA: Diagnosis present

## 2021-08-15 DIAGNOSIS — S71131A Puncture wound without foreign body, right thigh, initial encounter: Secondary | ICD-10-CM | POA: Diagnosis not present

## 2021-08-15 DIAGNOSIS — S61451A Open bite of right hand, initial encounter: Secondary | ICD-10-CM

## 2021-08-15 MED ORDER — TETANUS-DIPHTH-ACELL PERTUSSIS 5-2.5-18.5 LF-MCG/0.5 IM SUSY
0.5000 mL | PREFILLED_SYRINGE | Freq: Once | INTRAMUSCULAR | Status: AC
  Administered 2021-08-15: 0.5 mL via INTRAMUSCULAR
  Filled 2021-08-15: qty 0.5

## 2021-08-15 MED ORDER — AMOXICILLIN-POT CLAVULANATE 875-125 MG PO TABS: 1.0000 | ORAL_TABLET | Freq: Two times a day (BID) | ORAL | 0 refills | Status: DC

## 2021-08-15 MED ORDER — AMOXICILLIN-POT CLAVULANATE 875-125 MG PO TABS
1.0000 | ORAL_TABLET | Freq: Once | ORAL | Status: AC
  Administered 2021-08-15: 1 via ORAL
  Filled 2021-08-15: qty 1

## 2021-08-15 NOTE — ED Provider Notes (Signed)
[facility] EMERGENCY DEPARTMENT Provider Note   CSN: [id number] Arrival date & time: 08/15/21  1343     History  Chief Complaint  Patient presents with   Animal Bite    Jordan Calderon is a 61 y.o. male.  The history is provided by the patient.  Animal Bite Contact animal:  Dog Location:  Hand and leg Hand injury location:  L hand and R hand Leg injury location:  R upper leg Time since incident: Yesterday. Pain details:    Quality:  Aching, burning and localized   Severity:  Moderate   Timing:  Constant   Progression:  Unchanged Incident location:  Home Provoked: unprovoked   Notifications:  None Animal's rabies vaccination status:  Out of date (Has received vaccines in the past and is his dog and it has a tendency to bite) Animal in possession: yes   Tetanus status:  Out of date Relieved by:  None tried Worsened by:  Activity Ineffective treatments:  None tried Associated symptoms: swelling   Associated symptoms comment:  Pain     Home Medications Prior to Admission medications   Medication Sig Start Date End Date Taking? Authorizing Provider  amoxicillin-clavulanate (AUGMENTIN) 875-125 MG tablet Take 1 tablet by mouth every 12 (twelve) hours. 08/15/21  Yes Gwyneth Sprout, MD  dapsone 100 MG tablet Take 1 tablet (100 mg total) by mouth daily. 11/25/20   [name], MD  darunavir-cobicistat (PREZCOBIX) 800-150 MG tablet TAKE 1 TABLET BY MOUTH DAILY. SWALLOW WHOLE. DO NOT CRUSH, BREAK OR CHEW TABLETS.TK WITH FOOD 11/25/20   [name], MD  emtricitabine-tenofovir AF (DESCOVY) 200-25 MG tablet Take 1 tablet by mouth daily. 11/25/20   [name], MD  ibuprofen (ADVIL,MOTRIN) 200 MG tablet Take 800 mg by mouth every 6 (six) hours as needed (pain).    [provider]  LORazepam (ATIVAN) 2 MG tablet Take 1 tablet (2 mg total) by mouth 2 (two) times daily as needed. for anxiety 04/14/21   [name], MD  PARoxetine (PAXIL) 40 MG tablet TAKE  1 TABLET(40 MG) BY MOUTH DAILY 04/14/21   [name], MD  Study - REPRIEVE [phone] - pitavastatin 4 mg or placebo tablet [facility]) Take one tablet by mouth once daily.  [id number] ACTG [id number] 10/09/20   Horton Finer, PA-C  Study - REPRIEVE [phone] - pitavastatin 4 mg or placebo tablet Take 1 tablet (4 mg total) by mouth daily. 07/11/18   Randall Hiss, MD      Allergies    Sulfonamide derivatives    Review of Systems   Review of Systems  Physical Exam Updated Vital Signs BP 98/83 (BP Location: Left Arm)    Pulse (!) 102    Temp 99 F (37.2 C) (Oral)    Resp 18    Ht 5\' 9"  (1.753 m)    Wt 70.3 kg    SpO2 100%    BMI 22.89 kg/m  Physical Exam Vitals and nursing note reviewed.  Constitutional:      General: He is not in acute distress.    Appearance: He is well-developed.     Comments: Tearful throughout exam  HENT:     Head: Normocephalic and atraumatic.  Eyes:     Conjunctiva/sclera: Conjunctivae normal.     Pupils: Pupils are equal, round, and reactive to light.  Cardiovascular:     Rate and Rhythm: Normal rate and regular rhythm.     Heart sounds: No murmur heard. Pulmonary:  Effort: Pulmonary effort is normal. No respiratory distress.     Breath sounds: Normal breath sounds. No wheezing or rales.  Abdominal:     General: There is no distension.     Palpations: Abdomen is soft.     Tenderness: There is no abdominal tenderness. There is no guarding or rebound.  Musculoskeletal:        General: Swelling and tenderness present. Normal range of motion.       Hands:     Cervical back: Normal range of motion and neck supple.       Legs:  Skin:    General: Skin is warm and dry.     Findings: No erythema or rash.  Neurological:     Mental Status: He is alert and oriented to person, place, and time.  Psychiatric:        Behavior: Behavior normal.    ED Results / Procedures / Treatments   Labs (all labs ordered are listed, but only abnormal  results are displayed) Labs Reviewed - No data to display  EKG None  Radiology DG Hand Complete Left  Result Date: 08/15/2021 CLINICAL DATA:  Dog bite EXAM: LEFT HAND - COMPLETE 3+ VIEW COMPARISON:  None. FINDINGS: No fracture or dislocation is seen. There are no radiopaque foreign bodies. IMPRESSION: No radiographic abnormality is seen in the left hand. Electronically Signed   By: Ernie Avena M.D.   On: 08/15/2021 15:04   DG Hand Complete Right  Result Date: 08/15/2021 CLINICAL DATA:  Dog bite EXAM: RIGHT HAND - COMPLETE 3+ VIEW COMPARISON:  None. FINDINGS: No fracture or dislocation is seen. There are no radiopaque foreign bodies. Soft tissue swelling is noted in the dorsal aspect of metacarpophalangeal joints. Possible soft tissue swelling is seen in the ring finger. IMPRESSION: No fracture or dislocation is seen in the right hand. There are no radiopaque foreign bodies. Electronically Signed   By: Ernie Avena M.D.   On: 08/15/2021 15:03    Procedures Procedures    Medications Ordered in ED Medications  amoxicillin-clavulanate (AUGMENTIN) 875-125 MG per tablet 1 tablet (1 tablet Oral Given 08/15/21 1444)  Tdap (BOOSTRIX) injection 0.5 mL (0.5 mLs Intramuscular Given 08/15/21 1445)    ED Course/ Medical Decision Making/ A&P                           Medical Decision Making Amount and/or Complexity of Data Reviewed Radiology: ordered and independent interpretation performed. Decision-making details documented in ED Course.  Risk Prescription drug management.   Patient presenting today after being bitten last night by his dog.  The dog has had rabies vaccines in the past but has not not been updated.  The dog is in his care and is acting completely normal.  Low suspicion for rabies at this time.  The dog is known to bite and has been before.  He has multiple bite wounds and puncture wounds over bilateral hands and the right posterior thigh.  There is some erythema  and swelling.  Concern for early infection.  Patient's tetanus shot was updated.  He was given a dose of Augmentin and will need a prescription.  Plain films are pending.  3:09 PM I independently viewed and interpreted pt's x-rays and hand are neg for acute findings.  This was discussed with the pt.  He was given ppx for augmentin and return precautions.        Final Clinical Impression(s) / ED Diagnoses  Final diagnoses:  Dog bite of right hand, initial encounter  Open wound of left hand due to dog bite  Dog bite of lower extremity    Rx / DC Orders ED Discharge Orders          Ordered    amoxicillin-clavulanate (AUGMENTIN) 875-125 MG tablet  Every 12 hours        08/15/21 1457              Gwyneth Sprout, MD 08/15/21 [phone]

## 2021-08-15 NOTE — ED Notes (Signed)
Will dc once wounds are able to be cleansed, both hands soaking now.

## 2021-08-15 NOTE — ED Notes (Signed)
X-ray at bedside

## 2021-08-15 NOTE — Discharge Instructions (Signed)
Remove bandages tomorrow and make sure you are cleaning daily.  Take an additional dose of antibiotic tonight and then 2 times a day until gone.  Return to the ER if the areas become more red and swollen and looks like they are getting infected.

## 2021-08-15 NOTE — ED Triage Notes (Incomplete)
Pt arrives pov with c/o animal bite by pts dog to right thigh and bilaterally on hands. Unknown when last rabies

## 2021-09-02 ENCOUNTER — Telehealth: Payer: Self-pay

## 2021-09-02 NOTE — Telephone Encounter (Signed)
Called patient to schedule overdue follow up appointment, no answer. Left HIPAA compliant voicemail requesting callback.    Tyrick Dunagan D Yuya Vanwingerden, RN  

## 2022-04-23 ENCOUNTER — Emergency Department (HOSPITAL_BASED_OUTPATIENT_CLINIC_OR_DEPARTMENT_OTHER)
Admission: EM | Admit: 2022-04-23 | Discharge: 2022-04-23 | Disposition: A | Discharge status: Home / Self Care | Payer: Medicare Other | Attending: Emergency Medicine | Admitting: Emergency Medicine

## 2022-04-23 ENCOUNTER — Other Ambulatory Visit: Payer: Self-pay

## 2022-04-23 ENCOUNTER — Encounter (HOSPITAL_BASED_OUTPATIENT_CLINIC_OR_DEPARTMENT_OTHER): Payer: Self-pay | Admitting: Emergency Medicine

## 2022-04-23 DIAGNOSIS — L02212 Cutaneous abscess of back [any part, except buttock]: Secondary | ICD-10-CM | POA: Diagnosis present

## 2022-04-23 DIAGNOSIS — Z21 Asymptomatic human immunodeficiency virus [HIV] infection status: Secondary | ICD-10-CM | POA: Diagnosis not present

## 2022-04-23 DIAGNOSIS — L03312 Cellulitis of back [any part except buttock]: Secondary | ICD-10-CM | POA: Insufficient documentation

## 2022-04-23 MED ORDER — DOXYCYCLINE HYCLATE 100 MG PO CAPS: 100.0000 mg | ORAL_CAPSULE | Freq: Two times a day (BID) | ORAL | 0 refills | Status: DC

## 2022-04-23 NOTE — ED Notes (Signed)
Rt shoulder blade red in color has open wound

## 2022-04-23 NOTE — ED Triage Notes (Signed)
States has had "ingrown hair" to back x 3 weeks and it has gotten worse. Large circular area to right scapula. Red with dark center

## 2022-04-23 NOTE — Discharge Instructions (Signed)
Return to the ED with any new or worsening signs or symptoms Please follow-up with infectious disease doctor Please begin taking doxycycline twice daily for the next 10 days

## 2022-04-23 NOTE — ED Provider Notes (Signed)
[facility] EMERGENCY DEPARTMENT Provider Note   CSN: [id number] Arrival date & time: 04/23/22  1024     History  Chief Complaint  Patient presents with   Abscess    Jordan Calderon is a 61 y.o. male with medical history of HIV.  Patient reports to ED for evaluation of possible ingrown hair/abscess to back.  Patient reports that beginning 3 weeks ago he had a small circular area on the right side of his back near his scapula.  The patient reports that this area in question has progressively worsened over the last 3 weeks, got more red and swollen.  The patient reports that initially he thought it was an ingrown hair and he was treating it at home with warm soaks which did seem to relieve the pain.  Patient reports that warm showers are no longer relieving pain.  Patient reports that there is drainage from it with yellow color in appearance.  The patient denies any fevers, nausea, vomiting, body aches or chills.  Patient unsure of last CD4 count.   Abscess Associated symptoms: no fever, no nausea and no vomiting        Home Medications Prior to Admission medications   Medication Sig Start Date End Date Taking? Authorizing Provider  darunavir-cobicistat (PREZCOBIX) 800-150 MG tablet TAKE 1 TABLET BY MOUTH DAILY. SWALLOW WHOLE. DO NOT CRUSH, BREAK OR CHEW TABLETS.TK WITH FOOD 11/25/20  Yes Michel Bickers, MD  doxycycline (VIBRAMYCIN) 100 MG capsule Take 1 capsule (100 mg total) by mouth 2 (two) times daily. 04/23/22  Yes Azucena Cecil, PA-C  emtricitabine-tenofovir AF (DESCOVY) 200-25 MG tablet Take 1 tablet by mouth daily. 11/25/20  Yes Michel Bickers, MD  PARoxetine (PAXIL) 40 MG tablet TAKE 1 TABLET(40 MG) BY MOUTH DAILY 04/14/21  Yes Michel Bickers, MD  amoxicillin-clavulanate (AUGMENTIN) 875-125 MG tablet Take 1 tablet by mouth every 12 (twelve) hours. 08/15/21   Blanchie Dessert, MD  dapsone 100 MG tablet Take 1 tablet (100 mg total) by mouth daily. 11/25/20   Michel Bickers, MD  ibuprofen (ADVIL,MOTRIN) 200 MG tablet Take 800 mg by mouth every 6 (six) hours as needed (pain).    [provider]  LORazepam (ATIVAN) 2 MG tablet Take 1 tablet (2 mg total) by mouth 2 (two) times daily as needed. for anxiety 04/14/21   Michel Bickers, MD  Study - REPRIEVE [phone] - pitavastatin 4 mg or placebo tablet [facility]) Take one tablet by mouth once daily.  [id number] E ACTG [id number] 10/09/20   Robert Bellow, PA-C  Study - REPRIEVE [phone] - pitavastatin 4 mg or placebo tablet Take 1 tablet (4 mg total) by mouth daily. 07/11/18   Truman Hayward, MD      Allergies    Sulfonamide derivatives    Review of Systems   Review of Systems  Constitutional:  Negative for chills and fever.  Gastrointestinal:  Negative for nausea and vomiting.  Musculoskeletal:  Negative for myalgias.  Skin:  Positive for wound.  All other systems reviewed and are negative.   Physical Exam Updated Vital Signs BP 128/87 (BP Location: Right Arm)   Pulse 72   Temp 98.2 F (36.8 C) (Oral)   Resp 18   Ht 5\' 9"  (1.753 m)   Wt 70.3 kg   SpO2 98%   BMI 22.89 kg/m  Physical Exam Vitals and nursing note reviewed.  Constitutional:      General: He is not in acute distress.    Appearance:  He is well-developed.  HENT:     Head: Normocephalic and atraumatic.     Nose: Nose normal. No congestion.     Mouth/Throat:     Mouth: Mucous membranes are moist.     Pharynx: Oropharynx is clear.  Eyes:     Conjunctiva/sclera: Conjunctivae normal.  Cardiovascular:     Rate and Rhythm: Normal rate and regular rhythm.     Heart sounds: No murmur heard. Pulmonary:     Effort: Pulmonary effort is normal. No respiratory distress.     Breath sounds: Normal breath sounds.  Abdominal:     Palpations: Abdomen is soft.     Tenderness: There is no abdominal tenderness.  Musculoskeletal:        General: No swelling.     Cervical back: Neck supple.  Skin:    General: Skin is warm and  dry.     Capillary Refill: Capillary refill takes less than 2 seconds.     Comments: Area of erythema inferior to patient right clavicle.  There is induration however no fluctuance.  There is no appreciable drainage.  Ultrasound imaging shows no fluid collection.  Neurological:     Mental Status: He is alert and oriented to person, place, and time.  Psychiatric:        Mood and Affect: Mood normal.     ED Results / Procedures / Treatments   Labs (all labs ordered are listed, but only abnormal results are displayed) Labs Reviewed  HIV-1 RNA QUANT-NO REFLEX-BLD  T-HELPER CELLS (CD4) COUNT (NOT AT [facility])    EKG None  Radiology No results found.  Procedures Procedures   Medications Ordered in ED Medications - No data to display  ED Course/ Medical Decision Making/ A&P                           Medical Decision Making Amount and/or Complexity of Data Reviewed Labs: ordered.   61 year old male presents to the ED for evaluation.  Please see HPI for further details.  On my examination the patient is afebrile and nontachycardic.  Patient lung sounds clear bilaterally, not hypoxic.  Patient abdomen soft and compressible throughout.  Patient does have an area of the erythema just inferior to the patient right clavicle.  There is no obvious fluctuance or abscess that I am able to incise and drain here in the department.  There is induration.  Ultrasound imaging was utilized, no appreciable fluid collection was noted by either myself or my attending.  Patient states he is unsure of his last T4 count.  We will collect CD4 count here.  We will provide the patient with prescription of doxycycline for 10 days and advised to follow-up with his infectious disease clinic for further management.  Return precautions provided, patient voiced understanding.  Patient had all his questions answered to satisfaction.  The patient stable at this time for discharge home.  Final Clinical  Impression(s) / ED Diagnoses Final diagnoses:  Cellulitis of back except buttock    Rx / DC Orders ED Discharge Orders          Ordered    doxycycline (VIBRAMYCIN) 100 MG capsule  2 times daily        04/23/22 1132              Al Decant, PA-C 04/23/22 1133    Benjiman Core, MD 04/24/22 [phone]

## 2022-04-24 LAB — HIV-1 RNA QUANT-NO REFLEX-BLD
HIV 1 RNA Quant: 1050000 copies/mL
LOG10 HIV-1 RNA: 6.021 log10copy/mL

## 2022-04-28 ENCOUNTER — Ambulatory Visit (INDEPENDENT_AMBULATORY_CARE_PROVIDER_SITE_OTHER): Payer: Medicare Other | Admitting: Internal Medicine

## 2022-04-28 ENCOUNTER — Other Ambulatory Visit: Payer: Self-pay

## 2022-04-28 ENCOUNTER — Encounter: Payer: Self-pay | Admitting: Internal Medicine

## 2022-04-28 ENCOUNTER — Ambulatory Visit (INDEPENDENT_AMBULATORY_CARE_PROVIDER_SITE_OTHER): Payer: Medicare Other

## 2022-04-28 VITALS — BP 128/78 | HR 101 | Temp 98.5°F | Ht 69.0 in | Wt 164.0 lb

## 2022-04-28 DIAGNOSIS — Z23 Encounter for immunization: Secondary | ICD-10-CM

## 2022-04-28 DIAGNOSIS — L089 Local infection of the skin and subcutaneous tissue, unspecified: Secondary | ICD-10-CM

## 2022-04-28 DIAGNOSIS — B20 Human immunodeficiency virus [HIV] disease: Secondary | ICD-10-CM

## 2022-04-28 DIAGNOSIS — F331 Major depressive disorder, recurrent, moderate: Secondary | ICD-10-CM | POA: Diagnosis not present

## 2022-04-28 MED ORDER — DAPSONE 100 MG PO TABS
100.0000 mg | ORAL_TABLET | Freq: Every day | ORAL | 11 refills | Status: DC
Start: 2022-04-28 — End: 2023-01-17

## 2022-04-28 NOTE — Assessment & Plan Note (Signed)
Jordan Calderon's adherence tends to wax and wane as it has over the past several decades.  He became very shaken when he learned that his viral load was over 1 million.  He said that he has all the tools necessary to take his medication he just needs to recommit to it.  I suggested that he use his pillbox and take his medications each morning.  I also suggested that he set a cell phone alarm to remind him.  He will get blood work today to check his CD4 count and resistance assays.  He will follow-up in 6 weeks.

## 2022-04-28 NOTE — Progress Notes (Signed)
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  Previous Medications   DARUNAVIR-COBICISTAT (PREZCOBIX) 800-150 MG TABLET    TAKE 1 TABLET BY MOUTH DAILY. SWALLOW WHOLE. DO NOT CRUSH, BREAK OR CHEW TABLETS.TK WITH FOOD   DOXYCYCLINE (VIBRAMYCIN) 100 MG CAPSULE    Take 1 capsule (100 mg total) by mouth 2 (two) times daily.   EMTRICITABINE-TENOFOVIR AF (DESCOVY) 200-25 MG TABLET    Take 1 tablet by mouth daily.   IBUPROFEN (ADVIL,MOTRIN) 200 MG TABLET    Take 800 mg by mouth every 6 (six) hours as needed (pain).   PAROXETINE (PAXIL) 40 MG TABLET    TAKE 1 TABLET(40 MG) BY MOUTH DAILY   STUDY - REPRIEVE [id number] - PITAVASTATIN 4 MG OR PLACEBO TABLET    Take 1 tablet (4 mg total) by mouth daily.   STUDY - REPRIEVE [phone] - PITAVASTATIN 4 MG OR PLACEBO TABLET (PI-VAN DAM)    Take one tablet by mouth once daily.  [id number] E ACTG A5332  Modified Medications   No medications on file  Discontinued Medications   AMOXICILLIN-CLAVULANATE (AUGMENTIN) 875-125 MG TABLET    Take 1 tablet by mouth every 12 (twelve) hours.   DAPSONE 100 MG TABLET    Take 1 tablet (100 mg total) by mouth daily.   LORAZEPAM (ATIVAN) 2 MG TABLET    Take 1 tablet (2 mg total) by mouth 2 (two) times daily as needed. for anxiety    Subjective: Jordan Calderon is in for his emergency department follow-up visit.  This is his first  visit since July 2022.  Initially he told me that he had not had any problems obtaining, taking or tolerating his Descovy and Prezcobix but when shown his viral load he came clean that he has not been taking his medication for a long time.  He says that he does not really understand why he does not take it consistently but says it is harder for him when he feels well.  He denies any problems tolerating his medications.  He was seen in the emergency department last week was found to have a red indurated area on his right upper back.  He was treated with empiric doxycycline.  He had spontaneous drainage from that area on 1 occasion and is feeling better.  He has taken about half of the doxycycline.  He tells me his baseline anxiety and depression have been doing better until he learned about his recent HIV viral load.  He denies any recent drug use.  Review of Systems: Review of Systems  Constitutional:  Negative for fever and weight loss.  Respiratory:  Negative for cough.   Cardiovascular:  Negative for chest pain.  Gastrointestinal:  Negative for abdominal pain, diarrhea, nausea and vomiting.  Psychiatric/Behavioral:  Positive for depression.  Negative for substance abuse. The patient is nervous/anxious.     Past Medical History:  Diagnosis Date   Anxiety    Cellulitis 02/11/2014   LEFT ARM   Depression    Headache(784.0)    HIV (human immunodeficiency virus infection) (HCC)     Social History   Tobacco Use   Smoking status: Former    Packs/day: 0.10    Years: 30.00    Total pack years: 3.00    Types: Cigarettes    Quit date: 11/19/2014    Years since quitting: 7.4   Smokeless tobacco: Never  Vaping Use   Vaping Use: Never used  Substance Use Topics   Alcohol use: No    Alcohol/week: 0.0 standard drinks of alcohol   Drug use: Not Currently    Family History  Adopted: Yes    Allergies  Allergen Reactions   Sulfonamide Derivatives Itching    Health Maintenance  Topic Date  Due   Medicare Annual Wellness (AWV)  Never done   Zoster Vaccines- Shingrix (1 of 2) Never done   COLONOSCOPY (Pts 45-29yrs Insurance coverage will need to be confirmed)  Never done   COVID-19 Vaccine (4 - Pfizer risk series) 06/19/2020   INFLUENZA VACCINE  01/18/2022   TETANUS/TDAP  08/16/2031   Hepatitis C Screening  Completed   HIV Screening  Completed   HPV VACCINES  Aged Out    Objective:  Vitals:   04/28/22 0931  BP: 128/78  Pulse: (!) 101  Temp: 98.5 F (36.9 C)  TempSrc: Oral  Weight: 164 lb (74.4 kg)  Height: 5\' 9"  (1.753 m)   Body mass index is 24.22 kg/m.  Physical Exam Constitutional:      Comments: He is anxious and disheveled, even more so than normal.  HENT:     Mouth/Throat:     Mouth: Mucous membranes are moist.     Pharynx: Oropharynx is clear. No oropharyngeal exudate or posterior oropharyngeal erythema.     Comments: He has lost a lower front tooth. Cardiovascular:     Rate and Rhythm: Normal rate and regular rhythm.     Heart sounds: No murmur heard. Pulmonary:     Effort: Pulmonary effort is normal.     Breath sounds: Normal breath sounds.  Skin:    Comments: He has increased seborrhea on his face.     Lab Results Lab Results  Component Value Date   WBC 6.0 11/25/2020   HGB 14.0 11/25/2020   HCT 41.2 11/25/2020   MCV 88.0 11/25/2020   PLT 166 11/25/2020    Lab Results  Component Value Date   CREATININE 1.03 11/25/2020   BUN 22 11/25/2020   NA 136 11/25/2020   K 5.2 11/25/2020   CL 104 11/25/2020   CO2 27 11/25/2020    Lab Results  Component Value Date   ALT 38 11/25/2020   AST 33 11/25/2020   ALKPHOS 114 07/02/2018   BILITOT 0.4 11/25/2020    Lab Results  Component Value Date   CHOL 167 10/28/2019   HDL 33 (L) 10/28/2019   LDLCALC 108 (H) 10/28/2019   TRIG 146 10/28/2019   CHOLHDL 5.1 (H) 10/28/2019   Lab Results  Component Value Date   LABRPR REACTIVE (A) 01/07/2021   RPRTITER 1:1 (H) 01/07/2021   HIV 1 RNA  Quant  Date Value  04/23/2022 1,050,000 copies/mL  01/07/2021 1,160 Copies/mL (H)  11/25/2020 102,000 Copies/mL (H)   CD4 (/uL)  Date Value  08/01/2018 201 (L)  CD4 T Cell Abs (/uL)  Date Value  01/07/2021 157 (L)  11/25/2020 80 (L)  07/30/2020 123 (L)     Problem List Items Addressed This Visit       Medium    Human immunodeficiency virus (HIV) disease (HCC) - Primary    Jordan Calderon adherence tends to wax and wane as it has over the past several decades.  He became very shaken when he learned that his viral load was over 1 million.  He said that he has all the tools necessary to take his medication he just needs to recommit to it.  I suggested that he use his pillbox and take his medications each morning.  I also suggested that he set a cell phone alarm to remind him.  He will get blood work today to check his CD4 count and resistance assays.  He will follow-up in 6 weeks.      Relevant Medications   dapsone 100 MG tablet   Other Relevant Orders   T-helper cells (CD4) count (not at [facility])   HIV-1 genotypr plus   HIV-1 Integrase Genotype   Depression    His chronic depression and anxiety is a factor in his difficulty adhering to clinic visits and medication.        Unprioritized   Soft tissue infection    He had developed a large abscess on his back that drained spontaneously and is improving slowly on empiric doxycycline.  I will have him complete 5 more days of doxycycline therapy.      Relevant Medications   dapsone 100 MG tablet      [name], MD [facility] [phone] pager   [phone] cell 04/28/2022, 10:00 AM

## 2022-04-28 NOTE — Addendum Note (Signed)
Addended by: Harley Alto on: 04/28/2022 11:36 AM   Modules accepted: Orders

## 2022-04-28 NOTE — Assessment & Plan Note (Signed)
He had developed a large abscess on his back that drained spontaneously and is improving slowly on empiric doxycycline.  I will have him complete 5 more days of doxycycline therapy.

## 2022-04-28 NOTE — Addendum Note (Signed)
Addended by: Harley Alto on: 04/28/2022 10:04 AM   Modules accepted: Orders

## 2022-04-28 NOTE — Assessment & Plan Note (Signed)
His chronic depression and anxiety is a factor in his difficulty adhering to clinic visits and medication.

## 2022-05-06 LAB — HIV-1 GENOTYPING (RTI,PI,IN INHBTR)
Date Viral Load Collected: 11092023
HIV-1 Genotype: DETECTED — AB

## 2022-05-18 ENCOUNTER — Encounter: Payer: Self-pay | Admitting: Internal Medicine

## 2022-05-18 ENCOUNTER — Other Ambulatory Visit: Payer: Self-pay | Admitting: Internal Medicine

## 2022-05-18 ENCOUNTER — Other Ambulatory Visit: Payer: Self-pay

## 2022-05-18 ENCOUNTER — Ambulatory Visit (INDEPENDENT_AMBULATORY_CARE_PROVIDER_SITE_OTHER): Payer: Medicare Other | Admitting: Internal Medicine

## 2022-05-18 VITALS — BP 106/71 | HR 96 | Temp 97.9°F | Wt 174.0 lb

## 2022-05-18 DIAGNOSIS — B20 Human immunodeficiency virus [HIV] disease: Secondary | ICD-10-CM | POA: Diagnosis present

## 2022-05-18 DIAGNOSIS — L089 Local infection of the skin and subcutaneous tissue, unspecified: Secondary | ICD-10-CM | POA: Diagnosis not present

## 2022-05-18 DIAGNOSIS — F330 Major depressive disorder, recurrent, mild: Secondary | ICD-10-CM

## 2022-05-18 MED ORDER — PREZCOBIX 800-150 MG PO TABS: ORAL_TABLET | ORAL | 1 refills | Status: DC

## 2022-05-18 NOTE — Progress Notes (Signed)
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   DARUNAVIR-COBICISTAT (PREZCOBIX) 800-150 MG TABLET    TAKE 1 TABLET BY MOUTH DAILY. SWALLOW WHOLE. DO NOT CRUSH, BREAK OR CHEW TABLETS.TK WITH FOOD   DOXYCYCLINE (VIBRAMYCIN) 100 MG CAPSULE    Take 1 capsule (100 mg total) by mouth 2 (two) times daily.   EMTRICITABINE-TENOFOVIR AF (DESCOVY) 200-25 MG TABLET    Take 1 tablet by mouth daily.   IBUPROFEN (ADVIL,MOTRIN) 200 MG TABLET    Take 800 mg by mouth every 6 (six) hours as needed (pain).   PAROXETINE (PAXIL) 40 MG TABLET    TAKE 1 TABLET(40 MG) BY MOUTH DAILY   STUDY - REPRIEVE A5332 - PITAVASTATIN 4 MG OR PLACEBO TABLET    Take 1 tablet (4 mg total) by mouth daily.   STUDY - REPRIEVE [phone] - PITAVASTATIN 4 MG OR PLACEBO TABLET (PI-VAN DAM)    Take one tablet by mouth once daily.  [id number] ACTG H4035  Modified Medications   No medications on file  Discontinued Medications   No medications on file    Subjective: Jordan Calderon is in for his routine HIV follow-up visit.  He denies missing any doses of his Descovy, Prezcobix or trimethoprim/sulfamethoxazole since his visit here a few weeks ago.  He completed his doxycycline therapy but is worried that his right upper back infection may be worse.  He is not having any pain there and has not  had any drainage from his wound.  He has not had any fever.  He is feeling more positive and less depressed now that he is back on his HIV medications.  He again says that he has not had any relapse of his polysubstance abuse recently.  Review of Systems: Review of Systems  Constitutional:  Negative for fever and weight loss.  Psychiatric/Behavioral:  Negative for depression. The patient is nervous/anxious.     Past Medical History:  Diagnosis Date   Anxiety    Cellulitis 02/11/2014   LEFT ARM   Depression    Headache(784.0)    HIV (human immunodeficiency virus infection) (HCC)     Social History   Tobacco Use   Smoking status: Former    Packs/day: 0.10    Years: 30.00    Total pack years: 3.00    Types: Cigarettes    Quit date: 11/19/2014    Years since quitting: 7.4   Smokeless tobacco: Never  Vaping Use   Vaping Use: Never used  Substance Use Topics   Alcohol use: No    Alcohol/week: 0.0 standard drinks of alcohol   Drug use: Not Currently    Family History  Adopted: Yes    Allergies  Allergen Reactions   Sulfonamide  Derivatives Itching    Health Maintenance  Topic Date Due   Medicare Annual Wellness (AWV)  Never done   Zoster Vaccines- Shingrix (1 of 2) Never done   COLONOSCOPY (Pts 45-33yrs Insurance coverage will need to be confirmed)  Never done   COVID-19 Vaccine (4 - 2023-24 season) 02/18/2022   INFLUENZA VACCINE  Completed   Hepatitis C Screening  Completed   HIV Screening  Completed   HPV VACCINES  Aged Out    Objective:  Vitals:   05/18/22 0858  BP: 106/71  Pulse: 96  Temp: 97.9 F (36.6 C)  TempSrc: Oral  SpO2: 96%  Weight: 174 lb (78.9 kg)   Body mass index is 25.7 kg/m.  Physical Exam Constitutional:      Comments: He is smiling and in better spirits.  Cardiovascular:     Rate and Rhythm: Normal rate and regular rhythm.     Heart sounds: No murmur heard. Pulmonary:     Effort: Pulmonary effort is normal.     Breath sounds:  Normal breath sounds.  Skin:    Comments: There is less induration around his right upper back wound.  The margins remain dark purple and he has a central eschar that is about the same size as it was a few weeks ago.  There is no fluctuance or drainage or malodor.  Psychiatric:        Mood and Affect: Mood normal.     Lab Results Lab Results  Component Value Date   WBC 6.0 11/25/2020   HGB 14.0 11/25/2020   HCT 41.2 11/25/2020   MCV 88.0 11/25/2020   PLT 166 11/25/2020    Lab Results  Component Value Date   CREATININE 1.03 11/25/2020   BUN 22 11/25/2020   NA 136 11/25/2020   K 5.2 11/25/2020   CL 104 11/25/2020   CO2 27 11/25/2020    Lab Results  Component Value Date   ALT 38 11/25/2020   AST 33 11/25/2020   ALKPHOS 114 07/02/2018   BILITOT 0.4 11/25/2020    Lab Results  Component Value Date   CHOL 167 10/28/2019   HDL 33 (L) 10/28/2019   LDLCALC 108 (H) 10/28/2019   TRIG 146 10/28/2019   CHOLHDL 5.1 (H) 10/28/2019   Lab Results  Component Value Date   LABRPR REACTIVE (A) 01/07/2021   RPRTITER 1:1 (H) 01/07/2021   HIV 1 RNA Quant  Date Value  04/23/2022 1,050,000 copies/mL  01/07/2021 1,160 Copies/mL (H)  11/25/2020 102,000 Copies/mL (H)   CD4 (/uL)  Date Value  08/01/2018 201 (L)   CD4 T Cell Abs (/uL)  Date Value  01/07/2021 157 (L)  11/25/2020 80 (L)  07/30/2020 123 (L)     Problem List Items Addressed This Visit       Medium    Human immunodeficiency virus (HIV) disease (HCC) - Primary    He has not missed since restarting his antiretroviral medications a few weeks ago.  He will follow-up in 4 weeks for repeat lab work.      Depression    His chronic depression and anxiety are improved now that he is back on HIV therapy.        Unprioritized   Soft tissue infection    I do not think that he has any active infection on his right upper back but he has a wound that will be very slow to heal.         [name], MD [facility]  for Infectious Disease [facility] [phone] pager   [phone] cell 05/18/2022, 9:20 AM

## 2022-05-18 NOTE — Assessment & Plan Note (Signed)
His chronic depression and anxiety are improved now that he is back on HIV therapy.

## 2022-05-18 NOTE — Assessment & Plan Note (Signed)
I do not think that he has any active infection on his right upper back but he has a wound that will be very slow to heal.

## 2022-05-18 NOTE — Assessment & Plan Note (Signed)
He has not missed since restarting his antiretroviral medications a few weeks ago.  He will follow-up in 4 weeks for repeat lab work.

## 2022-06-09 ENCOUNTER — Other Ambulatory Visit: Payer: Self-pay

## 2022-06-09 ENCOUNTER — Ambulatory Visit (INDEPENDENT_AMBULATORY_CARE_PROVIDER_SITE_OTHER): Payer: Medicare Other | Admitting: Internal Medicine

## 2022-06-09 VITALS — BP 121/83 | HR 87 | Temp 97.9°F | Wt 176.4 lb

## 2022-06-09 DIAGNOSIS — F330 Major depressive disorder, recurrent, mild: Secondary | ICD-10-CM | POA: Diagnosis not present

## 2022-06-09 DIAGNOSIS — F1910 Other psychoactive substance abuse, uncomplicated: Secondary | ICD-10-CM | POA: Diagnosis not present

## 2022-06-09 DIAGNOSIS — B20 Human immunodeficiency virus [HIV] disease: Secondary | ICD-10-CM | POA: Diagnosis present

## 2022-06-09 NOTE — Assessment & Plan Note (Signed)
His chronic depression and anxiety are under much better control, most likely because he is now back on HIV treatment and feeling better.

## 2022-06-09 NOTE — Assessment & Plan Note (Signed)
His recent blood work did not reveal any new resistance mutations.  His adherence has been excellent since restarting therapy.  He will get repeat lab work today and continue his current regimen.  He will follow-up in 1 month.

## 2022-06-09 NOTE — Progress Notes (Signed)
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   DARUNAVIR-COBICISTAT (PREZCOBIX) 800-150 MG TABLET    TAKE 1 TABLET BY MOUTH DAILY. SWALLOW WHOLE. DO NOT CRUSH, BREAK OR CHEW TABLETS.TK WITH FOOD   DOXYCYCLINE (VIBRAMYCIN) 100 MG CAPSULE    Take 1 capsule (100 mg total) by mouth 2 (two) times daily.   EMTRICITABINE-TENOFOVIR AF (DESCOVY) 200-25 MG TABLET    TAKE 1 TABLET BY MOUTH DAILY   IBUPROFEN (ADVIL,MOTRIN) 200 MG TABLET    Take 800 mg by mouth every 6 (six) hours as needed (pain).   PAROXETINE (PAXIL) 40 MG TABLET    TAKE 1 TABLET(40 MG) BY MOUTH DAILY   STUDY - REPRIEVE A5332 - PITAVASTATIN 4 MG OR PLACEBO TABLET    Take 1 tablet (4 mg total) by mouth daily.   STUDY - REPRIEVE [phone] - PITAVASTATIN 4 MG OR PLACEBO TABLET (PI-VAN DAM)    Take one tablet by mouth once daily.  [id number] E ACTG E5631  Modified Medications   No medications on file  Discontinued Medications   No medications on file    Subjective: Hatim is in for his routine HIV follow-up visit.  He recently restarted Descovy and Prezcobix after finding out that his HIV viral load had accelerated to 1,050,000.  He denies missing doses.  He says that he is feeling less depressed and anxious.  Has any recent illicit drug use.  The residual eschar on his right upper  back continues to heal slowly after recent treatment for a soft tissue abscess.  Review of Systems: Review of Systems  Constitutional:  Negative for fever and weight loss.  Psychiatric/Behavioral:  Positive for depression. The patient is nervous/anxious.     Past Medical History:  Diagnosis Date   Anxiety    Cellulitis 02/11/2014   LEFT ARM   Depression    Headache(784.0)    HIV (human immunodeficiency virus infection) (HCC)     Social History   Tobacco Use   Smoking status: Former    Packs/day: 0.10    Years: 30.00    Total pack years: 3.00    Types: Cigarettes    Quit date: 11/19/2014    Years since quitting: 7.5   Smokeless tobacco: Never  Vaping Use   Vaping Use: Never used  Substance Use Topics   Alcohol use: No    Alcohol/week: 0.0 standard drinks of alcohol   Drug use: Not Currently    Family History  Adopted: Yes    Allergies  Allergen Reactions   Sulfonamide Derivatives Itching    Health Maintenance  Topic Date Due   Medicare Annual Wellness (AWV)  Never done   Zoster Vaccines- Shingrix (1 of 2) Never done   COLONOSCOPY (  Pts 45-78yrs Insurance coverage will need to be confirmed)  Never done   COVID-19 Vaccine (5 - 2023-24 season) 06/23/2022   DTaP/Tdap/Td (2 - Td or Tdap) 08/16/2031   INFLUENZA VACCINE  Completed   Hepatitis C Screening  Completed   HIV Screening  Completed   HPV VACCINES  Aged Out    Objective:  Vitals:   06/09/22 0937  BP: 121/83  Pulse: 87  Temp: 97.9 F (36.6 C)  TempSrc: Oral  SpO2: 96%  Weight: 176 lb 6.4 oz (80 kg)   Body mass index is 26.05 kg/m.  Physical Exam Constitutional:      Comments: He is in much better spirits since restarting HIV therapy.  His color is better.  HENT:     Mouth/Throat:     Comments: No change in multiple missing teeth. Cardiovascular:     Rate and Rhythm: Normal rate and regular rhythm.     Heart sounds: Normal heart sounds.  Pulmonary:     Effort: Pulmonary effort is normal.      Breath sounds: Normal breath sounds.  Psychiatric:        Mood and Affect: Mood normal.     Lab Results Lab Results  Component Value Date   WBC 6.0 11/25/2020   HGB 14.0 11/25/2020   HCT 41.2 11/25/2020   MCV 88.0 11/25/2020   PLT 166 11/25/2020    Lab Results  Component Value Date   CREATININE 1.03 11/25/2020   BUN 22 11/25/2020   NA 136 11/25/2020   K 5.2 11/25/2020   CL 104 11/25/2020   CO2 27 11/25/2020    Lab Results  Component Value Date   ALT 38 11/25/2020   AST 33 11/25/2020   ALKPHOS 114 07/02/2018   BILITOT 0.4 11/25/2020    Lab Results  Component Value Date   CHOL 167 10/28/2019   HDL 33 (L) 10/28/2019   LDLCALC 108 (H) 10/28/2019   TRIG 146 10/28/2019   CHOLHDL 5.1 (H) 10/28/2019   Lab Results  Component Value Date   LABRPR REACTIVE (A) 01/07/2021   RPRTITER 1:1 (H) 01/07/2021   HIV 1 RNA Quant  Date Value  04/23/2022 1,050,000 copies/mL  01/07/2021 1,160 Copies/mL (H)  11/25/2020 102,000 Copies/mL (H)   CD4 (/uL)  Date Value  08/01/2018 201 (L)   CD4 T Cell Abs (/uL)  Date Value  01/07/2021 157 (L)  11/25/2020 80 (L)  07/30/2020 123 (L)     Problem List Items Addressed This Visit       Medium    Human immunodeficiency virus (HIV) disease (HCC) - Primary    His recent blood work did not reveal any new resistance mutations.  His adherence has been excellent since restarting therapy.  He will get repeat lab work today and continue his current regimen.  He will follow-up in 1 month.      Relevant Orders   T-helper cells (CD4) count (not at [facility])   HIV-1 RNA quant-no reflex-bld   Polysubstance abuse (HCC)    His substance abuse is in remission.      Depression    His chronic depression and anxiety are under much better control, most likely because he is now back on HIV treatment and feeling better.         [name], MD [facility] for Infectious Disease [facility] [phone] pager   [phone] cell 06/09/2022, 9:47 AM

## 2022-06-09 NOTE — Assessment & Plan Note (Signed)
His substance abuse is in remission.

## 2022-06-10 LAB — T-HELPER CELLS (CD4) COUNT (NOT AT ARMC)
CD4 % Helper T Cell: 5 % — ABNORMAL LOW (ref 33–65)
CD4 T Cell Abs: 91 /uL — ABNORMAL LOW (ref 400–1790)

## 2022-06-12 LAB — HIV-1 RNA QUANT-NO REFLEX-BLD
HIV 1 RNA Quant: 955 Copies/mL — ABNORMAL HIGH
HIV-1 RNA Quant, Log: 2.98 Log cps/mL — ABNORMAL HIGH

## 2022-06-16 ENCOUNTER — Other Ambulatory Visit: Payer: Self-pay | Admitting: Internal Medicine

## 2022-06-16 DIAGNOSIS — B20 Human immunodeficiency virus [HIV] disease: Secondary | ICD-10-CM

## 2022-06-16 MED ORDER — PREZCOBIX 800-150 MG PO TABS: ORAL_TABLET | ORAL | 1 refills | Status: DC

## 2022-07-07 ENCOUNTER — Ambulatory Visit (INDEPENDENT_AMBULATORY_CARE_PROVIDER_SITE_OTHER): Payer: Medicare Other | Admitting: Internal Medicine

## 2022-07-07 ENCOUNTER — Other Ambulatory Visit: Payer: Self-pay

## 2022-07-07 ENCOUNTER — Encounter: Payer: Self-pay | Admitting: Internal Medicine

## 2022-07-07 VITALS — BP 139/90 | HR 75 | Temp 97.9°F | Ht 69.0 in | Wt 178.0 lb

## 2022-07-07 DIAGNOSIS — B20 Human immunodeficiency virus [HIV] disease: Secondary | ICD-10-CM

## 2022-07-07 DIAGNOSIS — F330 Major depressive disorder, recurrent, mild: Secondary | ICD-10-CM | POA: Diagnosis not present

## 2022-07-07 DIAGNOSIS — L089 Local infection of the skin and subcutaneous tissue, unspecified: Secondary | ICD-10-CM

## 2022-07-07 NOTE — Assessment & Plan Note (Signed)
The site of his right upper back staph abscess is improving slowly.  I told him that it would take several more months to completely stabilize and would probably leave some scar but that is not going to be a problem for him.

## 2022-07-07 NOTE — Assessment & Plan Note (Signed)
His infection has been coming back under better control since restarting his antiretroviral regimen several months ago.  He will get repeat lab work today to see if his virus is fully suppressing and if he is starting to have some CD4 reconstitution.  He will follow-up.  2 months.

## 2022-07-07 NOTE — Assessment & Plan Note (Signed)
As it usually has in the past, his depression has improved coincident with better adherence with clinic visits and antiretroviral medication.

## 2022-07-07 NOTE — Progress Notes (Signed)
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   DARUNAVIR-COBICISTAT (PREZCOBIX) 800-150 MG TABLET    TAKE 1 TABLET BY MOUTH DAILY. SWALLOW WHOLE. DO NOT CRUSH, BREAK OR CHEW TABLETS.TK WITH FOOD   DESCOVY 200-25 MG TABLET    TAKE 1 TABLET BY MOUTH DAILY   DOXYCYCLINE (VIBRAMYCIN) 100 MG CAPSULE    Take 1 capsule (100 mg total) by mouth 2 (two) times daily.   IBUPROFEN (ADVIL,MOTRIN) 200 MG TABLET    Take 800 mg by mouth every 6 (six) hours as needed (pain).   PAROXETINE (PAXIL) 40 MG TABLET    TAKE 1 TABLET(40 MG) BY MOUTH DAILY   STUDY - REPRIEVE A5332 - PITAVASTATIN 4 MG OR PLACEBO TABLET    Take 1 tablet (4 mg total) by mouth daily.   STUDY - REPRIEVE [phone] - PITAVASTATIN 4 MG OR PLACEBO TABLET (PI-VAN DAM)    Take one tablet by mouth once daily.  [id number] E ACTG V9563  Modified Medications   No medications on file  Discontinued Medications   No medications on file    Subjective: Jordan Calderon is in for his routine HIV follow-up visit.  He has his pillbox and pill bottles with him.  He denies missing any doses of his Descovy, Prezcobix or dapsone.  He is feeling better and less depressed.  He has not had any illicit drug use since his last visit.  The scab on his right upper back where he had a staph abscess is slowly decreasing in  size.  Review of Systems: Review of Systems  Constitutional:  Negative for fever, malaise/fatigue and weight loss.  Psychiatric/Behavioral:  Positive for depression. The patient is nervous/anxious.     Past Medical History:  Diagnosis Date   Anxiety    Cellulitis 02/11/2014   LEFT ARM   Depression    Headache(784.0)    HIV (human immunodeficiency virus infection) (HCC)     Social History   Tobacco Use   Smoking status: Former    Packs/day: 0.10    Years: 30.00    Total pack years: 3.00    Types: Cigarettes    Quit date: 11/19/2014    Years since quitting: 7.6   Smokeless tobacco: Never  Vaping Use   Vaping Use: Never used  Substance Use Topics   Alcohol use: No    Alcohol/week: 0.0 standard drinks of alcohol   Drug use: Not Currently    Family History  Adopted: Yes    Allergies  Allergen Reactions   Sulfonamide Derivatives Itching    Health Maintenance  Topic Date Due   Medicare Annual Wellness (AWV)  Never done   Zoster Vaccines- Shingrix (1 of 2) Never done   COLONOSCOPY (Pts 45-89yrs  Insurance coverage will need to be confirmed)  Never done   COVID-19 Vaccine (5 - 2023-24 season) 06/23/2022   DTaP/Tdap/Td (2 - Td or Tdap) 08/16/2031   INFLUENZA VACCINE  Completed   Hepatitis C Screening  Completed   HIV Screening  Completed   HPV VACCINES  Aged Out    Objective:  Vitals:   07/07/22 0935  BP: (!) 139/90  Pulse: 75  Temp: 97.9 F (36.6 C)  TempSrc: Oral  SpO2: 95%  Weight: 178 lb (80.7 kg)  Height: 5\' 9"  (1.753 m)   Body mass index is 26.29 kg/m.  Physical Exam Constitutional:      Comments: He is smiling, pleasant and in better spirits over the past several months since restarting his antiretroviral therapy.  Cardiovascular:     Rate and Rhythm: Normal rate and regular rhythm.     Heart sounds: No murmur heard. Pulmonary:     Effort: Pulmonary effort is normal.     Breath sounds: Normal breath sounds.  Skin:    Comments: The scab on  his right upper back is now much smaller and less than dime sized.  There is no fluctuance or erythema to suggest persistent abscess.  Psychiatric:        Mood and Affect: Mood normal.     Lab Results Lab Results  Component Value Date   WBC 6.0 11/25/2020   HGB 14.0 11/25/2020   HCT 41.2 11/25/2020   MCV 88.0 11/25/2020   PLT 166 11/25/2020    Lab Results  Component Value Date   CREATININE 1.03 11/25/2020   BUN 22 11/25/2020   NA 136 11/25/2020   K 5.2 11/25/2020   CL 104 11/25/2020   CO2 27 11/25/2020    Lab Results  Component Value Date   ALT 38 11/25/2020   AST 33 11/25/2020   ALKPHOS 114 07/02/2018   BILITOT 0.4 11/25/2020    Lab Results  Component Value Date   CHOL 167 10/28/2019   HDL 33 (L) 10/28/2019   LDLCALC 108 (H) 10/28/2019   TRIG 146 10/28/2019   CHOLHDL 5.1 (H) 10/28/2019   Lab Results  Component Value Date   LABRPR REACTIVE (A) 01/07/2021   RPRTITER 1:1 (H) 01/07/2021   HIV 1 RNA Quant  Date Value  06/09/2022 955 Copies/mL (H)  04/23/2022 1,050,000 copies/mL  01/07/2021 1,160 Copies/mL (H)   CD4 (/uL)  Date Value  08/01/2018 201 (L)   CD4 T Cell Abs (/uL)  Date Value  06/09/2022 91 (L)  01/07/2021 157 (L)  11/25/2020 80 (L)     Problem List Items Addressed This Visit       Medium    Human immunodeficiency virus (HIV) disease ([place]) - Primary    His infection has been coming back under better control since restarting his antiretroviral regimen several months ago.  He will get repeat lab work today to see if his virus is fully suppressing and if he is starting to have some CD4 reconstitution.  He will follow-up.  2 months.      Relevant Orders   T-helper cells (CD4) count (not at [facility])   HIV-1 RNA quant-no reflex-bld   Depression    As it usually has in the past, his depression has improved coincident with better adherence with clinic visits and antiretroviral medication.        Unprioritized   Soft tissue infection    The  site of his right upper back staph abscess is improving slowly.  I  told him that it would take several more months to completely stabilize and would probably leave some scar but that is not going to be a problem for him.         Michel Bickers, MD [facility] for Infectious [place] [facility] [phone] pager   [phone] cell 07/07/2022, 9:51 AM

## 2022-07-08 LAB — T-HELPER CELLS (CD4) COUNT (NOT AT ARMC)
CD4 % Helper T Cell: 6 % — ABNORMAL LOW (ref 33–65)
CD4 T Cell Abs: 104 /uL — ABNORMAL LOW (ref 400–1790)

## 2022-07-12 ENCOUNTER — Other Ambulatory Visit: Payer: Self-pay

## 2022-07-12 DIAGNOSIS — B20 Human immunodeficiency virus [HIV] disease: Secondary | ICD-10-CM

## 2022-07-12 LAB — HIV-1 RNA QUANT-NO REFLEX-BLD
HIV 1 RNA Quant: 158 Copies/mL — ABNORMAL HIGH
HIV-1 RNA Quant, Log: 2.2 Log cps/mL — ABNORMAL HIGH

## 2022-07-12 MED ORDER — PREZCOBIX 800-150 MG PO TABS: ORAL_TABLET | ORAL | 5 refills | Status: DC

## 2022-07-12 MED ORDER — DESCOVY 200-25 MG PO TABS: 1.0000 | ORAL_TABLET | Freq: Every day | ORAL | 5 refills | Status: DC

## 2022-08-09 NOTE — Progress Notes (Signed)
Error

## 2022-09-07 ENCOUNTER — Other Ambulatory Visit: Payer: Self-pay

## 2022-09-07 ENCOUNTER — Encounter: Payer: Self-pay | Admitting: Internal Medicine

## 2022-09-07 ENCOUNTER — Ambulatory Visit (INDEPENDENT_AMBULATORY_CARE_PROVIDER_SITE_OTHER): Payer: Medicare Other | Admitting: Internal Medicine

## 2022-09-07 VITALS — BP 138/84 | HR 86 | Temp 98.1°F | Wt 170.0 lb

## 2022-09-07 DIAGNOSIS — F1910 Other psychoactive substance abuse, uncomplicated: Secondary | ICD-10-CM | POA: Diagnosis not present

## 2022-09-07 DIAGNOSIS — B20 Human immunodeficiency virus [HIV] disease: Secondary | ICD-10-CM | POA: Diagnosis present

## 2022-09-07 DIAGNOSIS — F330 Major depressive disorder, recurrent, mild: Secondary | ICD-10-CM | POA: Diagnosis not present

## 2022-09-07 MED ORDER — DARUN-COBIC-EMTRICIT-TENOFAF 800-150-200-10 MG PO TABS: 1.0000 | ORAL_TABLET | Freq: Every day | ORAL | 11 refills | Status: DC

## 2022-09-07 NOTE — Progress Notes (Signed)
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  Previous Medications   DAPSONE 100 MG TABLET    Take 1 tablet (100 mg total) by mouth daily.   DOXYCYCLINE (VIBRAMYCIN) 100 MG CAPSULE    Take 1 capsule (100 mg total) by mouth 2 (two) times daily.  Modified Medications   No medications on file  Discontinued Medications   DARUNAVIR-COBICISTAT (PREZCOBIX) 800-150 MG TABLET    TAKE 1 TABLET BY MOUTH DAILY. SWALLOW WHOLE. DO NOT CRUSH, BREAK OR CHEW TABLETS.TK WITH FOOD   EMTRICITABINE-TENOFOVIR AF (DESCOVY) 200-25 MG TABLET    Take 1 tablet by mouth daily.   IBUPROFEN (ADVIL,MOTRIN) 200 MG TABLET    Take 800 mg by mouth every 6 (six) hours as needed (pain).   PAROXETINE (PAXIL) 40 MG TABLET    TAKE 1 TABLET(40 MG) BY MOUTH DAILY   STUDY - REPRIEVE [id number] - PITAVASTATIN 4 MG OR PLACEBO TABLET    Take 1 tablet (4 mg total) by mouth daily.   STUDY - REPRIEVE [phone] - PITAVASTATIN 4 MG OR PLACEBO TABLET (PI-VAN DAM)    Take one tablet by mouth once daily.  [id number] [id number]    Subjective: Dhani is in for his routine HIV follow-up visit.  He has not had any problems obtaining, taking or tolerating his Descovy, Symtuza or dapsone.  He has been using a pillbox and has not missed doses.  His sister, Barnett Applebaum, recently realized  that he was no longer on paroxetine.  He is not sure how long he has been out of it but he has not noted any increase in his anxiety or depression.  He has not had any relapse of his polysubstance use.  Review of Systems: Review of Systems  Constitutional:  Negative for fever and weight loss.  Psychiatric/Behavioral:  Positive for depression. The patient is nervous/anxious.     Past Medical History:  Diagnosis Date   Anxiety    Cellulitis 02/11/2014   LEFT ARM   Depression    Headache(784.0)    HIV (human immunodeficiency virus infection) ([place])     Social History   Tobacco Use   Smoking status: Former    Packs/day: 0.10    Years: 30.00    Additional pack years: 0.00    Total pack years: 3.00    Types: Cigarettes    Quit date: 11/19/2014    Years since quitting: 7.8   Smokeless tobacco: Never  Vaping Use   Vaping Use: Never used  Substance Use Topics   Alcohol use: No    Alcohol/week: 0.0 standard drinks of alcohol   Drug use: Not Currently    Family History  Adopted: Yes    Allergies  Allergen Reactions   Sulfonamide Derivatives Itching  Health Maintenance  Topic Date Due   Medicare Annual Wellness (AWV)  Never done   Zoster Vaccines- Shingrix (1 of 2) Never done   COLONOSCOPY (Pts 45-52yrs Insurance coverage will need to be confirmed)  Never done   COVID-19 Vaccine (5 - 2023-24 season) 06/23/2022   DTaP/Tdap/Td (2 - Td or Tdap) 08/16/2031   INFLUENZA VACCINE  Completed   Hepatitis C Screening  Completed   HIV Screening  Completed   HPV VACCINES  Aged Out    Objective:  Vitals:   09/07/22 0938  BP: 138/84  Pulse: 86  Temp: 98.1 F (36.7 C)  TempSrc: Temporal  SpO2: 93%  Weight: 170 lb (77.1 kg)   Body mass index is 25.1 kg/m.  Physical Exam Constitutional:      Comments: He is in good spirits.  He has his medication bottles and pillbox with him.  Became tearful when talking about my impending retirement.  Cardiovascular:     Rate and  Rhythm: Normal rate and regular rhythm.  Pulmonary:     Effort: Pulmonary effort is normal.     Breath sounds: Normal breath sounds.     Lab Results Lab Results  Component Value Date   WBC 6.0 11/25/2020   HGB 14.0 11/25/2020   HCT 41.2 11/25/2020   MCV 88.0 11/25/2020   PLT 166 11/25/2020    Lab Results  Component Value Date   CREATININE 1.03 11/25/2020   BUN 22 11/25/2020   NA 136 11/25/2020   K 5.2 11/25/2020   CL 104 11/25/2020   CO2 27 11/25/2020    Lab Results  Component Value Date   ALT 38 11/25/2020   AST 33 11/25/2020   ALKPHOS 114 07/02/2018   BILITOT 0.4 11/25/2020    Lab Results  Component Value Date   CHOL 167 10/28/2019   HDL 33 (L) 10/28/2019   LDLCALC 108 (H) 10/28/2019   TRIG 146 10/28/2019   CHOLHDL 5.1 (H) 10/28/2019   Lab Results  Component Value Date   LABRPR REACTIVE (A) 01/07/2021   RPRTITER 1:1 (H) 01/07/2021   HIV 1 RNA Quant  Date Value  07/07/2022 158 Copies/mL (H)  06/09/2022 955 Copies/mL (H)  04/23/2022 1,050,000 copies/mL   CD4 (/uL)  Date Value  08/01/2018 201 (L)   CD4 T Cell Abs (/uL)  Date Value  07/07/2022 104 (L)  06/09/2022 91 (L)  01/07/2021 157 (L)     Problem List Items Addressed This Visit       Medium    Depression    I suspect that he has been off of paroxetine for the last 6 months and was not even aware of it.  His chronic depression and anxiety are actually under much better control so I see no need to restart it now.      Human immunodeficiency virus (HIV) disease ([place]) - Primary    His infection has come under much better control since restarting antiretroviral therapy last fall.  He is having very slow CD4 reconstitution.  I will consolidate his antiretroviral therapy to [place] and have him continue dapsone until his CD4 count is back above 200.  He will update his lab work today and follow-up in 4 months.      Relevant Medications   Darunavir-Cobicistat-Emtricitabine-Tenofovir Alafenamide  (SYMTUZA) 800-150-200-10 MG TABS   Other Relevant Orders   T-helper cells (CD4) count (not at [facility])   HIV-1 RNA quant-no reflex-bld   Polysubstance abuse ([place])    Encouraged him to continue  his abstinence from drugs.         Michel Bickers, MD [facility] [place] [facility] [phone] pager   [phone] cell 09/07/2022, 9:59 AM

## 2022-09-07 NOTE — Assessment & Plan Note (Signed)
Encouraged him to continue his abstinence from drugs.

## 2022-09-07 NOTE — Assessment & Plan Note (Signed)
I suspect that he has been off of paroxetine for the last 6 months and was not even aware of it.  His chronic depression and anxiety are actually under much better control so I see no need to restart it now.

## 2022-09-07 NOTE — Assessment & Plan Note (Signed)
His infection has come under much better control since restarting antiretroviral therapy last fall.  He is having very slow CD4 reconstitution.  I will consolidate his antiretroviral therapy to [place] and have him continue dapsone until his CD4 count is back above 200.  He will update his lab work today and follow-up in 4 months.

## 2022-09-08 LAB — T-HELPER CELLS (CD4) COUNT (NOT AT ARMC)
CD4 % Helper T Cell: 7 % — ABNORMAL LOW (ref 33–65)
CD4 T Cell Abs: 114 /uL — ABNORMAL LOW (ref 400–1790)

## 2022-09-09 LAB — HIV-1 RNA QUANT-NO REFLEX-BLD
HIV 1 RNA Quant: 29 Copies/mL — ABNORMAL HIGH
HIV-1 RNA Quant, Log: 1.46 Log cps/mL — ABNORMAL HIGH

## 2022-09-14 IMAGING — DX DG HAND COMPLETE 3+V*L*
3 series · 3 of 3 positions shown · non-contrast
Comparison: None.

CLINICAL DATA: Dog bite

EXAM:
LEFT HAND - COMPLETE 3+ VIEW

[hand ap]
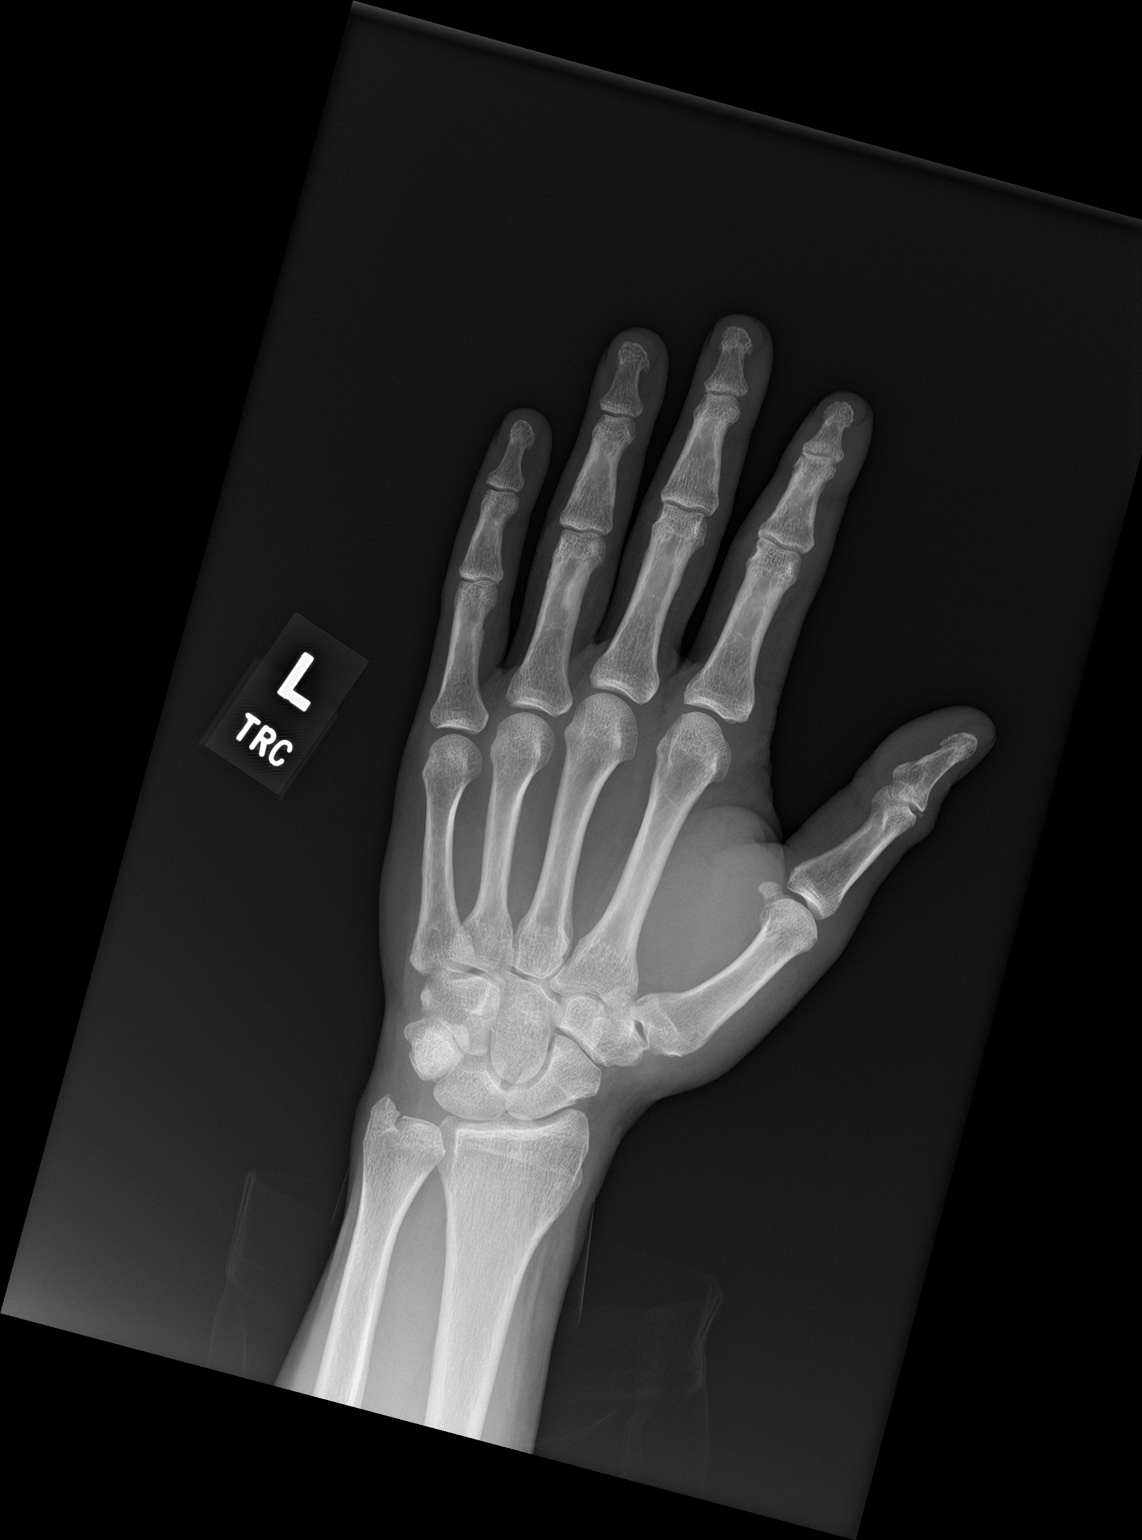

[hand obl]
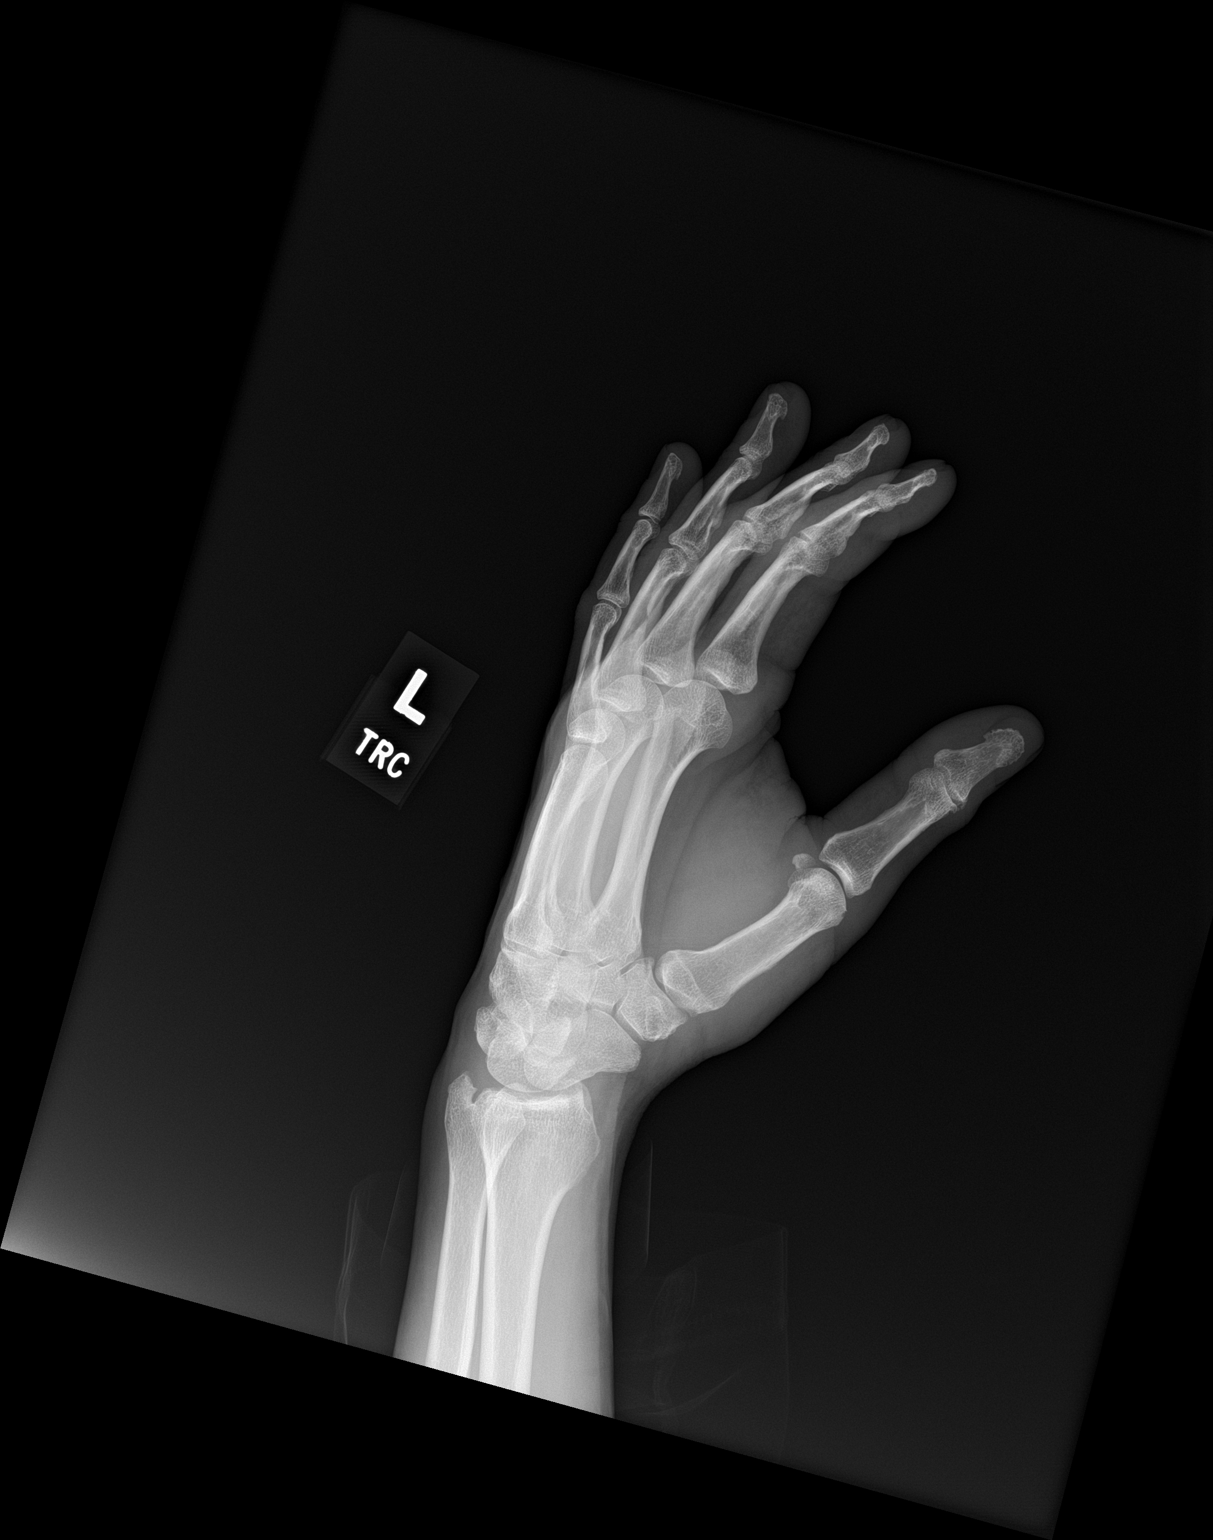

[hand lat]
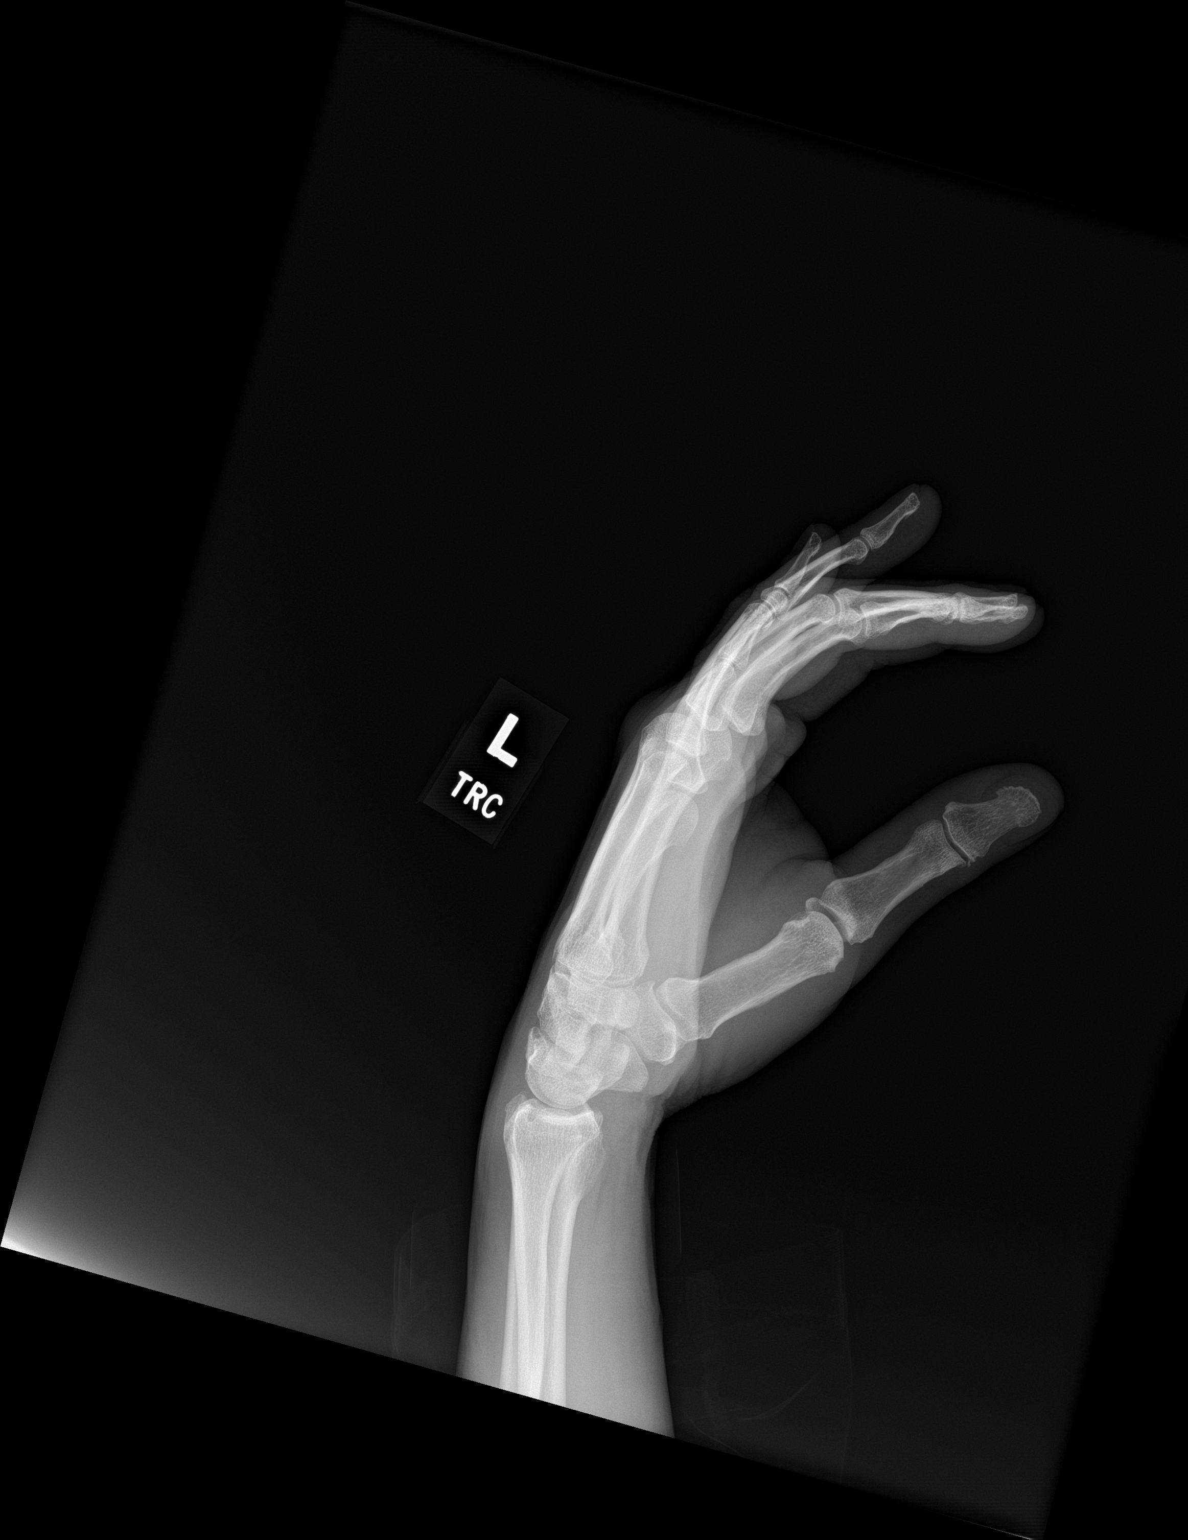

[3 of 3 positions shown; findings below may reference images not displayed]

FINDINGS: No fracture or dislocation is seen. There are no radiopaque foreign
bodies.
IMPRESSION: No radiographic abnormality is seen in the left hand.

## 2022-11-30 NOTE — Progress Notes (Signed)
ASCVD Risk Score: 14%  Sex: Male Race: White  Age: 62 Total Cholesterol (mg/dL) 161 HDL Cholesterol (mg/dL) 33 LDL Cholesterol (mg/dL) 096 Systolic Blood Pressure (mm Hg) 138 Diastolic Blood Pressure (mm Hg) 84 Diabetes: No Smoker: Former Treatment for Hypertension: No Aspirin Therapy: No Statin: No  Sandie Ano, RN

## 2023-01-17 ENCOUNTER — Other Ambulatory Visit: Payer: Self-pay

## 2023-01-17 ENCOUNTER — Ambulatory Visit (INDEPENDENT_AMBULATORY_CARE_PROVIDER_SITE_OTHER): Payer: Medicare Other | Admitting: Internal Medicine

## 2023-01-17 ENCOUNTER — Encounter: Payer: Self-pay | Admitting: Internal Medicine

## 2023-01-17 VITALS — BP 120/80 | HR 61 | Resp 16 | Ht 69.0 in | Wt 172.0 lb

## 2023-01-17 DIAGNOSIS — B1920 Unspecified viral hepatitis C without hepatic coma: Secondary | ICD-10-CM

## 2023-01-17 DIAGNOSIS — Z79899 Other long term (current) drug therapy: Secondary | ICD-10-CM | POA: Diagnosis not present

## 2023-01-17 DIAGNOSIS — B20 Human immunodeficiency virus [HIV] disease: Secondary | ICD-10-CM | POA: Diagnosis not present

## 2023-01-17 MED ORDER — DAPSONE 100 MG PO TABS: 100.0000 mg | ORAL_TABLET | Freq: Every day | ORAL | 5 refills | Status: DC

## 2023-01-17 MED ORDER — DARUN-COBIC-EMTRICIT-TENOFAF 800-150-200-10 MG PO TABS: 1.0000 | ORAL_TABLET | Freq: Every day | ORAL | 11 refills | Status: DC

## 2023-01-17 NOTE — Assessment & Plan Note (Signed)
Will recheck his Ab which was negative 5 years ago

## 2023-01-17 NOTE — Progress Notes (Signed)
   Subjective:    Patient ID: Jordan Calderon, male    DOB: [birth year], 62 y.o.   MRN: [id number]  HPI Mr Anglade is here for follow up of HIV He has a history of substance abuse but has been doing well and on his ARVs since last Fall.  He continues on Prezcobix and Descovy (did not change yet) and dapsone for prophylaxis with his last CD4 count up to 114.  He has not been sexually active.     Review of Systems  Constitutional:  Negative for fatigue.  Gastrointestinal:  Negative for diarrhea and nausea.       Objective:   Physical Exam Eyes:     General: No scleral icterus. Pulmonary:     Effort: Pulmonary effort is normal.  Neurological:     Mental Status: He is alert.   SH: remains drug-free        Assessment & Plan:

## 2023-01-17 NOTE — Assessment & Plan Note (Signed)
Will check his lipid panel and consider discussing the Reprieve trial next visit.

## 2023-01-17 NOTE — Assessment & Plan Note (Signed)
He continues to do well and will confirm this with his labs today.  He reports good adherence.  I discussed that he can take Symtuza to replace the Prezcobix and Descovy since it is the combo of the two.  Labs today and can rtc in 3 months.

## 2023-03-03 NOTE — Progress Notes (Signed)
The 10-year ASCVD risk score (Arnett DK, et al., 2019) is: 10.7%   Values used to calculate the score:     Age: 62 years     Sex: Male     Is Non-Hispanic African American: No     Diabetic: No     Tobacco smoker: No     Systolic Blood Pressure: 120 mmHg     Is BP treated: No     HDL Cholesterol: 32 mg/dL     Total Cholesterol: 156 mg/dL  Sandie Ano, RN

## 2023-04-19 ENCOUNTER — Ambulatory Visit: Payer: Medicare Other | Admitting: Internal Medicine

## 2023-04-19 ENCOUNTER — Encounter: Payer: Self-pay | Admitting: Internal Medicine

## 2023-04-19 ENCOUNTER — Other Ambulatory Visit: Payer: Self-pay

## 2023-04-19 ENCOUNTER — Other Ambulatory Visit (HOSPITAL_COMMUNITY)
Admission: RE | Admit: 2023-04-19 | Discharge: 2023-04-19 | Disposition: A | Discharge status: Home / Self Care | Payer: Medicare Other | Source: Ambulatory Visit | Attending: Internal Medicine | Admitting: Internal Medicine

## 2023-04-19 VITALS — BP 125/87 | HR 74 | Resp 16 | Ht 69.0 in | Wt 174.0 lb

## 2023-04-19 DIAGNOSIS — Z79899 Other long term (current) drug therapy: Secondary | ICD-10-CM

## 2023-04-19 DIAGNOSIS — B20 Human immunodeficiency virus [HIV] disease: Secondary | ICD-10-CM

## 2023-04-19 DIAGNOSIS — Z113 Encounter for screening for infections with a predominantly sexual mode of transmission: Secondary | ICD-10-CM | POA: Diagnosis present

## 2023-04-19 MED ORDER — ROSUVASTATIN CALCIUM 10 MG PO TABS: 10.0000 mg | ORAL_TABLET | Freq: Every day | ORAL | 11 refills | Status: DC

## 2023-04-19 NOTE — Assessment & Plan Note (Signed)
Will do routine screening.

## 2023-04-19 NOTE — Assessment & Plan Note (Signed)
He is doing well, no new concerns and taking his medication.  Previous lab reviewed with him Follow up in 4 months.

## 2023-04-19 NOTE — Assessment & Plan Note (Signed)
With new research coming out by way of the REPRIEVE study regarding cardiovascular event risk reduction for PLWH, I discussed that over the 8 year trial initiation of statin (pitavastatin) therapy was shown to reduce CV disease by 35% independent of other risk factors.   The 10-year ASCVD risk score (Arnett DK, et al., 2019) is: 11.5%   Values used to calculate the score:     Age: 62 years     Sex: Male     Is Non-Hispanic African American: No     Diabetic: No     Tobacco smoker: No     Systolic Blood Pressure: 125 mmHg     Is BP treated: No     HDL Cholesterol: 32 mg/dL     Total Cholesterol: 156 mg/dL  We discussed the patient's 10-year CVD Risk Score to be at least moderately elevated, and would benefit from intervention given HIV+ and > 61 yo.   Will proceed with lower-intensity statin (rosuvastatin 10 mg) given non-diabetic.

## 2023-04-19 NOTE — Progress Notes (Signed)
Subjective:    Patient ID: Jordan Calderon, male    DOB: 12-Jul-1960, 62 y.o.   MRN: [id number]  HPI Yancy is here for follow up of HIV He continues on Symtuza and denies any missed doses. CD4 has remained low but was not detected last visit.  Previous issues with substance abuse.  No complaints.  No issues getting or taking his medication.    Review of Systems  Constitutional:  Negative for fatigue.  Gastrointestinal:  Negative for diarrhea and nausea.  Skin:  Negative for rash.       Objective:   Physical Exam Eyes:     General: No scleral icterus. Neurological:     Mental Status: He is alert.    SH: no current tobacco use       Assessment & Plan:

## 2023-04-21 DIAGNOSIS — C8590 Non-Hodgkin lymphoma, unspecified, unspecified site: Secondary | ICD-10-CM

## 2023-04-21 HISTORY — DX: Non-Hodgkin lymphoma, unspecified, unspecified site: C85.90

## 2023-04-21 LAB — T-HELPER CELL (CD4) - (RCID CLINIC ONLY)
CD4 % Helper T Cell: 9 % — ABNORMAL LOW (ref 33–65)
CD4 T Cell Abs: 132 /uL — ABNORMAL LOW (ref 400–1790)

## 2023-04-22 LAB — RPR TITER: RPR Titer: 1:1 {titer} — ABNORMAL HIGH

## 2023-04-22 LAB — HIV-1 RNA QUANT-NO REFLEX-BLD
HIV 1 RNA Quant: 23 {copies}/mL — ABNORMAL HIGH
HIV-1 RNA Quant, Log: 1.37 {Log_copies}/mL — ABNORMAL HIGH

## 2023-04-22 LAB — RPR: RPR Ser Ql: REACTIVE — AB

## 2023-04-22 LAB — T PALLIDUM AB: T Pallidum Abs: POSITIVE — AB

## 2023-04-26 ENCOUNTER — Other Ambulatory Visit: Payer: Self-pay | Admitting: Internal Medicine

## 2023-04-26 ENCOUNTER — Telehealth: Payer: Self-pay

## 2023-04-26 DIAGNOSIS — R85610 Atypical squamous cells of undetermined significance on cytologic smear of anus (ASC-US): Secondary | ICD-10-CM | POA: Insufficient documentation

## 2023-04-26 LAB — CYTOLOGY - PAP
Comment: NEGATIVE
Diagnosis: UNDETERMINED — AB
High risk HPV: POSITIVE — AB

## 2023-04-26 NOTE — Telephone Encounter (Signed)
-----   Message from Belia Heman Comer sent at 04/26/2023  1:38 PM EST ----- Could you let him know his anal pap was positive and I have referred him to surgery. thanks

## 2023-05-02 NOTE — Telephone Encounter (Signed)
Called patient to discuss results, no answer. Left HIPAA compliant voicemail requesting callback.   Alysah Carton D Markevius Trombetta, RN  

## 2023-05-03 NOTE — Telephone Encounter (Signed)
Second attempt to reach [facility], no answer. Unable to leave message.   Sandie Ano, RN

## 2023-05-04 NOTE — Telephone Encounter (Signed)
Third attempt to reach patient by phone, no answer. Left HIPAA compliant voicemail requesting callback.   Sandie Ano, RN

## 2023-05-10 NOTE — Telephone Encounter (Signed)
I spoke to the patient and informed of anal pap results. Patient has an appointment with general surgery in January. Elynore Dolinski Jonathon Resides, CMA

## 2023-05-11 ENCOUNTER — Other Ambulatory Visit: Payer: Self-pay

## 2023-05-11 ENCOUNTER — Emergency Department (HOSPITAL_BASED_OUTPATIENT_CLINIC_OR_DEPARTMENT_OTHER): Payer: Medicare Other

## 2023-05-11 ENCOUNTER — Observation Stay (HOSPITAL_BASED_OUTPATIENT_CLINIC_OR_DEPARTMENT_OTHER)
Admission: EM | Admit: 2023-05-11 | Discharge: 2023-05-12 | Disposition: A | Discharge status: Home / Self Care | Payer: Medicare Other | Attending: Internal Medicine | Admitting: Internal Medicine

## 2023-05-11 ENCOUNTER — Encounter (HOSPITAL_BASED_OUTPATIENT_CLINIC_OR_DEPARTMENT_OTHER): Payer: Self-pay

## 2023-05-11 DIAGNOSIS — N179 Acute kidney failure, unspecified: Secondary | ICD-10-CM | POA: Diagnosis not present

## 2023-05-11 DIAGNOSIS — J069 Acute upper respiratory infection, unspecified: Secondary | ICD-10-CM | POA: Diagnosis present

## 2023-05-11 DIAGNOSIS — Z79899 Other long term (current) drug therapy: Secondary | ICD-10-CM | POA: Insufficient documentation

## 2023-05-11 DIAGNOSIS — Z1152 Encounter for screening for COVID-19: Secondary | ICD-10-CM | POA: Insufficient documentation

## 2023-05-11 DIAGNOSIS — E039 Hypothyroidism, unspecified: Secondary | ICD-10-CM | POA: Diagnosis not present

## 2023-05-11 DIAGNOSIS — Z87891 Personal history of nicotine dependence: Secondary | ICD-10-CM | POA: Insufficient documentation

## 2023-05-11 DIAGNOSIS — R55 Syncope and collapse: Secondary | ICD-10-CM | POA: Diagnosis not present

## 2023-05-11 DIAGNOSIS — R001 Bradycardia, unspecified: Secondary | ICD-10-CM

## 2023-05-11 DIAGNOSIS — B20 Human immunodeficiency virus [HIV] disease: Secondary | ICD-10-CM | POA: Diagnosis present

## 2023-05-11 DIAGNOSIS — R519 Headache, unspecified: Secondary | ICD-10-CM | POA: Diagnosis present

## 2023-05-11 DIAGNOSIS — B349 Viral infection, unspecified: Secondary | ICD-10-CM

## 2023-05-11 LAB — COMPREHENSIVE METABOLIC PANEL
ALT: 33 U/L (ref 0–44)
AST: 35 U/L (ref 15–41)
Albumin: 4.1 g/dL (ref 3.5–5.0)
Alkaline Phosphatase: 111 U/L (ref 38–126)
Anion gap: 10 (ref 5–15)
BUN: 27 mg/dL — ABNORMAL HIGH (ref 8–23)
CO2: 20 mmol/L — ABNORMAL LOW (ref 22–32)
Calcium: 9.1 mg/dL (ref 8.9–10.3)
Chloride: 102 mmol/L (ref 98–111)
Creatinine, Ser: 1.58 mg/dL — ABNORMAL HIGH (ref 0.61–1.24)
GFR, Estimated: 49 mL/min — ABNORMAL LOW (ref >60)
Glucose, Bld: 155 mg/dL — ABNORMAL HIGH (ref 70–99)
Potassium: 3.7 mmol/L (ref 3.5–5.1)
Sodium: 132 mmol/L — ABNORMAL LOW (ref 135–145)
Total Bilirubin: 1.2 mg/dL — ABNORMAL HIGH (ref <1.2)
Total Protein: 8.2 g/dL — ABNORMAL HIGH (ref 6.5–8.1)

## 2023-05-11 LAB — CBC WITH DIFFERENTIAL/PLATELET
Abs Immature Granulocytes: 0.05 10*3/uL (ref 0.00–0.07)
Basophils Absolute: 0.1 10*3/uL (ref 0.0–0.1)
Basophils Relative: 1 %
Eosinophils Absolute: 0.4 10*3/uL (ref 0.0–0.5)
Eosinophils Relative: 3 %
HCT: 37.7 % — ABNORMAL LOW (ref 39.0–52.0)
Hemoglobin: 13 g/dL (ref 13.0–17.0)
Immature Granulocytes: 0 %
Lymphocytes Relative: 30 %
Lymphs Abs: 3.7 10*3/uL (ref 0.7–4.0)
MCH: 32.5 pg (ref 26.0–34.0)
MCHC: 34.5 g/dL (ref 30.0–36.0)
MCV: 94.3 fL (ref 80.0–100.0)
Monocytes Absolute: 1.3 10*3/uL — ABNORMAL HIGH (ref 0.1–1.0)
Monocytes Relative: 11 %
Neutro Abs: 6.7 10*3/uL (ref 1.7–7.7)
Neutrophils Relative %: 55 %
Platelets: 325 10*3/uL (ref 150–400)
RBC: 4 MIL/uL — ABNORMAL LOW (ref 4.22–5.81)
RDW: 12.7 % (ref 11.5–15.5)
WBC: 12.3 10*3/uL — ABNORMAL HIGH (ref 4.0–10.5)
nRBC: 0 % (ref 0.0–0.2)

## 2023-05-11 LAB — TROPONIN I (HIGH SENSITIVITY)
Troponin I (High Sensitivity): 27 ng/L — ABNORMAL HIGH (ref <18)
Troponin I (High Sensitivity): 21 ng/L — ABNORMAL HIGH (ref <18)

## 2023-05-11 LAB — RESP PANEL BY RT-PCR (RSV, FLU A&B, COVID)  RVPGX2
Influenza A by PCR: NEGATIVE
Influenza B by PCR: NEGATIVE
Resp Syncytial Virus by PCR: NEGATIVE
SARS Coronavirus 2 by RT PCR: NEGATIVE

## 2023-05-11 LAB — MAGNESIUM: Magnesium: 2.2 mg/dL (ref 1.7–2.4)

## 2023-05-11 LAB — TSH: TSH: 7.784 u[IU]/mL — ABNORMAL HIGH (ref 0.350–4.500)

## 2023-05-11 LAB — CBG MONITORING, ED: Glucose-Capillary: 161 mg/dL — ABNORMAL HIGH (ref 70–99)

## 2023-05-11 LAB — LACTIC ACID, PLASMA
Lactic Acid, Venous: 3.6 mmol/L (ref 0.5–1.9)
Lactic Acid, Venous: 1.4 mmol/L (ref 0.5–1.9)

## 2023-05-11 LAB — T4, FREE: Free T4: 0.9 ng/dL (ref 0.61–1.12)

## 2023-05-11 LAB — GROUP A STREP BY PCR: Group A Strep by PCR: NOT DETECTED

## 2023-05-11 MED ORDER — ACETAMINOPHEN 650 MG RE SUPP
650.0000 mg | Freq: Four times a day (QID) | RECTAL | Status: DC | PRN
Start: 2023-05-11 — End: 2023-05-12

## 2023-05-11 MED ORDER — DARUN-COBIC-EMTRICIT-TENOFAF 800-150-200-10 MG PO TABS
1.0000 | ORAL_TABLET | Freq: Every day | ORAL | Status: DC
  Administered 2023-05-12: 1 via ORAL
  Filled 2023-05-11 (×2): qty 1

## 2023-05-11 MED ORDER — ACETAMINOPHEN 325 MG PO TABS
650.0000 mg | ORAL_TABLET | Freq: Four times a day (QID) | ORAL | Status: DC | PRN
  Administered 2023-05-12: 650 mg via ORAL
  Filled 2023-05-11: qty 2

## 2023-05-11 MED ORDER — ENOXAPARIN SODIUM 40 MG/0.4ML IJ SOSY
40.0000 mg | PREFILLED_SYRINGE | INTRAMUSCULAR | Status: DC
  Administered 2023-05-11: 40 mg via SUBCUTANEOUS
  Filled 2023-05-11: qty 0.4

## 2023-05-11 MED ORDER — BENZONATATE 100 MG PO CAPS
100.0000 mg | ORAL_CAPSULE | Freq: Once | ORAL | Status: AC
  Administered 2023-05-11: 100 mg via ORAL
  Filled 2023-05-11: qty 1

## 2023-05-11 MED ORDER — ROSUVASTATIN CALCIUM 5 MG PO TABS
10.0000 mg | ORAL_TABLET | Freq: Every day | ORAL | Status: DC
  Administered 2023-05-11 – 2023-05-12 (×2): 10 mg via ORAL
  Filled 2023-05-11 (×2): qty 2

## 2023-05-11 MED ORDER — CALCIUM CARBONATE ANTACID 500 MG PO CHEW
1.0000 | CHEWABLE_TABLET | Freq: Once | ORAL | Status: AC
  Administered 2023-05-11: 200 mg via ORAL
  Filled 2023-05-11: qty 1

## 2023-05-11 MED ORDER — DAPSONE 100 MG PO TABS
100.0000 mg | ORAL_TABLET | Freq: Every day | ORAL | Status: DC
  Administered 2023-05-12: 100 mg via ORAL
  Filled 2023-05-11 (×2): qty 1

## 2023-05-11 MED ORDER — SODIUM CHLORIDE 0.9% FLUSH
3.0000 mL | Freq: Two times a day (BID) | INTRAVENOUS | Status: DC
  Administered 2023-05-11 – 2023-05-12 (×2): 3 mL via INTRAVENOUS

## 2023-05-11 MED ORDER — ONDANSETRON HCL 4 MG/2ML IJ SOLN: 4.0000 mg | Freq: Four times a day (QID) | INTRAMUSCULAR | Status: DC | PRN

## 2023-05-11 MED ORDER — ONDANSETRON HCL 4 MG PO TABS: 4.0000 mg | ORAL_TABLET | Freq: Four times a day (QID) | ORAL | Status: DC | PRN

## 2023-05-11 NOTE — Plan of Care (Signed)
62 year old male with history of HIV on medications, hyperlipidemia who presented initially to [facility] with complaint of headache, sinus congestion, cough.  On the way to the chest x-ray, he syncopized.  Heart rate went down to the range of 30-40.  EKG showed sinus bradycardia. Lab work showed sodium of 132, creatinine of 1.58(baseline creatinine normal), lactic acid of 3.6 which has normalized after IV fluids was given.  WBC count is 12.3.  Chest x-ray did not show any acute findings.  CT denies any acute findings. Patient requested to be put on the observation for syncopal workup.  Currently on IV fluids.  May need PT evaluation and echocardiogram when he comes here.

## 2023-05-11 NOTE — H&P (Signed)
History and Physical    Patient: Jordan Calderon HQI:[id number] DOB: 11/30/1960 DOA: 05/11/2023 DOS: the patient was seen and examined on 05/11/2023 PCP: Pcp, No  Patient coming from: Home  Chief Complaint:  Chief Complaint  Patient presents with   Headache   URI   HPI: Jordan Calderon is a 62 y.o. male with medical history significant of HIV on HART, HLD.  CD4 count 132 in Oct.  Pt in to ED at [facility] with c/o headache, sinus congestion, cough.  Symptoms ongoing for ~5 days.  Not helped by advil.  No fever, chills, N/V/D, nor neck stiffness.  Pt with syncope on way to CXR with episode of sinus bradycardia in the 30-40 range.  CXR neg WBC 12.3k, no other SIRS Initial lactate of 3.6, cleared after IVF.   Review of Systems: As mentioned in the history of present illness. All other systems reviewed and are negative. Past Medical History:  Diagnosis Date   Anxiety    Cellulitis 02/11/2014   LEFT ARM   Depression    Headache(784.0)    HIV (human immunodeficiency virus infection) (HCC)    Past Surgical History:  Procedure Laterality Date   ARM HARDWARE REMOVAL Left    arm surgery     HEMORRHOID SURGERY     Social History:  reports that he quit smoking about 8 years ago. His smoking use included cigarettes. He started smoking about 38 years ago. He has a 3 pack-year smoking history. He has never used smokeless tobacco. He reports that he does not currently use drugs. He reports that he does not drink alcohol.  Allergies  Allergen Reactions   Sulfonamide Derivatives Itching    Family History  Adopted: Yes    Prior to Admission medications   Medication Sig Start Date End Date Taking? Authorizing Provider  dapsone 100 MG tablet Take 1 tablet (100 mg total) by mouth daily. Patient taking differently: Take 100 mg by mouth daily with breakfast. 01/17/23  Yes Comer, Belia Heman, MD  Darunavir-Cobicistat-Emtricitabine-Tenofovir Alafenamide [facility]) 800-150-200-10 MG TABS Take 1  tablet by mouth daily with breakfast. 01/17/23  Yes Comer, Belia Heman, MD  Pseudoephedrine-Ibuprofen (ADVIL COLD & SINUS LIQUI-GELS) 30-200 MG CAPS Take 2-3 capsules by mouth daily as needed.   Yes [provider]  rosuvastatin (CRESTOR) 10 MG tablet Take 1 tablet (10 mg total) by mouth daily. 04/19/23  Yes Gardiner Barefoot, MD    Physical Exam: Vitals:   05/11/23 1600 05/11/23 1730 05/11/23 1850 05/11/23 1916  BP: (!) 142/85 (!) 166/92 (!) 160/116 (!) 143/98  Pulse: 69 73 87 80  Resp: 16 18 18 20   Temp:   98.5 F (36.9 C) 97.8 F (36.6 C)  TempSrc:   Oral Oral  SpO2: 98% 98% 99% 96%  Weight:      Height:       Constitutional: NAD, calm, comfortable Respiratory: clear to auscultation bilaterally, no wheezing, no crackles. Normal respiratory effort. No accessory muscle use.  Cardiovascular: Regular rate and rhythm, no murmurs / rubs / gallops. No extremity edema. 2+ pedal pulses. No carotid bruits.  Abdomen: no tenderness, no masses palpated. No hepatosplenomegaly. Bowel sounds positive.  Neurologic: CN 2-12 grossly intact. Sensation intact, DTR normal. Strength 5/5 in all 4.  Psychiatric: Normal judgment and insight. Alert and oriented x 3. Normal mood.   Data Reviewed:    Labs on Admission: I have personally reviewed following labs and imaging studies  CBC: Recent Labs  Lab 05/11/23 1135  WBC  12.3*  NEUTROABS 6.7  HGB 13.0  HCT 37.7*  MCV 94.3  PLT 325   Basic Metabolic Panel: Recent Labs  Lab 05/11/23 1135 05/11/23 1331  NA 132*  --   K 3.7  --   CL 102  --   CO2 20*  --   GLUCOSE 155*  --   BUN 27*  --   CREATININE 1.58*  --   CALCIUM 9.1  --   MG  --  2.2   GFR: Estimated Creatinine Clearance: 47.6 mL/min (A) (by C-G formula based on SCr of 1.58 mg/dL (H)). Liver Function Tests: Recent Labs  Lab 05/11/23 1135  AST 35  ALT 33  ALKPHOS 111  BILITOT 1.2*  PROT 8.2*  ALBUMIN 4.1   No results for input(s): "LIPASE", "AMYLASE" in the last 168  hours. No results for input(s): "AMMONIA" in the last 168 hours. Coagulation Profile: No results for input(s): "INR", "PROTIME" in the last 168 hours. Cardiac Enzymes: No results for input(s): "CKTOTAL", "CKMB", "CKMBINDEX", "TROPONINI" in the last 168 hours. BNP (last 3 results) No results for input(s): "PROBNP" in the last 8760 hours. HbA1C: No results for input(s): "HGBA1C" in the last 72 hours. CBG: Recent Labs  Lab 05/11/23 1113  GLUCAP 161*   Lipid Profile: No results for input(s): "CHOL", "HDL", "LDLCALC", "TRIG", "CHOLHDL", "LDLDIRECT" in the last 72 hours. Thyroid Function Tests: Recent Labs    05/11/23 1136  TSH 7.784*   Anemia Panel: No results for input(s): "VITAMINB12", "FOLATE", "FERRITIN", "TIBC", "IRON", "RETICCTPCT" in the last 72 hours. Urine analysis:    Component Value Date/Time   COLORURINE YELLOW 07/01/2018 1032   APPEARANCEUR CLEAR 07/01/2018 1032   LABSPEC 1.010 07/01/2018 1032   PHURINE 6.5 07/01/2018 1032   GLUCOSEU NEGATIVE 07/01/2018 1032   HGBUR NEGATIVE 07/01/2018 1032   BILIRUBINUR NEGATIVE 07/01/2018 1032   KETONESUR NEGATIVE 07/01/2018 1032   PROTEINUR NEGATIVE 07/01/2018 1032   UROBILINOGEN 0.2 02/10/2014 1830   NITRITE NEGATIVE 07/01/2018 1032   LEUKOCYTESUR NEGATIVE 07/01/2018 1032    Radiological Exams on Admission: DG Chest Portable 1 View  Result Date: 05/11/2023 CLINICAL DATA:  Cough. EXAM: PORTABLE CHEST 1 VIEW COMPARISON:  07/01/2018. FINDINGS: Bilateral lung fields are clear. No consolidation or lung collapse. No pulmonary edema. Bilateral costophrenic angles are clear. Normal cardio-mediastinal silhouette. No acute osseous abnormalities. The soft tissues are within normal limits. IMPRESSION: *No active disease. Electronically Signed   By: Jules Schick M.D.   On: 05/11/2023 13:47   CT Head Wo Contrast  Result Date: 05/11/2023 CLINICAL DATA:  Headache, new onset (Age >= 51y) EXAM: CT HEAD WITHOUT CONTRAST TECHNIQUE:  Contiguous axial images were obtained from the base of the skull through the vertex without intravenous contrast. RADIATION DOSE REDUCTION: This exam was performed according to the departmental dose-optimization program which includes automated exposure control, adjustment of the mA and/or kV according to patient size and/or use of iterative reconstruction technique. COMPARISON:  None Available. FINDINGS: Brain: No evidence of acute infarction, hemorrhage, hydrocephalus, extra-axial collection or mass lesion/mass effect. Vascular: No hyperdense vessel or unexpected calcification. Skull: Normal. Negative for fracture or focal lesion. Sinuses/Orbits: No middle ear or mastoid effusion. Paranasal sinuses are notable for mucosal thickening of the floor of the right maxillary sinus. Orbits are unremarkable. Other: None. IMPRESSION: No acute intracranial abnormality. Electronically Signed   By: Lorenza Cambridge M.D.   On: 05/11/2023 12:33    EKG: Independently reviewed.   Assessment and Plan: * Syncope Syncope with bradycardia. Syncope  pathway Tele monitor 2d echo  AKI (acute kidney injury) (HCC) Pre-renal / ATN due to bradycardia induced hypotension? IVF Strict intake and output Repeat BMP in AM  Hypothyroidism TSH elevated today, checking T4 to confirm. Not helping bradycardia if indeed hypothyroidism is present Start synthroid if T4 confirms  URI (upper respiratory infection) Covid, flu, RSV neg CXR neg Check RVP  Human immunodeficiency virus (HIV) disease (HCC) Continue HAART Continue dapsone CD4 count 132 in Oct      Advance Care Planning:   Code Status: Full Code  Consults: None  Family Communication: No family in room  Severity of Illness: The appropriate patient status for this patient is OBSERVATION. Observation status is judged to be reasonable and necessary in order to provide the required intensity of service to ensure the patient's safety. The patient's presenting  symptoms, physical exam findings, and initial radiographic and laboratory data in the context of their medical condition is felt to place them at decreased risk for further clinical deterioration. Furthermore, it is anticipated that the patient will be medically stable for discharge from the hospital within 2 midnights of admission.   Author: Hillary Bow., DO 05/11/2023 8:32 PM  For on call review [email or web address].

## 2023-05-11 NOTE — ED Provider Notes (Signed)
[place] [facility] DE[facility] AT [facility] [place] Provider Note   CSN: [id number] Arrival date & time: 05/11/23  1012     History  No chief complaint on file.   Jordan Calderon is a 62 y.o. male.  The history is provided by the patient and medical records. No language interpreter was used.     62 year old male with a known history of HIV, polysubstance abuse, secondary syphilis, anxiety, hepatitis C, recurrent headache, presenting complaint of cold symptoms.  Patient states for the past 5 days he has had throbbing headache, sinus congestion, right ear pain, throat irritation, occasional cough and overall not feeling well.  He is taking Advil without adequate relief.  Does not endorse any fever or chills no nausea vomiting or diarrhea no chest pain no shortness of breath no neck stiffness no rash.  He is compliant with his medication, is viral load is undetectable and his CD4 count is 153 when last checked last month.  His ID specialist is Dr. Orvan Falconer.  He denies any hearing changes.  Home Medications Prior to Admission medications   Medication Sig Start Date End Date Taking? Authorizing Provider  dapsone 100 MG tablet Take 1 tablet (100 mg total) by mouth daily. 01/17/23   Gardiner Barefoot, MD  Darunavir-Cobicistat-Emtricitabine-Tenofovir Alafenamide [facility]) 800-150-200-10 MG TABS Take 1 tablet by mouth daily with breakfast. 01/17/23   Comer, Belia Heman, MD  rosuvastatin (CRESTOR) 10 MG tablet Take 1 tablet (10 mg total) by mouth daily. 04/19/23   Gardiner Barefoot, MD      Allergies    Sulfonamide derivatives    Review of Systems   Review of Systems  All other systems reviewed and are negative.   Physical Exam Updated Vital Signs BP 120/70 (BP Location: Right Arm)   Pulse (!) 52   Temp 97.7 F (36.5 C) (Oral)   Resp 18   Ht 5\' 9"  (1.753 m)   Wt 70.3 kg   SpO2 94%   BMI 22.89 kg/m  Physical Exam Vitals and nursing note reviewed.  Constitutional:      General:  He is not in acute distress.    Appearance: He is well-developed.  HENT:     Head: Atraumatic.     Ears:     Comments: Bilateral TMs with effusion but no erythema and nonbulging.  TMs are intact.  Normal ear canals.    Nose: Rhinorrhea present.     Mouth/Throat:     Comments: Mild posterior oropharyngeal erythema no tonsillar enlargement no exudate Eyes:     Conjunctiva/sclera: Conjunctivae normal.  Neck:     Vascular: No carotid bruit.  Cardiovascular:     Rate and Rhythm: Normal rate and regular rhythm.     Pulses: Normal pulses.     Heart sounds: Normal heart sounds.  Pulmonary:     Effort: Pulmonary effort is normal.     Breath sounds: Normal breath sounds. No wheezing, rhonchi or rales.  Abdominal:     Palpations: Abdomen is soft.  Musculoskeletal:     Cervical back: Normal range of motion and neck supple. No rigidity or tenderness.  Lymphadenopathy:     Cervical: No cervical adenopathy.  Skin:    Findings: No rash.  Neurological:     Mental Status: He is alert. Mental status is at baseline.  Psychiatric:        Mood and Affect: Mood normal.     ED Results / Procedures / Treatments   Labs (all labs ordered  are listed, but only abnormal results are displayed) Labs Reviewed  COMPREHENSIVE METABOLIC PANEL - Abnormal; Notable for the following components:      Result Value   Sodium 132 (*)    CO2 20 (*)    Glucose, Bld 155 (*)    BUN 27 (*)    Creatinine, Ser 1.58 (*)    Total Protein 8.2 (*)    Total Bilirubin 1.2 (*)    GFR, Estimated 49 (*)    All other components within normal limits  CBC WITH DIFFERENTIAL/PLATELET - Abnormal; Notable for the following components:   WBC 12.3 (*)    RBC 4.00 (*)    HCT 37.7 (*)    Monocytes Absolute 1.3 (*)    All other components within normal limits  LACTIC ACID, PLASMA - Abnormal; Notable for the following components:   Lactic Acid, Venous 3.6 (*)    All other components within normal limits  CBG MONITORING, ED -  Abnormal; Notable for the following components:   Glucose-Capillary 161 (*)    All other components within normal limits  TROPONIN I (HIGH SENSITIVITY) - Abnormal; Notable for the following components:   Troponin I (High Sensitivity) 27 (*)    All other components within normal limits  GROUP A STREP BY PCR  RESP PANEL BY RT-PCR (RSV, FLU A&B, COVID)  [id number]  TSH  LACTIC ACID, PLASMA  MAGNESIUM  TROPONIN I (HIGH SENSITIVITY)    EKG EKG Interpretation Date/Time:  Thursday May 11 2023 11:21:21 EST Ventricular Rate:  58 PR Interval:  195 QRS Duration:  91 QT Interval:  493 QTC Calculation: 485 R Axis:   3  Text Interpretation: duplicate Confirmed by Benjiman Core [id number]) on 05/11/2023 11:35:04 AM  Radiology DG Chest Portable 1 View  Result Date: 05/11/2023 CLINICAL DATA:  Cough. EXAM: PORTABLE CHEST 1 VIEW COMPARISON:  07/01/2018. FINDINGS: Bilateral lung fields are clear. No consolidation or lung collapse. No pulmonary edema. Bilateral costophrenic angles are clear. Normal cardio-mediastinal silhouette. No acute osseous abnormalities. The soft tissues are within normal limits. IMPRESSION: *No active disease. Electronically Signed   By: Jules Schick M.D.   On: 05/11/2023 13:47   CT Head Wo Contrast  Result Date: 05/11/2023 CLINICAL DATA:  Headache, new onset (Age >= 51y) EXAM: CT HEAD WITHOUT CONTRAST TECHNIQUE: Contiguous axial images were obtained from the base of the skull through the vertex without intravenous contrast. RADIATION DOSE REDUCTION: This exam was performed according to the departmental dose-optimization program which includes automated exposure control, adjustment of the mA and/or kV according to patient size and/or use of iterative reconstruction technique. COMPARISON:  None Available. FINDINGS: Brain: No evidence of acute infarction, hemorrhage, hydrocephalus, extra-axial collection or mass lesion/mass effect. Vascular: No hyperdense vessel or unexpected  calcification. Skull: Normal. Negative for fracture or focal lesion. Sinuses/Orbits: No middle ear or mastoid effusion. Paranasal sinuses are notable for mucosal thickening of the floor of the right maxillary sinus. Orbits are unremarkable. Other: None. IMPRESSION: No acute intracranial abnormality. Electronically Signed   By: Lorenza Cambridge M.D.   On: 05/11/2023 12:33    Procedures .Critical Care  Performed by: Fayrene Helper, PA-C Authorized by: Fayrene Helper, PA-C   Critical care provider statement:    Critical care time (minutes):  30   Critical care was time spent personally by me on the following activities:  Development of treatment plan with patient or surrogate, discussions with consultants, evaluation of patient's response to treatment, examination of patient, ordering and review of laboratory studies,  ordering and review of radiographic studies, ordering and performing treatments and interventions, pulse oximetry, re-evaluation of patient's condition and review of old charts      Medications Ordered in ED Medications  benzonatate (TESSALON) capsule 100 mg (100 mg Oral Given 05/11/23 1043)    ED Course/ Medical Decision Making/ A&P Clinical Course as of 05/11/23 1500  Thu May 11, 2023  1448 URI, headache, brady and syncope here, sinus brady, getting admitted [JD]    Clinical Course User Index [JD] Laurence Spates, MD                                 Medical Decision Making Amount and/or Complexity of Data Reviewed Labs: ordered. Radiology: ordered.  Risk Prescription drug management. Decision regarding hospitalization.   BP 120/70 (BP Location: Right Arm)   Pulse (!) 52   Temp 97.7 F (36.5 C) (Oral)   Resp 18   Ht 5\' 9"  (1.753 m)   Wt 70.3 kg   SpO2 94%   BMI 22.89 kg/m   98:49 AM  62 year old male with a known history of HIV, polysubstance abuse, secondary syphilis, anxiety, hepatitis C, recurrent headache, presenting complaint of cold symptoms.  Patient  states for the past 5 days he has had throbbing headache, sinus congestion, right ear pain, throat irritation, occasional cough and overall not feeling well.  He is taking Advil without adequate relief.  Does not endorse any fever or chills no nausea vomiting or diarrhea no chest pain no shortness of breath no neck stiffness no rash.  He is compliant with his medication, is viral load is undetectable and his CD4 count is 153 when last checked last month.  His ID specialist is Dr. Orvan Falconer.  He denies any hearing changes.  Exam overall reassuring, no significant TM erythema although throat exam is mildly erythematous no obvious PTA signs of deep tissue infection.  He has normal phonation and tolerates description without difficulty.  He does have some sinus congestion.  His lungs are clear.  Given history of HIV, will obtain respiratory panel, strep test, as well as chest x-ray for further assessment.  11:22 AM During exam, patient was coughing.  Patient went to have a chest x-ray done and he was found to be lethargic and subsequently had a syncopal episode.  He was found to be very bradycardic with heart rate in the 30s, EKG obtained showing sinus bradycardia.  Patient was easily arousable with improvement of his heart rate.  IV fluids given, patient placed on monitor for close monitoring.  Suspect a vasovagal episode however patient warrant admission for further workup. Care discussed with DR. Pickering.  The patient was placed on Zoll Pads, and necessary airway equipment in the room in anticipation of management of symptomatic bradycardia  12:17 PM Elevated lactic acid of 3.6 likely 2/2 syncope and not sepsis.  Pt is afebrile, no hypotension or tachycardia.  Repeat lactic acid is 1.4  -Labs ordered, independently viewed and interpreted by me.  Labs remarkable for trop 27, likely demand ischemia.  No active chest pain suggestive of ACS.  Cr 1.58, IVF given.  WBC 12.3.  Hgb 13.  Negative covid/flu/rsv.   Strep negative -The patient was maintained on a cardiac monitor.  I personally viewed and interpreted the cardiac monitored which showed an underlying rhythm of: sinus brady -Imaging independently viewed and interpreted by me and I agree with radiologist's interpretation.  Result remarkable for  CXR unremarkable, head CT reassuring -This patient presents to the ED for concern of cold sxs, this involves an extensive number of treatment options, and is a complaint that carries with it a high risk of complications and morbidity.  The differential diagnosis includes covid, flu, rsv, pneumonia, viral illness, meningitis -Co morbidities that complicate the patient evaluation includes HIV -Treatment includes tessalon, IVF -Reevaluation of the patient after these medicines showed that the patient improved -PCP office notes or outside notes reviewed -Discussion with attending Dr. Rubin Payor.  I have also consulted with [facility] Dr. Elmer Sow who agrees to admit pt for further evaluation of his syncope -Escalation to admission/observation considered: patient is agreeable with admission        Final Clinical Impression(s) / ED Diagnoses Final diagnoses:  Syncope and collapse  Bradycardia  Viral illness    Rx / DC Orders ED Discharge Orders     None         Fayrene Helper, PA-C 05/11/23 1502    Benjiman Core, MD 05/16/23 1440

## 2023-05-11 NOTE — Assessment & Plan Note (Signed)
Pre-renal / ATN due to bradycardia induced hypotension? IVF Strict intake and output Repeat BMP in AM

## 2023-05-11 NOTE — Assessment & Plan Note (Addendum)
Continue HAART Continue dapsone CD4 count 132 in Oct

## 2023-05-11 NOTE — ED Notes (Addendum)
XR staff called ER staff due to a patient syncopal episode in the XR room. I arrived to find the pt supine with staff around him, breathing shallowly and staring off into the distance. The pt was controlling his own airway, but was pale gray, and clammy to the touch. The pt had a weak radial and thready, slow carotid pulses. His shirt was cut off due to the emergent nature of his condition. Pads applied due to concern for impending cardiac arrest. He was alert to painful stimuli but was nonverbal. Multiple sternal rubs were used to get the pt to take a deep breath, but he was only able to follow simple commands like squeezing fingers.   After the pads were applied and he was found in a bradycardic rhythm, the pt was picked up with 6 personnel and a draw sheet. The pt was placed on a stretcher and moved back to his room. IV's established and EDPs were at bedside. Pt was verbally responsive and coherent, CAOx4. The pt was obviously fatigued, but his color was improving now that he was supine, and he was on the monitor. Treatments as shown.

## 2023-05-11 NOTE — Assessment & Plan Note (Signed)
TSH elevated today, checking T4 to confirm. Not helping bradycardia if indeed hypothyroidism is present Start synthroid if T4 confirms

## 2023-05-11 NOTE — ED Triage Notes (Signed)
Friday started having headache, now progressed to right ear pain and sore throat, cough.

## 2023-05-11 NOTE — Assessment & Plan Note (Signed)
Syncope with bradycardia. Syncope pathway Tele monitor 2d echo

## 2023-05-11 NOTE — Assessment & Plan Note (Addendum)
Covid, flu, RSV neg CXR neg Check RVP

## 2023-05-12 ENCOUNTER — Observation Stay (HOSPITAL_BASED_OUTPATIENT_CLINIC_OR_DEPARTMENT_OTHER): Payer: Medicare Other

## 2023-05-12 DIAGNOSIS — R55 Syncope and collapse: Secondary | ICD-10-CM | POA: Diagnosis not present

## 2023-05-12 LAB — ECHOCARDIOGRAM COMPLETE
AR max vel: 3.96 cm2
AV Area VTI: 4.09 cm2
AV Area mean vel: 3.8 cm2
AV Mean grad: 4 mm[Hg]
AV Peak grad: 8.3 mm[Hg]
Ao pk vel: 1.44 m/s
Area-P 1/2: 2.33 cm2
Height: 69 in
S' Lateral: 3.1 cm
Weight: 2473.74 [oz_av]

## 2023-05-12 LAB — RESPIRATORY PANEL BY PCR

## 2023-05-12 LAB — GLUCOSE, CAPILLARY: Glucose-Capillary: 89 mg/dL (ref 70–99)

## 2023-05-12 LAB — BASIC METABOLIC PANEL
Anion gap: 6 (ref 5–15)
BUN: 25 mg/dL — ABNORMAL HIGH (ref 8–23)
CO2: 22 mmol/L (ref 22–32)
Calcium: 8.6 mg/dL — ABNORMAL LOW (ref 8.9–10.3)
Chloride: 104 mmol/L (ref 98–111)
Creatinine, Ser: 1.12 mg/dL (ref 0.61–1.24)
GFR, Estimated: >60 mL/min (ref >60)
Glucose, Bld: 88 mg/dL (ref 70–99)
Potassium: 4.1 mmol/L (ref 3.5–5.1)
Sodium: 132 mmol/L — ABNORMAL LOW (ref 135–145)

## 2023-05-12 LAB — CBC
HCT: 35 % — ABNORMAL LOW (ref 39.0–52.0)
Hemoglobin: 11.7 g/dL — ABNORMAL LOW (ref 13.0–17.0)
MCH: 32.3 pg (ref 26.0–34.0)
MCHC: 33.4 g/dL (ref 30.0–36.0)
MCV: 96.7 fL (ref 80.0–100.0)
Platelets: 177 10*3/uL (ref 150–400)
RBC: 3.62 MIL/uL — ABNORMAL LOW (ref 4.22–5.81)
RDW: 12.7 % (ref 11.5–15.5)
WBC: 9.4 10*3/uL (ref 4.0–10.5)
nRBC: 0 % (ref 0.0–0.2)

## 2023-05-12 MED ORDER — BUTALBITAL-APAP-CAFFEINE 50-325-40 MG PO TABS
2.0000 | ORAL_TABLET | Freq: Once | ORAL | Status: AC
  Administered 2023-05-12: 2 via ORAL
  Filled 2023-05-12: qty 2

## 2023-05-12 NOTE — Progress Notes (Signed)
Pt had a 5 beat run of vtach. BP is 139/90, HR 80 with frequent PVCs. Dr Julian Reil made aware. Will continue to monitor.

## 2023-05-12 NOTE — Progress Notes (Signed)
Pt being d/c, VSS, IV removed, Education complete.   Balinda Quails, RN 05/12/2023 4:56 PM

## 2023-05-12 NOTE — Progress Notes (Signed)
Mobility Specialist Progress Note:   05/12/23 1500  Mobility  Activity Ambulated independently in hallway  Level of Assistance Standby assist, set-up cues, supervision of patient - no hands on  Assistive Device None  Distance Ambulated (ft) 400 ft  Activity Response Tolerated well  Mobility Referral Yes  $Mobility charge 1 Mobility  Mobility Specialist Start Time (ACUTE ONLY) 1547  Mobility Specialist Stop Time (ACUTE ONLY) 1552  Mobility Specialist Time Calculation (min) (ACUTE ONLY) 5 min    Received pt in bed having no complaints and agreeable to mobility. Pt was asymptomatic throughout ambulation and returned to room w/o fault. Left in bed w/ call bell in reach and all needs met.   D'Vante Earlene Plater Mobility Specialist Please contact via Special educational needs teacher or Rehab office at [phone]

## 2023-05-12 NOTE — Plan of Care (Signed)
  Problem: Education: Goal: Knowledge of General Education information will improve Description: Including pain rating scale, medication(s)/side effects and non-pharmacologic comfort measures Outcome: Progressing   Problem: Health Behavior/Discharge Planning: Goal: Ability to manage health-related needs will improve Outcome: Progressing   Problem: Clinical Measurements: Goal: Ability to maintain clinical measurements within normal limits will improve Outcome: Progressing Goal: Will remain free from infection Outcome: Progressing Goal: Diagnostic test results will improve Outcome: Progressing Goal: Respiratory complications will improve Outcome: Progressing Goal: Cardiovascular complication will be avoided Outcome: Progressing   Problem: Activity: Goal: Risk for activity intolerance will decrease Outcome: Progressing   Problem: Nutrition: Goal: Adequate nutrition will be maintained Outcome: Progressing   Problem: Coping: Goal: Level of anxiety will decrease Outcome: Progressing   Problem: Elimination: Goal: Will not experience complications related to bowel motility Outcome: Progressing Goal: Will not experience complications related to urinary retention Outcome: Progressing   Problem: Pain Management: Goal: General experience of comfort will improve Outcome: Progressing   Problem: Safety: Goal: Ability to remain free from injury will improve Outcome: Progressing   Problem: Skin Integrity: Goal: Risk for impaired skin integrity will decrease Outcome: Progressing   Problem: Education: Goal: Knowledge of condition and prescribed therapy will improve Outcome: Progressing   Problem: Cardiac: Goal: Will achieve and/or maintain adequate cardiac output Outcome: Progressing   Problem: Physical Regulation: Goal: Complications related to the disease process, condition or treatment will be avoided or minimized Outcome: Progressing

## 2023-05-12 NOTE — Discharge Summary (Signed)
DISCHARGE SUMMARY  Jordan Calderon  MR#: [id number]  DOB:[birth year]  Date of Admission: 05/11/2023 Date of Discharge: 05/12/2023  Attending Physician:Skarlett Sedlacek Silvestre Gunner, MD  Patient's PCP:Pcp, No  Disposition: D/C home   Follow-up Appts:  Follow-up Information     Your Primary Care Provider Follow up.   Why: See your primary care provider for a routine check in 7-10 days.                Discharge Diagnoses: Single syncopal spell Acute kidney injury URI Hypothyroidism HIV  Initial presentation: 62 year old with a history of HIV on HAART, HLD, seondary syphillis, polysubstance abuse, and depression who presented to the ER at [facility] with complaints of headache sinus congestion and cough for approximately 5 days with no fevers chills or neck stiffness.  He suffered an episode of syncope during his ER evaluation.   Hospital Course:  Single syncopal spell TTE accomplished during this admit w/ results pending at d/c - no orthostasis after volume resuscitation - likely simple volume related orthostatic episode in setting of DH due to days of poor intake in setting of URI - no further sx during hospital stay    Acute kidney injury Likely simply due to prerenal state versus early ATN in setting of poor oral intake -resolved with simple volume expansion   URI COVID influenza and RSV negative -CXR without evidence of focal infiltrate/pneumonia   Hypothyroidism TSH elevated at presentation - Free T4 normal   HIV Continue HAART and his usual dapsone -CD4 count 132 in October 2024  Allergies as of 05/12/2023       Reactions   Sulfonamide Derivatives Itching        Medication List     TAKE these medications    Advil Cold & Sinus Liqui-Gels 30-200 MG Caps Generic drug: Pseudoephedrine-Ibuprofen Take 2-3 capsules by mouth daily as needed.   dapsone 100 MG tablet Take 1 tablet (100 mg total) by mouth daily. What changed: when to take this    Darunavir-Cobicistat-Emtricitabine-Tenofovir Alafenamide 800-150-200-10 MG Tabs Commonly known as: SYMTUZA Take 1 tablet by mouth daily with breakfast.   rosuvastatin 10 MG tablet Commonly known as: CRESTOR Take 1 tablet (10 mg total) by mouth daily.        Day of Discharge BP 131/89 (BP Location: Right Arm)   Pulse 72   Temp 98.2 F (36.8 C) (Oral)   Resp 16   Ht 5\' 9"  (1.753 m)   Wt 70.1 kg   SpO2 93%   BMI 22.83 kg/m   Physical Exam: General: No acute respiratory distress Lungs: Clear to auscultation bilaterally without wheezes or crackles Cardiovascular: Regular rate and rhythm without murmur gallop or rub normal S1 and S2 Abdomen: Nontender, nondistended, soft, bowel sounds positive, no rebound, no ascites, no appreciable mass Extremities: No significant cyanosis, clubbing, or edema bilateral lower extremities  Basic Metabolic Panel: Recent Labs  Lab 05/11/23 1135 05/11/23 1331 05/12/23 0627  NA 132*  --  132*  K 3.7  --  4.1  CL 102  --  104  CO2 20*  --  22  GLUCOSE 155*  --  88  BUN 27*  --  25*  CREATININE 1.58*  --  1.12  CALCIUM 9.1  --  8.6*  MG  --  2.2  --     CBC: Recent Labs  Lab 05/11/23 1135 05/12/23 0627  WBC 12.3* 9.4  NEUTROABS 6.7  --   HGB 13.0 11.7*  HCT 37.7* 35.0*  MCV 94.3 96.7  PLT 325 177    Time spent in discharge (includes decision making & examination of pt): 30 minutes  05/12/2023, 4:44 PM   Lonia Blood, MD [facility] Office  [phone]

## 2023-05-12 NOTE — Care Management (Signed)
  Transition of Care [facility]) Screening Note   Patient Details  Name: MAVERYCK ALLY Date of Birth: [birth year]   Transition of Care [facility]) CM/SW Contact:    Lockie Pares, RN Phone Number: 05/12/2023, 9:29 AM    Transition of Care Department [facility]) has reviewed patient and no TOC needs have been identified at this time. We will continue to monitor patient advancement through interdisciplinary progression rounds. If new patient transition needs arise, please place a TOC consult.

## 2023-05-17 ENCOUNTER — Inpatient Hospital Stay (HOSPITAL_BASED_OUTPATIENT_CLINIC_OR_DEPARTMENT_OTHER)
Admission: EM | Admit: 2023-05-17 | Discharge: 2023-05-24 | DRG: 146 | Disposition: A | Discharge status: Home Health | Payer: Medicare Other | Attending: Internal Medicine | Admitting: Internal Medicine

## 2023-05-17 ENCOUNTER — Encounter (HOSPITAL_BASED_OUTPATIENT_CLINIC_OR_DEPARTMENT_OTHER): Payer: Self-pay

## 2023-05-17 ENCOUNTER — Inpatient Hospital Stay (HOSPITAL_COMMUNITY): Payer: Medicare Other

## 2023-05-17 ENCOUNTER — Emergency Department (HOSPITAL_BASED_OUTPATIENT_CLINIC_OR_DEPARTMENT_OTHER): Payer: Medicare Other

## 2023-05-17 ENCOUNTER — Other Ambulatory Visit: Payer: Self-pay

## 2023-05-17 DIAGNOSIS — C8599 Non-Hodgkin lymphoma, unspecified, extranodal and solid organ sites: Secondary | ICD-10-CM | POA: Diagnosis not present

## 2023-05-17 DIAGNOSIS — Z882 Allergy status to sulfonamides status: Secondary | ICD-10-CM

## 2023-05-17 DIAGNOSIS — K3189 Other diseases of stomach and duodenum: Secondary | ICD-10-CM | POA: Diagnosis present

## 2023-05-17 DIAGNOSIS — K449 Diaphragmatic hernia without obstruction or gangrene: Secondary | ICD-10-CM | POA: Diagnosis present

## 2023-05-17 DIAGNOSIS — K2270 Barrett's esophagus without dysplasia: Secondary | ICD-10-CM | POA: Diagnosis present

## 2023-05-17 DIAGNOSIS — Z792 Long term (current) use of antibiotics: Secondary | ICD-10-CM | POA: Diagnosis not present

## 2023-05-17 DIAGNOSIS — K921 Melena: Secondary | ICD-10-CM | POA: Diagnosis not present

## 2023-05-17 DIAGNOSIS — Z21 Asymptomatic human immunodeficiency virus [HIV] infection status: Secondary | ICD-10-CM | POA: Diagnosis not present

## 2023-05-17 DIAGNOSIS — G4700 Insomnia, unspecified: Secondary | ICD-10-CM | POA: Diagnosis not present

## 2023-05-17 DIAGNOSIS — C119 Malignant neoplasm of nasopharynx, unspecified: Secondary | ICD-10-CM | POA: Diagnosis present

## 2023-05-17 DIAGNOSIS — K219 Gastro-esophageal reflux disease without esophagitis: Secondary | ICD-10-CM | POA: Diagnosis present

## 2023-05-17 DIAGNOSIS — H02401 Unspecified ptosis of right eyelid: Secondary | ICD-10-CM | POA: Diagnosis present

## 2023-05-17 DIAGNOSIS — J343 Hypertrophy of nasal turbinates: Secondary | ICD-10-CM | POA: Diagnosis present

## 2023-05-17 DIAGNOSIS — B3781 Candidal esophagitis: Secondary | ICD-10-CM | POA: Diagnosis present

## 2023-05-17 DIAGNOSIS — K250 Acute gastric ulcer with hemorrhage: Secondary | ICD-10-CM | POA: Diagnosis present

## 2023-05-17 DIAGNOSIS — D106 Benign neoplasm of nasopharynx: Secondary | ICD-10-CM | POA: Diagnosis not present

## 2023-05-17 DIAGNOSIS — C787 Secondary malignant neoplasm of liver and intrahepatic bile duct: Secondary | ICD-10-CM | POA: Diagnosis present

## 2023-05-17 DIAGNOSIS — Z79890 Hormone replacement therapy (postmenopausal): Secondary | ICD-10-CM | POA: Diagnosis not present

## 2023-05-17 DIAGNOSIS — B20 Human immunodeficiency virus [HIV] disease: Secondary | ICD-10-CM | POA: Diagnosis present

## 2023-05-17 DIAGNOSIS — E039 Hypothyroidism, unspecified: Secondary | ICD-10-CM | POA: Diagnosis present

## 2023-05-17 DIAGNOSIS — K209 Esophagitis, unspecified without bleeding: Secondary | ICD-10-CM

## 2023-05-17 DIAGNOSIS — Z79899 Other long term (current) drug therapy: Secondary | ICD-10-CM

## 2023-05-17 DIAGNOSIS — Z8673 Personal history of transient ischemic attack (TIA), and cerebral infarction without residual deficits: Secondary | ICD-10-CM | POA: Diagnosis not present

## 2023-05-17 DIAGNOSIS — G523 Disorders of hypoglossal nerve: Secondary | ICD-10-CM | POA: Diagnosis present

## 2023-05-17 DIAGNOSIS — D649 Anemia, unspecified: Secondary | ICD-10-CM | POA: Diagnosis not present

## 2023-05-17 DIAGNOSIS — K259 Gastric ulcer, unspecified as acute or chronic, without hemorrhage or perforation: Secondary | ICD-10-CM | POA: Diagnosis not present

## 2023-05-17 DIAGNOSIS — J342 Deviated nasal septum: Secondary | ICD-10-CM | POA: Diagnosis present

## 2023-05-17 DIAGNOSIS — J310 Chronic rhinitis: Secondary | ICD-10-CM | POA: Diagnosis present

## 2023-05-17 DIAGNOSIS — E785 Hyperlipidemia, unspecified: Secondary | ICD-10-CM | POA: Diagnosis present

## 2023-05-17 DIAGNOSIS — D62 Acute posthemorrhagic anemia: Secondary | ICD-10-CM | POA: Diagnosis present

## 2023-05-17 DIAGNOSIS — Z87891 Personal history of nicotine dependence: Secondary | ICD-10-CM | POA: Diagnosis not present

## 2023-05-17 DIAGNOSIS — R42 Dizziness and giddiness: Principal | ICD-10-CM

## 2023-05-17 DIAGNOSIS — J392 Other diseases of pharynx: Secondary | ICD-10-CM | POA: Insufficient documentation

## 2023-05-17 DIAGNOSIS — R0981 Nasal congestion: Secondary | ICD-10-CM | POA: Diagnosis not present

## 2023-05-17 LAB — CBC
HCT: 30.3 % — ABNORMAL LOW (ref 39.0–52.0)
HCT: 28.9 % — ABNORMAL LOW (ref 39.0–52.0)
Hemoglobin: 10.2 g/dL — ABNORMAL LOW (ref 13.0–17.0)
Hemoglobin: 9.8 g/dL — ABNORMAL LOW (ref 13.0–17.0)
MCH: 32.4 pg (ref 26.0–34.0)
MCH: 32.2 pg (ref 26.0–34.0)
MCHC: 33.7 g/dL (ref 30.0–36.0)
MCHC: 33.9 g/dL (ref 30.0–36.0)
MCV: 96.2 fL (ref 80.0–100.0)
MCV: 95.1 fL (ref 80.0–100.0)
Platelets: 261 10*3/uL (ref 150–400)
Platelets: 250 10*3/uL (ref 150–400)
RBC: 3.15 MIL/uL — ABNORMAL LOW (ref 4.22–5.81)
RBC: 3.04 MIL/uL — ABNORMAL LOW (ref 4.22–5.81)
RDW: 12.9 % (ref 11.5–15.5)
RDW: 12.8 % (ref 11.5–15.5)
WBC: 8.7 10*3/uL (ref 4.0–10.5)
WBC: 9.6 10*3/uL (ref 4.0–10.5)
nRBC: 0 % (ref 0.0–0.2)
nRBC: 0 % (ref 0.0–0.2)

## 2023-05-17 LAB — BASIC METABOLIC PANEL
Anion gap: 11 (ref 5–15)
BUN: 20 mg/dL (ref 8–23)
CO2: 25 mmol/L (ref 22–32)
Calcium: 9.9 mg/dL (ref 8.9–10.3)
Chloride: 99 mmol/L (ref 98–111)
Creatinine, Ser: 1.17 mg/dL (ref 0.61–1.24)
GFR, Estimated: >60 mL/min (ref >60)
Glucose, Bld: 110 mg/dL — ABNORMAL HIGH (ref 70–99)
Potassium: 4.3 mmol/L (ref 3.5–5.1)
Sodium: 135 mmol/L (ref 135–145)

## 2023-05-17 LAB — HEMOGLOBIN A1C
Hgb A1c MFr Bld: 3.5 % — ABNORMAL LOW (ref 4.8–5.6)
Mean Plasma Glucose: 53.75 mg/dL

## 2023-05-17 LAB — TROPONIN I (HIGH SENSITIVITY)
Troponin I (High Sensitivity): 13 ng/L (ref <18)
Troponin I (High Sensitivity): 9 ng/L (ref <18)

## 2023-05-17 LAB — CREATININE, SERUM
Creatinine, Ser: 1.15 mg/dL (ref 0.61–1.24)
GFR, Estimated: >60 mL/min (ref >60)

## 2023-05-17 MED ORDER — SODIUM CHLORIDE 0.9 % IV SOLN
12.5000 mg | Freq: Once | INTRAVENOUS | Status: AC
  Administered 2023-05-17: 12.5 mg via INTRAVENOUS
  Filled 2023-05-17: qty 0.5

## 2023-05-17 MED ORDER — IBUPROFEN 200 MG PO TABS
200.0000 mg | ORAL_TABLET | Freq: Four times a day (QID) | ORAL | Status: DC | PRN
  Administered 2023-05-18: 200 mg via ORAL
  Administered 2023-05-18: 400 mg via ORAL
  Filled 2023-05-17 (×2): qty 2

## 2023-05-17 MED ORDER — SENNOSIDES-DOCUSATE SODIUM 8.6-50 MG PO TABS
1.0000 | ORAL_TABLET | Freq: Every evening | ORAL | Status: DC | PRN
Start: 2023-05-17 — End: 2023-05-24
  Administered 2023-05-21 – 2023-05-22 (×3): 1 via ORAL
  Filled 2023-05-17 (×3): qty 1

## 2023-05-17 MED ORDER — CALCIUM CARBONATE ANTACID 500 MG PO CHEW
1.0000 | CHEWABLE_TABLET | Freq: Three times a day (TID) | ORAL | Status: DC
  Administered 2023-05-18 – 2023-05-24 (×16): 200 mg via ORAL
  Filled 2023-05-17 (×17): qty 1

## 2023-05-17 MED ORDER — ACETAMINOPHEN 325 MG PO TABS: 650.0000 mg | ORAL_TABLET | ORAL | Status: DC | PRN

## 2023-05-17 MED ORDER — DARUN-COBIC-EMTRICIT-TENOFAF 800-150-200-10 MG PO TABS
1.0000 | ORAL_TABLET | Freq: Every day | ORAL | Status: DC
Start: 2023-05-18 — End: 2023-05-24
  Administered 2023-05-18 – 2023-05-24 (×7): 1 via ORAL
  Filled 2023-05-17 (×7): qty 1

## 2023-05-17 MED ORDER — DAPSONE 100 MG PO TABS
100.0000 mg | ORAL_TABLET | Freq: Every day | ORAL | Status: DC
  Administered 2023-05-18 – 2023-05-24 (×7): 100 mg via ORAL
  Filled 2023-05-17 (×7): qty 1

## 2023-05-17 MED ORDER — ACETAMINOPHEN 650 MG RE SUPP: 650.0000 mg | RECTAL | Status: DC | PRN

## 2023-05-17 MED ORDER — ENOXAPARIN SODIUM 40 MG/0.4ML IJ SOSY
40.0000 mg | PREFILLED_SYRINGE | INTRAMUSCULAR | Status: DC
Start: 2023-05-17 — End: 2023-05-21
  Administered 2023-05-17 – 2023-05-20 (×4): 40 mg via SUBCUTANEOUS
  Filled 2023-05-17 (×4): qty 0.4

## 2023-05-17 MED ORDER — MECLIZINE HCL 25 MG PO TABS
25.0000 mg | ORAL_TABLET | Freq: Once | ORAL | Status: AC
  Administered 2023-05-17: 25 mg via ORAL
  Filled 2023-05-17: qty 1

## 2023-05-17 MED ORDER — MORPHINE SULFATE (PF) 2 MG/ML IV SOLN: 2.0000 mg | INTRAVENOUS | Status: DC | PRN

## 2023-05-17 MED ORDER — SODIUM CHLORIDE 0.9 % IV BOLUS
1000.0000 mL | Freq: Once | INTRAVENOUS | Status: AC
  Administered 2023-05-17: 1000 mL via INTRAVENOUS

## 2023-05-17 MED ORDER — DEXTROSE-SODIUM CHLORIDE 5-0.9 % IV SOLN: INTRAVENOUS | Status: AC

## 2023-05-17 MED ORDER — ENOXAPARIN SODIUM 40 MG/0.4ML IJ SOSY: 40.0000 mg | PREFILLED_SYRINGE | INTRAMUSCULAR | Status: DC

## 2023-05-17 MED ORDER — ACETAMINOPHEN 160 MG/5ML PO SOLN: 650.0000 mg | ORAL | Status: DC | PRN

## 2023-05-17 MED ORDER — ACETAMINOPHEN 650 MG RE SUPP: 650.0000 mg | Freq: Four times a day (QID) | RECTAL | Status: DC | PRN

## 2023-05-17 MED ORDER — MECLIZINE HCL 25 MG PO TABS
25.0000 mg | ORAL_TABLET | Freq: Three times a day (TID) | ORAL | Status: DC | PRN
  Filled 2023-05-17: qty 1

## 2023-05-17 MED ORDER — PROMETHAZINE HCL 25 MG/ML IJ SOLN
INTRAMUSCULAR | Status: AC
  Filled 2023-05-17: qty 1

## 2023-05-17 MED ORDER — ONDANSETRON HCL 4 MG/2ML IJ SOLN
4.0000 mg | Freq: Four times a day (QID) | INTRAMUSCULAR | Status: DC | PRN
  Administered 2023-05-18 – 2023-05-19 (×2): 4 mg via INTRAVENOUS
  Filled 2023-05-17: qty 2

## 2023-05-17 MED ORDER — ONDANSETRON HCL 4 MG PO TABS: 4.0000 mg | ORAL_TABLET | Freq: Four times a day (QID) | ORAL | Status: DC | PRN

## 2023-05-17 MED ORDER — DIAZEPAM 5 MG/ML IJ SOLN
2.5000 mg | Freq: Once | INTRAMUSCULAR | Status: AC
  Administered 2023-05-17: 2.5 mg via INTRAVENOUS
  Filled 2023-05-17: qty 2

## 2023-05-17 MED ORDER — IOHEXOL 350 MG/ML SOLN
100.0000 mL | Freq: Once | INTRAVENOUS | Status: AC | PRN
  Administered 2023-05-17: 75 mL via INTRAVENOUS

## 2023-05-17 MED ORDER — ACETAMINOPHEN 325 MG PO TABS
650.0000 mg | ORAL_TABLET | Freq: Four times a day (QID) | ORAL | Status: DC | PRN
  Administered 2023-05-23: 650 mg via ORAL
  Filled 2023-05-17: qty 2

## 2023-05-17 MED ORDER — ONDANSETRON HCL 4 MG/2ML IJ SOLN
4.0000 mg | Freq: Once | INTRAMUSCULAR | Status: AC
Start: 2023-05-17 — End: 2023-05-17
  Administered 2023-05-17: 4 mg via INTRAVENOUS
  Filled 2023-05-17: qty 2

## 2023-05-17 NOTE — H&P (Signed)
History and Physical    Patient: Jordan Calderon UJW:[id number] DOB: 1961-05-24 DOA: 05/17/2023 DOS: the patient was seen and examined on 05/17/2023 PCP: Pcp, No  Patient coming from: Home  Chief Complaint:  Chief Complaint  Patient presents with   Otalgia   Dizziness   HPI: CAMERON CHREST is a 62 y.o. male with medical history significant of HIV disease, depression, recent admission with syncopal episode with AKI, hypothyroidism and positive drug infection.  During that time patient was evaluated including TTE and aggressive hydration.  He had a head CT without contrast at the time that did not show any abnormal findings.  Since discharge patient has not been himself continues to be weak dizzy having more headaches.  Return to the ER today where he is seen and evaluated.  CT angiogram of the head and neck today shows a large right nasopharyngeal skull base mass which was suboptimally evaluated but is worrisome for nasopharyngeal carcinoma.  Chest x-ray also negative.  Patient is having vertigo from that.  Patient admitted for evaluation and involvement of ENT.  Review of Systems: As mentioned in the history of present illness. All other systems reviewed and are negative. Past Medical History:  Diagnosis Date   Anxiety    Cellulitis 02/11/2014   LEFT ARM   Depression    Headache(784.0)    HIV (human immunodeficiency virus infection) (HCC)    Past Surgical History:  Procedure Laterality Date   ARM HARDWARE REMOVAL Left    arm surgery     HEMORRHOID SURGERY     Social History:  reports that he quit smoking about 8 years ago. His smoking use included cigarettes. He started smoking about 38 years ago. He has a 3 pack-year smoking history. He has never used smokeless tobacco. He reports that he does not currently use drugs. He reports that he does not drink alcohol.  Allergies  Allergen Reactions   Sulfonamide Derivatives Hives and Itching    Family History  Adopted: Yes     Prior to Admission medications   Medication Sig Start Date End Date Taking? Authorizing Provider  calcium carbonate (TUMS - DOSED IN MG ELEMENTAL CALCIUM) 500 MG chewable tablet Chew 1 tablet by mouth 3 (three) times daily with meals.   Yes [provider]  dapsone 100 MG tablet Take 1 tablet (100 mg total) by mouth daily. Patient taking differently: Take 100 mg by mouth daily with breakfast. 01/17/23  Yes Comer, Belia Heman, MD  Darunavir-Cobicistat-Emtricitabine-Tenofovir Alafenamide [facility]) 800-150-200-10 MG TABS Take 1 tablet by mouth daily with breakfast. 01/17/23  Yes Comer, Belia Heman, MD  ibuprofen (ADVIL) 200 MG tablet Take 200-400 mg by mouth every 6 (six) hours as needed for mild pain (pain score 1-3).   Yes [provider]  rosuvastatin (CRESTOR) 10 MG tablet Take 1 tablet (10 mg total) by mouth daily. Patient not taking: Reported on 05/17/2023 04/19/23   Gardiner Barefoot, MD    Physical Exam: Vitals:   05/17/23 1500 05/17/23 1530 05/17/23 1600 05/17/23 1736  BP: 124/74 (!) 120/99 115/86 122/87  Pulse: 70 80 73 82  Resp: 16 15 16 18   Temp:    98.4 F (36.9 C)  TempSrc:    Oral  SpO2: 93% 98% 95% 96%  Weight:    72.5 kg  Height:    5\' 9"  (1.753 m)   Constitutional: NAD, calm, comfortable Eyes: PERRL, lids and conjunctivae normal ENMT: Mucous membranes are moist. Posterior pharynx clear of any exudate or  lesions.Normal dentition.  Neck: normal, supple, no masses, no thyromegaly Respiratory: clear to auscultation bilaterally, no wheezing, no crackles. Normal respiratory effort. No accessory muscle use.  Cardiovascular: Regular rate and rhythm, no murmurs / rubs / gallops. No extremity edema. 2+ pedal pulses. No carotid bruits.  Abdomen: no tenderness, no masses palpated. No hepatosplenomegaly. Bowel sounds positive.  Musculoskeletal: Good range of motion, no joint swelling or tenderness, Skin: no rashes, lesions, ulcers. No induration Neurologic: CN 2-12  grossly intact. Sensation intact, DTR normal. Strength 5/5 in all 4.  Psychiatric: Normal judgment and insight. Alert and oriented x 3. Normal mood  Data Reviewed:  Glucose 110, hemoglobin 10.2 white count 8.7 platelets 261.  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  Assessment and Plan:  #1 vertigo: Most likely secondary to nasopharyngeal mass.  Possibly carcinoma.  We will admit the patient.  As needed meclizine.  ENT consult in the morning.  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  ENT consult.  I will get MRI of the brain focus on the maxillo-nasopharyngeal area.  Patient will be monitored.  #3 HIV disease: Last CD4 count was 132 in October.  Patient on HAART.  Also on dapsone.  Continue  #4 hypothyroidism: Continue with levothyroxine.    Advance Care Planning:   Code Status: Full Code   Consults: ENT consult in the morning  Family Communication: No family at bedside  Severity of Illness: The appropriate patient status for this patient is INPATIENT. Inpatient status is judged to be reasonable and necessary in order to provide the required intensity of service to ensure the patient's safety. The patient's presenting symptoms, physical exam findings, and initial radiographic and laboratory data in the context of their chronic comorbidities is felt to place them at high risk for further clinical deterioration. Furthermore, it is not anticipated that the patient will be medically stable for discharge from the hospital within 2 midnights of admission.   * I certify that at the point of admission it is my clinical judgment that the patient will require inpatient hospital care spanning beyond 2 midnights from the point of admission due to high intensity of service, high risk for further deterioration and high frequency of surveillance required.*  AuthorLonia Blood, MD 05/17/2023 7:06  PM  For on call review [email or web address].

## 2023-05-17 NOTE — ED Provider Notes (Addendum)
[place] EMERGENCY DEPARTMENT AT [facility] Provider Note   CSN: [id number] Arrival date & time: 05/17/23  1141     History  Chief Complaint  Patient presents with   Otalgia   Dizziness    Jordan Calderon is a 62 y.o. male.  62 year old male with past medical history of HIV on Hart therapy as well as hyperlipidemia presenting to the emergency department today with persistent dizziness.  The patient was admitted back on the 21st.  He states that since he has gotten home that he has had persistent dizziness.  The patient states he is also having difficulty opening his right eye that is gradually worsening since leaving the hospital.  He states he is having pain around his right ear as well.  This has made it difficult to ambulate.  He states that he has been feeling unwell now for the past week.  He denies any fevers.  Denies any recent falls.   Otalgia Dizziness      Home Medications Prior to Admission medications   Medication Sig Start Date End Date Taking? Authorizing Provider  dapsone 100 MG tablet Take 1 tablet (100 mg total) by mouth daily. Patient taking differently: Take 100 mg by mouth daily with breakfast. 01/17/23  Yes Comer, Belia Heman, MD  Darunavir-Cobicistat-Emtricitabine-Tenofovir Alafenamide [facility]) 800-150-200-10 MG TABS Take 1 tablet by mouth daily with breakfast. 01/17/23  Yes Comer, Belia Heman, MD  Pseudoephedrine-Ibuprofen (ADVIL COLD & SINUS LIQUI-GELS) 30-200 MG CAPS Take 2-3 capsules by mouth daily as needed.    [provider]  rosuvastatin (CRESTOR) 10 MG tablet Take 1 tablet (10 mg total) by mouth daily. 04/19/23   Gardiner Barefoot, MD      Allergies    Sulfonamide derivatives    Review of Systems   Review of Systems  HENT:  Positive for ear pain.   Neurological:  Positive for dizziness.  All other systems reviewed and are negative.   Physical Exam Updated Vital Signs BP 124/74   Pulse 70   Temp (!) 97.4 F (36.3 C)    Resp 16   Wt 74.6 kg   SpO2 93%   BMI 24.29 kg/m  Physical Exam Vitals and nursing note reviewed.   Gen: NAD Eyes: PERRL, EOMI HEENT: no oropharyngeal swelling Neck: trachea midline Resp: clear to auscultation bilaterally Card: RRR, no murmurs, rubs, or gallops Abd: nontender, nondistended Extremities: no calf tenderness, no edema Vascular: 2+ radial pulses bilaterally, 2+ DP pulses bilaterally Neuro: Cranial nerves intact with exception of some mild ptosis on the right, the patient is slow on past-pointing on finger-to-nose testing in both upper extremities Skin: no rashes Psyc: acting appropriately   ED Results / Procedures / Treatments   Labs (all labs ordered are listed, but only abnormal results are displayed) Labs Reviewed  BASIC METABOLIC PANEL - Abnormal; Notable for the following components:      Result Value   Glucose, Bld 110 (*)    All other components within normal limits  CBC - Abnormal; Notable for the following components:   RBC 3.15 (*)    Hemoglobin 10.2 (*)    HCT 30.3 (*)    All other components within normal limits  CULTURE, BLOOD (ROUTINE X 2)  CULTURE, BLOOD (ROUTINE X 2)  TROPONIN I (HIGH SENSITIVITY)  TROPONIN I (HIGH SENSITIVITY)    EKG EKG Interpretation Date/Time:  Wednesday May 17 2023 13:40:49 EST Ventricular Rate:  70 PR Interval:  191 QRS Duration:  87 QT  Interval:  428 QTC Calculation: 462 R Axis:   15  Text Interpretation: Sinus rhythm Abnormal R-wave progression, early transition Nonspecific ST-T changes Confirmed by Beckey Downing [id number]) on 05/17/2023 1:50:46 PM  Radiology CT ANGIO HEAD NECK W WO CM  Result Date: 05/17/2023 CLINICAL DATA:  Cerebral vasospasm suspected EXAM: CT ANGIOGRAPHY HEAD AND NECK WITH AND WITHOUT CONTRAST TECHNIQUE: Multidetector CT imaging of the head and neck was performed using the standard protocol during bolus administration of intravenous contrast. Multiplanar CT image reconstructions and MIPs  were obtained to evaluate the vascular anatomy. Carotid stenosis measurements (when applicable) are obtained utilizing NASCET criteria, using the distal internal carotid diameter as the denominator. RADIATION DOSE REDUCTION: This exam was performed according to the departmental dose-optimization program which includes automated exposure control, adjustment of the mA and/or kV according to patient size and/or use of iterative reconstruction technique. CONTRAST:  75mL OMNIPAQUE IOHEXOL 350 MG/ML SOLN COMPARISON:  None Available. FINDINGS: CT HEAD FINDINGS Brain: No evidence of acute infarction, hemorrhage, hydrocephalus, extra-axial collection or intraparenchymal mass lesion/mass effect. Vascular: See below. Skull: No acute fracture. Sinuses/Orbits: Clear sinuses.  No acute orbital findings. Other: No mastoid effusions. Review of the MIP images confirms the above findings CTA NECK FINDINGS Aortic arch: Great vessel origins are patent without significant stenosis. Right carotid system: No evidence of dissection, stenosis (50% or greater), or occlusion. Left carotid system: No evidence of dissection, stenosis (50% or greater), or occlusion. Mild atherosclerosis at the carotid bifurcation. Vertebral arteries: Codominant. No evidence of dissection, stenosis (50% or greater), or occlusion. Skeleton: No acute abnormality on limited assessment. 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 Review of the MIP images confirms the above findings CTA HEAD FINDINGS Anterior circulation: Bilateral intracranial ICAs, MCAs, and ACAs are patent without proximal hemodynamically significant stenosis. Hypoplastic left A1 ACA, likely congenital. Approximately 2 mm posteriorly inferiorly directed outpouching arising from the left carotid terminus (for example see series 605, image 111 and series 604, image 154 Posterior circulation: Tortuous vertebral  arteries. The vertebral arteries, basilar artery and bilateral posterior cerebral arteries are patent without proximal hemodynamically significant stenosis. Venous sinuses: As permitted by contrast timing, patent. Anatomic variants: Described above. Review of the MIP images confirms the above findings IMPRESSION: 1. 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 Recommend ENT consultation, correlation with direct inspection, and follow-up dedicated CT of the neck with contrast for better characterization. 2. No emergent large vessel occlusion or proximal hemodynamically significant stenosis. 3. Approximately 2 mm posteriorly inferiorly directed outpouching arising from the left carotid terminus which could represent an infundibulum with vessel not well seen versus aneurysm. 4. Aortic Atherosclerosis (ICD10-I70.0) and Emphysema (ICD10-J43.9). Electronically Signed   By: Feliberto Harts M.D.   On: 05/17/2023 14:55   DG Chest Port 1 View  Result Date: 05/17/2023 CLINICAL DATA:  Worsening cough and dry heaving with shortness of breath EXAM: PORTABLE CHEST 1 VIEW COMPARISON:  Chest radiograph dated 05/11/2023 FINDINGS: Normal lung volumes. No focal consolidations. No pleural effusion or pneumothorax. The heart size and mediastinal contours are within normal limits. No acute osseous abnormality. IMPRESSION: No active disease. Electronically Signed   By: Agustin Cree M.D.   On: 05/17/2023 14:04    Procedures Procedures    Medications Ordered in ED Medications  promethazine (PHENERGAN) 25 MG/ML injection (  Not Given 05/17/23 1401)  meclizine (ANTIVERT) tablet 25 mg (25 mg  Oral Given 05/17/23 1231)  ondansetron (ZOFRAN) injection 4 mg (4 mg Intravenous Given 05/17/23 1231)  sodium chloride 0.9 % bolus 1,000 mL (1,000 mLs Intravenous New Bag/Given 05/17/23 1359)  promethazine (PHENERGAN) 12.5 mg in sodium chloride  0.9 % 50 mL IVPB (0 mg Intravenous Stopped 05/17/23 1435)  iohexol (OMNIPAQUE) 350 MG/ML injection 100 mL (75 mLs Intravenous Contrast Given 05/17/23 1409)    ED Course/ Medical Decision Making/ A&P                                 Medical Decision Making 62 year old male with past medical history of HIV on Hart therapy as well as hyperlipidemia presenting to the emergency department today with persistent dizziness after being admitted overnight for more of a syncope workup back on the 21st.  The patient symptoms have clearly been going on now for greater than 24 to 48 hours.  The patient has had a stroke outside the window for any acute intervention at this time.  I will further evaluate patient here with basic labs.  Will give him meclizine in the event this is due to peripheral vertigo.  Given the patient's pain in the right periauricular region I will obtain a CT angiogram to evaluate for vertebral artery dissection.  I will reevaluate for ultimate disposition.  If the patient remains symptomatic he will likely require admission for MRI and further evaluation given his persistent symptoms.  The patient's EKG interpreted by me shows a sinus rhythm with a rate of 67 with normal axis, normal intervals, nonspecific ST-T changes.  I do not appreciate any ST elevations in the inferior leads.  The patient's labs are unremarkable.  CT scan shows concerns for a nasopharyngeal mass.  The symptoms persisted.  Question whether this is causing some intractable vertigo here.  Given this finding and intractable symptoms to call was placed to the hospital service for admission.  Amount and/or Complexity of Data Reviewed Labs: ordered. Radiology: ordered.  Risk Prescription drug management. Decision regarding hospitalization.           Final Clinical Impression(s) / ED Diagnoses Final diagnoses:  Dizziness  Oropharyngeal mass    Rx / DC Orders ED Discharge Orders     None          Durwin Glaze, MD 05/17/23 1514    Durwin Glaze, MD 05/17/23 1520

## 2023-05-17 NOTE — Plan of Care (Signed)
  Problem: Education: Goal: Knowledge of General Education information will improve Description: Including pain rating scale, medication(s)/side effects and non-pharmacologic comfort measures Outcome: Progressing   Problem: Clinical Measurements: Goal: Will remain free from infection Outcome: Progressing Goal: Respiratory complications will improve Outcome: Progressing Goal: Cardiovascular complication will be avoided Outcome: Progressing   Problem: Activity: Goal: Risk for activity intolerance will decrease Outcome: Progressing   Problem: Nutrition: Goal: Adequate nutrition will be maintained Outcome: Progressing   Problem: Coping: Goal: Level of anxiety will decrease Outcome: Progressing   Problem: Elimination: Goal: Will not experience complications related to bowel motility Outcome: Progressing Goal: Will not experience complications related to urinary retention Outcome: Progressing   Problem: Pain Management: Goal: General experience of comfort will improve Outcome: Progressing   Problem: Safety: Goal: Ability to remain free from injury will improve Outcome: Progressing   Problem: Skin Integrity: Goal: Risk for impaired skin integrity will decrease Outcome: Progressing

## 2023-05-17 NOTE — ED Triage Notes (Addendum)
Pt brought in by friend. Pt reports light headed ,severe right  ear pain, HA, worsening cough and dry heaves . Feels SOB Nasal congestion /runny nose Recently discharged from [facility] 11/22.2024

## 2023-05-17 NOTE — Progress Notes (Signed)
Dr. Rhae Hammock to give report:     Recent admission 11/21 syncope work up at the time.  Now more vertiginous since discharge. Unable to ambulate. Otalgia on the right. CTA - WNL. Mass on CT scan. Right ptosis as well (patient says present even at last admision) X worse since last discharge. Persistent dizzyness. Can't walk.  S/p meclizine. Still nauseous  Assesment : nasopharyngeal mass causing vertigo and likely causing above findings. Plan: to get ENT involved and possible mri with contrast on arrival to [facility].  Labs on Admission:  Results for orders placed or performed during the hospital encounter of 05/17/23 (from the past 24 hour(s))  Basic metabolic panel     Status: Abnormal   Collection Time: 05/17/23 12:32 PM  Result Value Ref Range   Sodium 135 135 - 145 mmol/L   Potassium 4.3 3.5 - 5.1 mmol/L   Chloride 99 98 - 111 mmol/L   CO2 25 22 - 32 mmol/L   Glucose, Bld 110 (H) 70 - 99 mg/dL   BUN 20 8 - 23 mg/dL   Creatinine, Ser 1.47 0.61 - 1.24 mg/dL   Calcium 9.9 8.9 - 82.9 mg/dL   GFR, Estimated >56 >21 mL/min   Anion gap 11 5 - 15  CBC     Status: Abnormal   Collection Time: 05/17/23 12:32 PM  Result Value Ref Range   WBC 8.7 4.0 - 10.5 K/uL   RBC 3.15 (L) 4.22 - 5.81 MIL/uL   Hemoglobin 10.2 (L) 13.0 - 17.0 g/dL   HCT 30.8 (L) 65.7 - 84.6 %   MCV 96.2 80.0 - 100.0 fL   MCH 32.4 26.0 - 34.0 pg   MCHC 33.7 30.0 - 36.0 g/dL   RDW 96.2 95.2 - 84.1 %   Platelets 261 150 - 400 K/uL   nRBC 0.0 0.0 - 0.2 %  Troponin I (High Sensitivity)     Status: None   Collection Time: 05/17/23 12:32 PM  Result Value Ref Range   Troponin I (High Sensitivity) 13 <18 ng/L  Troponin I (High Sensitivity)     Status: None   Collection Time: 05/17/23  2:13 PM  Result Value Ref Range   Troponin I (High Sensitivity) 9 <18 ng/L   Basic Metabolic Panel: Recent Labs  Lab 05/11/23 1135 05/11/23 1331 05/12/23 0627 05/17/23 1232  NA 132*  --  132* 135  K 3.7  --  4.1 4.3  CL 102  --  104  99  CO2 20*  --  22 25  GLUCOSE 155*  --  88 110*  BUN 27*  --  25* 20  CREATININE 1.58*  --  1.12 1.17  CALCIUM 9.1  --  8.6* 9.9  MG  --  2.2  --   --    Liver Function Tests: Recent Labs  Lab 05/11/23 1135  AST 35  ALT 33  ALKPHOS 111  BILITOT 1.2*  PROT 8.2*  ALBUMIN 4.1   No results for input(s): "LIPASE", "AMYLASE" in the last 168 hours. No results for input(s): "AMMONIA" in the last 168 hours. CBC: Recent Labs  Lab 05/11/23 1135 05/12/23 0627 05/17/23 1232  WBC 12.3* 9.4 8.7  NEUTROABS 6.7  --   --   HGB 13.0 11.7* 10.2*  HCT 37.7* 35.0* 30.3*  MCV 94.3 96.7 96.2  PLT 325 177 261   Cardiac Enzymes: Recent Labs  Lab 05/11/23 1135 05/11/23 1321 05/17/23 1232 05/17/23 1413  TROPONINIHS 27* 21* 13 9  BNP (last 3 results) No results for input(s): "PROBNP" in the last 8760 hours. CBG: Recent Labs  Lab 05/11/23 1113 05/12/23 0556  GLUCAP 161* 89    Radiological Exams on Admission:  CT ANGIO HEAD NECK W WO CM  Result Date: 05/17/2023 CLINICAL DATA:  Cerebral vasospasm suspected EXAM: CT ANGIOGRAPHY HEAD AND NECK WITH AND WITHOUT CONTRAST TECHNIQUE: Multidetector CT imaging of the head and neck was performed using the standard protocol during bolus administration of intravenous contrast. Multiplanar CT image reconstructions and MIPs were obtained to evaluate the vascular anatomy. Carotid stenosis measurements (when applicable) are obtained utilizing NASCET criteria, using the distal internal carotid diameter as the denominator. RADIATION DOSE REDUCTION: This exam was performed according to the departmental dose-optimization program which includes automated exposure control, adjustment of the mA and/or kV according to patient size and/or use of iterative reconstruction technique. CONTRAST:  75mL OMNIPAQUE IOHEXOL 350 MG/ML SOLN COMPARISON:  None Available. FINDINGS: CT HEAD FINDINGS Brain: No evidence of acute infarction, hemorrhage, hydrocephalus,  extra-axial collection or intraparenchymal mass lesion/mass effect. Vascular: See below. Skull: No acute fracture. Sinuses/Orbits: Clear sinuses.  No acute orbital findings. Other: No mastoid effusions. Review of the MIP images confirms the above findings CTA NECK FINDINGS Aortic arch: Great vessel origins are patent without significant stenosis. Right carotid system: No evidence of dissection, stenosis (50% or greater), or occlusion. Left carotid system: No evidence of dissection, stenosis (50% or greater), or occlusion. Mild atherosclerosis at the carotid bifurcation. Vertebral arteries: Codominant. No evidence of dissection, stenosis (50% or greater), or occlusion. Skeleton: No acute abnormality on limited assessment. 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 Review of the MIP images confirms the above findings CTA HEAD FINDINGS Anterior circulation: Bilateral intracranial ICAs, MCAs, and ACAs are patent without proximal hemodynamically significant stenosis. Hypoplastic left A1 ACA, likely congenital. Approximately 2 mm posteriorly inferiorly directed outpouching arising from the left carotid terminus (for example see series 605, image 111 and series 604, image 154 Posterior circulation: Tortuous vertebral arteries. The vertebral arteries, basilar artery and bilateral posterior cerebral arteries are patent without proximal hemodynamically significant stenosis. Venous sinuses: As permitted by contrast timing, patent. Anatomic variants: Described above. Review of the MIP images confirms the above findings IMPRESSION: 1. 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 Recommend ENT consultation, correlation with direct inspection, and follow-up dedicated CT of the neck with contrast for better characterization. 2. No emergent large  vessel occlusion or proximal hemodynamically significant stenosis. 3. Approximately 2 mm posteriorly inferiorly directed outpouching arising from the left carotid terminus which could represent an infundibulum with vessel not well seen versus aneurysm. 4. Aortic Atherosclerosis (ICD10-I70.0) and Emphysema (ICD10-J43.9). Electronically Signed   By: Feliberto Harts M.D.   On: 05/17/2023 14:55   DG Chest Port 1 View  Result Date: 05/17/2023 CLINICAL DATA:  Worsening cough and dry heaving with shortness of breath EXAM: PORTABLE CHEST 1 VIEW COMPARISON:  Chest radiograph dated 05/11/2023 FINDINGS: Normal lung volumes. No focal consolidations. No pleural effusion or pneumothorax. The heart size and mediastinal contours are within normal limits. No acute osseous abnormality. IMPRESSION: No active disease. Electronically Signed   By: Agustin Cree M.D.   On: 05/17/2023 14:04    EKG: Independently reviewed.

## 2023-05-17 NOTE — ED Notes (Signed)
Called floor for report, RN to call back when available. Call back number given to RN Sec

## 2023-05-17 NOTE — ED Notes (Signed)
ED TO INPATIENT HANDOFF REPORT  ED Nurse Name and Phone #: Dominga Ferry [facility] Paramedic [phone]  S Name/Age/Gender Jordan Calderon 62 y.o. male Room/Bed: MH09/MH09  Code Status   Code Status: Full Code  Home/SNF/Other Home Patient oriented to: self, place, time, and situation Is this baseline? Yes   Triage Complete: Triage complete  Chief Complaint Vertigo [R42]  Triage Note Pt brought in by friend. Pt reports light headed ,severe right  ear pain, HA, worsening cough and dry heaves . Feels SOB Nasal congestion /runny nose Recently discharged from [facility] 11/22.2024   Allergies Allergies  Allergen Reactions   Sulfonamide Derivatives Itching    Level of Care/Admitting Diagnosis ED Disposition     ED Disposition  Admit   Condition  --   Comment  Hospital Area: [facility] [[id number]]  Level of Care: Med-Surg [16]  May admit patient to [facility] or [facility] if equivalent level of care is available:: No  Interfacility transfer: Yes  Covid Evaluation: Asymptomatic - no recent exposure (last 10 days) testing not required  Diagnosis: Vertigo [207257]  Admitting Physician: Nolberto Hanlon [[id number]]  Attending Physician: Nolberto Hanlon [[id number]]  Certification:: I certify this patient will need inpatient services for at least 2 midnights  Expected Medical Readiness: 05/19/2023          B Medical/Surgery History Past Medical History:  Diagnosis Date   Anxiety    Cellulitis 02/11/2014   LEFT ARM   Depression    Headache(784.0)    HIV (human immunodeficiency virus infection) (HCC)    Past Surgical History:  Procedure Laterality Date   ARM HARDWARE REMOVAL Left    arm surgery     HEMORRHOID SURGERY       A IV Location/Drains/Wounds Patient Lines/Drains/Airways Status     Active Line/Drains/Airways     Name Placement date Placement time Site Days   Peripheral IV 05/17/23 20 G Left Antecubital 05/17/23  1357  Antecubital  less than 1             Intake/Output Last 24 hours  Intake/Output Summary (Last 24 hours) at 05/17/2023 1604 Last data filed at 05/17/2023 1523 Gross per 24 hour  Intake 1045.08 ml  Output --  Net 1045.08 ml    Labs/Imaging Results for orders placed or performed during the hospital encounter of 05/17/23 (from the past 48 hour(s))  Basic metabolic panel     Status: Abnormal   Collection Time: 05/17/23 12:32 PM  Result Value Ref Range   Sodium 135 135 - 145 mmol/L   Potassium 4.3 3.5 - 5.1 mmol/L   Chloride 99 98 - 111 mmol/L   CO2 25 22 - 32 mmol/L   Glucose, Bld 110 (H) 70 - 99 mg/dL    Comment: Glucose reference range applies only to samples taken after fasting for at least 8 hours.   BUN 20 8 - 23 mg/dL   Creatinine, Ser 2.45 0.61 - 1.24 mg/dL   Calcium 9.9 8.9 - 80.9 mg/dL   GFR, Estimated >98 >33 mL/min    Comment: (NOTE) Calculated using the CKD-EPI Creatinine Equation (2021)    Anion gap 11 5 - 15    Comment: Performed at [facility], [address] [place], [place] [address]  CBC     Status: Abnormal   Collection Time: 05/17/23 12:32 PM  Result Value Ref Range   WBC 8.7 4.0 - 10.5 K/uL   RBC 3.15 (L)  4.22 - 5.81 MIL/uL   Hemoglobin 10.2 (L) 13.0 - 17.0 g/dL   HCT 16.1 (L) 09.6 - 04.5 %   MCV 96.2 80.0 - 100.0 fL   MCH 32.4 26.0 - 34.0 pg   MCHC 33.7 30.0 - 36.0 g/dL   RDW 40.9 81.1 - 91.4 %   Platelets 261 150 - 400 K/uL   nRBC 0.0 0.0 - 0.2 %    Comment: Performed at [facility], [address] [place], [place] [address]  Troponin I (High Sensitivity)     Status: None   Collection Time: 05/17/23 12:32 PM  Result Value Ref Range   Troponin I (High Sensitivity) 13 <18 ng/L    Comment: (NOTE) Elevated high sensitivity troponin I (hsTnI) values and significant  changes across serial measurements may suggest ACS but many other  chronic and acute conditions are known to elevate hsTnI results.  Refer to the "Links"  section for chest pain algorithms and additional  guidance. Performed at [facility], [address] [place], [place] [address]   Troponin I (High Sensitivity)     Status: None   Collection Time: 05/17/23  2:13 PM  Result Value Ref Range   Troponin I (High Sensitivity) 9 <18 ng/L    Comment: (NOTE) Elevated high sensitivity troponin I (hsTnI) values and significant  changes across serial measurements may suggest ACS but many other  chronic and acute conditions are known to elevate hsTnI results.  Refer to the "Links" section for chest pain algorithms and additional  guidance. Performed at [facility], [address] [place], [place] [address]    CT ANGIO HEAD NECK W WO CM  Result Date: 05/17/2023 CLINICAL DATA:  Cerebral vasospasm suspected EXAM: CT ANGIOGRAPHY HEAD AND NECK WITH AND WITHOUT CONTRAST TECHNIQUE: Multidetector CT imaging of the head and neck was performed using the standard protocol during bolus administration of intravenous contrast. Multiplanar CT image reconstructions and MIPs were obtained to evaluate the vascular anatomy. Carotid stenosis measurements (when applicable) are obtained utilizing NASCET criteria, using the distal internal carotid diameter as the denominator. RADIATION DOSE REDUCTION: This exam was performed according to the departmental dose-optimization program which includes automated exposure control, adjustment of the mA and/or kV according to patient size and/or use of iterative reconstruction technique. CONTRAST:  75mL OMNIPAQUE IOHEXOL 350 MG/ML SOLN COMPARISON:  None Available. FINDINGS: CT HEAD FINDINGS Brain: No evidence of acute infarction, hemorrhage, hydrocephalus, extra-axial collection or intraparenchymal mass lesion/mass effect. Vascular: See below. Skull: No acute fracture. Sinuses/Orbits: Clear sinuses.  No acute orbital findings. Other: No mastoid effusions. Review of the MIP images confirms the above findings CTA NECK  FINDINGS Aortic arch: Great vessel origins are patent without significant stenosis. Right carotid system: No evidence of dissection, stenosis (50% or greater), or occlusion. Left carotid system: No evidence of dissection, stenosis (50% or greater), or occlusion. Mild atherosclerosis at the carotid bifurcation. Vertebral arteries: Codominant. No evidence of dissection, stenosis (50% or greater), or occlusion. Skeleton: No acute abnormality on limited assessment. 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 Review of the MIP images confirms the above findings CTA HEAD FINDINGS Anterior circulation: Bilateral intracranial ICAs, MCAs, and ACAs are patent without proximal hemodynamically significant stenosis. Hypoplastic left A1 ACA, likely congenital. Approximately 2 mm posteriorly inferiorly directed outpouching arising from the left carotid terminus (for example see series 605, image 111 and series 604, image 154  Posterior circulation: Tortuous vertebral arteries. The vertebral arteries, basilar artery and bilateral posterior cerebral arteries are patent without proximal hemodynamically significant stenosis. Venous sinuses: As permitted by contrast timing, patent. Anatomic variants: Described above. Review of the MIP images confirms the above findings IMPRESSION: 1. 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 Recommend ENT consultation, correlation with direct inspection, and follow-up dedicated CT of the neck with contrast for better characterization. 2. No emergent large vessel occlusion or proximal hemodynamically significant stenosis. 3. Approximately 2 mm posteriorly inferiorly directed outpouching arising from the left carotid terminus which could represent an infundibulum with vessel not well seen versus aneurysm. 4. Aortic  Atherosclerosis (ICD10-I70.0) and Emphysema (ICD10-J43.9). Electronically Signed   By: Feliberto Harts M.D.   On: 05/17/2023 14:55   DG Chest Port 1 View  Result Date: 05/17/2023 CLINICAL DATA:  Worsening cough and dry heaving with shortness of breath EXAM: PORTABLE CHEST 1 VIEW COMPARISON:  Chest radiograph dated 05/11/2023 FINDINGS: Normal lung volumes. No focal consolidations. No pleural effusion or pneumothorax. The heart size and mediastinal contours are within normal limits. No acute osseous abnormality. IMPRESSION: No active disease. Electronically Signed   By: Agustin Cree M.D.   On: 05/17/2023 14:04    Pending Labs Unresulted Labs (From admission, onward)     Start     Ordered   05/24/23 0500  Creatinine, serum  (enoxaparin (LOVENOX)    CrCl >/= 30 ml/min)  Weekly,   R     Comments: while on enoxaparin therapy    05/17/23 1532   05/18/23 0500  Lipid panel  (Labs)  Tomorrow morning,   R       Comments: Fasting    05/17/23 1532   05/17/23 1532  Hemoglobin A1c  (Labs)  Once,   URGENT       Comments: To assess prior glycemic control    05/17/23 1532   05/17/23 1532  CBC  (enoxaparin (LOVENOX)    CrCl >/= 30 ml/min)  Once,   STAT       Comments: Baseline for enoxaparin therapy IF NOT ALREADY DRAWN.  Notify MD if PLT < 100 K.    05/17/23 1532   05/17/23 1532  Creatinine, serum  (enoxaparin (LOVENOX)    CrCl >/= 30 ml/min)  Once,   STAT       Comments: Baseline for enoxaparin therapy IF NOT ALREADY DRAWN.    05/17/23 1532   05/17/23 1213  Blood culture (routine x 2)  BLOOD CULTURE X 2,   STAT      05/17/23 1213            Vitals/Pain Today's Vitals   05/17/23 1400 05/17/23 1430 05/17/23 1500 05/17/23 1530  BP: 137/82 122/76 124/74 (!) 120/99  Pulse: 73 69 70 80  Resp: 10 14 16 15   Temp:      SpO2: 93% 95% 93% 98%  Weight:      PainSc:        Isolation Precautions No active isolations  Medications Medications  promethazine (PHENERGAN) 25 MG/ML injection (  Not  Given 05/17/23 1401)  acetaminophen (TYLENOL) tablet 650 mg (has no administration in time range)    Or  acetaminophen (TYLENOL) 160 MG/5ML solution 650 mg (has no administration in time range)    Or  acetaminophen (TYLENOL) suppository 650 mg (has no administration in time range)  senna-docusate (Senokot-S) tablet 1 tablet (has no administration in time range)  enoxaparin (  LOVENOX) injection 40 mg (has no administration in time range)  meclizine (ANTIVERT) tablet 25 mg (25 mg Oral Given 05/17/23 1231)  ondansetron (ZOFRAN) injection 4 mg (4 mg Intravenous Given 05/17/23 1231)  sodium chloride 0.9 % bolus 1,000 mL ( Intravenous Stopped 05/17/23 1522)  promethazine (PHENERGAN) 12.5 mg in sodium chloride 0.9 % 50 mL IVPB (0 mg Intravenous Stopped 05/17/23 1435)  iohexol (OMNIPAQUE) 350 MG/ML injection 100 mL (75 mLs Intravenous Contrast Given 05/17/23 1409)  diazepam (VALIUM) injection 2.5 mg (2.5 mg Intravenous Given 05/17/23 1521)    Mobility walks with person assist     Focused Assessments     R Recommendations: See Admitting Provider Note  Report given to:   Additional Notes:

## 2023-05-17 NOTE — ED Notes (Signed)
Patient transported to CT 

## 2023-05-18 ENCOUNTER — Inpatient Hospital Stay (HOSPITAL_COMMUNITY): Payer: Medicare Other

## 2023-05-18 DIAGNOSIS — D106 Benign neoplasm of nasopharynx: Secondary | ICD-10-CM

## 2023-05-18 DIAGNOSIS — J343 Hypertrophy of nasal turbinates: Secondary | ICD-10-CM | POA: Diagnosis not present

## 2023-05-18 DIAGNOSIS — R0981 Nasal congestion: Secondary | ICD-10-CM

## 2023-05-18 DIAGNOSIS — R42 Dizziness and giddiness: Principal | ICD-10-CM

## 2023-05-18 DIAGNOSIS — J392 Other diseases of pharynx: Secondary | ICD-10-CM | POA: Diagnosis not present

## 2023-05-18 DIAGNOSIS — J342 Deviated nasal septum: Secondary | ICD-10-CM

## 2023-05-18 DIAGNOSIS — J310 Chronic rhinitis: Secondary | ICD-10-CM

## 2023-05-18 LAB — LIPID PANEL
Cholesterol: 117 mg/dL (ref 0–200)
HDL: 20 mg/dL — ABNORMAL LOW (ref >40)
LDL Cholesterol: 74 mg/dL (ref 0–99)
Total CHOL/HDL Ratio: 5.9 {ratio}
Triglycerides: 113 mg/dL (ref <150)
VLDL: 23 mg/dL (ref 0–40)

## 2023-05-18 LAB — CBC
HCT: 27.2 % — ABNORMAL LOW (ref 39.0–52.0)
Hemoglobin: 9.1 g/dL — ABNORMAL LOW (ref 13.0–17.0)
MCH: 32.3 pg (ref 26.0–34.0)
MCHC: 33.5 g/dL (ref 30.0–36.0)
MCV: 96.5 fL (ref 80.0–100.0)
Platelets: 215 10*3/uL (ref 150–400)
RBC: 2.82 MIL/uL — ABNORMAL LOW (ref 4.22–5.81)
RDW: 12.9 % (ref 11.5–15.5)
WBC: 6.9 10*3/uL (ref 4.0–10.5)
nRBC: 0 % (ref 0.0–0.2)

## 2023-05-18 LAB — COMPREHENSIVE METABOLIC PANEL
ALT: 26 U/L (ref 0–44)
AST: 24 U/L (ref 15–41)
Albumin: 3.1 g/dL — ABNORMAL LOW (ref 3.5–5.0)
Alkaline Phosphatase: 87 U/L (ref 38–126)
Anion gap: 6 (ref 5–15)
BUN: 17 mg/dL (ref 8–23)
CO2: 22 mmol/L (ref 22–32)
Calcium: 8.4 mg/dL — ABNORMAL LOW (ref 8.9–10.3)
Chloride: 104 mmol/L (ref 98–111)
Creatinine, Ser: 1.08 mg/dL (ref 0.61–1.24)
GFR, Estimated: >60 mL/min (ref >60)
Glucose, Bld: 103 mg/dL — ABNORMAL HIGH (ref 70–99)
Potassium: 4.1 mmol/L (ref 3.5–5.1)
Sodium: 132 mmol/L — ABNORMAL LOW (ref 135–145)
Total Bilirubin: 0.5 mg/dL (ref <1.2)
Total Protein: 7 g/dL (ref 6.5–8.1)

## 2023-05-18 LAB — HEMOGLOBIN A1C
Hgb A1c MFr Bld: 4.2 % — ABNORMAL LOW (ref 4.8–5.6)
Mean Plasma Glucose: 74 mg/dL

## 2023-05-18 LAB — SURGICAL PCR SCREEN
MRSA, PCR: NEGATIVE
Staphylococcus aureus: NEGATIVE

## 2023-05-18 MED ORDER — LORAZEPAM 0.5 MG PO TABS: 0.5000 mg | ORAL_TABLET | Freq: Three times a day (TID) | ORAL | Status: DC | PRN

## 2023-05-18 MED ORDER — MORPHINE SULFATE (PF) 2 MG/ML IV SOLN
4.0000 mg | INTRAVENOUS | Status: DC | PRN
  Administered 2023-05-18 – 2023-05-22 (×9): 4 mg via INTRAVENOUS
  Filled 2023-05-18 (×9): qty 2

## 2023-05-18 MED ORDER — LORAZEPAM 2 MG/ML IJ SOLN
1.0000 mg | INTRAMUSCULAR | Status: DC | PRN
  Administered 2023-05-18 – 2023-05-21 (×5): 1 mg via INTRAVENOUS
  Filled 2023-05-18 (×5): qty 1

## 2023-05-18 MED ORDER — IOHEXOL 350 MG/ML SOLN
75.0000 mL | Freq: Once | INTRAVENOUS | Status: AC | PRN
  Administered 2023-05-18: 75 mL via INTRAVENOUS

## 2023-05-18 NOTE — H&P (Signed)
Cc: Dizziness, nasopharyngeal mass, chronic right-sided nasal congestion  Jordan Calderon is a 62 y.o. male.  HPI: The patient is a 62 year old male who was admitted yesterday from the emergency room for treatment of his chronic dizziness, generalized weakness, and a newfound right nasopharyngeal soft tissue mass.  The patient was also complaining of right ear pain and was noted to have right eye ptosis.  According to the patient, he has been experiencing right-sided nasal congestion for the past year.  He is not on any nasal or allergy medications.  He denies any recent sinusitis.  He has no previous ENT surgery.  He is HIV positive.  Past Medical History:  Diagnosis Date   Anxiety    Cellulitis 02/11/2014   LEFT ARM   Depression    Headache(784.0)    HIV (human immunodeficiency virus infection) (HCC)     Past Surgical History:  Procedure Laterality Date   ARM HARDWARE REMOVAL Left    arm surgery     HEMORRHOID SURGERY      Family History  Adopted: Yes    Social History:  reports that he quit smoking about 8 years ago. His smoking use included cigarettes. He started smoking about 38 years ago. He has a 3 pack-year smoking history. He has never used smokeless tobacco. He reports that he does not currently use drugs. He reports that he does not drink alcohol.  Allergies:  Allergies  Allergen Reactions   Sulfonamide Derivatives Hives and Itching    Medications: I have reviewed the patient's current medications. Scheduled:  calcium carbonate  1 tablet Oral TID WC   dapsone  100 mg Oral Q breakfast   Darunavir-Cobicistat-Emtricitabine-Tenofovir Alafenamide  1 tablet Oral Q breakfast   enoxaparin (LOVENOX) injection  40 mg Subcutaneous Q24H   Continuous:  dextrose 5 % and 0.9 % NaCl 75 mL/hr at 05/18/23 0981   XBJ:[id number] **OR** acetaminophen, ibuprofen, LORazepam, meclizine, morphine injection, ondansetron **OR** ondansetron (ZOFRAN) IV, senna-docusate  Results for  orders placed or performed during the hospital encounter of 05/17/23 (from the past 48 hour(s))  Blood culture (routine x 2)     Status: None (Preliminary result)   Collection Time: 05/17/23 12:13 PM   Specimen: BLOOD  Result Value Ref Range   Specimen Description      BLOOD LEFT ANTECUBITAL Performed at [facility], [address] [place], [place] [address]    Special Requests      BOTTLES DRAWN AEROBIC AND ANAEROBIC Blood Culture adequate volume Performed at [facility], [address] [place], [place] [address]    Culture      NO GROWTH < 24 HOURS Performed at [facility], [address] [place], [place] [address]    Report Status PENDING   Blood culture (routine x 2)     Status: None (Preliminary result)   Collection Time: 05/17/23 12:18 PM   Specimen: BLOOD RIGHT HAND  Result Value Ref Range   Specimen Description      BLOOD RIGHT HAND Performed at [facility], [address] [place], [place] [address]    Special Requests      BOTTLES DRAWN AEROBIC AND ANAEROBIC Blood Culture adequate volume Performed at [facility], [address] [place], [place] [address]    Culture      NO GROWTH < 24 HOURS Performed at [facility], [address], [place], [place] [address]    Report  Status PENDING   Basic metabolic panel     Status: Abnormal   Collection Time: 05/17/23 12:32 PM  Result Value Ref Range   Sodium 135 135 - 145 mmol/L   Potassium 4.3 3.5 - 5.1 mmol/L   Chloride 99 98 - 111 mmol/L   CO2 25 22 - 32 mmol/L   Glucose, Bld 110 (H) 70 - 99 mg/dL    Comment: Glucose reference range applies only to samples taken after fasting for at least 8 hours.   BUN 20 8 - 23 mg/dL   Creatinine, Ser 0.86 0.61 - 1.24 mg/dL   Calcium 9.9 8.9 - 57.8 mg/dL   GFR, Estimated >46 >96 mL/min    Comment: (NOTE) Calculated using the CKD-EPI Creatinine Equation (2021)    Anion gap 11 5 - 15    Comment: Performed at [facility], [address] [place], [place] [address]  CBC     Status: Abnormal   Collection Time: 05/17/23 12:32 PM  Result Value Ref Range   WBC 8.7 4.0 - 10.5 K/uL   RBC 3.15 (L) 4.22 - 5.81 MIL/uL   Hemoglobin 10.2 (L) 13.0 - 17.0 g/dL   HCT 41.3 (L) 24.4 - 01.0 %   MCV 96.2 80.0 - 100.0 fL   MCH 32.4 26.0 - 34.0 pg   MCHC 33.7 30.0 - 36.0 g/dL   RDW 27.2 53.6 - 64.4 %   Platelets 261 150 - 400 K/uL   nRBC 0.0 0.0 - 0.2 %    Comment: Performed at [facility], [address] [place], [place] [address]  Troponin I (High Sensitivity)     Status: None   Collection Time: 05/17/23 12:32 PM  Result Value Ref Range   Troponin I (High Sensitivity) 13 <18 ng/L    Comment: (NOTE) Elevated high sensitivity troponin I (hsTnI) values and significant  changes across serial measurements may suggest ACS but many other  chronic and acute conditions are known to elevate hsTnI results.  Refer to the "Links" section for chest pain algorithms and additional  guidance. Performed at [facility], [address] [place], [place] [address]   Troponin I (High Sensitivity)     Status: None   Collection Time: 05/17/23  2:13 PM  Result Value Ref Range   Troponin I (High Sensitivity) 9 <18 ng/L    Comment: (NOTE) Elevated high sensitivity troponin I (hsTnI) values and significant  changes across serial measurements may suggest ACS but many other  chronic and acute conditions are known to elevate hsTnI results.  Refer to the "Links" section for chest pain algorithms and additional  guidance. Performed at [facility], [address] [place], [place] [address]   Hemoglobin A1c     Status: Abnormal   Collection Time: 05/17/23  5:41 PM  Result Value Ref Range   Hgb A1c MFr Bld 4.2 (L) 4.8 - 5.6 %    Comment: (NOTE)         Prediabetes: 5.7 - 6.4         Diabetes: >6.4         Glycemic control for adults with diabetes: <7.0     Mean Plasma Glucose 74 mg/dL    Comment: (NOTE) Performed At: [facility] [address] [place], [place] [id number] Jolene Schimke MD CZ:[id number]   CBC     Status: Abnormal   Collection Time: 05/17/23  5:41  PM  Result Value Ref Range   WBC 9.6 4.0 - 10.5 K/uL   RBC 3.04 (L) 4.22 - 5.81 MIL/uL   Hemoglobin 9.8 (L) 13.0 - 17.0 g/dL   HCT 16.1 (L) 09.6 - 04.5 %   MCV 95.1 80.0 - 100.0 fL   MCH 32.2 26.0 - 34.0 pg   MCHC 33.9 30.0 - 36.0 g/dL   RDW 40.9 81.1 - 91.4 %   Platelets 250 150 - 400 K/uL   nRBC 0.0 0.0 - 0.2 %    Comment: Performed at [facility], [address], [place], [place] [address]  Creatinine, serum     Status: None   Collection Time: 05/17/23  5:41 PM  Result Value Ref Range   Creatinine, Ser 1.15 0.61 - 1.24 mg/dL   GFR, Estimated >62 >13 mL/min    Comment: (NOTE) Calculated using the CKD-EPI Creatinine Equation (2021) Performed at [facility], [address] [place], [place] [address]   Lipid panel     Status: Abnormal   Collection Time: 05/18/23  4:52 AM  Result Value Ref Range   Cholesterol 117 0 - 200 mg/dL   Triglycerides 846 <962 mg/dL   HDL 20 (L) >95 mg/dL   Total CHOL/HDL Ratio 5.9 RATIO   VLDL 23 0 - 40 mg/dL   LDL Cholesterol 74 0 - 99 mg/dL    Comment:        Total Cholesterol/HDL:CHD Risk Coronary Heart Disease Risk Table                     Men   Women  1/2 Average Risk   3.4   3.3  Average Risk       5.0   4.4  2 X Average Risk   9.6   7.1  3 X Average Risk  23.4   11.0        Use the calculated Patient Ratio above and the CHD Risk Table to determine the patient's CHD Risk.        ATP III CLASSIFICATION (LDL):  <100     mg/dL   Optimal  284-132  mg/dL   Near or Above                    Optimal  130-159  mg/dL   Borderline  440-102  mg/dL   High  >725     mg/dL   Very High Performed at [facility], [address] [place], [place] [address]   Comprehensive metabolic panel     Status: Abnormal    Collection Time: 05/18/23  4:52 AM  Result Value Ref Range   Sodium 132 (L) 135 - 145 mmol/L   Potassium 4.1 3.5 - 5.1 mmol/L   Chloride 104 98 - 111 mmol/L   CO2 22 22 - 32 mmol/L   Glucose, Bld 103 (H) 70 - 99 mg/dL    Comment: Glucose reference range applies only to samples taken after fasting for at least 8 hours.   BUN 17 8 - 23 mg/dL   Creatinine, Ser 0.34 0.61 - 1.24 mg/dL   Calcium 8.4 (L) 8.9 - 10.3 mg/dL   Total Protein 7.0 6.5 - 8.1 g/dL   Albumin 3.1 (L) 3.5 - 5.0 g/dL   AST 24 15 - 41 U/L   ALT 26 0 - 44 U/L   Alkaline Phosphatase 87 38 - 126 U/L   Total Bilirubin 0.5 <1.2 mg/dL  GFR, Estimated >60 >60 mL/min    Comment: (NOTE) Calculated using the CKD-EPI Creatinine Equation (2021)    Anion gap 6 5 - 15    Comment: Performed at [facility], [address] [place], [place] [address]  CBC     Status: Abnormal   Collection Time: 05/18/23  4:52 AM  Result Value Ref Range   WBC 6.9 4.0 - 10.5 K/uL   RBC 2.82 (L) 4.22 - 5.81 MIL/uL   Hemoglobin 9.1 (L) 13.0 - 17.0 g/dL   HCT 95.6 (L) 21.3 - 08.6 %   MCV 96.5 80.0 - 100.0 fL   MCH 32.3 26.0 - 34.0 pg   MCHC 33.5 30.0 - 36.0 g/dL   RDW 57.8 46.9 - 62.9 %   Platelets 215 150 - 400 K/uL   nRBC 0.0 0.0 - 0.2 %    Comment: Performed at [facility], [address] [place], [place] [address]    MR ANGIO HEAD WO CONTRAST  Result Date: 05/17/2023 CLINICAL DATA:  Initial evaluation for persistent dizziness. EXAM: MRA HEAD WITHOUT CONTRAST TECHNIQUE: Angiographic images of the Circle of Willis were acquired using MRA technique without intravenous contrast. COMPARISON:  CTA from earlier the same day. FINDINGS: Anterior circulation: Both internal carotid arteries are widely patent through the siphons without stenosis or other abnormality. Again seen is a 3 mm outpouching extending posteriorly and laterally from the left ICA terminus (series 2, image 93). Possible small vessel emanating from its apex, not  entirely certain. Finding could reflect an vascular infundibulum versus aneurysm. A1 segments patent bilaterally, with the right dominant. Normal anterior communicating artery complex. Anterior cerebral arteries patent without stenosis. No M1 stenosis or occlusion. Distal MCA branches perfused and symmetric. Posterior circulation: Visualized V4 segments patent without stenosis. Right PICA patent. Left PICA origin not seen. Basilar patent without stenosis. Superior cerebellar and posterior cerebral arteries widely patent bilaterally. Anatomic variants: As above. Other: Previously identified mass at the right skull base noted, partially visualized, better evaluated on prior CT. IMPRESSION: 1. 3 mm outpouching extending posteriorly and laterally from the left ICA terminus, potentially reflecting an vascular infundibulum versus aneurysm. Attention at follow-up recommended. 2. Otherwise normal intracranial MRA. No large vessel occlusion or other emergent finding. Electronically Signed   By: Rise Mu M.D.   On: 05/17/2023 20:58   CT ANGIO HEAD NECK W WO CM  Result Date: 05/17/2023 CLINICAL DATA:  Cerebral vasospasm suspected EXAM: CT ANGIOGRAPHY HEAD AND NECK WITH AND WITHOUT CONTRAST TECHNIQUE: Multidetector CT imaging of the head and neck was performed using the standard protocol during bolus administration of intravenous contrast. Multiplanar CT image reconstructions and MIPs were obtained to evaluate the vascular anatomy. Carotid stenosis measurements (when applicable) are obtained utilizing NASCET criteria, using the distal internal carotid diameter as the denominator. RADIATION DOSE REDUCTION: This exam was performed according to the departmental dose-optimization program which includes automated exposure control, adjustment of the mA and/or kV according to patient size and/or use of iterative reconstruction technique. CONTRAST:  75mL OMNIPAQUE IOHEXOL 350 MG/ML SOLN COMPARISON:  None Available.  FINDINGS: CT HEAD FINDINGS Brain: No evidence of acute infarction, hemorrhage, hydrocephalus, extra-axial collection or intraparenchymal mass lesion/mass effect. Vascular: See below. Skull: No acute fracture. Sinuses/Orbits: Clear sinuses.  No acute orbital findings. Other: No mastoid effusions. Review of the MIP images confirms the above findings CTA NECK FINDINGS Aortic arch: Great vessel origins are patent without significant stenosis. Right carotid system: No evidence of dissection, stenosis (50%  or greater), or occlusion. Left carotid system: No evidence of dissection, stenosis (50% or greater), or occlusion. Mild atherosclerosis at the carotid bifurcation. Vertebral arteries: Codominant. No evidence of dissection, stenosis (50% or greater), or occlusion. Skeleton: No acute abnormality on limited assessment. 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 Review of the MIP images confirms the above findings CTA HEAD FINDINGS Anterior circulation: Bilateral intracranial ICAs, MCAs, and ACAs are patent without proximal hemodynamically significant stenosis. Hypoplastic left A1 ACA, likely congenital. Approximately 2 mm posteriorly inferiorly directed outpouching arising from the left carotid terminus (for example see series 605, image 111 and series 604, image 154 Posterior circulation: Tortuous vertebral arteries. The vertebral arteries, basilar artery and bilateral posterior cerebral arteries are patent without proximal hemodynamically significant stenosis. Venous sinuses: As permitted by contrast timing, patent. Anatomic variants: Described above. Review of the MIP images confirms the above findings IMPRESSION: 1. 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 Recommend ENT consultation, correlation with direct inspection, and  follow-up dedicated CT of the neck with contrast for better characterization. 2. No emergent large vessel occlusion or proximal hemodynamically significant stenosis. 3. Approximately 2 mm posteriorly inferiorly directed outpouching arising from the left carotid terminus which could represent an infundibulum with vessel not well seen versus aneurysm. 4. Aortic Atherosclerosis (ICD10-I70.0) and Emphysema (ICD10-J43.9). Electronically Signed   By: Feliberto Harts M.D.   On: 05/17/2023 14:55   DG Chest Port 1 View  Result Date: 05/17/2023 CLINICAL DATA:  Worsening cough and dry heaving with shortness of breath EXAM: PORTABLE CHEST 1 VIEW COMPARISON:  Chest radiograph dated 05/11/2023 FINDINGS: Normal lung volumes. No focal consolidations. No pleural effusion or pneumothorax. The heart size and mediastinal contours are within normal limits. No acute osseous abnormality. IMPRESSION: No active disease. Electronically Signed   By: Agustin Cree M.D.   On: 05/17/2023 14:04    Review of Systems: As mentioned in the history of present illness and PMH. All other systems reviewed and are negative.   Blood pressure 124/87, pulse 79, temperature 98 F (36.7 C), temperature source Oral, resp. rate 18, height 5\' 9"  (1.753 m), weight 72.5 kg, SpO2 97%. Eyes: Pupils are equal, round, reactive to light. Extraocular motion is intact.  Right eye ptosis. Ears: Examination of the ears shows normal auricles and external auditory canals bilaterally.  Nose: Nasal examination shows congested nasal mucosa. Face: Facial examination shows no asymmetry. Palpation of the face elicit no significant tenderness.  Mouth: Oral cavity examination shows no mucosal lacerations. No significant trismus is noted.  His tongue is deviated to the left.  Unable to move his tongue to the right. Neck: Palpation of the neck reveals no lymphadenopathy or mass. The trachea is midline.   Procedure:  Flexible Nasal Endoscopy and nasopharyngoscopy:  Description: Risks, benefits, and alternatives of flexible endoscopy were explained to the patient.  Specific mention was made of the risk of throat numbness with difficulty swallowing, possible bleeding from the nose and mouth, and pain from the procedure.  The patient gave oral consent to proceed.  The flexible scope was inserted into the right nasal cavity.  Endoscopy of the interior nasal cavity, superior, inferior, and middle meatus was performed. The sphenoid-ethmoid recess was examined. Edematous mucosa was noted.  No polyp, mass, or lesion was appreciated. Nasal septal deviation noted.  A large nasopharyngeal soft tissue mass was noted.  Turbinates were hypertrophied but  without mass.  The procedure was repeated on the contralateral side with similar findings.  The patient tolerated the procedure well.    Assessment/Plan: The patient has a large right nasopharyngeal mass, concerning for malignancy. 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 -Chronic rhinitis with nasal mucosal congestion, nasal septal deviation, and bilateral inferior turbinate hypertrophy. -Plan biopsy of his nasopharyngeal mass in the operating room tomorrow morning at 11 AM. -N.p.o. after midnight.  [place] W Lexia Vandevender 05/18/2023, 10:39 AM

## 2023-05-18 NOTE — Evaluation (Signed)
Physical Therapy Evaluation Patient Details Name: Jordan Calderon MRN: [id number] DOB: 11-Feb-1961 Today's Date: 05/18/2023  History of Present Illness  62 yo male presents to [facility] on 11/27 with lightheadedness, R ear pain, R ptosis, HA, cough, n/v. 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 Recent ED visit 11/21 for syncope workup. PMH includes HIV on Hart therapy, HLD, depression, anxiety, AKI.  Clinical Impression   Pt presents with generalized weakness with more weakness RLE, impaired gait with narrow BOS and near-buckling RLE, impaired balance, room-spinning and unsteady-type dizziness with mobility, and decreased activity tolerance. Pt to benefit from acute PT to address deficits. Pt ambulated short distance, benefits from use of RW and PT steadying support. Pt states his feet feel numb, not sure how long this has been going on per pt. Pt also with R eye closing with gait and fatigue. PT to progress mobility as tolerated, and will continue to follow acutely.          If plan is discharge home, recommend the following: A little help with walking and/or transfers;A little help with bathing/dressing/bathroom   Can travel by private vehicle        Equipment Recommendations Rolling walker (2 wheels)  Recommendations for Other Services       Functional Status Assessment Patient has had a recent decline in their functional status and demonstrates the ability to make significant improvements in function in a reasonable and predictable amount of time.     Precautions / Restrictions Precautions Precautions: Fall Precaution Comments: R ocular headaches vs frontal headaches with mobility, tends to close R eye given pain Restrictions Weight Bearing Restrictions: No      Mobility  Bed Mobility Overal bed mobility: Needs Assistance Bed Mobility: Supine to Sit     Supine to sit: Supervision, HOB elevated, Used rails      General bed mobility comments: increased time, requiring time to steady at EOB given dizziness    Transfers Overall transfer level: Needs assistance Equipment used: Rolling walker (2 wheels), None Transfers: Sit to/from Stand Sit to Stand: Contact guard assist           General transfer comment: for safety, slow to rise and steady, additional time once standing given room-spinning and unsteady dizziness    Ambulation/Gait Ambulation/Gait assistance: Min assist Gait Distance (Feet): 90 Feet Assistive device: None, IV Pole, Rolling walker (2 wheels) Gait Pattern/deviations: Step-through pattern, Decreased stride length, Trunk flexed, Drifts right/left, Narrow base of support, Scissoring Gait velocity: decr     General Gait Details: assist to steady, periods of R knee buckling pt-corrected with use of RW or PT assist. Pt benefits from RW use for stability, initially not using AD and pt reaching for environment to self-steady (which pt states he has been doing at home for the past few weeks)  Stairs            Wheelchair Mobility     Tilt Bed    Modified Rankin (Stroke Patients Only)       Balance Overall balance assessment: Needs assistance, History of Falls (history of 1 recent fall, which pt calls a syncopal episode) Sitting-balance support: No upper extremity supported Sitting balance-Leahy Scale: Fair     Standing balance support: Bilateral upper extremity supported, During functional activity Standing balance-Leahy Scale: Poor Standing balance comment: reliant on external support at this time             High level balance activites: Direction  changes, Turns, Head turns High Level Balance Comments: unsteadiness and halting gait with horizontal head turns, directional changes             Pertinent Vitals/Pain Pain Assessment Pain Assessment: 0-10 Pain Score: 8  Pain Location: R side head Pain Descriptors / Indicators: Pounding, Headache Pain  Intervention(s): Limited activity within patient's tolerance, Monitored during session, Repositioned    Home Living Family/patient expects to be discharged to:: Private residence Living Arrangements: Alone Available Help at Discharge: Friend(s) Archie Patten or one of her boys") Type of Home: House Home Access: Stairs to enter   Secretary/administrator of Steps: 2   Home Layout: One level Home Equipment: None      Prior Function Prior Level of Function : Independent/Modified Independent             Mobility Comments: pt doesn't drive or have a car, states he has friends and a sister that help as needed. Mobility wise, pt is independent at baseline ADLs Comments: indep     Extremity/Trunk Assessment   Upper Extremity Assessment Upper Extremity Assessment: Defer to OT evaluation    Lower Extremity Assessment Lower Extremity Assessment: RLE deficits/detail;LLE deficits/detail RLE Deficits / Details: 4/5 knee flex/ext, 3+/5 hip flex/abd/add RLE Sensation: decreased light touch (pt endorses plantar surface of bilat feet are numb) RLE Coordination: decreased fine motor;decreased gross motor LLE Deficits / Details: 5/5 throughout LLE Sensation: decreased light touch    Cervical / Trunk Assessment Cervical / Trunk Assessment: Kyphotic  Communication   Communication Communication: No apparent difficulties Cueing Techniques: Verbal cues  Cognition Arousal: Alert Behavior During Therapy: Lability Overall Cognitive Status: Within Functional Limits for tasks assessed                                 General Comments: intermittent tearful during session, when discussing his late dog and new diagnoses. Pt follows commands well, mild increased processing time to follow commands on R        General Comments General comments (skin integrity, edema, etc.): vertical nystagmus with smooth pursuits superior and inferior    Exercises     Assessment/Plan    PT Assessment  Patient needs continued PT services  PT Problem List Decreased strength;Decreased mobility;Decreased safety awareness;Decreased activity tolerance;Decreased balance;Decreased knowledge of use of DME;Pain;Decreased knowledge of precautions       PT Treatment Interventions DME instruction;Therapeutic activities;Gait training;Therapeutic exercise;Patient/family education;Balance training;Stair training;Functional mobility training;Neuromuscular re-education    PT Goals (Current goals can be found in the Care Plan section)  Acute Rehab PT Goals Patient Stated Goal: home PT Goal Formulation: With patient Time For Goal Achievement: 06/01/23 Potential to Achieve Goals: Good    Frequency Min 1X/week     Co-evaluation               AM-PAC PT "6 Clicks" Mobility  Outcome Measure Help needed turning from your back to your side while in a flat bed without using bedrails?: A Little Help needed moving from lying on your back to sitting on the side of a flat bed without using bedrails?: A Little Help needed moving to and from a bed to a chair (including a wheelchair)?: A Little Help needed standing up from a chair using your arms (e.g., wheelchair or bedside chair)?: A Little Help needed to walk in hospital room?: A Little Help needed climbing 3-5 steps with a railing? : A Lot 6 Click Score: 17  End of Session   Activity Tolerance: Patient limited by fatigue;Other (comment) (dizziness) Patient left: in chair;with call bell/phone within reach;with chair alarm set;Other (comment) (MD at bedside) Nurse Communication: Mobility status PT Visit Diagnosis: Other abnormalities of gait and mobility (R26.89);Muscle weakness (generalized) (M62.81);Dizziness and giddiness (R42)    Time: 1610-9604 PT Time Calculation (min) (ACUTE ONLY): 31 min   Charges:   PT Evaluation $PT Eval Low Complexity: 1 Low PT Treatments $Therapeutic Activity: 8-22 mins PT General Charges $$ ACUTE PT VISIT: 1  Visit         Marye Round, PT DPT Acute Rehabilitation Services Secure Chat Preferred  Office [phone]   Shikira Folino E Stroup 05/18/2023, 9:22 AM

## 2023-05-18 NOTE — Progress Notes (Addendum)
[facility] PROGRESS NOTE    Progress Note  Jordan Calderon  OZH:[id number] DOB: [birth year] DOA: 05/17/2023 PCP: Pcp, No     Brief Narrative:   Jordan Calderon is an 62 y.o. male past medical history significant for HIV on [facility] therapy with a last CD4 count of 132 recent admission for syncopal episode and acute kidney injury during this admission he had a TEE that showed an EF of 60% no regional wall motion abnormality, was aggressively hydrated, CT of the head showed no acute findings.  Relates that since discharge he has been feeling weak having more headaches.  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  This is what probably causing his pain and vertigo. Concern about carcinoma.  Continue meclizine and Zofran. He relates his nausea and vomiting are better, but not resolved continues to have a lot of pain. Consult ENT and oncology.  HIV disease: Continue HAART therapy, continue dapsone.  Hypothyroidism: Continue Synthroid.  DVT prophylaxis: lovenox Family Communication:none Status is: Inpatient Remains inpatient appropriate because: nasopharengeal mass     Code Status:     Code Status Orders  (From admission, onward)           Start     Ordered   05/17/23 1905  Full code  Continuous       Question:  By:  Answer:  Consent: discussion documented in EHR   05/17/23 1906           Code Status History     Date Active Date Inactive Code Status Order ID Comments User Context   05/17/2023 1532 05/17/2023 1906 Full Code [id number]  Nolberto Hanlon, MD ED   05/11/2023 2024 05/12/2023 2308 Full Code [id number]  Hillary Bow, DO Inpatient   07/01/2018 1624 07/03/2018 1537 Full Code [id number]  Dorcas Carrow, MD Inpatient   02/10/2014 2054 02/12/2014 0031 Full  Code [id number]  Christen Bame, MD Inpatient      Advance Directive Documentation    Flowsheet Row Most Recent Value  Type of Advance Directive Healthcare Power of Attorney, Living will  Pre-existing out of facility DNR order (yellow form or pink MOST form) --  "MOST" Form in Place? --         IV Access:   Peripheral IV   Procedures and diagnostic studies:   MR ANGIO HEAD WO CONTRAST  Result Date: 05/17/2023 CLINICAL DATA:  Initial evaluation for persistent dizziness. EXAM: MRA HEAD WITHOUT CONTRAST TECHNIQUE: Angiographic images of the Circle of Willis were acquired using MRA technique without intravenous contrast. COMPARISON:  CTA from earlier the same day. FINDINGS: Anterior circulation: Both internal carotid arteries are widely patent through the siphons without stenosis or other abnormality. Again seen is a 3 mm outpouching extending posteriorly and laterally from the left ICA terminus (series 2, image 93). Possible small vessel emanating from its apex, not entirely certain. Finding could reflect an vascular infundibulum versus aneurysm. A1 segments patent bilaterally, with the right dominant. Normal anterior communicating artery complex. Anterior cerebral arteries patent without stenosis. No M1 stenosis or occlusion. Distal MCA branches perfused and symmetric. Posterior circulation: Visualized V4 segments patent without stenosis. Right PICA patent. Left PICA origin not seen. Basilar patent without stenosis. Superior cerebellar and posterior cerebral arteries widely patent bilaterally. Anatomic variants: As above. Other:  Previously identified mass at the right skull base noted, partially visualized, better evaluated on prior CT. IMPRESSION: 1. 3 mm outpouching extending posteriorly and laterally from the left ICA terminus, potentially reflecting an vascular infundibulum versus aneurysm. Attention at follow-up recommended. 2. Otherwise normal intracranial MRA. No large vessel occlusion or  other emergent finding. Electronically Signed   By: Rise Mu M.D.   On: 05/17/2023 20:58   CT ANGIO HEAD NECK W WO CM  Result Date: 05/17/2023 CLINICAL DATA:  Cerebral vasospasm suspected EXAM: CT ANGIOGRAPHY HEAD AND NECK WITH AND WITHOUT CONTRAST TECHNIQUE: Multidetector CT imaging of the head and neck was performed using the standard protocol during bolus administration of intravenous contrast. Multiplanar CT image reconstructions and MIPs were obtained to evaluate the vascular anatomy. Carotid stenosis measurements (when applicable) are obtained utilizing NASCET criteria, using the distal internal carotid diameter as the denominator. RADIATION DOSE REDUCTION: This exam was performed according to the departmental dose-optimization program which includes automated exposure control, adjustment of the mA and/or kV according to patient size and/or use of iterative reconstruction technique. CONTRAST:  75mL OMNIPAQUE IOHEXOL 350 MG/ML SOLN COMPARISON:  None Available. FINDINGS: CT HEAD FINDINGS Brain: No evidence of acute infarction, hemorrhage, hydrocephalus, extra-axial collection or intraparenchymal mass lesion/mass effect. Vascular: See below. Skull: No acute fracture. Sinuses/Orbits: Clear sinuses.  No acute orbital findings. Other: No mastoid effusions. Review of the MIP images confirms the above findings CTA NECK FINDINGS Aortic arch: Great vessel origins are patent without significant stenosis. Right carotid system: No evidence of dissection, stenosis (50% or greater), or occlusion. Left carotid system: No evidence of dissection, stenosis (50% or greater), or occlusion. Mild atherosclerosis at the carotid bifurcation. Vertebral arteries: Codominant. No evidence of dissection, stenosis (50% or greater), or occlusion. Skeleton: No acute abnormality on limited assessment. Other neck: Large right nasopharyngeal/skull base mass, suboptimally evaluated on this arterially timed study Upper chest:  Emphysema. Review of the MIP images confirms the above findings CTA HEAD FINDINGS Anterior circulation: Bilateral intracranial ICAs, MCAs, and ACAs are patent without proximal hemodynamically significant stenosis. Hypoplastic left A1 ACA, likely congenital. Approximately 2 mm posteriorly inferiorly directed outpouching arising from the left carotid terminus (for example see series 605, image 111 and series 604, image 154 Posterior circulation: Tortuous vertebral arteries. The vertebral arteries, basilar artery and bilateral posterior cerebral arteries are patent without proximal hemodynamically significant stenosis. Venous sinuses: As permitted by contrast timing, patent. Anatomic variants: Described above. Review of the MIP images confirms the above findings IMPRESSION: 1. 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 Recommend ENT consultation, correlation with direct inspection, and follow-up dedicated CT of the neck with contrast for better characterization. 2. No emergent large vessel occlusion or proximal hemodynamically significant stenosis. 3. Approximately 2 mm posteriorly inferiorly directed outpouching arising from the left carotid terminus which could represent an infundibulum with vessel not well seen versus aneurysm. 4. Aortic Atherosclerosis (ICD10-I70.0) and Emphysema (ICD10-J43.9). Electronically Signed   By: Feliberto Harts M.D.   On: 05/17/2023 14:55   DG Chest Port 1 View  Result Date: 05/17/2023 CLINICAL DATA:  Worsening cough and dry heaving with shortness of breath EXAM: PORTABLE CHEST 1 VIEW COMPARISON:  Chest radiograph dated 05/11/2023 FINDINGS: Normal lung volumes. No focal consolidations. No pleural effusion or pneumothorax. The heart size and mediastinal contours are within normal limits. No acute osseous abnormality. IMPRESSION: No active disease. Electronically Signed   By: Milus Height.D.  On: 05/17/2023 14:04     Medical Consultants:   None.   Subjective:    Jordan Calderon relates still in pain nausea is improved but not resolved.  He relates he still feels miserable  Objective:    Vitals:   05/17/23 1736 05/17/23 1947 05/17/23 2340 05/18/23 0331  BP: 122/87 137/77 112/72 120/84  Pulse: 82 79 84 77  Resp: 18 14 16 18   Temp: 98.4 F (36.9 C) 99.6 F (37.6 C) 98.3 F (36.8 C) 98.8 F (37.1 C)  TempSrc: Oral Oral Oral Oral  SpO2: 96% 96% 95% 97%  Weight: 72.5 kg     Height: 5\' 9"  (1.753 m)      SpO2: 97 %   Intake/Output Summary (Last 24 hours) at 05/18/2023 0641 Last data filed at 05/18/2023 1610 Gross per 24 hour  Intake 1750.79 ml  Output 700 ml  Net 1050.79 ml   Filed Weights   05/17/23 1157 05/17/23 1736  Weight: 74.6 kg 72.5 kg    Exam: General exam: In no acute distress. Respiratory system: Good air movement and clear to auscultation. Cardiovascular system: S1 & S2 heard, RRR. No JVD. Gastrointestinal system: Abdomen is nondistended, soft and nontender.  Extremities: No pedal edema. Skin: No rashes, lesions or ulcers Psychiatry: Judgement and insight appear normal. Mood & affect appropriate.    Data Reviewed:    Labs: Basic Metabolic Panel: Recent Labs  Lab 05/11/23 1135 05/11/23 1331 05/12/23 0627 05/17/23 1232 05/17/23 1741 05/18/23 0452  NA 132*  --  132* 135  --  132*  K 3.7  --  4.1 4.3  --  4.1  CL 102  --  104 99  --  104  CO2 20*  --  22 25  --  22  GLUCOSE 155*  --  88 110*  --  103*  BUN 27*  --  25* 20  --  17  CREATININE 1.58*  --  1.12 1.17 1.15 1.08  CALCIUM 9.1  --  8.6* 9.9  --  8.4*  MG  --  2.2  --   --   --   --    GFR Estimated Creatinine Clearance: 70 mL/min (by C-G formula based on SCr of 1.08 mg/dL). Liver Function Tests: Recent Labs  Lab 05/11/23 1135 05/18/23 0452  AST 35 24  ALT 33 26  ALKPHOS 111 87  BILITOT 1.2* 0.5  PROT 8.2* 7.0  ALBUMIN 4.1 3.1*   No results for  input(s): "LIPASE", "AMYLASE" in the last 168 hours. No results for input(s): "AMMONIA" in the last 168 hours. Coagulation profile No results for input(s): "INR", "PROTIME" in the last 168 hours. COVID-19 Labs  No results for input(s): "DDIMER", "FERRITIN", "LDH", "CRP" in the last 72 hours.  Lab Results  Component Value Date   SARSCOV2NAA NEGATIVE 05/11/2023    CBC: Recent Labs  Lab 05/11/23 1135 05/12/23 0627 05/17/23 1232 05/17/23 1741 05/18/23 0452  WBC 12.3* 9.4 8.7 9.6 6.9  NEUTROABS 6.7  --   --   --   --   HGB 13.0 11.7* 10.2* 9.8* 9.1*  HCT 37.7* 35.0* 30.3* 28.9* 27.2*  MCV 94.3 96.7 96.2 95.1 96.5  PLT 325 177 261 250 215   Cardiac Enzymes: No results for input(s): "CKTOTAL", "CKMB", "CKMBINDEX", "TROPONINI" in the last 168 hours. BNP (last 3 results) No results for input(s): "PROBNP" in the last 8760 hours. CBG: Recent Labs  Lab 05/11/23 1113 05/12/23 0556  GLUCAP 161* 89   D-Dimer:  No results for input(s): "DDIMER" in the last 72 hours. Hgb A1c: Recent Labs    05/17/23 1741  HGBA1C 4.2*   Lipid Profile: Recent Labs    05/18/23 0452  CHOL 117  HDL 20*  LDLCALC 74  TRIG 161  CHOLHDL 5.9   Thyroid function studies: No results for input(s): "TSH", "T4TOTAL", "T3FREE", "THYROIDAB" in the last 72 hours.  Invalid input(s): "FREET3" Anemia work up: No results for input(s): "VITAMINB12", "FOLATE", "FERRITIN", "TIBC", "IRON", "RETICCTPCT" in the last 72 hours. Sepsis Labs: Recent Labs  Lab 05/11/23 1135 05/11/23 1322 05/12/23 0627 05/17/23 1232 05/17/23 1741 05/18/23 0452  WBC 12.3*  --  9.4 8.7 9.6 6.9  LATICACIDVEN 3.6* 1.4  --   --   --   --    Microbiology Recent Results (from the past 240 hour(s))  Group A Strep by PCR     Status: None   Collection Time: 05/11/23 10:35 AM   Specimen: Throat; Sterile Swab  Result Value Ref Range Status   Group A Strep by PCR NOT DETECTED NOT DETECTED Final    Comment: Performed at [facility], [address] [place], [place] [address]  Resp panel by RT-PCR (RSV, Flu A&B, Covid) Throat     Status: None   Collection Time: 05/11/23 10:36 AM   Specimen: Throat; Nasal Swab  Result Value Ref Range Status   SARS Coronavirus 2 by RT PCR NEGATIVE NEGATIVE Final    Comment: (NOTE) SARS-CoV-2 target nucleic acids are NOT DETECTED.  The SARS-CoV-2 RNA is generally detectable in upper respiratory specimens during the acute phase of infection. The lowest concentration of SARS-CoV-2 viral copies this assay can detect is 138 copies/mL. A negative result does not preclude SARS-Cov-2 infection and should not be used as the sole basis for treatment or other patient management decisions. A negative result may occur with  improper specimen collection/handling, submission of specimen other than nasopharyngeal swab, presence of viral mutation(s) within the areas targeted by this assay, and inadequate number of viral copies(<138 copies/mL). A negative result must be combined with clinical observations, patient history, and epidemiological information. The expected result is Negative.  Fact Sheet for Patients:  BloggerCourse.com  Fact Sheet for Healthcare Providers:  SeriousBroker.it  This test is no t yet approved or cleared by the [place] FDA and  has been authorized for detection and/or diagnosis of SARS-CoV-2 by FDA under an Emergency Use Authorization (EUA). This EUA will remain  in effect (meaning this test can be used) for the duration of the COVID-19 declaration under Section 564(b)(1) of the Act, 21 U.S.C.section 360bbb-3(b)(1), unless the authorization is terminated  or revoked sooner.       Influenza A by PCR NEGATIVE NEGATIVE Final   Influenza B by PCR NEGATIVE NEGATIVE Final    Comment: (NOTE) The Xpert Xpress SARS-CoV-2/FLU/RSV plus assay is intended as an aid in the diagnosis of influenza from  Nasopharyngeal swab specimens and should not be used as a sole basis for treatment. Nasal washings and aspirates are unacceptable for Xpert Xpress SARS-CoV-2/FLU/RSV testing.  Fact Sheet for Patients: BloggerCourse.com  Fact Sheet for Healthcare Providers: SeriousBroker.it  This test is not yet approved or cleared by the [place] FDA and has been authorized for detection and/or diagnosis of SARS-CoV-2 by FDA under an Emergency Use Authorization (EUA). This EUA will remain in effect (meaning this test can be used) for the duration of the COVID-19 declaration under Section 564(b)(1) of the Act, 21 U.S.C.  section 360bbb-3(b)(1), unless the authorization is terminated or revoked.     Resp Syncytial Virus by PCR NEGATIVE NEGATIVE Final    Comment: (NOTE) Fact Sheet for Patients: BloggerCourse.com  Fact Sheet for Healthcare Providers: SeriousBroker.it  This test is not yet approved or cleared by the [place] FDA and has been authorized for detection and/or diagnosis of SARS-CoV-2 by FDA under an Emergency Use Authorization (EUA). This EUA will remain in effect (meaning this test can be used) for the duration of the COVID-19 declaration under Section 564(b)(1) of the Act, 21 U.S.C. section 360bbb-3(b)(1), unless the authorization is terminated or revoked.  Performed at [facility], [address] [place], [place] [address]   Respiratory (~20 pathogens) panel by PCR     Status: None   Collection Time: 05/11/23 10:48 PM   Specimen: Nasopharyngeal Swab; Respiratory  Result Value Ref Range Status   Adenovirus NOT DETECTED NOT DETECTED Final   Coronavirus 229E NOT DETECTED NOT DETECTED Final    Comment: (NOTE) The Coronavirus on the Respiratory Panel, DOES NOT test for the novel  Coronavirus (2019 nCoV)    Coronavirus HKU1 NOT DETECTED NOT DETECTED Final    Coronavirus NL63 NOT DETECTED NOT DETECTED Final   Coronavirus OC43 NOT DETECTED NOT DETECTED Final   Metapneumovirus NOT DETECTED NOT DETECTED Final   Rhinovirus / Enterovirus NOT DETECTED NOT DETECTED Final   Influenza A NOT DETECTED NOT DETECTED Final   Influenza B NOT DETECTED NOT DETECTED Final   Parainfluenza Virus 1 NOT DETECTED NOT DETECTED Final   Parainfluenza Virus 2 NOT DETECTED NOT DETECTED Final   Parainfluenza Virus 3 NOT DETECTED NOT DETECTED Final   Parainfluenza Virus 4 NOT DETECTED NOT DETECTED Final   Respiratory Syncytial Virus NOT DETECTED NOT DETECTED Final   Bordetella pertussis NOT DETECTED NOT DETECTED Final   Bordetella Parapertussis NOT DETECTED NOT DETECTED Final   Chlamydophila pneumoniae NOT DETECTED NOT DETECTED Final   Mycoplasma pneumoniae NOT DETECTED NOT DETECTED Final    Comment: Performed at [facility], [address], [place], [place] [address]     Medications:    calcium carbonate  1 tablet Oral TID WC   dapsone  100 mg Oral Q breakfast   Darunavir-Cobicistat-Emtricitabine-Tenofovir Alafenamide  1 tablet Oral Q breakfast   enoxaparin (LOVENOX) injection  40 mg Subcutaneous Q24H   Continuous Infusions:  dextrose 5 % and 0.9 % NaCl 40 mL/hr at 05/18/23 0611      LOS: 1 day   Marinda Elk  [facility]  05/18/2023, 6:41 AM

## 2023-05-18 NOTE — Progress Notes (Signed)
SLP Cancellation Note  Patient Details Name: Jordan Calderon MRN: [id number] DOB: December 06, 1960   Cancelled treatment:       Reason Eval/Treat Not Completed: Other (comment). Pt being worked up for potential head and neck cancer. No need for cognitive eval, but will keep on caseload for needs after ENT consult. Unsure of plan at this time.    Corinthian Mizrahi, Riley Nearing 05/18/2023, 10:18 AM

## 2023-05-19 ENCOUNTER — Other Ambulatory Visit: Payer: Self-pay

## 2023-05-19 ENCOUNTER — Encounter (HOSPITAL_COMMUNITY)
Admission: EM | Disposition: A | Discharge status: Home Health | Payer: Self-pay | Source: Home / Self Care | Attending: Internal Medicine

## 2023-05-19 ENCOUNTER — Encounter (HOSPITAL_COMMUNITY): Payer: Self-pay | Admitting: Internal Medicine

## 2023-05-19 ENCOUNTER — Inpatient Hospital Stay (HOSPITAL_COMMUNITY): Payer: Medicare Other | Admitting: Anesthesiology

## 2023-05-19 DIAGNOSIS — D106 Benign neoplasm of nasopharynx: Secondary | ICD-10-CM | POA: Diagnosis not present

## 2023-05-19 DIAGNOSIS — J392 Other diseases of pharynx: Secondary | ICD-10-CM

## 2023-05-19 DIAGNOSIS — R42 Dizziness and giddiness: Secondary | ICD-10-CM | POA: Diagnosis not present

## 2023-05-19 HISTORY — PX: NASOPHARYNGEAL BIOPSY: SHX6488

## 2023-05-19 SURGERY — BIOPSY, NASOPHARYNX
Anesthesia: General | Site: Nose

## 2023-05-19 MED ORDER — MIDAZOLAM HCL 2 MG/2ML IJ SOLN
INTRAMUSCULAR | Status: DC | PRN
  Administered 2023-05-19: 2 mg via INTRAVENOUS

## 2023-05-19 MED ORDER — OXYCODONE HCL 5 MG/5ML PO SOLN: 5.0000 mg | Freq: Once | ORAL | Status: DC | PRN

## 2023-05-19 MED ORDER — CEFAZOLIN SODIUM-DEXTROSE 2-4 GM/100ML-% IV SOLN
2.0000 g | Freq: Once | INTRAVENOUS | Status: AC
  Administered 2023-05-19: 2 g via INTRAVENOUS
  Filled 2023-05-19: qty 100

## 2023-05-19 MED ORDER — ONDANSETRON HCL 4 MG/2ML IJ SOLN
INTRAMUSCULAR | Status: AC
  Filled 2023-05-19: qty 2

## 2023-05-19 MED ORDER — LIDOCAINE-EPINEPHRINE 1 %-1:100000 IJ SOLN
INTRAMUSCULAR | Status: AC
  Filled 2023-05-19: qty 1

## 2023-05-19 MED ORDER — AMISULPRIDE (ANTIEMETIC) 5 MG/2ML IV SOLN
INTRAVENOUS | Status: AC
  Filled 2023-05-19: qty 4

## 2023-05-19 MED ORDER — OXYMETAZOLINE HCL 0.05 % NA SOLN
NASAL | Status: DC | PRN
  Administered 2023-05-19: 1

## 2023-05-19 MED ORDER — FENTANYL CITRATE (PF) 100 MCG/2ML IJ SOLN: 25.0000 ug | INTRAMUSCULAR | Status: DC | PRN

## 2023-05-19 MED ORDER — ORAL CARE MOUTH RINSE: 15.0000 mL | Freq: Once | OROMUCOSAL | Status: AC

## 2023-05-19 MED ORDER — SUCCINYLCHOLINE CHLORIDE 200 MG/10ML IV SOSY
PREFILLED_SYRINGE | INTRAVENOUS | Status: DC | PRN
  Administered 2023-05-19: 100 mg via INTRAVENOUS

## 2023-05-19 MED ORDER — PROPOFOL 10 MG/ML IV BOLUS
INTRAVENOUS | Status: DC | PRN
  Administered 2023-05-19: 100 mg via INTRAVENOUS

## 2023-05-19 MED ORDER — CHLORHEXIDINE GLUCONATE 0.12 % MT SOLN
15.0000 mL | Freq: Once | OROMUCOSAL | Status: AC
  Administered 2023-05-19: 15 mL via OROMUCOSAL
  Filled 2023-05-19: qty 15

## 2023-05-19 MED ORDER — BACITRACIN ZINC 500 UNIT/GM EX OINT
TOPICAL_OINTMENT | CUTANEOUS | Status: AC
  Filled 2023-05-19: qty 28.35

## 2023-05-19 MED ORDER — LIDOCAINE 2% (20 MG/ML) 5 ML SYRINGE
INTRAMUSCULAR | Status: DC | PRN
  Administered 2023-05-19: 100 mg via INTRAVENOUS

## 2023-05-19 MED ORDER — PROPOFOL 10 MG/ML IV BOLUS
INTRAVENOUS | Status: AC
  Filled 2023-05-19: qty 20

## 2023-05-19 MED ORDER — FENTANYL CITRATE (PF) 250 MCG/5ML IJ SOLN
INTRAMUSCULAR | Status: DC | PRN
  Administered 2023-05-19 (×2): 50 ug via INTRAVENOUS

## 2023-05-19 MED ORDER — OXYCODONE HCL 5 MG PO TABS: 5.0000 mg | ORAL_TABLET | Freq: Once | ORAL | Status: DC | PRN

## 2023-05-19 MED ORDER — 0.9 % SODIUM CHLORIDE (POUR BTL) OPTIME
TOPICAL | Status: DC | PRN
  Administered 2023-05-19: 1000 mL

## 2023-05-19 MED ORDER — DEXAMETHASONE SODIUM PHOSPHATE 10 MG/ML IJ SOLN
INTRAMUSCULAR | Status: AC
  Filled 2023-05-19: qty 1

## 2023-05-19 MED ORDER — OXYMETAZOLINE HCL 0.05 % NA SOLN
NASAL | Status: AC
  Filled 2023-05-19: qty 30

## 2023-05-19 MED ORDER — MIDAZOLAM HCL 2 MG/2ML IJ SOLN
INTRAMUSCULAR | Status: AC
  Filled 2023-05-19: qty 2

## 2023-05-19 MED ORDER — AMISULPRIDE (ANTIEMETIC) 5 MG/2ML IV SOLN
10.0000 mg | Freq: Once | INTRAVENOUS | Status: AC
  Administered 2023-05-19: 10 mg via INTRAVENOUS

## 2023-05-19 MED ORDER — FENTANYL CITRATE (PF) 250 MCG/5ML IJ SOLN
INTRAMUSCULAR | Status: AC
  Filled 2023-05-19: qty 5

## 2023-05-19 MED ORDER — LACTATED RINGERS IV SOLN
INTRAVENOUS | Status: DC
Start: 2023-05-19 — End: 2023-05-19

## 2023-05-19 MED ORDER — ONDANSETRON HCL 4 MG/2ML IJ SOLN
4.0000 mg | Freq: Once | INTRAMUSCULAR | Status: AC | PRN
  Administered 2023-05-19: 4 mg via INTRAVENOUS

## 2023-05-19 MED ORDER — DEXAMETHASONE SODIUM PHOSPHATE 10 MG/ML IJ SOLN
INTRAMUSCULAR | Status: DC | PRN
  Administered 2023-05-19: 10 mg via INTRAVENOUS

## 2023-05-19 SURGICAL SUPPLY — 25 items
BAG COUNTER SPONGE SURGICOUNT (BAG) ×1 IMPLANT
CANISTER SUCT 3000ML PPV (MISCELLANEOUS) ×1 IMPLANT
COAGULATOR SUCT SWTCH 10FR 6 (ELECTROSURGICAL) ×1 IMPLANT
COVER SURGICAL LIGHT HANDLE (MISCELLANEOUS) IMPLANT
DEFOGGER MIRROR 1QT (MISCELLANEOUS) IMPLANT
DRAPE HALF SHEET 40X57 (DRAPES) IMPLANT
ELECT REM PT RETURN 9FT ADLT (ELECTROSURGICAL) ×1
ELECTRODE REM PT RTRN 9FT ADLT (ELECTROSURGICAL) ×1 IMPLANT
GAUZE SPONGE 2X2 8PLY STRL LF (GAUZE/BANDAGES/DRESSINGS) ×1 IMPLANT
GLOVE ECLIPSE 7.5 STRL STRAW (GLOVE) ×1 IMPLANT
GOWN STRL REUS W/ TWL LRG LVL3 (GOWN DISPOSABLE) ×2 IMPLANT
KIT BASIN OR (CUSTOM PROCEDURE TRAY) ×1 IMPLANT
KIT TURNOVER KIT B (KITS) ×1 IMPLANT
NDL HYPO 25GX1X1/2 BEV (NEEDLE) ×1 IMPLANT
NEEDLE HYPO 25GX1X1/2 BEV (NEEDLE) ×1 IMPLANT
NS IRRIG 1000ML POUR BTL (IV SOLUTION) ×1 IMPLANT
PAD ARMBOARD 7.5X6 YLW CONV (MISCELLANEOUS) ×2 IMPLANT
SPLINT NASAL DOYLE BI-VL (GAUZE/BANDAGES/DRESSINGS) ×1 IMPLANT
SPONGE NEURO XRAY DETECT 1X3 (DISPOSABLE) ×1 IMPLANT
SUT CHROMIC 4 0 SH 27 (SUTURE) ×1 IMPLANT
SUT PLAIN 4 0 ~~LOC~~ 1 (SUTURE) ×1 IMPLANT
SUT PROLENE 3 0 PS 2 (SUTURE) ×1 IMPLANT
TRAY ENT MC OR (CUSTOM PROCEDURE TRAY) ×1 IMPLANT
TUBE SALEM SUMP 16F (TUBING) IMPLANT
TUBING EXTENTION W/L.L. (IV SETS) IMPLANT

## 2023-05-19 NOTE — Anesthesia Procedure Notes (Signed)
Procedure Name: Intubation Date/Time: 05/19/2023 11:33 AM  Performed by: Susy Manor, CRNAPre-anesthesia Checklist: Patient identified, Emergency Drugs available, Suction available and Patient being monitored Patient Re-evaluated:Patient Re-evaluated prior to induction Oxygen Delivery Method: Circle System Utilized Preoxygenation: Pre-oxygenation with 100% oxygen Induction Type: IV induction Ventilation: Mask ventilation without difficulty Laryngoscope Size: Mac and 3 Grade View: Grade II Tube type: Oral Tube size: 7.5 mm Number of attempts: 1 Airway Equipment and Method: Stylet and Oral airway Placement Confirmation: ETT inserted through vocal cords under direct vision, positive ETCO2 and breath sounds checked- equal and bilateral Tube secured with: Tape Dental Injury: Teeth and Oropharynx as per pre-operative assessment

## 2023-05-19 NOTE — Transfer of Care (Signed)
Immediate Anesthesia Transfer of Care Note  Patient: Ellis Parents  Procedure(s) Performed: NASOPHARYNGEAL BIOPSY (Nose)  Patient Location: PACU  Anesthesia Type:General  Level of Consciousness: awake, alert , and oriented  Airway & Oxygen Therapy: Patient Spontanous Breathing and Patient connected to face mask oxygen  Post-op Assessment: Report given to RN and Post -op Vital signs reviewed and stable  Post vital signs: Reviewed and stable  Last Vitals:  Vitals Value Taken Time  BP 119/65 05/19/23 1200  Temp    Pulse 100 05/19/23 1204  Resp 19 05/19/23 1204  SpO2 97 % 05/19/23 1204  Vitals shown include unfiled device data.  Last Pain:  Vitals:   05/19/23 0820  TempSrc: Oral  PainSc: 0-No pain      Patients Stated Pain Goal: 0 (05/18/23 1952)  Complications: No notable events documented.

## 2023-05-19 NOTE — Progress Notes (Signed)
Patient A/Ox4 and stable at time of transport to OR.

## 2023-05-19 NOTE — Plan of Care (Signed)
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  Attempted to see the patient today but patient was in the OR.  Previously I discussed recommendations with Dr. Robb Matar.  Plan is to obtain CT chest with contrast to help with staging.  Also recommended EBV DNA.  Patient will need PET scan in the outpatient setting for staging.  Additional recommendations to follow once biopsy is resulted.  Radiation oncology will need to be involved at that time as well, most likely in the outpatient setting.  Rest of the management is as per primary team and ENT.

## 2023-05-19 NOTE — Progress Notes (Addendum)
CCC Pre-op Review  Pre-op checklist: Completed  NPO: has been NPO  Labs: PCR N, NA 132  Consent: No orders  H&P: Dr Suszanne Conners 11/28  Vitals: Elevated BP, Elevated Respirations  O2 requirements: RA  MAR/PTA review: last dose lovenox 11/28 1714  IV: 20g  Floor nurse name:  Juluis Mire, RN   Additional info:   HIV+

## 2023-05-19 NOTE — Anesthesia Postprocedure Evaluation (Signed)
Anesthesia Post Note  Patient: Jordan Calderon  Procedure(s) Performed: NASOPHARYNGEAL BIOPSY (Nose)     Patient location during evaluation: PACU Anesthesia Type: General Level of consciousness: awake and alert and oriented Pain management: pain level controlled Vital Signs Assessment: post-procedure vital signs reviewed and stable Respiratory status: spontaneous breathing, nonlabored ventilation and respiratory function stable Cardiovascular status: blood pressure returned to baseline and stable Postop Assessment: no apparent nausea or vomiting Anesthetic complications: no   No notable events documented.  Last Vitals:  Vitals:   05/19/23 1230 05/19/23 1245  BP: 128/83 (!) 115/100  Pulse: 82 75  Resp: 19 18  Temp:    SpO2: 96% 94%    Last Pain:  Vitals:   05/19/23 1230  TempSrc:   PainSc: 0-No pain                 Arneda Sappington A.

## 2023-05-19 NOTE — Op Note (Signed)
DATE OF PROCEDURE:  05/19/2023                              OPERATIVE REPORT  SURGEON:  Newman Pies, MD  PREOPERATIVE DIAGNOSES: 1.  Right nasopharyngeal mass  POSTOPERATIVE DIAGNOSES: 1.  Right nasopharyngeal mass  PROCEDURE PERFORMED: Biopsy of right nasopharyngeal mass  ANESTHESIA:  General endotracheal tube anesthesia.  COMPLICATIONS:  None.  ESTIMATED BLOOD LOSS:  Minimal.  INDICATION FOR PROCEDURE:  Jordan Calderon is a 62 y.o. male who was recently noted to have a large right nasopharyngeal mass.  The patient has been complaining of right nasal obstruction and headaches.  He was also noted to have right eye ptosis and right hypoglossal nerve paralysis.  Based on the above findings, the decision was made for the patient to undergo the biopsy procedure.  The risks, benefits, alternatives, and details of the procedure were discussed with the patient.  Questions were invited and answered.  Informed consent was obtained.  DESCRIPTION:  The patient was taken to the operating room and placed supine on the operating table.  General endotracheal tube anesthesia was administered by the anesthesiologist.  The patient was positioned and prepped and draped in a standard fashion for nasopharyngeal biopsy.  Both nasal cavities were decongested with Afrin-soaked pledgets.  The pledgets were subsequently removed.  Using a 0 degree endoscope, the right nasal cavity and nasopharynx were visualized.  The soft tissue mass was noted within the nasopharynx.  5 large biopsy specimens were obtained from the nasopharyngeal mass using a cup forceps.  The specimens were sent fresh to the pathology department for histologic identification. The care of the patient was turned over to the anesthesiologist.  The patient was awakened from anesthesia without difficulty.  The patient was extubated and transferred to the recovery room in good condition.  OPERATIVE FINDINGS: Right nasopharyngeal mass.  SPECIMEN: Right  nasopharyngeal mass biopsy specimens.  FOLLOWUP CARE:  The patient will be returned to his hospital bed.  [name] 05/19/2023 11:57 AM

## 2023-05-19 NOTE — Progress Notes (Signed)
Patient back from OR. Patient A/Ox4 and stable at this time.

## 2023-05-19 NOTE — Evaluation (Signed)
Occupational Therapy Evaluation Patient Details Name: Jordan Calderon MRN: [id number] DOB: [birth year] Today's Date: 05/19/2023   History of Present Illness 62 yo male presents to [facility] on 11/27 with lightheadedness, R ear pain, R ptosis, HA, cough, n/v. 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 Recent ED visit 11/21 for syncope workup. PMH includes HIV on Hart therapy, HLD, depression, anxiety, AKI.   Clinical Impression   Kailan was evaluated s/p the above admission list. He lives alone and is indep at baseline. Upon evaluation the pt was limited by R sided headache pain, incoordinated movements, impaired cognition, scissoring gait, impaired balance. Overall he needed up to CGA for transfers and mobility with heavy reliance on RW. Due to the deficits listed below the pt also needs up to min A for LB ADLs and set up A for UB ADLs. Of note, pt intermittently closes or covers R eye throughout session, he denies diplopia and vision screen assessment was [facility]. Pt will benefit from continued acute OT services and HHOT.        If plan is discharge home, recommend the following: A little help with walking and/or transfers;A little help with bathing/dressing/bathroom;Assistance with cooking/housework;Direct supervision/assist for medications management;Direct supervision/assist for financial management;Assist for transportation;Help with stairs or ramp for entrance    Functional Status Assessment  Patient has had a recent decline in their functional status and demonstrates the ability to make significant improvements in function in a reasonable and predictable amount of time.  Equipment Recommendations  Other (comment);Tub/shower seat (RW)       Precautions / Restrictions Precautions Precautions: Fall Precaution Comments: R ocular headaches vs frontal headaches with mobility, tends to close R eye given pain Restrictions Weight Bearing  Restrictions: No      Mobility Bed Mobility Overal bed mobility: Needs Assistance Bed Mobility: Supine to Sit     Supine to sit: Supervision, HOB elevated, Used rails          Transfers Overall transfer level: Needs assistance Equipment used: Rolling walker (2 wheels), None Transfers: Sit to/from Stand Sit to Stand: Contact guard assist           General transfer comment: scissoring gait      Balance Overall balance assessment: Needs assistance Sitting-balance support: Feet supported Sitting balance-Leahy Scale: Fair     Standing balance support: Single extremity supported, During functional activity Standing balance-Leahy Scale: Fair Standing balance comment: statically                           ADL either performed or assessed with clinical judgement   ADL Overall ADL's : Needs assistance/impaired Eating/Feeding: NPO   Grooming: Contact guard assist;Standing   Upper Body Bathing: Set up;Sitting   Lower Body Bathing: Minimal assistance;Sit to/from stand   Upper Body Dressing : Set up;Sitting   Lower Body Dressing: Minimal assistance;Sit to/from stand   Toilet Transfer: Contact guard assist;Ambulation;Rolling walker (2 wheels)   Toileting- Clothing Manipulation and Hygiene: Contact guard assist;Sit to/from stand       Functional mobility during ADLs: Contact guard assist;Rolling walker (2 wheels) General ADL Comments: scissoring gait, mildly incoordinated UEs, holding R eye closed intermittently throughout     Vision Baseline Vision/History: 1 Wears glasses Vision Assessment?: No apparent visual deficits     Perception Perception: Not tested       Praxis Praxis: Not tested       Pertinent Vitals/Pain Pain Assessment  Pain Assessment: 0-10 Pain Score: 8  Pain Location: R side head Pain Descriptors / Indicators: Pounding, Headache Pain Intervention(s): Limited activity within patient's tolerance, Monitored during session      Extremity/Trunk Assessment Upper Extremity Assessment Upper Extremity Assessment: RUE deficits/detail;LUE deficits/detail RUE Deficits / Details: WFL. mildly incoordinated LUE Deficits / Details: WFL. mildly incoordinated   Lower Extremity Assessment Lower Extremity Assessment: Defer to PT evaluation   Cervical / Trunk Assessment Cervical / Trunk Assessment: Kyphotic   Communication Communication Communication: No apparent difficulties Cueing Techniques: Verbal cues   Cognition Arousal: Alert Behavior During Therapy: Lability Overall Cognitive Status: Within Functional Limits for tasks assessed               General Comments  VSS     Home Living Family/patient expects to be discharged to:: Private residence Living Arrangements: Alone Available Help at Discharge: Friend(s) Type of Home: House Home Access: Stairs to enter Secretary/administrator of Steps: 2   Home Layout: One level     Bathroom Shower/Tub: Chief Strategy Officer: Standard     Home Equipment: None          Prior Functioning/Environment Prior Level of Function : Independent/Modified Independent             Mobility Comments: pt doesn't drive or have a car, states he has friends and a sister that help as needed. Mobility wise, pt is independent at baseline ADLs Comments: indep        OT Problem List: Decreased strength;Decreased range of motion;Decreased activity tolerance;Impaired balance (sitting and/or standing);Decreased safety awareness;Decreased cognition;Decreased knowledge of precautions;Decreased knowledge of use of DME or AE      OT Treatment/Interventions: Self-care/ADL training;Therapeutic exercise;DME and/or AE instruction;Therapeutic activities;Patient/family education;Balance training    OT Goals(Current goals can be found in the care plan section) Acute Rehab OT Goals Patient Stated Goal: to eat OT Goal Formulation: With patient Time For Goal  Achievement: 06/02/23 Potential to Achieve Goals: Good ADL Goals Pt Will Perform Grooming: with modified independence;standing Pt Will Perform Lower Body Dressing: with modified independence;sit to/from stand Pt Will Transfer to Toilet: with modified independence;regular height toilet Additional ADL Goal #1: Pt will indep complete IADL medicaiton management task  OT Frequency: Min 1X/week       AM-PAC OT "6 Clicks" Daily Activity     Outcome Measure Help from another person eating meals?: Total Help from another person taking care of personal grooming?: A Little Help from another person toileting, which includes using toliet, bedpan, or urinal?: A Little Help from another person bathing (including washing, rinsing, drying)?: A Little Help from another person to put on and taking off regular upper body clothing?: A Little Help from another person to put on and taking off regular lower body clothing?: A Little 6 Click Score: 16   End of Session Equipment Utilized During Treatment: Gait belt;Rolling walker (2 wheels) Nurse Communication: Mobility status  Activity Tolerance: Patient tolerated treatment well Patient left: in bed;with bed alarm set;with call bell/phone within reach  OT Visit Diagnosis: Unsteadiness on feet (R26.81);Other abnormalities of gait and mobility (R26.89);Muscle weakness (generalized) (M62.81);Pain                Time: [id number] OT Time Calculation (min): 21 min Charges:  OT General Charges $OT Visit: 1 Visit OT Evaluation $OT Eval Moderate Complexity: 1 Mod  Derenda Mis, OTR/L Acute Rehabilitation Services Office [phone] Secure Chat Communication Preferred   Donia Pounds 05/19/2023, 8:48 AM

## 2023-05-19 NOTE — Plan of Care (Signed)
  Problem: Health Behavior/Discharge Planning: Goal: Ability to manage health-related needs will improve Outcome: Progressing   Problem: Safety: Goal: Ability to remain free from injury will improve Outcome: Progressing   

## 2023-05-19 NOTE — Progress Notes (Signed)
[facility] PROGRESS NOTE    Progress Note  ROYLE SABATH  ZOX:[id number] DOB: December 25, 1960 DOA: 05/17/2023 PCP: Pcp, No     Brief Narrative:   Jordan Calderon is an 62 y.o. male past medical history significant for HIV on [facility] therapy with a last CD4 count of 132 recent admission for syncopal episode and acute kidney injury during this admission he had a TEE that showed an EF of 60% no regional wall motion abnormality, was aggressively hydrated, CT of the head showed no acute findings.  Relates that since discharge he has been feeling weak having more headaches.  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   ENT was consulted Concern about carcinoma.  Continue meclizine and Zofran. Plan for biopsy of nurse regarding dural mass on 05/19/2023. Consult Oncology. Use morphine for pain.  HIV disease: Continue HAART therapy, continue dapsone.  Hypothyroidism: Continue Synthroid.  DVT prophylaxis: lovenox Family Communication:none Status is: Inpatient Remains inpatient appropriate because: nasopharengeal mass     Code Status:     Code Status Orders  (From admission, onward)           Start     Ordered   05/17/23 1905  Full code  Continuous       Question:  By:  Answer:  Consent: discussion documented in EHR   05/17/23 1906           Code Status History     Date Active Date Inactive Code Status Order ID Comments User Context   05/17/2023 1532 05/17/2023 1906 Full Code [id number]  Nolberto Hanlon, MD ED   05/11/2023 2024 05/12/2023 2308 Full Code [id number]  Hillary Bow, DO Inpatient   07/01/2018 1624 07/03/2018 1537 Full Code [id number]  Dorcas Carrow, MD Inpatient   02/10/2014 2054 02/12/2014 0031 Full Code [id number]  Christen Bame, MD Inpatient      Advance Directive Documentation    Flowsheet Row Most Recent Value  Type of Advance Directive Healthcare Power of Attorney, Living will  Pre-existing out of facility DNR order (yellow form or pink MOST form) --  "MOST" Form in Place? --         IV Access:   Peripheral IV   Procedures and diagnostic studies:   CT SOFT TISSUE NECK W CONTRAST  Result Date: 05/18/2023 CLINICAL DATA:  Neck mass EXAM: CT NECK WITH CONTRAST TECHNIQUE: Multidetector CT imaging of the neck was performed using the standard protocol following the bolus administration of intravenous contrast. RADIATION DOSE REDUCTION: This exam was performed according to the departmental dose-optimization program which includes automated exposure control, adjustment of the mA and/or kV according to patient size and/or use of iterative reconstruction technique. CONTRAST:  75mL OMNIPAQUE IOHEXOL 350 MG/ML SOLN COMPARISON:  CTA head/neck 05/17/23 FINDINGS: Pharynx and larynx: There is a large right nasopharyngeal mass that extends inferiorly to the level the greater horn of the hyoid bone on right. This mass involves the right-sided adenoid and palatine tonsils, the right parapharyngeal space, the right masticator space, the right parotid space. Superiorly this mass likely extends to the level of the foramen ovale bilaterally. Salivary glands: The lateral extension of the large nasopharyngeal mass abuts the right parotid gland in the superior aspect of the right submandibular gland. There is a  9 mm hypodense lesion in the left parotid gland. Thyroid: Normal. Lymph nodes: There is a 9 mm hypodense lesion in the left parotid gland (series 2, image 46). It is unclear if this represents a necrotic lymph node or an intraparotid cyst. Vascular: Negative. Limited intracranial: Negative. Visualized orbits: Negative. Mastoids and visualized paranasal sinuses: No middle ear or mastoid effusion. Paranasal sinuses are clear.  Skeleton: No acute or aggressive process. Upper chest: Negative. Other: None IMPRESSION: 1. Large right nasopharyngeal mass that extends inferiorly to the level the greater horn of the hyoid bone on the right. This mass involves the right-sided adenoid and palatine tonsils, the right parapharyngeal space, the right masticator space, the right parotid space. Superiorly this mass likely extends to the level of the foramen ovale bilaterally. 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 Recommend ENT consultation. 2. A 9 mm hypodense lesion in the left parotid gland. It is unclear if this represents a necrotic lymph node or an intraparotid cyst. Electronically Signed   By: Lorenza Cambridge M.D.   On: 05/18/2023 11:31   MR ANGIO HEAD WO CONTRAST  Result Date: 05/17/2023 CLINICAL DATA:  Initial evaluation for persistent dizziness. EXAM: MRA HEAD WITHOUT CONTRAST TECHNIQUE: Angiographic images of the Circle of Willis were acquired using MRA technique without intravenous contrast. COMPARISON:  CTA from earlier the same day. FINDINGS: Anterior circulation: Both internal carotid arteries are widely patent through the siphons without stenosis or other abnormality. Again seen is a 3 mm outpouching extending posteriorly and laterally from the left ICA terminus (series 2, image 93). Possible small vessel emanating from its apex, not entirely certain. Finding could reflect an vascular infundibulum versus aneurysm. A1 segments patent bilaterally, with the right dominant. Normal anterior communicating artery complex. Anterior cerebral arteries patent without stenosis. No M1 stenosis or occlusion. Distal MCA branches perfused and symmetric. Posterior circulation: Visualized V4 segments patent without stenosis. Right PICA patent. Left PICA origin not seen. Basilar patent without stenosis. Superior cerebellar and posterior cerebral arteries widely patent bilaterally. Anatomic variants: As above. Other: Previously  identified mass at the right skull base noted, partially visualized, better evaluated on prior CT. IMPRESSION: 1. 3 mm outpouching extending posteriorly and laterally from the left ICA terminus, potentially reflecting an vascular infundibulum versus aneurysm. Attention at follow-up recommended. 2. Otherwise normal intracranial MRA. No large vessel occlusion or other emergent finding. Electronically Signed   By: Rise Mu M.D.   On: 05/17/2023 20:58   CT ANGIO HEAD NECK W WO CM  Result Date: 05/17/2023 CLINICAL DATA:  Cerebral vasospasm suspected EXAM: CT ANGIOGRAPHY HEAD AND NECK WITH AND WITHOUT CONTRAST TECHNIQUE: Multidetector CT imaging of the head and neck was performed using the standard protocol during bolus administration of intravenous contrast. Multiplanar CT image reconstructions and MIPs were obtained to evaluate the vascular anatomy. Carotid stenosis measurements (when applicable) are obtained utilizing NASCET criteria, using the distal internal carotid diameter as the denominator. RADIATION DOSE REDUCTION: This exam was performed according to the departmental dose-optimization program which includes automated exposure control, adjustment of the mA and/or kV according to patient size and/or use of iterative reconstruction technique. CONTRAST:  75mL OMNIPAQUE IOHEXOL 350 MG/ML SOLN COMPARISON:  None Available. FINDINGS: CT HEAD FINDINGS Brain: No evidence of acute infarction, hemorrhage, hydrocephalus, extra-axial collection or intraparenchymal mass lesion/mass effect. Vascular: See below. Skull: No acute fracture. Sinuses/Orbits: Clear sinuses.  No acute orbital findings. Other: No mastoid effusions. Review of the MIP images confirms the above findings CTA NECK FINDINGS Aortic  arch: Great vessel origins are patent without significant stenosis. Right carotid system: No evidence of dissection, stenosis (50% or greater), or occlusion. Left carotid system: No evidence of dissection, stenosis  (50% or greater), or occlusion. Mild atherosclerosis at the carotid bifurcation. Vertebral arteries: Codominant. No evidence of dissection, stenosis (50% or greater), or occlusion. Skeleton: No acute abnormality on limited assessment. 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 Review of the MIP images confirms the above findings CTA HEAD FINDINGS Anterior circulation: Bilateral intracranial ICAs, MCAs, and ACAs are patent without proximal hemodynamically significant stenosis. Hypoplastic left A1 ACA, likely congenital. Approximately 2 mm posteriorly inferiorly directed outpouching arising from the left carotid terminus (for example see series 605, image 111 and series 604, image 154 Posterior circulation: Tortuous vertebral arteries. The vertebral arteries, basilar artery and bilateral posterior cerebral arteries are patent without proximal hemodynamically significant stenosis. Venous sinuses: As permitted by contrast timing, patent. Anatomic variants: Described above. Review of the MIP images confirms the above findings IMPRESSION: 1. 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 Recommend ENT consultation, correlation with direct inspection, and follow-up dedicated CT of the neck with contrast for better characterization. 2. No emergent large vessel occlusion or proximal hemodynamically significant stenosis. 3. Approximately 2 mm posteriorly inferiorly directed outpouching arising from the left carotid terminus which could represent an infundibulum with vessel not well seen versus aneurysm. 4. Aortic Atherosclerosis (ICD10-I70.0) and Emphysema (ICD10-J43.9). Electronically Signed   By: Feliberto Harts M.D.   On: 05/17/2023 14:55   DG Chest Port 1 View  Result Date: 05/17/2023 CLINICAL DATA:  Worsening cough and dry heaving  with shortness of breath EXAM: PORTABLE CHEST 1 VIEW COMPARISON:  Chest radiograph dated 05/11/2023 FINDINGS: Normal lung volumes. No focal consolidations. No pleural effusion or pneumothorax. The heart size and mediastinal contours are within normal limits. No acute osseous abnormality. IMPRESSION: No active disease. Electronically Signed   By: Agustin Cree M.D.   On: 05/17/2023 14:04     Medical Consultants:   None.   Subjective:    Ellis Parents complaining of pain of his right side of his eye.  Objective:    Vitals:   05/18/23 1533 05/18/23 2019 05/18/23 2349 05/19/23 0437  BP: (!) 144/92 (!) 140/90 127/88 (!) 129/116  Pulse: 75 81 75 76  Resp: 17 18 18  (!) 22  Temp: 97.7 F (36.5 C) 98.2 F (36.8 C) 97.8 F (36.6 C) 97.7 F (36.5 C)  TempSrc: Oral Oral Oral Oral  SpO2: 96% 94% 96% 96%  Weight:      Height:       SpO2: 96 %   Intake/Output Summary (Last 24 hours) at 05/19/2023 0804 Last data filed at 05/18/2023 2350 Gross per 24 hour  Intake --  Output 1000 ml  Net -1000 ml   Filed Weights   05/17/23 1157 05/17/23 1736  Weight: 74.6 kg 72.5 kg    Exam: General exam: In no acute distress. Respiratory system: Good air movement and clear to auscultation. Cardiovascular system: S1 & S2 heard, RRR. No JVD. Gastrointestinal system: Abdomen is nondistended, soft and nontender.  Extremities: No pedal edema. Skin: No rashes, lesions or ulcers Psychiatry: Judgement and insight appear normal. Mood & affect appropriate.  Data Reviewed:    Labs: Basic Metabolic Panel: Recent Labs  Lab 05/17/23 1232 05/17/23 1741 05/18/23 0452  NA 135  --  132*  K 4.3  --  4.1  CL 99  --  104  CO2 25  --  22  GLUCOSE 110*  --  103*  BUN 20  --  17  CREATININE 1.17 1.15 1.08  CALCIUM 9.9  --  8.4*   GFR Estimated Creatinine Clearance: 70 mL/min (by C-G formula based on SCr of 1.08 mg/dL). Liver Function Tests: Recent Labs  Lab 05/18/23 0452  AST 24  ALT 26   ALKPHOS 87  BILITOT 0.5  PROT 7.0  ALBUMIN 3.1*   No results for input(s): "LIPASE", "AMYLASE" in the last 168 hours. No results for input(s): "AMMONIA" in the last 168 hours. Coagulation profile No results for input(s): "INR", "PROTIME" in the last 168 hours. COVID-19 Labs  No results for input(s): "DDIMER", "FERRITIN", "LDH", "CRP" in the last 72 hours.  Lab Results  Component Value Date   SARSCOV2NAA NEGATIVE 05/11/2023    CBC: Recent Labs  Lab 05/17/23 1232 05/17/23 1741 05/18/23 0452  WBC 8.7 9.6 6.9  HGB 10.2* 9.8* 9.1*  HCT 30.3* 28.9* 27.2*  MCV 96.2 95.1 96.5  PLT 261 250 215   Cardiac Enzymes: No results for input(s): "CKTOTAL", "CKMB", "CKMBINDEX", "TROPONINI" in the last 168 hours. BNP (last 3 results) No results for input(s): "PROBNP" in the last 8760 hours. CBG: No results for input(s): "GLUCAP" in the last 168 hours.  D-Dimer: No results for input(s): "DDIMER" in the last 72 hours. Hgb A1c: Recent Labs    05/17/23 1741  HGBA1C 4.2*   Lipid Profile: Recent Labs    05/18/23 0452  CHOL 117  HDL 20*  LDLCALC 74  TRIG 782  CHOLHDL 5.9   Thyroid function studies: No results for input(s): "TSH", "T4TOTAL", "T3FREE", "THYROIDAB" in the last 72 hours.  Invalid input(s): "FREET3" Anemia work up: No results for input(s): "VITAMINB12", "FOLATE", "FERRITIN", "TIBC", "IRON", "RETICCTPCT" in the last 72 hours. Sepsis Labs: Recent Labs  Lab 05/17/23 1232 05/17/23 1741 05/18/23 0452  WBC 8.7 9.6 6.9   Microbiology Recent Results (from the past 240 hour(s))  Group A Strep by PCR     Status: None   Collection Time: 05/11/23 10:35 AM   Specimen: Throat; Sterile Swab  Result Value Ref Range Status   Group A Strep by PCR NOT DETECTED NOT DETECTED Final    Comment: Performed at [facility], [address] [place], [place] [address]  Resp panel by RT-PCR (RSV, Flu A&B, Covid) Throat     Status: None   Collection Time: 05/11/23 10:36  AM   Specimen: Throat; Nasal Swab  Result Value Ref Range Status   SARS Coronavirus 2 by RT PCR NEGATIVE NEGATIVE Final    Comment: (NOTE) SARS-CoV-2 target nucleic acids are NOT DETECTED.  The SARS-CoV-2 RNA is generally detectable in upper respiratory specimens during the acute phase of infection. The lowest concentration of SARS-CoV-2 viral copies this assay can detect is 138 copies/mL. A negative result does not preclude SARS-Cov-2 infection and should not be used as the sole basis for treatment or other patient management decisions. A negative result may occur with  improper specimen collection/handling, submission of specimen other than nasopharyngeal swab, presence of viral mutation(s) within the areas targeted by this assay, and inadequate number of viral copies(<138 copies/mL). A negative result must be combined with clinical observations, patient history, and epidemiological information. The expected result is Negative.  Fact Sheet for Patients:  BloggerCourse.com  Fact Sheet for Healthcare Providers:  SeriousBroker.it  This test  is no t yet approved or cleared by the [place] and  has been authorized for detection and/or diagnosis of SARS-CoV-2 by FDA under an Emergency Use Authorization (EUA). This EUA will remain  in effect (meaning this test can be used) for the duration of the COVID-19 declaration under Section 564(b)(1) of the Act, 21 U.S.C.section 360bbb-3(b)(1), unless the authorization is terminated  or revoked sooner.       Influenza A by PCR NEGATIVE NEGATIVE Final   Influenza B by PCR NEGATIVE NEGATIVE Final    Comment: (NOTE) The Xpert Xpress SARS-CoV-2/FLU/RSV plus assay is intended as an aid in the diagnosis of influenza from Nasopharyngeal swab specimens and should not be used as a sole basis for treatment. Nasal washings and aspirates are unacceptable for Xpert Xpress  SARS-CoV-2/FLU/RSV testing.  Fact Sheet for Patients: BloggerCourse.com  Fact Sheet for Healthcare Providers: SeriousBroker.it  This test is not yet approved or cleared by the [place] FDA and has been authorized for detection and/or diagnosis of SARS-CoV-2 by FDA under an Emergency Use Authorization (EUA). This EUA will remain in effect (meaning this test can be used) for the duration of the COVID-19 declaration under Section 564(b)(1) of the Act, 21 U.S.C. section 360bbb-3(b)(1), unless the authorization is terminated or revoked.     Resp Syncytial Virus by PCR NEGATIVE NEGATIVE Final    Comment: (NOTE) Fact Sheet for Patients: BloggerCourse.com  Fact Sheet for Healthcare Providers: SeriousBroker.it  This test is not yet approved or cleared by the [place] FDA and has been authorized for detection and/or diagnosis of SARS-CoV-2 by FDA under an Emergency Use Authorization (EUA). This EUA will remain in effect (meaning this test can be used) for the duration of the COVID-19 declaration under Section 564(b)(1) of the Act, 21 U.S.C. section 360bbb-3(b)(1), unless the authorization is terminated or revoked.  Performed at [facility] [address] [place], [place] [address]   Respiratory (~20 pathogens) panel by PCR     Status: None   Collection Time: 05/11/23 10:48 PM   Specimen: Nasopharyngeal Swab; Respiratory  Result Value Ref Range Status   Adenovirus NOT DETECTED NOT DETECTED Final   Coronavirus 229E NOT DETECTED NOT DETECTED Final    Comment: (NOTE) The Coronavirus on the Respiratory Panel, DOES NOT test for the novel  Coronavirus (2019 nCoV)    Coronavirus HKU1 NOT DETECTED NOT DETECTED Final   Coronavirus NL63 NOT DETECTED NOT DETECTED Final   Coronavirus OC43 NOT DETECTED NOT DETECTED Final   Metapneumovirus NOT DETECTED NOT DETECTED Final    Rhinovirus / Enterovirus NOT DETECTED NOT DETECTED Final   Influenza A NOT DETECTED NOT DETECTED Final   Influenza B NOT DETECTED NOT DETECTED Final   Parainfluenza Virus 1 NOT DETECTED NOT DETECTED Final   Parainfluenza Virus 2 NOT DETECTED NOT DETECTED Final   Parainfluenza Virus 3 NOT DETECTED NOT DETECTED Final   Parainfluenza Virus 4 NOT DETECTED NOT DETECTED Final   Respiratory Syncytial Virus NOT DETECTED NOT DETECTED Final   Bordetella pertussis NOT DETECTED NOT DETECTED Final   Bordetella Parapertussis NOT DETECTED NOT DETECTED Final   Chlamydophila pneumoniae NOT DETECTED NOT DETECTED Final   Mycoplasma pneumoniae NOT DETECTED NOT DETECTED Final    Comment: Performed at [facility], [address] [place], [place] [address]  Blood culture (routine x 2)     Status: None (Preliminary result)   Collection Time: 05/17/23 12:13 PM   Specimen: BLOOD  Result Value Ref  Range Status   Specimen Description   Final    BLOOD LEFT ANTECUBITAL Performed at [facility], [address] [place], [place] [address]    Special Requests   Final    BOTTLES DRAWN AEROBIC AND ANAEROBIC Blood Culture adequate volume Performed at [facility], [address] [place], [place] [address]    Culture   Final    NO GROWTH < 24 HOURS Performed at [facility], [address] [place], [place] [address]    Report Status PENDING  Incomplete  Blood culture (routine x 2)     Status: None (Preliminary result)   Collection Time: 05/17/23 12:18 PM   Specimen: BLOOD RIGHT HAND  Result Value Ref Range Status   Specimen Description   Final    BLOOD RIGHT HAND Performed at [facility], [address] [place], [place] [address]    Special Requests   Final    BOTTLES DRAWN AEROBIC AND ANAEROBIC Blood Culture adequate volume Performed at [facility], [address] [place], [place] [address]    Culture   Final    NO GROWTH < 24 HOURS Performed at  [facility], [address], [place], [place] [address]    Report Status PENDING  Incomplete  Surgical pcr screen     Status: None   Collection Time: 05/18/23  8:38 PM   Specimen: Nasal Mucosa; Nasal Swab  Result Value Ref Range Status   MRSA, PCR NEGATIVE NEGATIVE Final   Staphylococcus aureus NEGATIVE NEGATIVE Final    Comment: (NOTE) The Xpert SA Assay (FDA approved for NASAL specimens in patients 36 years of age and older), is one component of a comprehensive surveillance program. It is not intended to diagnose infection nor to guide or monitor treatment. Performed at [facility], [address], [place], [place] [address]      Medications:    calcium carbonate  1 tablet Oral TID WC   dapsone  100 mg Oral Q breakfast   Darunavir-Cobicistat-Emtricitabine-Tenofovir Alafenamide  1 tablet Oral Q breakfast   enoxaparin (LOVENOX) injection  40 mg Subcutaneous Q24H   Continuous Infusions:      LOS: 2 days   Marinda Elk  [facility]  05/19/2023, 8:04 AM

## 2023-05-19 NOTE — Plan of Care (Deleted)
  Problem: Education: Goal: Knowledge of General Education information will improve Description: Including pain rating scale, medication(s)/side effects and non-pharmacologic comfort measures Outcome: Not Progressing   Problem: Health Behavior/Discharge Planning: Goal: Ability to manage health-related needs will improve Outcome: Not Progressing   Problem: Clinical Measurements: Goal: Will remain free from infection Outcome: Progressing Goal: Respiratory complications will improve Outcome: Progressing Goal: Cardiovascular complication will be avoided Outcome: Progressing   Problem: Nutrition: Goal: Adequate nutrition will be maintained Outcome: Progressing   Problem: Coping: Goal: Level of anxiety will decrease Outcome: Not Progressing   Problem: Elimination: Goal: Will not experience complications related to bowel motility Outcome: Progressing Goal: Will not experience complications related to urinary retention Outcome: Progressing   Problem: Pain Management: Goal: General experience of comfort will improve Outcome: Not Progressing   Problem: Safety: Goal: Ability to remain free from injury will improve Outcome: Progressing

## 2023-05-19 NOTE — Progress Notes (Signed)
The patient is evaluated in the preop area.  No change in his health status.  His history and physical exam findings were documented in his H&P yesterday.  Proceed with nasopharyngeal biopsy.

## 2023-05-19 NOTE — TOC Initial Note (Addendum)
Transition of Care [facility]) - Initial/Assessment Note    Patient Details  Name: Jordan Calderon MRN: [id number] Date of Birth: 01/02/1961  Transition of Care [facility]) CM/SW Contact:    Kermit Balo, RN Phone Number: 05/19/2023, 2:32 PM  Clinical Narrative:                  Pt is from home with alone. He states his neighbor, Archie Patten checks on him. Archie Patten or [employer] provides his transportation.  Pt denies issues with home medications. He doesn't have a PCP. CM was able to get him set up with [facility]. Information on the AVS. [address] will transport him for his appointments.  Home health arranged with [facility] after providing choice with [email or web address]. information on the AVS. [facility] will contact him for the first home visit.  Walker ordered through [facility] and will be delivered to the room. Archie Patten will transport home at d/c.   Expected Discharge Plan: Home w Home Health Services Barriers to Discharge: Continued Medical Work up   Patient Goals and CMS Choice   CMS Medicare.gov Compare Post Acute Care list provided to:: Patient Choice offered to / list presented to : Patient      Expected Discharge Plan and Services   Discharge Planning Services: CM Consult Post Acute Care Choice: Home Health, Durable Medical Equipment Living arrangements for the past 2 months: Single Family Home                 DME Arranged: Walker rolling DME Agency: [facility] Date DME Agency Contacted: 05/19/23   Representative spoke with at DME Agency: Zack HH Arranged: PT, OT HH Agency: [facility] Date HH Agency Contacted: 05/19/23   Representative spoke with at [facility] Agency: Adele Dan  Prior Living Arrangements/Services Living arrangements for the past 2 months: Single Family Home Lives with:: Self Patient language and need for interpreter reviewed:: Yes Do you feel safe going back to the place where you live?: Yes        Care giver support system in place?: No  (comment)   Criminal Activity/Legal Involvement Pertinent to Current Situation/Hospitalization: No - Comment as needed  Activities of Daily Living   ADL Screening (condition at time of admission) Independently performs ADLs?: Yes (appropriate for developmental age) Is the patient deaf or have difficulty hearing?: No Does the patient have difficulty seeing, even when wearing glasses/contacts?: No Does the patient have difficulty concentrating, remembering, or making decisions?: No  Permission Sought/Granted                  Emotional Assessment Appearance:: Appears stated age Attitude/Demeanor/Rapport: Engaged Affect (typically observed): Accepting Orientation: : Oriented to Self, Oriented to Place, Oriented to  Time, Oriented to Situation   Psych Involvement: No (comment)  Admission diagnosis:  Dizziness [R42] Vertigo [R42] 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] Patient Active Problem List   Diagnosis Date Noted   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   Deviated nasal septum 05/18/2023   Hypertrophy of nasal turbinates 05/18/2023   Chronic rhinitis 05/18/2023   Dizziness 05/18/2023   Vertigo 05/17/2023   Syncope 05/11/2023   Hypothyroidism 05/11/2023   AKI (acute kidney injury) (HCC) 05/11/2023   Pap smear of anus with ASCUS 04/26/2023   Routine screening for STI (sexually transmitted infection) 04/19/2023   Encounter for long-term (current) use of high-risk medication 01/17/2023   Soft tissue infection 04/28/2022   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   Normocytic anemia 07/01/2018   URI (upper respiratory infection) 07/01/2018   Former  cigarette smoker 07/29/2008   Human immunodeficiency virus (HIV) disease (HCC) 04/06/2006   Hepatitis C 04/06/2006   Anxiety state 04/06/2006   Polysubstance abuse (HCC) 04/06/2006   Depression 04/06/2006   Dental caries 04/06/2006   ANAL FISSURE 04/06/2006   PCP:  Pcp, No Pharmacy:   [id number] - [place], [place] - [address] [place]  [address] Phone: [phone] Fax: [phone]     Social Determinants of Health (SDOH) Social History: SDOH Screenings   Food Insecurity: No Food Insecurity (05/17/2023)  Housing: Low Risk  (05/17/2023)  Recent Concern: Housing - High Risk (05/12/2023)  Transportation Needs: No Transportation Needs (05/17/2023)  Utilities: Not At Risk (05/17/2023)  Depression (PHQ2-9): Low Risk  (04/19/2023)  Tobacco Use: Medium Risk (05/19/2023)   SDOH Interventions:     Readmission Risk Interventions     No data to display

## 2023-05-19 NOTE — Anesthesia Preprocedure Evaluation (Addendum)
Anesthesia Evaluation  Patient identified by MRN, date of birth, ID band Patient awake    Reviewed: Allergy & Precautions, NPO status , Patient's Chart, lab work & pertinent test results  Airway Mallampati: IV     Mouth opening: Limited Mouth Opening  Dental  (+) Poor Dentition, Dental Advisory Given, Loose,    Pulmonary former smoker Nasopharyngeal mass   Pulmonary exam normal breath sounds clear to auscultation       Cardiovascular negative cardio ROS Normal cardiovascular exam Rhythm:Regular Rate:Normal     Neuro/Psych  Headaches PSYCHIATRIC DISORDERS Anxiety Depression    Secondary syphilis    GI/Hepatic negative GI ROS,,,(+) Hepatitis -, C  Endo/Other  Hypothyroidism  Hyperlipidemia  Renal/GU Renal disease  negative genitourinary   Musculoskeletal   Abdominal   Peds  Hematology  (+) Blood dyscrasia, anemia , HIV  Anesthesia Other Findings   Reproductive/Obstetrics                             Anesthesia Physical Anesthesia Plan  ASA: 3  Anesthesia Plan: General   Post-op Pain Management: Minimal or no pain anticipated   Induction:   PONV Risk Score and Plan: 3 and Treatment may vary due to age or medical condition  Airway Management Planned: Oral ETT  Additional Equipment: None  Intra-op Plan:   Post-operative Plan: Extubation in OR  Informed Consent: I have reviewed the patients History and Physical, chart, labs and discussed the procedure including the risks, benefits and alternatives for the proposed anesthesia with the patient or authorized representative who has indicated his/her understanding and acceptance.     Dental advisory given  Plan Discussed with: CRNA and Anesthesiologist  Anesthesia Plan Comments:         Anesthesia Quick Evaluation

## 2023-05-19 NOTE — Plan of Care (Signed)
  Problem: Education: Goal: Knowledge of General Education information will improve Description: Including pain rating scale, medication(s)/side effects and non-pharmacologic comfort measures 05/19/2023 1657 by Juluis Mire, RN Outcome: Progressing 05/19/2023 1651 by Juluis Mire, RN Outcome: Not Progressing   Problem: Health Behavior/Discharge Planning: Goal: Ability to manage health-related needs will improve 05/19/2023 1657 by Juluis Mire, RN Outcome: Progressing 05/19/2023 1651 by Juluis Mire, RN Outcome: Not Progressing   Problem: Clinical Measurements: Goal: Ability to maintain clinical measurements within normal limits will improve Outcome: Progressing Goal: Will remain free from infection 05/19/2023 1657 by Juluis Mire, RN Outcome: Progressing 05/19/2023 1651 by Juluis Mire, RN Outcome: Progressing Goal: Diagnostic test results will improve Outcome: Progressing Goal: Respiratory complications will improve 05/19/2023 1657 by Juluis Mire, RN Outcome: Progressing 05/19/2023 1651 by Juluis Mire, RN Outcome: Progressing Goal: Cardiovascular complication will be avoided 05/19/2023 1657 by Juluis Mire, RN Outcome: Progressing 05/19/2023 1651 by Juluis Mire, RN Outcome: Progressing   Problem: Activity: Goal: Risk for activity intolerance will decrease Outcome: Progressing   Problem: Nutrition: Goal: Adequate nutrition will be maintained 05/19/2023 1657 by Juluis Mire, RN Outcome: Progressing 05/19/2023 1651 by Juluis Mire, RN Outcome: Progressing   Problem: Coping: Goal: Level of anxiety will decrease 05/19/2023 1657 by Juluis Mire, RN Outcome: Progressing 05/19/2023 1651 by Juluis Mire, RN Outcome: Not Progressing   Problem: Elimination: Goal: Will not experience complications related to bowel motility 05/19/2023 1657 by Juluis Mire,  RN Outcome: Progressing 05/19/2023 1651 by Juluis Mire, RN Outcome: Progressing Goal: Will not experience complications related to urinary retention 05/19/2023 1657 by Juluis Mire, RN Outcome: Progressing 05/19/2023 1651 by Juluis Mire, RN Outcome: Progressing   Problem: Pain Management: Goal: General experience of comfort will improve 05/19/2023 1657 by Juluis Mire, RN Outcome: Progressing 05/19/2023 1651 by Juluis Mire, RN Outcome: Not Progressing   Problem: Safety: Goal: Ability to remain free from injury will improve 05/19/2023 1657 by Juluis Mire, RN Outcome: Progressing 05/19/2023 1651 by Juluis Mire, RN Outcome: Progressing   Problem: Skin Integrity: Goal: Risk for impaired skin integrity will decrease Outcome: Progressing

## 2023-05-20 ENCOUNTER — Inpatient Hospital Stay (HOSPITAL_COMMUNITY): Payer: Medicare Other

## 2023-05-20 ENCOUNTER — Encounter (HOSPITAL_COMMUNITY): Payer: Self-pay | Admitting: Otolaryngology

## 2023-05-20 DIAGNOSIS — R42 Dizziness and giddiness: Secondary | ICD-10-CM | POA: Diagnosis not present

## 2023-05-20 DIAGNOSIS — J392 Other diseases of pharynx: Secondary | ICD-10-CM | POA: Diagnosis not present

## 2023-05-20 MED ORDER — IOHEXOL 350 MG/ML SOLN
50.0000 mL | Freq: Once | INTRAVENOUS | Status: AC | PRN
  Administered 2023-05-20: 50 mL via INTRAVENOUS

## 2023-05-20 MED ORDER — SODIUM CHLORIDE 0.9 % IV SOLN: INTRAVENOUS | Status: AC

## 2023-05-20 NOTE — Progress Notes (Signed)
[facility] PROGRESS NOTE    Progress Note  Jordan Calderon  ZOX:[id number] DOB: 1961/05/31 DOA: 05/17/2023 PCP: Pcp, No     Brief Narrative:   Jordan Calderon is an 62 y.o. male past medical history significant for HIV on [facility] therapy with a last CD4 count of 132 recent admission for syncopal episode and acute kidney injury during this admission he had a TEE that showed an EF of 60% no regional wall motion abnormality, was aggressively hydrated, CT of the head showed no acute findings.  Relates that since discharge he has been feeling weak having more headaches.  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   ENT was consulted Concern about carcinoma.  Status post biopsy on 05/19/2023. Oncology consulted recommended CT of the chest with contrast, EBV DNA. PET scan and radiation oncology consult as an outpatient. Use morphine for pain.  HIV disease: Continue HAART therapy, continue dapsone.  Hypothyroidism: Continue Synthroid.  DVT prophylaxis: lovenox Family Communication:none Status is: Inpatient Remains inpatient appropriate because: nasopharengeal mass     Code Status:     Code Status Orders  (From admission, onward)           Start     Ordered   05/17/23 1905  Full code  Continuous       Question:  By:  Answer:  Consent: discussion documented in EHR   05/17/23 1906           Code Status History     Date Active Date Inactive Code Status Order ID Comments User Context   05/17/2023 1532 05/17/2023 1906 Full Code [id number]  Nolberto Hanlon, MD ED   05/11/2023 2024 05/12/2023 2308 Full Code [id number]  Hillary Bow, DO Inpatient   07/01/2018 1624 07/03/2018 1537 Full Code [id number]  Dorcas Carrow, MD Inpatient    02/10/2014 2054 02/12/2014 0031 Full Code [id number]  Christen Bame, MD Inpatient      Advance Directive Documentation    Flowsheet Row Most Recent Value  Type of Advance Directive Healthcare Power of Attorney, Living will  Pre-existing out of facility DNR order (yellow form or pink MOST form) --  "MOST" Form in Place? --         IV Access:   Peripheral IV   Procedures and diagnostic studies:   CT SOFT TISSUE NECK W CONTRAST  Result Date: 05/18/2023 CLINICAL DATA:  Neck mass EXAM: CT NECK WITH CONTRAST TECHNIQUE: Multidetector CT imaging of the neck was performed using the standard protocol following the bolus administration of intravenous contrast. RADIATION DOSE REDUCTION: This exam was performed according to the departmental dose-optimization program which includes automated exposure control, adjustment of the mA and/or kV according to patient size and/or use of iterative reconstruction technique. CONTRAST:  75mL OMNIPAQUE IOHEXOL 350 MG/ML SOLN COMPARISON:  CTA head/neck 05/17/23 FINDINGS: Pharynx and larynx: There is a large right nasopharyngeal mass that extends inferiorly to the level the greater horn of the hyoid bone on right. This mass involves the right-sided adenoid and palatine tonsils, the right parapharyngeal space, the right masticator space, the right parotid space. Superiorly this mass likely extends to the level of the foramen ovale bilaterally. Salivary glands: The lateral extension of the large nasopharyngeal mass abuts the right parotid gland in the superior  aspect of the right submandibular gland. There is a 9 mm hypodense lesion in the left parotid gland. Thyroid: Normal. Lymph nodes: There is a 9 mm hypodense lesion in the left parotid gland (series 2, image 46). It is unclear if this represents a necrotic lymph node or an intraparotid cyst. Vascular: Negative. Limited intracranial: Negative. Visualized orbits: Negative. Mastoids and visualized paranasal sinuses: No  middle ear or mastoid effusion. Paranasal sinuses are clear. Skeleton: No acute or aggressive process. Upper chest: Negative. Other: None IMPRESSION: 1. Large right nasopharyngeal mass that extends inferiorly to the level the greater horn of the hyoid bone on the right. This mass involves the right-sided adenoid and palatine tonsils, the right parapharyngeal space, the right masticator space, the right parotid space. Superiorly this mass likely extends to the level of the foramen ovale bilaterally. 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 Recommend ENT consultation. 2. A 9 mm hypodense lesion in the left parotid gland. It is unclear if this represents a necrotic lymph node or an intraparotid cyst. Electronically Signed   By: Lorenza Cambridge M.D.   On: 05/18/2023 11:31     Medical Consultants:   None.   Subjective:    Jordan Calderon relates his pain is controlled dizziness and nausea too.  Objective:    Vitals:   05/19/23 2323 05/20/23 0310 05/20/23 0322 05/20/23 0804  BP: 116/80 133/89 134/81 126/80  Pulse: 94 78  76  Resp: 18 18  18   Temp: 98.4 F (36.9 C) 98.2 F (36.8 C) 98.8 F (37.1 C) 97.8 F (36.6 C)  TempSrc: Oral Oral Oral Oral  SpO2: 97% 98% 95% 95%  Weight:      Height:       SpO2: 95 % O2 Flow Rate (L/min): 5 L/min   Intake/Output Summary (Last 24 hours) at 05/20/2023 0815 Last data filed at 05/20/2023 0806 Gross per 24 hour  Intake 1152 ml  Output 2400 ml  Net -1248 ml   Filed Weights   05/17/23 1157 05/17/23 1736 05/19/23 1031  Weight: 74.6 kg 72.5 kg 72.6 kg    Exam: General exam: In no acute distress. Respiratory system: Good air movement and clear to auscultation. Cardiovascular system: S1 & S2 heard, RRR. No JVD. Gastrointestinal system: Abdomen is nondistended, soft and nontender.  Extremities: No pedal edema. Skin: No rashes, lesions or ulcers Psychiatry: Judgement and insight appear normal. Mood & affect  appropriate.  Data Reviewed:    Labs: Basic Metabolic Panel: Recent Labs  Lab 05/17/23 1232 05/17/23 1741 05/18/23 0452  NA 135  --  132*  K 4.3  --  4.1  CL 99  --  104  CO2 25  --  22  GLUCOSE 110*  --  103*  BUN 20  --  17  CREATININE 1.17 1.15 1.08  CALCIUM 9.9  --  8.4*   GFR Estimated Creatinine Clearance: 70 mL/min (by C-G formula based on SCr of 1.08 mg/dL). Liver Function Tests: Recent Labs  Lab 05/18/23 0452  AST 24  ALT 26  ALKPHOS 87  BILITOT 0.5  PROT 7.0  ALBUMIN 3.1*   No results for input(s): "LIPASE", "AMYLASE" in the last 168 hours. No results for input(s): "AMMONIA" in the last 168 hours. Coagulation profile No results for input(s): "INR", "PROTIME" in the last 168 hours. COVID-19 Labs  No results for input(s): "DDIMER", "FERRITIN", "LDH", "CRP" in the last 72 hours.  Lab Results  Component Value Date   SARSCOV2NAA NEGATIVE 05/11/2023  CBC: Recent Labs  Lab 05/17/23 1232 05/17/23 1741 05/18/23 0452  WBC 8.7 9.6 6.9  HGB 10.2* 9.8* 9.1*  HCT 30.3* 28.9* 27.2*  MCV 96.2 95.1 96.5  PLT 261 250 215   Cardiac Enzymes: No results for input(s): "CKTOTAL", "CKMB", "CKMBINDEX", "TROPONINI" in the last 168 hours. BNP (last 3 results) No results for input(s): "PROBNP" in the last 8760 hours. CBG: No results for input(s): "GLUCAP" in the last 168 hours.  D-Dimer: No results for input(s): "DDIMER" in the last 72 hours. Hgb A1c: Recent Labs    05/17/23 1741  HGBA1C 4.2*   Lipid Profile: Recent Labs    05/18/23 0452  CHOL 117  HDL 20*  LDLCALC 74  TRIG 604  CHOLHDL 5.9   Thyroid function studies: No results for input(s): "TSH", "T4TOTAL", "T3FREE", "THYROIDAB" in the last 72 hours.  Invalid input(s): "FREET3" Anemia work up: No results for input(s): "VITAMINB12", "FOLATE", "FERRITIN", "TIBC", "IRON", "RETICCTPCT" in the last 72 hours. Sepsis Labs: Recent Labs  Lab 05/17/23 1232 05/17/23 1741 05/18/23 0452  WBC 8.7  9.6 6.9   Microbiology Recent Results (from the past 240 hour(s))  Group A Strep by PCR     Status: None   Collection Time: 05/11/23 10:35 AM   Specimen: Throat; Sterile Swab  Result Value Ref Range Status   Group A Strep by PCR NOT DETECTED NOT DETECTED Final    Comment: Performed at [facility], [address] [place], [place] [address]  Resp panel by RT-PCR (RSV, Flu A&B, Covid) Throat     Status: None   Collection Time: 05/11/23 10:36 AM   Specimen: Throat; Nasal Swab  Result Value Ref Range Status   SARS Coronavirus 2 by RT PCR NEGATIVE NEGATIVE Final    Comment: (NOTE) SARS-CoV-2 target nucleic acids are NOT DETECTED.  The SARS-CoV-2 RNA is generally detectable in upper respiratory specimens during the acute phase of infection. The lowest concentration of SARS-CoV-2 viral copies this assay can detect is 138 copies/mL. A negative result does not preclude SARS-Cov-2 infection and should not be used as the sole basis for treatment or other patient management decisions. A negative result may occur with  improper specimen collection/handling, submission of specimen other than nasopharyngeal swab, presence of viral mutation(s) within the areas targeted by this assay, and inadequate number of viral copies(<138 copies/mL). A negative result must be combined with clinical observations, patient history, and epidemiological information. The expected result is Negative.  Fact Sheet for Patients:  BloggerCourse.com  Fact Sheet for Healthcare Providers:  SeriousBroker.it  This test is no t yet approved or cleared by the [place] FDA and  has been authorized for detection and/or diagnosis of SARS-CoV-2 by FDA under an Emergency Use Authorization (EUA). This EUA will remain  in effect (meaning this test can be used) for the duration of the COVID-19 declaration under Section 564(b)(1) of the Act, 21 U.S.C.section  360bbb-3(b)(1), unless the authorization is terminated  or revoked sooner.       Influenza A by PCR NEGATIVE NEGATIVE Final   Influenza B by PCR NEGATIVE NEGATIVE Final    Comment: (NOTE) The Xpert Xpress SARS-CoV-2/FLU/RSV plus assay is intended as an aid in the diagnosis of influenza from Nasopharyngeal swab specimens and should not be used as a sole basis for treatment. Nasal washings and aspirates are unacceptable for Xpert Xpress SARS-CoV-2/FLU/RSV testing.  Fact Sheet for Patients: BloggerCourse.com  Fact Sheet for Healthcare Providers: SeriousBroker.it  This test is not yet  approved or cleared by the [place] and has been authorized for detection and/or diagnosis of SARS-CoV-2 by FDA under an Emergency Use Authorization (EUA). This EUA will remain in effect (meaning this test can be used) for the duration of the COVID-19 declaration under Section 564(b)(1) of the Act, 21 U.S.C. section 360bbb-3(b)(1), unless the authorization is terminated or revoked.     Resp Syncytial Virus by PCR NEGATIVE NEGATIVE Final    Comment: (NOTE) Fact Sheet for Patients: BloggerCourse.com  Fact Sheet for Healthcare Providers: SeriousBroker.it  This test is not yet approved or cleared by the [place] FDA and has been authorized for detection and/or diagnosis of SARS-CoV-2 by FDA under an Emergency Use Authorization (EUA). This EUA will remain in effect (meaning this test can be used) for the duration of the COVID-19 declaration under Section 564(b)(1) of the Act, 21 U.S.C. section 360bbb-3(b)(1), unless the authorization is terminated or revoked.  Performed at [facility], [address] [place], [place] [address]   Respiratory (~20 pathogens) panel by PCR     Status: None   Collection Time: 05/11/23 10:48 PM   Specimen: Nasopharyngeal Swab; Respiratory   Result Value Ref Range Status   Adenovirus NOT DETECTED NOT DETECTED Final   Coronavirus 229E NOT DETECTED NOT DETECTED Final    Comment: (NOTE) The Coronavirus on the Respiratory Panel, DOES NOT test for the novel  Coronavirus (2019 nCoV)    Coronavirus HKU1 NOT DETECTED NOT DETECTED Final   Coronavirus NL63 NOT DETECTED NOT DETECTED Final   Coronavirus OC43 NOT DETECTED NOT DETECTED Final   Metapneumovirus NOT DETECTED NOT DETECTED Final   Rhinovirus / Enterovirus NOT DETECTED NOT DETECTED Final   Influenza A NOT DETECTED NOT DETECTED Final   Influenza B NOT DETECTED NOT DETECTED Final   Parainfluenza Virus 1 NOT DETECTED NOT DETECTED Final   Parainfluenza Virus 2 NOT DETECTED NOT DETECTED Final   Parainfluenza Virus 3 NOT DETECTED NOT DETECTED Final   Parainfluenza Virus 4 NOT DETECTED NOT DETECTED Final   Respiratory Syncytial Virus NOT DETECTED NOT DETECTED Final   Bordetella pertussis NOT DETECTED NOT DETECTED Final   Bordetella Parapertussis NOT DETECTED NOT DETECTED Final   Chlamydophila pneumoniae NOT DETECTED NOT DETECTED Final   Mycoplasma pneumoniae NOT DETECTED NOT DETECTED Final    Comment: Performed at [facility], [address], [place], [place] [address]  Blood culture (routine x 2)     Status: None (Preliminary result)   Collection Time: 05/17/23 12:13 PM   Specimen: BLOOD  Result Value Ref Range Status   Specimen Description   Final    BLOOD LEFT ANTECUBITAL Performed at [facility], [address] [place], [place] [address]    Special Requests   Final    BOTTLES DRAWN AEROBIC AND ANAEROBIC Blood Culture adequate volume Performed at [facility], [address] [place], [place] [address]    Culture   Final    NO GROWTH 3 DAYS Performed at [facility], [address], [place], [place] [address]    Report Status PENDING  Incomplete  Blood culture (routine x 2)     Status: None (Preliminary result)   Collection Time:  05/17/23 12:18 PM   Specimen: BLOOD RIGHT HAND  Result Value Ref Range Status   Specimen Description   Final    BLOOD RIGHT HAND Performed at [facility], [address], [place], [place] [address]  Special Requests   Final    BOTTLES DRAWN AEROBIC AND ANAEROBIC Blood Culture adequate volume Performed at [facility], [address] [place], [place] [address]    Culture   Final    NO GROWTH 3 DAYS Performed at [facility], [address] [place], [place] [address]    Report Status PENDING  Incomplete  Surgical pcr screen     Status: None   Collection Time: 05/18/23  8:38 PM   Specimen: Nasal Mucosa; Nasal Swab  Result Value Ref Range Status   MRSA, PCR NEGATIVE NEGATIVE Final   Staphylococcus aureus NEGATIVE NEGATIVE Final    Comment: (NOTE) The Xpert SA Assay (FDA approved for NASAL specimens in patients 24 years of age and older), is one component of a comprehensive surveillance program. It is not intended to diagnose infection nor to guide or monitor treatment. Performed at [facility], [address] [place], [place] [address]      Medications:    calcium carbonate  1 tablet Oral TID WC   dapsone  100 mg Oral Q breakfast   Darunavir-Cobicistat-Emtricitabine-Tenofovir Alafenamide  1 tablet Oral Q breakfast   enoxaparin (LOVENOX) injection  40 mg Subcutaneous Q24H   Continuous Infusions:      LOS: 3 days   Marinda Elk  [facility]  05/20/2023, 8:15 AM

## 2023-05-20 NOTE — Plan of Care (Signed)
  Problem: Education: Goal: Knowledge of General Education information will improve Description: Including pain rating scale, medication(s)/side effects and non-pharmacologic comfort measures Outcome: Progressing   Problem: Clinical Measurements: Goal: Ability to maintain clinical measurements within normal limits will improve Outcome: Progressing Goal: Will remain free from infection Outcome: Progressing Goal: Respiratory complications will improve Outcome: Progressing Goal: Cardiovascular complication will be avoided Outcome: Progressing   Problem: Activity: Goal: Risk for activity intolerance will decrease Outcome: Progressing   Problem: Nutrition: Goal: Adequate nutrition will be maintained Outcome: Progressing   Problem: Elimination: Goal: Will not experience complications related to urinary retention Outcome: Progressing   Problem: Pain Management: Goal: General experience of comfort will improve Outcome: Progressing   Problem: Clinical Measurements: Goal: Diagnostic test results will improve Outcome: Not Progressing

## 2023-05-20 NOTE — Consult Note (Signed)
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 about the content, text or information contained within the body of this dictation should be directly addressed to the provider for clarification.

## 2023-05-20 NOTE — Plan of Care (Signed)

## 2023-05-21 DIAGNOSIS — B20 Human immunodeficiency virus [HIV] disease: Secondary | ICD-10-CM

## 2023-05-21 DIAGNOSIS — D649 Anemia, unspecified: Secondary | ICD-10-CM | POA: Diagnosis not present

## 2023-05-21 DIAGNOSIS — R42 Dizziness and giddiness: Secondary | ICD-10-CM | POA: Diagnosis not present

## 2023-05-21 DIAGNOSIS — J392 Other diseases of pharynx: Secondary | ICD-10-CM | POA: Diagnosis not present

## 2023-05-21 DIAGNOSIS — K921 Melena: Secondary | ICD-10-CM

## 2023-05-21 MED ORDER — PANTOPRAZOLE SODIUM 40 MG IV SOLR
40.0000 mg | Freq: Two times a day (BID) | INTRAVENOUS | Status: DC
  Administered 2023-05-21 – 2023-05-22 (×3): 40 mg via INTRAVENOUS
  Filled 2023-05-21 (×3): qty 10
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
## 2023-05-21 NOTE — Progress Notes (Signed)
[facility] PROGRESS NOTE    Progress Note  Jordan Calderon  UJW:[id number] DOB: [birth year] DOA: 05/17/2023 PCP: Pcp, No     Brief Narrative:   Jordan Calderon is an 62 y.o. male past medical history significant for HIV on [facility] therapy with a last CD4 count of 132 recent admission for syncopal episode and acute kidney injury during this admission he had a TEE that showed an EF of 60% no regional wall motion abnormality, was aggressively hydrated, CT of the head showed no acute findings.  Relates that since discharge he has been feeling weak having more headaches.  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 Oncology will arrange outpatient appointment with radiation oncology and referred for Port-A-Cath placement as an outpatient. Status post biopsy on 05/19/2023. Oncology consulted recommended CT of the chest with contrast, EBV DNA. PET scan and radiation oncology consult as an outpatient. Use morphine for pain, will need to go home on narcotics and meclizine for dizziness.  Normocytic anemia: After speaking to him because of the drop in hemoglobin the patient relates melanotic stools about 1 weeks prior to admission.  Went back to his hemoglobin on 05/11/2023 hemoglobin was 13 now in the hospital is 9.1. Hold DVT prophylaxis consult GI.  HIV disease: Continue HAART therapy, continue dapsone.  Hypothyroidism: Continue Synthroid.  DVT prophylaxis: lovenox Family Communication:none Status is: Inpatient Remains inpatient appropriate because: nasopharengeal mass     Code Status:     Code Status Orders  (From admission, onward)           Start     Ordered   05/17/23 1905   Full code  Continuous       Question:  By:  Answer:  Consent: discussion documented in EHR   05/17/23 1906           Code Status History     Date Active Date Inactive Code Status Order ID Comments User Context   05/17/2023 1532 05/17/2023 1906 Full Code [id number]  Nolberto Hanlon, MD ED   05/11/2023 2024 05/12/2023 2308 Full Code [id number]  Hillary Bow, DO Inpatient   07/01/2018 1624 07/03/2018 1537 Full Code [id number]  Dorcas Carrow, MD Inpatient   02/10/2014 2054 02/12/2014 0031 Full Code [id number]  Christen Bame, MD Inpatient      Advance Directive Documentation    Flowsheet Row Most Recent Value  Type of Advance Directive Healthcare Power of Attorney, Living will  Pre-existing out of facility DNR order (yellow form or pink MOST form) --  "MOST" Form in Place? --         IV Access:   Peripheral IV   Procedures and diagnostic studies:   CT CHEST W CONTRAST  Result Date: 05/20/2023 CLINICAL DATA:  Staging colon cancer. New nasopharyngeal mass. * Tracking Code: BO * EXAM: CT CHEST WITH CONTRAST TECHNIQUE: Multidetector CT imaging of the chest was performed during intravenous contrast administration. RADIATION DOSE REDUCTION: This exam was performed according to the departmental dose-optimization program which includes automated exposure control, adjustment of the mA and/or kV according to patient size and/or use of iterative reconstruction technique. CONTRAST:  50mL OMNIPAQUE IOHEXOL 350 MG/ML SOLN COMPARISON:  None Available. FINDINGS: Cardiovascular: Heart is nonenlarged. Coronary  artery calcifications are seen. Please correlate for other coronary risk factors. Diameter of the ascending aorta measures 4.4 by 4.4 cm at the level of the right pulmonary artery. More normal caliber arch and descending thoracic aorta. Mild vascular calcifications along the aorta. Mediastinum/Nodes: Preserved thyroid gland. Slightly patulous thoracic esophagus with some luminal air and fluid. No  specific abnormal lymph node enlargement present in the axillary region, hilum or mediastinum. A few small less than 1 cm size mediastinal and right hilar nodes are seen, nonpathologic by size criteria. Lungs/Pleura: No consolidation, pneumothorax or effusion. Slight breathing motion. Left lower lobe lung nodule identified measuring 8 by 3 mm on series 4, image 136. Similar focus in the right lung base measuring 7 mm on series 4 image 123. Few other tiny nodules in the right lung such as series 4, image 119 in the lower lobe. Few patchy areas of ground-glass type areas are seen as well. Apical pleural thickening identified with some paraseptal and centrilobular emphysematous changes. Some areas of interstitial septal thickening as well. Upper Abdomen: Multiple liver masses are identified. At least 15 lesions are seen. Example in segment 2 on series 3, image 147 measures 2.7 by 2.6 cm. Middle lobe lesion on series 3, image 174 measures 2.7 by 2.7 cm. Adrenal glands are preserved. There also abnormal nodes identified such as lesser curve of the stomach measuring 16 x 20 mm. Musculoskeletal: Mild degenerative changes along the spine. IMPRESSION: Numerous liver metastases. Upper abdominal abnormal nodes. Further workup of the abdomen and pelvis may be useful when clinically appropriate if there is no known previous. Patulous esophagus with slight wall thickening. Please correlate with symptoms and additional evaluation when clinically appropriate. Small mediastinal lymph nodes.  Attention on follow-up. Small lung nodules measuring up to 8 mm. Recommend follow up in 3 months or as per the patient's primary neoplasm. Coronary artery calcifications. In addition dilatation of the ascending aorta. Recommend annual imaging followup by CTA or MRA. This recommendation follows 2010 ACCF/AHA/AATS/ACR/ASA/SCA/SCAI/SIR/STS/SVM Guidelines for the Diagnosis and Management of Patients with Thoracic Aortic Disease. Circulation. 2010;  121: A416-S063. Aortic aneurysm NOS (ICD10-I71.9) Aortic Atherosclerosis (ICD10-I70.0) and Emphysema (ICD10-J43.9). Electronically Signed   By: Karen Kays M.D.   On: 05/20/2023 14:09     Medical Consultants:   None.   Subjective:    Jordan Calderon relates his dizziness and his pain is better. He is not no further melanotic stools.  Objective:    Vitals:   05/20/23 2032 05/20/23 2351 05/21/23 0534 05/21/23 0815  BP: 132/83 (!) 146/91 137/86 116/83  Pulse: 70 83 70 86  Resp: 19 18 18 19   Temp: 98.1 F (36.7 C) 97.7 F (36.5 C) (!) 97.5 F (36.4 C) 98.1 F (36.7 C)  TempSrc: Oral Oral Oral Oral  SpO2: 94% 94% 93% 97%  Weight:      Height:       SpO2: 97 % O2 Flow Rate (L/min): 5 L/min   Intake/Output Summary (Last 24 hours) at 05/21/2023 0949 Last data filed at 05/20/2023 2200 Gross per 24 hour  Intake 1026.21 ml  Output 1275 ml  Net -248.79 ml   Filed Weights   05/17/23 1157 05/17/23 1736 05/19/23 1031  Weight: 74.6 kg 72.5 kg 72.6 kg    Exam: General exam: In no acute distress. Respiratory system: Good air movement and clear to auscultation. Cardiovascular system: S1 & S2 heard, RRR. No JVD. Gastrointestinal system: Abdomen is nondistended, soft and nontender.  Extremities: No pedal edema. Skin: No  rashes, lesions or ulcers Psychiatry: Judgement and insight appear normal. Mood & affect appropriate. Data Reviewed:    Labs: Basic Metabolic Panel: Recent Labs  Lab 05/17/23 1232 05/17/23 1741 05/18/23 0452  NA 135  --  132*  K 4.3  --  4.1  CL 99  --  104  CO2 25  --  22  GLUCOSE 110*  --  103*  BUN 20  --  17  CREATININE 1.17 1.15 1.08  CALCIUM 9.9  --  8.4*   GFR Estimated Creatinine Clearance: 70 mL/min (by C-G formula based on SCr of 1.08 mg/dL). Liver Function Tests: Recent Labs  Lab 05/18/23 0452  AST 24  ALT 26  ALKPHOS 87  BILITOT 0.5  PROT 7.0  ALBUMIN 3.1*   No results for input(s): "LIPASE", "AMYLASE" in the last 168  hours. No results for input(s): "AMMONIA" in the last 168 hours. Coagulation profile No results for input(s): "INR", "PROTIME" in the last 168 hours. COVID-19 Labs  No results for input(s): "DDIMER", "FERRITIN", "LDH", "CRP" in the last 72 hours.  Lab Results  Component Value Date   SARSCOV2NAA NEGATIVE 05/11/2023    CBC: Recent Labs  Lab 05/17/23 1232 05/17/23 1741 05/18/23 0452  WBC 8.7 9.6 6.9  HGB 10.2* 9.8* 9.1*  HCT 30.3* 28.9* 27.2*  MCV 96.2 95.1 96.5  PLT 261 250 215   Cardiac Enzymes: No results for input(s): "CKTOTAL", "CKMB", "CKMBINDEX", "TROPONINI" in the last 168 hours. BNP (last 3 results) No results for input(s): "PROBNP" in the last 8760 hours. CBG: No results for input(s): "GLUCAP" in the last 168 hours.  D-Dimer: No results for input(s): "DDIMER" in the last 72 hours. Hgb A1c: No results for input(s): "HGBA1C" in the last 72 hours.  Lipid Profile: No results for input(s): "CHOL", "HDL", "LDLCALC", "TRIG", "CHOLHDL", "LDLDIRECT" in the last 72 hours.  Thyroid function studies: No results for input(s): "TSH", "T4TOTAL", "T3FREE", "THYROIDAB" in the last 72 hours.  Invalid input(s): "FREET3" Anemia work up: No results for input(s): "VITAMINB12", "FOLATE", "FERRITIN", "TIBC", "IRON", "RETICCTPCT" in the last 72 hours. Sepsis Labs: Recent Labs  Lab 05/17/23 1232 05/17/23 1741 05/18/23 0452  WBC 8.7 9.6 6.9   Microbiology Recent Results (from the past 240 hour(s))  Group A Strep by PCR     Status: None   Collection Time: 05/11/23 10:35 AM   Specimen: Throat; Sterile Swab  Result Value Ref Range Status   Group A Strep by PCR NOT DETECTED NOT DETECTED Final    Comment: Performed at [facility], [address] [place], [place] [address]  Resp panel by RT-PCR (RSV, Flu A&B, Covid) Throat     Status: None   Collection Time: 05/11/23 10:36 AM   Specimen: Throat; Nasal Swab  Result Value Ref Range Status   SARS Coronavirus 2 by RT  PCR NEGATIVE NEGATIVE Final    Comment: (NOTE) SARS-CoV-2 target nucleic acids are NOT DETECTED.  The SARS-CoV-2 RNA is generally detectable in upper respiratory specimens during the acute phase of infection. The lowest concentration of SARS-CoV-2 viral copies this assay can detect is 138 copies/mL. A negative result does not preclude SARS-Cov-2 infection and should not be used as the sole basis for treatment or other patient management decisions. A negative result may occur with  improper specimen collection/handling, submission of specimen other than nasopharyngeal swab, presence of viral mutation(s) within the areas targeted by this assay, and inadequate number of viral copies(<138 copies/mL). A negative result must be  combined with clinical observations, patient history, and epidemiological information. The expected result is Negative.  Fact Sheet for Patients:  BloggerCourse.com  Fact Sheet for Healthcare Providers:  SeriousBroker.it  This test is no t yet approved or cleared by the [place] FDA and  has been authorized for detection and/or diagnosis of SARS-CoV-2 by FDA under an Emergency Use Authorization (EUA). This EUA will remain  in effect (meaning this test can be used) for the duration of the COVID-19 declaration under Section 564(b)(1) of the Act, 21 U.S.C.section 360bbb-3(b)(1), unless the authorization is terminated  or revoked sooner.       Influenza A by PCR NEGATIVE NEGATIVE Final   Influenza B by PCR NEGATIVE NEGATIVE Final    Comment: (NOTE) The Xpert Xpress SARS-CoV-2/FLU/RSV plus assay is intended as an aid in the diagnosis of influenza from Nasopharyngeal swab specimens and should not be used as a sole basis for treatment. Nasal washings and aspirates are unacceptable for Xpert Xpress SARS-CoV-2/FLU/RSV testing.  Fact Sheet for Patients: BloggerCourse.com  Fact Sheet for  Healthcare Providers: SeriousBroker.it  This test is not yet approved or cleared by the [place] FDA and has been authorized for detection and/or diagnosis of SARS-CoV-2 by FDA under an Emergency Use Authorization (EUA). This EUA will remain in effect (meaning this test can be used) for the duration of the COVID-19 declaration under Section 564(b)(1) of the Act, 21 U.S.C. section 360bbb-3(b)(1), unless the authorization is terminated or revoked.     Resp Syncytial Virus by PCR NEGATIVE NEGATIVE Final    Comment: (NOTE) Fact Sheet for Patients: BloggerCourse.com  Fact Sheet for Healthcare Providers: SeriousBroker.it  This test is not yet approved or cleared by the [place] FDA and has been authorized for detection and/or diagnosis of SARS-CoV-2 by FDA under an Emergency Use Authorization (EUA). This EUA will remain in effect (meaning this test can be used) for the duration of the COVID-19 declaration under Section 564(b)(1) of the Act, 21 U.S.C. section 360bbb-3(b)(1), unless the authorization is terminated or revoked.  Performed at [facility], [address] [place], [place] [address]   Respiratory (~20 pathogens) panel by PCR     Status: None   Collection Time: 05/11/23 10:48 PM   Specimen: Nasopharyngeal Swab; Respiratory  Result Value Ref Range Status   Adenovirus NOT DETECTED NOT DETECTED Final   Coronavirus 229E NOT DETECTED NOT DETECTED Final    Comment: (NOTE) The Coronavirus on the Respiratory Panel, DOES NOT test for the novel  Coronavirus (2019 nCoV)    Coronavirus HKU1 NOT DETECTED NOT DETECTED Final   Coronavirus NL63 NOT DETECTED NOT DETECTED Final   Coronavirus OC43 NOT DETECTED NOT DETECTED Final   Metapneumovirus NOT DETECTED NOT DETECTED Final   Rhinovirus / Enterovirus NOT DETECTED NOT DETECTED Final   Influenza A NOT DETECTED NOT DETECTED Final    Influenza B NOT DETECTED NOT DETECTED Final   Parainfluenza Virus 1 NOT DETECTED NOT DETECTED Final   Parainfluenza Virus 2 NOT DETECTED NOT DETECTED Final   Parainfluenza Virus 3 NOT DETECTED NOT DETECTED Final   Parainfluenza Virus 4 NOT DETECTED NOT DETECTED Final   Respiratory Syncytial Virus NOT DETECTED NOT DETECTED Final   Bordetella pertussis NOT DETECTED NOT DETECTED Final   Bordetella Parapertussis NOT DETECTED NOT DETECTED Final   Chlamydophila pneumoniae NOT DETECTED NOT DETECTED Final   Mycoplasma pneumoniae NOT DETECTED NOT DETECTED Final    Comment: Performed at [facility], [address],  [place], [place] [address]  Blood culture (routine x 2)     Status: None (Preliminary result)   Collection Time: 05/17/23 12:13 PM   Specimen: BLOOD  Result Value Ref Range Status   Specimen Description   Final    BLOOD LEFT ANTECUBITAL Performed at [facility], [address] [place], [place] [address]    Special Requests   Final    BOTTLES DRAWN AEROBIC AND ANAEROBIC Blood Culture adequate volume Performed at [facility], [address] [place], [place] [address]    Culture   Final    NO GROWTH 4 DAYS Performed at [facility], [address], [place], [place] [address]    Report Status PENDING  Incomplete  Blood culture (routine x 2)     Status: None (Preliminary result)   Collection Time: 05/17/23 12:18 PM   Specimen: BLOOD RIGHT HAND  Result Value Ref Range Status   Specimen Description   Final    BLOOD RIGHT HAND Performed at [facility], [address] [place], [place] [address]    Special Requests   Final    BOTTLES DRAWN AEROBIC AND ANAEROBIC Blood Culture adequate volume Performed at [facility], [address] [place], [place] [address]    Culture   Final    NO GROWTH 4 DAYS Performed at [facility], [address], [place], [place] [address]    Report Status PENDING  Incomplete  Surgical pcr  screen     Status: None   Collection Time: 05/18/23  8:38 PM   Specimen: Nasal Mucosa; Nasal Swab  Result Value Ref Range Status   MRSA, PCR NEGATIVE NEGATIVE Final   Staphylococcus aureus NEGATIVE NEGATIVE Final    Comment: (NOTE) The Xpert SA Assay (FDA approved for NASAL specimens in patients 71 years of age and older), is one component of a comprehensive surveillance program. It is not intended to diagnose infection nor to guide or monitor treatment. Performed at [facility], [address] [place], [place] [address]      Medications:    calcium carbonate  1 tablet Oral TID WC   dapsone  100 mg Oral Q breakfast   Darunavir-Cobicistat-Emtricitabine-Tenofovir Alafenamide  1 tablet Oral Q breakfast   enoxaparin (LOVENOX) injection  40 mg Subcutaneous Q24H   Continuous Infusions:      LOS: 4 days   Marinda Elk  [facility]  05/21/2023, 9:49 AM

## 2023-05-21 NOTE — Plan of Care (Signed)
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
## 2023-05-21 NOTE — Progress Notes (Signed)
Pt. sister Lysle Dingwall called reporting Pt. 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 He's schedule for  outpt. HRA with [facility] for a procedure.

## 2023-05-21 NOTE — Plan of Care (Signed)
  Problem: Education: Goal: Knowledge of General Education information will improve Description: Including pain rating scale, medication(s)/side effects and non-pharmacologic comfort measures Outcome: Progressing   Problem: Clinical Measurements: Goal: Ability to maintain clinical measurements within normal limits will improve Outcome: Progressing Goal: Respiratory complications will improve Outcome: Progressing Goal: Cardiovascular complication will be avoided Outcome: Progressing   Problem: Activity: Goal: Risk for activity intolerance will decrease Outcome: Progressing   Problem: Elimination: Goal: Will not experience complications related to urinary retention Outcome: Progressing   Problem: Clinical Measurements: Goal: Diagnostic test results will improve Outcome: Not Progressing   Problem: Elimination: Goal: Will not experience complications related to bowel motility Outcome: Not Progressing   Problem: Pain Management: Goal: General experience of comfort will improve Outcome: Not Progressing

## 2023-05-22 ENCOUNTER — Inpatient Hospital Stay (HOSPITAL_COMMUNITY): Payer: Medicare Other | Admitting: Certified Registered"

## 2023-05-22 ENCOUNTER — Encounter (HOSPITAL_COMMUNITY): Payer: Self-pay | Admitting: Internal Medicine

## 2023-05-22 ENCOUNTER — Encounter (HOSPITAL_COMMUNITY)
Admission: EM | Disposition: A | Discharge status: Home Health | Payer: Self-pay | Source: Home / Self Care | Attending: Internal Medicine

## 2023-05-22 DIAGNOSIS — K3189 Other diseases of stomach and duodenum: Secondary | ICD-10-CM | POA: Diagnosis not present

## 2023-05-22 DIAGNOSIS — C8599 Non-Hodgkin lymphoma, unspecified, extranodal and solid organ sites: Secondary | ICD-10-CM

## 2023-05-22 DIAGNOSIS — B3781 Candidal esophagitis: Secondary | ICD-10-CM | POA: Diagnosis not present

## 2023-05-22 DIAGNOSIS — K259 Gastric ulcer, unspecified as acute or chronic, without hemorrhage or perforation: Secondary | ICD-10-CM

## 2023-05-22 DIAGNOSIS — K209 Esophagitis, unspecified without bleeding: Secondary | ICD-10-CM

## 2023-05-22 DIAGNOSIS — R42 Dizziness and giddiness: Secondary | ICD-10-CM | POA: Diagnosis not present

## 2023-05-22 DIAGNOSIS — K449 Diaphragmatic hernia without obstruction or gangrene: Secondary | ICD-10-CM

## 2023-05-22 DIAGNOSIS — K2270 Barrett's esophagus without dysplasia: Secondary | ICD-10-CM

## 2023-05-22 DIAGNOSIS — K921 Melena: Secondary | ICD-10-CM

## 2023-05-22 DIAGNOSIS — K250 Acute gastric ulcer with hemorrhage: Secondary | ICD-10-CM

## 2023-05-22 HISTORY — PX: ESOPHAGOGASTRODUODENOSCOPY (EGD) WITH PROPOFOL: SHX5813

## 2023-05-22 HISTORY — PX: BIOPSY: SHX5522

## 2023-05-22 LAB — BASIC METABOLIC PANEL
Anion gap: 7 (ref 5–15)
BUN: 26 mg/dL — ABNORMAL HIGH (ref 8–23)
CO2: 25 mmol/L (ref 22–32)
Calcium: 8.8 mg/dL — ABNORMAL LOW (ref 8.9–10.3)
Chloride: 104 mmol/L (ref 98–111)
Creatinine, Ser: 1.11 mg/dL (ref 0.61–1.24)
GFR, Estimated: >60 mL/min (ref >60)
Glucose, Bld: 104 mg/dL — ABNORMAL HIGH (ref 70–99)
Potassium: 4.7 mmol/L (ref 3.5–5.1)
Sodium: 136 mmol/L (ref 135–145)

## 2023-05-22 LAB — CBC
HCT: 28.9 % — ABNORMAL LOW (ref 39.0–52.0)
Hemoglobin: 9.4 g/dL — ABNORMAL LOW (ref 13.0–17.0)
MCH: 31.8 pg (ref 26.0–34.0)
MCHC: 32.5 g/dL (ref 30.0–36.0)
MCV: 97.6 fL (ref 80.0–100.0)
Platelets: 285 10*3/uL (ref 150–400)
RBC: 2.96 MIL/uL — ABNORMAL LOW (ref 4.22–5.81)
RDW: 13.1 % (ref 11.5–15.5)
WBC: 10.5 10*3/uL (ref 4.0–10.5)
nRBC: 0 % (ref 0.0–0.2)

## 2023-05-22 LAB — CULTURE, BLOOD (ROUTINE X 2)
Culture: NO GROWTH
Culture: NO GROWTH
Special Requests: ADEQUATE
Special Requests: ADEQUATE

## 2023-05-22 LAB — SURGICAL PATHOLOGY

## 2023-05-22 LAB — EPSTEIN BARR VRS(EBV DNA BY PCR): EBV DNA QN by PCR: NEGATIVE [IU]/mL

## 2023-05-22 SURGERY — ESOPHAGOGASTRODUODENOSCOPY (EGD) WITH PROPOFOL
Anesthesia: Monitor Anesthesia Care

## 2023-05-22 MED ORDER — LIDOCAINE 2% (20 MG/ML) 5 ML SYRINGE
INTRAMUSCULAR | Status: DC | PRN
  Administered 2023-05-22: 80 mg via INTRAVENOUS

## 2023-05-22 MED ORDER — PROPOFOL 500 MG/50ML IV EMUL
INTRAVENOUS | Status: DC | PRN
  Administered 2023-05-22: 100 ug/kg/min via INTRAVENOUS

## 2023-05-22 MED ORDER — SODIUM CHLORIDE 0.9 % IV SOLN: INTRAVENOUS | Status: DC | PRN

## 2023-05-22 MED ORDER — TRAZODONE HCL 50 MG PO TABS
50.0000 mg | ORAL_TABLET | Freq: Every evening | ORAL | Status: DC | PRN
  Administered 2023-05-22 – 2023-05-23 (×3): 50 mg via ORAL
  Filled 2023-05-22 (×3): qty 1

## 2023-05-22 MED ORDER — FLUCONAZOLE 200 MG PO TABS
400.0000 mg | ORAL_TABLET | Freq: Once | ORAL | Status: AC
  Administered 2023-05-22: 400 mg via ORAL
  Filled 2023-05-22: qty 2

## 2023-05-22 MED ORDER — PROPOFOL 10 MG/ML IV BOLUS
INTRAVENOUS | Status: DC | PRN
  Administered 2023-05-22: 70 mg via INTRAVENOUS
  Administered 2023-05-22: 30 mg via INTRAVENOUS

## 2023-05-22 MED ORDER — FLUCONAZOLE 200 MG PO TABS
200.0000 mg | ORAL_TABLET | Freq: Every day | ORAL | Status: DC
  Administered 2023-05-23 – 2023-05-24 (×2): 200 mg via ORAL
  Filled 2023-05-22 (×2): qty 1

## 2023-05-22 MED ORDER — PANTOPRAZOLE SODIUM 40 MG PO TBEC
40.0000 mg | DELAYED_RELEASE_TABLET | Freq: Two times a day (BID) | ORAL | Status: DC
  Administered 2023-05-22 – 2023-05-23 (×2): 40 mg via ORAL
  Filled 2023-05-22 (×2): qty 1

## 2023-05-22 SURGICAL SUPPLY — 14 items

## 2023-05-22 NOTE — Interval H&P Note (Signed)
History and Physical Interval Note: Patient here for EGD today - history of dark stools, anemia. No prior EGD or colonoscopy. 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 CT shows esophageal wall thickening. EGD to exclude upper GI tract cause for dark stools / anemia. I have discussed risks / benefits he wishes to proceed, all questions answered.   05/22/2023 10:14 AM  Jordan Calderon Parents  has presented today for surgery, with the diagnosis of black stools and drop in Hgb.  The various methods of treatment have been discussed with the patient and family. After consideration of risks, benefits and other options for treatment, the patient has consented to  Procedure(s): ESOPHAGOGASTRODUODENOSCOPY (EGD) WITH PROPOFOL (N/A) as a surgical intervention.  The patient's history has been reviewed, patient examined, no change in status, stable for surgery.  I have reviewed the patient's chart and labs.  Questions were answered to the patient's satisfaction.     Jordan Calderon P Jordan Calderon

## 2023-05-22 NOTE — Plan of Care (Signed)
  Problem: Education: Goal: Knowledge of General Education information will improve Description: Including pain rating scale, medication(s)/side effects and non-pharmacologic comfort measures Outcome: Progressing   Problem: Clinical Measurements: Goal: Ability to maintain clinical measurements within normal limits will improve Outcome: Progressing Goal: Will remain free from infection Outcome: Progressing Goal: Respiratory complications will improve Outcome: Progressing Goal: Cardiovascular complication will be avoided Outcome: Progressing   Problem: Activity: Goal: Risk for activity intolerance will decrease Outcome: Progressing   Problem: Elimination: Goal: Will not experience complications related to urinary retention Outcome: Progressing   Problem: Clinical Measurements: Goal: Diagnostic test results will improve Outcome: Not Progressing   Problem: Elimination: Goal: Will not experience complications related to bowel motility Outcome: Not Progressing

## 2023-05-22 NOTE — Consult Note (Signed)
Chief Complaint: Patient was seen in consultation today for liver lesion biopsy   Referring Physician(s): Dr. Arlana Pouch  Supervising Physician: Ruel Favors  Patient Status: [facility] - In-pt  History of Present Illness: Jordan Calderon is a 62 y.o. male with past medical history significant for HIV. C/o weakness and headaches. 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  Has also had some melena and anemia. Under went endoscopy earlier today which finds gastric ulcer and esophagitis, some biopsies obtained. Further imaging also finds multiple liver lesions concerning for metastatic process.  PMHx, meds, labs, imaging, allergies reviewed. Feels okay, still a little sleepy from recent sedation for endoscopy.   Past Medical History:  Diagnosis Date   Anxiety    Cellulitis 02/11/2014   LEFT ARM   Depression    Headache(784.0)    HIV (human immunodeficiency virus infection) (HCC)     Past Surgical History:  Procedure Laterality Date   ARM HARDWARE REMOVAL Left    arm surgery     HEMORRHOID SURGERY     NASOPHARYNGEAL BIOPSY N/A 05/19/2023   Procedure: NASOPHARYNGEAL BIOPSY;  Surgeon: Newman Pies, MD;  Location: [facility] OR;  Service: ENT;  Laterality: N/A;    Allergies: Sulfonamide derivatives  Medications:  Current Facility-Administered Medications:    acetaminophen (TYLENOL) tablet 650 mg, 650 mg, Oral, Q6H PRN **OR** acetaminophen (TYLENOL) suppository 650 mg, 650 mg, Rectal, Q6H PRN, Rometta Emery, MD   calcium carbonate (TUMS - dosed in mg elemental calcium) chewable tablet 200 mg of elemental calcium, 1 tablet, Oral, TID WC, Garba, Mohammad L, MD, 200 mg of elemental calcium at 05/22/23 1233   dapsone tablet 100 mg, 100 mg, Oral, Q breakfast, Garba, Mohammad L, MD, 100 mg at 05/22/23 1233   Darunavir-Cobicistat-Emtricitabine-Tenofovir Alafenamide (SYMTUZA) 800-150-200-10 MG TABS 1 tablet, 1 tablet, Oral, Q breakfast, Mikeal Hawthorne, Mohammad L, MD,  1 tablet at 05/22/23 1233   [START ON 05/23/2023] fluconazole (DIFLUCAN) tablet 200 mg, 200 mg, Oral, Daily, Wouk, Wilfred Curtis, MD   LORazepam (ATIVAN) injection 1 mg, 1 mg, Intravenous, Q4H PRN, Marinda Elk, MD, 1 mg at 05/21/23 2232   meclizine (ANTIVERT) tablet 25 mg, 25 mg, Oral, TID PRN, Mikeal Hawthorne, Mohammad L, MD   ondansetron (ZOFRAN) tablet 4 mg, 4 mg, Oral, Q6H PRN **OR** ondansetron (ZOFRAN) injection 4 mg, 4 mg, Intravenous, Q6H PRN, Mikeal Hawthorne, Mohammad L, MD, 4 mg at 05/19/23 1142   pantoprazole (PROTONIX) EC tablet 40 mg, 40 mg, Oral, BID, Wouk, Wilfred Curtis, MD   senna-docusate (Senokot-S) tablet 1 tablet, 1 tablet, Oral, QHS PRN, Nolberto Hanlon, MD, 1 tablet at 05/22/23 0115   traZODone (DESYREL) tablet 50 mg, 50 mg, Oral, QHS PRN, Howerter, Justin B, DO, 50 mg at 05/22/23 0114    Family History  Adopted: Yes    Social History   Socioeconomic History   Marital status: Single    Spouse name: Not on file   Number of children: Not on file   Years of education: Not on file   Highest education level: Not on file  Occupational History   Not on file  Tobacco Use   Smoking status: Former    Current packs/day: 0.00    Average packs/day: 0.1 packs/day for 30.0 years (3.0 ttl pk-yrs)    Types: Cigarettes    Start date: 11/18/1984    Quit date: 11/19/2014    Years since quitting: 8.5   Smokeless tobacco: Never  Vaping Use   Vaping status:  Never Used  Substance and Sexual Activity   Alcohol use: No    Alcohol/week: 0.0 standard drinks of alcohol   Drug use: Not Currently   Sexual activity: Not Currently    Comment: refused condoms  Other Topics Concern   Not on file  Social History Narrative   Not on file   Social Determinants of Health   Financial Resource Strain: Not on file  Food Insecurity: No Food Insecurity (05/17/2023)   Hunger Vital Sign    Worried About Running Out of Food in the Last Year: Never true    Ran Out of Food in the Last Year: Never true   Transportation Needs: No Transportation Needs (05/17/2023)   PRAPARE - Administrator, Civil Service (Medical): No    Lack of Transportation (Non-Medical): No  Physical Activity: Not on file  Stress: Not on file  Social Connections: Not on file    Review of Systems: A 12 point ROS discussed and pertinent positives are indicated in the HPI above.  All other systems are negative.  Review of Systems  Vital Signs: BP (!) 134/99   Pulse 61   Temp 98 F (36.7 C) (Temporal)   Resp 17   Ht 5\' 9"  (1.753 m)   Wt 160 lb (72.6 kg)   SpO2 98%   BMI 23.63 kg/m   Physical Exam Constitutional:      General: He is not in acute distress.    Appearance: He is not ill-appearing.  HENT:     Mouth/Throat:     Mouth: Mucous membranes are moist.     Pharynx: Oropharynx is clear.  Cardiovascular:     Rate and Rhythm: Normal rate and regular rhythm.     Heart sounds: Normal heart sounds.  Pulmonary:     Effort: Pulmonary effort is normal. No respiratory distress.     Breath sounds: Normal breath sounds.  Abdominal:     General: There is no distension.     Palpations: Abdomen is soft. There is no mass.     Tenderness: There is no abdominal tenderness.  Skin:    General: Skin is dry.     Coloration: Skin is not jaundiced.  Neurological:     General: No focal deficit present.     Comments: A little sleepy from recent sedation but able to complete discussion regarding biopsy.      Imaging: CT CHEST W CONTRAST  Result Date: 05/20/2023 CLINICAL DATA:  Staging colon cancer. New nasopharyngeal mass. * Tracking Code: BO * EXAM: CT CHEST WITH CONTRAST TECHNIQUE: Multidetector CT imaging of the chest was performed during intravenous contrast administration. RADIATION DOSE REDUCTION: This exam was performed according to the departmental dose-optimization program which includes automated exposure control, adjustment of the mA and/or kV according to patient size and/or use of iterative  reconstruction technique. CONTRAST:  50mL OMNIPAQUE IOHEXOL 350 MG/ML SOLN COMPARISON:  None Available. FINDINGS: Cardiovascular: Heart is nonenlarged. Coronary artery calcifications are seen. Please correlate for other coronary risk factors. Diameter of the ascending aorta measures 4.4 by 4.4 cm at the level of the right pulmonary artery. More normal caliber arch and descending thoracic aorta. Mild vascular calcifications along the aorta. Mediastinum/Nodes: Preserved thyroid gland. Slightly patulous thoracic esophagus with some luminal air and fluid. No specific abnormal lymph node enlargement present in the axillary region, hilum or mediastinum. A few small less than 1 cm size mediastinal and right hilar nodes are seen, nonpathologic by size criteria. Lungs/Pleura: No consolidation, pneumothorax  or effusion. Slight breathing motion. Left lower lobe lung nodule identified measuring 8 by 3 mm on series 4, image 136. Similar focus in the right lung base measuring 7 mm on series 4 image 123. Few other tiny nodules in the right lung such as series 4, image 119 in the lower lobe. Few patchy areas of ground-glass type areas are seen as well. Apical pleural thickening identified with some paraseptal and centrilobular emphysematous changes. Some areas of interstitial septal thickening as well. Upper Abdomen: Multiple liver masses are identified. At least 15 lesions are seen. Example in segment 2 on series 3, image 147 measures 2.7 by 2.6 cm. Middle lobe lesion on series 3, image 174 measures 2.7 by 2.7 cm. Adrenal glands are preserved. There also abnormal nodes identified such as lesser curve of the stomach measuring 16 x 20 mm. Musculoskeletal: Mild degenerative changes along the spine. IMPRESSION: Numerous liver metastases. Upper abdominal abnormal nodes. Further workup of the abdomen and pelvis may be useful when clinically appropriate if there is no known previous. Patulous esophagus with slight wall thickening. Please  correlate with symptoms and additional evaluation when clinically appropriate. Small mediastinal lymph nodes.  Attention on follow-up. Small lung nodules measuring up to 8 mm. Recommend follow up in 3 months or as per the patient's primary neoplasm. Coronary artery calcifications. In addition dilatation of the ascending aorta. Recommend annual imaging followup by CTA or MRA. This recommendation follows 2010 ACCF/AHA/AATS/ACR/ASA/SCA/SCAI/SIR/STS/SVM Guidelines for the Diagnosis and Management of Patients with Thoracic Aortic Disease. Circulation. 2010; 121: U045-W098. Aortic aneurysm NOS (ICD10-I71.9) Aortic Atherosclerosis (ICD10-I70.0) and Emphysema (ICD10-J43.9). Electronically Signed   By: Karen Kays M.D.   On: 05/20/2023 14:09   CT SOFT TISSUE NECK W CONTRAST  Result Date: 05/18/2023 CLINICAL DATA:  Neck mass EXAM: CT NECK WITH CONTRAST TECHNIQUE: Multidetector CT imaging of the neck was performed using the standard protocol following the bolus administration of intravenous contrast. RADIATION DOSE REDUCTION: This exam was performed according to the departmental dose-optimization program which includes automated exposure control, adjustment of the mA and/or kV according to patient size and/or use of iterative reconstruction technique. CONTRAST:  75mL OMNIPAQUE IOHEXOL 350 MG/ML SOLN COMPARISON:  CTA head/neck 05/17/23 FINDINGS: Pharynx and larynx: There is a large right nasopharyngeal mass that extends inferiorly to the level the greater horn of the hyoid bone on right. This mass involves the right-sided adenoid and palatine tonsils, the right parapharyngeal space, the right masticator space, the right parotid space. Superiorly this mass likely extends to the level of the foramen ovale bilaterally. Salivary glands: The lateral extension of the large nasopharyngeal mass abuts the right parotid gland in the superior aspect of the right submandibular gland. There is a 9 mm hypodense lesion in the left  parotid gland. Thyroid: Normal. Lymph nodes: There is a 9 mm hypodense lesion in the left parotid gland (series 2, image 46). It is unclear if this represents a necrotic lymph node or an intraparotid cyst. Vascular: Negative. Limited intracranial: Negative. Visualized orbits: Negative. Mastoids and visualized paranasal sinuses: No middle ear or mastoid effusion. Paranasal sinuses are clear. Skeleton: No acute or aggressive process. Upper chest: Negative. Other: None IMPRESSION: 1. Large right nasopharyngeal mass that extends inferiorly to the level the greater horn of the hyoid bone on the right. This mass involves the right-sided adenoid and palatine tonsils, the right parapharyngeal space, the right masticator space, the right parotid space. Superiorly this mass likely extends to the level of the foramen ovale bilaterally. 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 Recommend ENT consultation. 2. A 9 mm hypodense lesion in the left parotid gland. It is unclear if this represents a necrotic lymph node or an intraparotid cyst. Electronically Signed   By: Lorenza Cambridge M.D.   On: 05/18/2023 11:31   MR ANGIO HEAD WO CONTRAST  Result Date: 05/17/2023 CLINICAL DATA:  Initial evaluation for persistent dizziness. EXAM: MRA HEAD WITHOUT CONTRAST TECHNIQUE: Angiographic images of the Circle of Willis were acquired using MRA technique without intravenous contrast. COMPARISON:  CTA from earlier the same day. FINDINGS: Anterior circulation: Both internal carotid arteries are widely patent through the siphons without stenosis or other abnormality. Again seen is a 3 mm outpouching extending posteriorly and laterally from the left ICA terminus (series 2, image 93). Possible small vessel emanating from its apex, not entirely certain. Finding could reflect an vascular infundibulum versus aneurysm. A1 segments patent bilaterally, with the right dominant. Normal anterior communicating artery complex. Anterior  cerebral arteries patent without stenosis. No M1 stenosis or occlusion. Distal MCA branches perfused and symmetric. Posterior circulation: Visualized V4 segments patent without stenosis. Right PICA patent. Left PICA origin not seen. Basilar patent without stenosis. Superior cerebellar and posterior cerebral arteries widely patent bilaterally. Anatomic variants: As above. Other: Previously identified mass at the right skull base noted, partially visualized, better evaluated on prior CT. IMPRESSION: 1. 3 mm outpouching extending posteriorly and laterally from the left ICA terminus, potentially reflecting an vascular infundibulum versus aneurysm. Attention at follow-up recommended. 2. Otherwise normal intracranial MRA. No large vessel occlusion or other emergent finding. Electronically Signed   By: Rise Mu M.D.   On: 05/17/2023 20:58   CT ANGIO HEAD NECK W WO CM  Result Date: 05/17/2023 CLINICAL DATA:  Cerebral vasospasm suspected EXAM: CT ANGIOGRAPHY HEAD AND NECK WITH AND WITHOUT CONTRAST TECHNIQUE: Multidetector CT imaging of the head and neck was performed using the standard protocol during bolus administration of intravenous contrast. Multiplanar CT image reconstructions and MIPs were obtained to evaluate the vascular anatomy. Carotid stenosis measurements (when applicable) are obtained utilizing NASCET criteria, using the distal internal carotid diameter as the denominator. RADIATION DOSE REDUCTION: This exam was performed according to the departmental dose-optimization program which includes automated exposure control, adjustment of the mA and/or kV according to patient size and/or use of iterative reconstruction technique. CONTRAST:  75mL OMNIPAQUE IOHEXOL 350 MG/ML SOLN COMPARISON:  None Available. FINDINGS: CT HEAD FINDINGS Brain: No evidence of acute infarction, hemorrhage, hydrocephalus, extra-axial collection or intraparenchymal mass lesion/mass effect. Vascular: See below. Skull: No  acute fracture. Sinuses/Orbits: Clear sinuses.  No acute orbital findings. Other: No mastoid effusions. Review of the MIP images confirms the above findings CTA NECK FINDINGS Aortic arch: Great vessel origins are patent without significant stenosis. Right carotid system: No evidence of dissection, stenosis (50% or greater), or occlusion. Left carotid system: No evidence of dissection, stenosis (50% or greater), or occlusion. Mild atherosclerosis at the carotid bifurcation. Vertebral arteries: Codominant. No evidence of dissection, stenosis (50% or greater), or occlusion. Skeleton: No acute abnormality on limited assessment. 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 Review of the MIP images confirms the above findings CTA HEAD FINDINGS Anterior circulation: Bilateral intracranial ICAs, MCAs, and ACAs are patent without proximal hemodynamically significant stenosis. Hypoplastic left A1 ACA, likely congenital. Approximately 2 mm posteriorly inferiorly directed outpouching arising from the left carotid terminus (for example see series 605, image 111 and series 604, image  154 Posterior circulation: Tortuous vertebral arteries. The vertebral arteries, basilar artery and bilateral posterior cerebral arteries are patent without proximal hemodynamically significant stenosis. Venous sinuses: As permitted by contrast timing, patent. Anatomic variants: Described above. Review of the MIP images confirms the above findings IMPRESSION: 1. 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 Recommend ENT consultation, correlation with direct inspection, and follow-up dedicated CT of the neck with contrast for better characterization. 2. No emergent large vessel occlusion or proximal hemodynamically significant stenosis. 3. Approximately 2 mm  posteriorly inferiorly directed outpouching arising from the left carotid terminus which could represent an infundibulum with vessel not well seen versus aneurysm. 4. Aortic Atherosclerosis (ICD10-I70.0) and Emphysema (ICD10-J43.9). Electronically Signed   By: Feliberto Harts M.D.   On: 05/17/2023 14:55   DG Chest Port 1 View  Result Date: 05/17/2023 CLINICAL DATA:  Worsening cough and dry heaving with shortness of breath EXAM: PORTABLE CHEST 1 VIEW COMPARISON:  Chest radiograph dated 05/11/2023 FINDINGS: Normal lung volumes. No focal consolidations. No pleural effusion or pneumothorax. The heart size and mediastinal contours are within normal limits. No acute osseous abnormality. IMPRESSION: No active disease. Electronically Signed   By: Agustin Cree M.D.   On: 05/17/2023 14:04   ECHOCARDIOGRAM COMPLETE  Result Date: 05/12/2023    ECHOCARDIOGRAM REPORT   Patient Name:   RIOT SIPP Pellicano Date of Exam: 05/12/2023 Medical Rec #:  [id number]       Height:       69.0 in Accession #:    [id number]      Weight:       154.6 lb Date of Birth:  07-05-1960       BSA:          1.852 m Patient Age:    62 years        BP:           124/67 mmHg Patient Gender: M               HR:           73 bpm. Exam Location:  Inpatient Procedure: 2D Echo, Color Doppler and Cardiac Doppler Indications:    Syncope  History:        Patient has no prior history of Echocardiogram examinations.                 Signs/Symptoms:Syncope; Risk Factors:HIV, Polysubstance Abuse                 and Former Smoker.  Sonographer:    Milbert Coulter Referring Phys: [id number] JARED M GARDNER IMPRESSIONS  1. Left ventricular ejection fraction, by estimation, is 60 to 65%. The left ventricle has normal function. The left ventricle has no regional wall motion abnormalities. Left ventricular diastolic parameters were normal.  2. Right ventricular systolic function is normal. The right ventricular size is normal. There is normal pulmonary artery systolic pressure.   3. The mitral valve is normal in structure. No evidence of mitral valve regurgitation. No evidence of mitral stenosis.  4. The aortic valve is tricuspid. There is mild calcification of the aortic valve. There is mild thickening of the aortic valve. Aortic valve regurgitation is not visualized. Aortic valve sclerosis/calcification is present, without any evidence of aortic stenosis.  5. Aortic dilatation noted. There is mild dilatation of the ascending aorta, measuring 40 mm.  6. The inferior vena cava is normal in size with greater than 50% respiratory variability,  suggesting right atrial pressure of 3 mmHg. Comparison(s): No prior Echocardiogram. Conclusion(s)/Recommendation(s): Otherwise normal echocardiogram, with minor abnormalities described in the report. Mild dilation of ascending aorta. FINDINGS  Left Ventricle: Left ventricular ejection fraction, by estimation, is 60 to 65%. The left ventricle has normal function. The left ventricle has no regional wall motion abnormalities. The left ventricular internal cavity size was normal in size. There is  no left ventricular hypertrophy. Left ventricular diastolic parameters were normal. Right Ventricle: The right ventricular size is normal. No increase in right ventricular wall thickness. Right ventricular systolic function is normal. There is normal pulmonary artery systolic pressure. The tricuspid regurgitant velocity is 2.54 m/s, and  with an assumed right atrial pressure of 3 mmHg, the estimated right ventricular systolic pressure is 28.8 mmHg. Left Atrium: Left atrial size was normal in size. Right Atrium: Right atrial size was normal in size. Prominent Chiari network. Pericardium: There is no evidence of pericardial effusion. Mitral Valve: The mitral valve is normal in structure. No evidence of mitral valve regurgitation. No evidence of mitral valve stenosis. Tricuspid Valve: The tricuspid valve is normal in structure. Tricuspid valve regurgitation is mild .  No evidence of tricuspid stenosis. Aortic Valve: The aortic valve is tricuspid. There is mild calcification of the aortic valve. There is mild thickening of the aortic valve. Aortic valve regurgitation is not visualized. Aortic valve sclerosis/calcification is present, without any evidence of aortic stenosis. Aortic valve mean gradient measures 4.0 mmHg. Aortic valve peak gradient measures 8.3 mmHg. Aortic valve area, by VTI measures 4.09 cm. Pulmonic Valve: The pulmonic valve was grossly normal. Pulmonic valve regurgitation is trivial. No evidence of pulmonic stenosis. Aorta: Aortic dilatation noted. There is mild dilatation of the ascending aorta, measuring 40 mm. Venous: The inferior vena cava is normal in size with greater than 50% respiratory variability, suggesting right atrial pressure of 3 mmHg. IAS/Shunts: No atrial level shunt detected by color flow Doppler.  LEFT VENTRICLE PLAX 2D LVIDd:         4.60 cm   Diastology LVIDs:         3.10 cm   LV e' medial:    7.18 cm/s LV PW:         1.10 cm   LV E/e' medial:  6.4 LV IVS:        1.20 cm   LV e' lateral:   16.00 cm/s LVOT diam:     2.40 cm   LV E/e' lateral: 2.8 LV SV:         94 LV SV Index:   51 LVOT Area:     4.52 cm  RIGHT VENTRICLE RV Basal diam:  3.00 cm RV Mid diam:    2.80 cm RV S prime:     13.60 cm/s TAPSE (M-mode): 2.0 cm LEFT ATRIUM             Index        RIGHT ATRIUM           Index LA diam:        3.80 cm 2.05 cm/m   RA Area:     13.10 cm LA Vol (A2C):   34.3 ml 18.52 ml/m  RA Volume:   29.50 ml  15.93 ml/m LA Vol (A4C):   45.1 ml 24.35 ml/m LA Biplane Vol: 40.7 ml 21.98 ml/m  AORTIC VALVE AV Area (Vmax):    3.96 cm AV Area (Vmean):   3.80 cm AV Area (VTI):     4.09 cm  AV Vmax:           144.00 cm/s AV Vmean:          94.700 cm/s AV VTI:            0.230 m AV Peak Grad:      8.3 mmHg AV Mean Grad:      4.0 mmHg LVOT Vmax:         126.00 cm/s LVOT Vmean:        79.600 cm/s LVOT VTI:          0.208 m LVOT/AV VTI ratio: 0.90  AORTA Ao  Root diam: 4.00 cm Ao Asc diam:  4.00 cm MITRAL VALVE               TRICUSPID VALVE MV Area (PHT): 2.33 cm    TR Peak grad:   25.8 mmHg MV Decel Time: 325 msec    TR Vmax:        254.00 cm/s MV E velocity: 45.60 cm/s MV A velocity: 81.20 cm/s  SHUNTS MV E/A ratio:  0.56        Systemic VTI:  0.21 m                            Systemic Diam: 2.40 cm Jodelle Red MD Electronically signed by Jodelle Red MD Signature Date/Time: 05/12/2023/5:59:52 PM    Final    DG Chest Portable 1 View  Result Date: 05/11/2023 CLINICAL DATA:  Cough. EXAM: PORTABLE CHEST 1 VIEW COMPARISON:  07/01/2018. FINDINGS: Bilateral lung fields are clear. No consolidation or lung collapse. No pulmonary edema. Bilateral costophrenic angles are clear. Normal cardio-mediastinal silhouette. No acute osseous abnormalities. The soft tissues are within normal limits. IMPRESSION: *No active disease. Electronically Signed   By: Jules Schick M.D.   On: 05/11/2023 13:47   CT Head Wo Contrast  Result Date: 05/11/2023 CLINICAL DATA:  Headache, new onset (Age >= 51y) EXAM: CT HEAD WITHOUT CONTRAST TECHNIQUE: Contiguous axial images were obtained from the base of the skull through the vertex without intravenous contrast. RADIATION DOSE REDUCTION: This exam was performed according to the departmental dose-optimization program which includes automated exposure control, adjustment of the mA and/or kV according to patient size and/or use of iterative reconstruction technique. COMPARISON:  None Available. FINDINGS: Brain: No evidence of acute infarction, hemorrhage, hydrocephalus, extra-axial collection or mass lesion/mass effect. Vascular: No hyperdense vessel or unexpected calcification. Skull: Normal. Negative for fracture or focal lesion. Sinuses/Orbits: No middle ear or mastoid effusion. Paranasal sinuses are notable for mucosal thickening of the floor of the right maxillary sinus. Orbits are unremarkable. Other: None. IMPRESSION:  No acute intracranial abnormality. Electronically Signed   By: Lorenza Cambridge M.D.   On: 05/11/2023 12:33    Labs:  CBC: Recent Labs    05/17/23 1232 05/17/23 1741 05/18/23 0452 05/22/23 0709  WBC 8.7 9.6 6.9 10.5  HGB 10.2* 9.8* 9.1* 9.4*  HCT 30.3* 28.9* 27.2* 28.9*  PLT 261 250 215 285    COAGS: No results for input(s): "INR", "APTT" in the last 8760 hours.  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 Imaging reviewed. Amenable to perc biopsy. Also recommend CT A/P for completion staging imaging. Labs reviewed, need to check INR. Plan for biopsy tomorrow NPO p MN Risks and benefits of liver bx was discussed with the patient and/or patient's family including, but not limited to bleeding, infection, damage to adjacent structures or low yield requiring additional tests.  All of the questions were answered and there is agreement to proceed.  Will have pt sign consent form prior to procedure tomorrow as he is still fresh off sedation from endoscopy.    Electronically Signed: Brayton El, PA-C 05/22/2023, 2:15 PM   I spent a total of 30 minutes in face to face in clinical consultation, greater than 50% of which was counseling/coordinating care for liver biopsy

## 2023-05-22 NOTE — Plan of Care (Signed)

## 2023-05-22 NOTE — Progress Notes (Signed)
Occupational Therapy Treatment Patient Details Name: Jordan Calderon MRN: [id number] DOB: 1961-06-08 Today's Date: 05/22/2023   History of present illness 62 yo male [facility] on 11/27 with lightheadedness, R ear pain, R ptosis, HA, cough, n/v. 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 Recent ED visit 11/21 for syncope workup. PMH includes HIV on Hart therapy, HLD, depression, anxiety, AKI.   OT comments  Pt is making steady progress towards their acute OT goals. Pt reports being fatigued from procedure today but agreeable to participate. He completed all transfers and mobility without physical assist, however he continues to benefit from BUE support on RW due to scissoring gait and LOBs. Practiced simulated tub transfers with large steps. Pt advised to have friend assist him in/out of shower initially for safety, he was agreeable. OT to continue to follow acutely to facilitate progress towards established goals. Pt will continue to benefit from [facility].       If plan is discharge home, recommend the following:  A little help with walking and/or transfers;A little help with bathing/dressing/bathroom;Assistance with cooking/housework;Direct supervision/assist for medications management;Direct supervision/assist for financial management;Assist for transportation;Help with stairs or ramp for entrance   Equipment Recommendations  Other (comment);Tub/shower seat       Precautions / Restrictions Precautions Precautions: Fall Precaution Comments: R ocular headaches vs frontal headaches with mobility, tends to close R eye given pain Restrictions Weight Bearing Restrictions: No       Mobility Bed Mobility Overal bed mobility: Needs Assistance Bed Mobility: Supine to Sit, Sit to Supine     Supine to sit: Supervision Sit to supine: Supervision        Transfers Overall transfer level: Needs assistance Equipment used:  Rolling walker (2 wheels), None Transfers: Sit to/from Stand Sit to Stand: Contact guard assist           General transfer comment: cues for hand placement     Balance Overall balance assessment: Needs assistance Sitting-balance support: Feet supported Sitting balance-Leahy Scale: Fair     Standing balance support: Single extremity supported, During functional activity Standing balance-Leahy Scale: Fair Standing balance comment: statically                           ADL either performed or assessed with clinical judgement   ADL Overall ADL's : Needs assistance/impaired   Functional mobility during ADLs: Contact guard assist;Rolling walker (2 wheels);Cueing for safety General ADL Comments: scissoring gait at times, mild LOBs and benefits from BUE support of RW.    Extremity/Trunk Assessment Upper Extremity Assessment Upper Extremity Assessment: RUE deficits/detail;LUE deficits/detail RUE Deficits / Details: WFL. mildly incoordinated LUE Deficits / Details: WFL. mildly incoordinated   Lower Extremity Assessment Lower Extremity Assessment: Defer to PT evaluation        Vision   Vision Assessment?: No apparent visual deficits   Perception Perception Perception: Not tested   Praxis Praxis Praxis: Not tested    Cognition Arousal: Alert Behavior During Therapy: WFL for tasks assessed/performed Overall Cognitive Status: Within Functional Limits for tasks assessed                                General Comments VSS    Pertinent Vitals/ Pain       Pain Assessment Pain Assessment: Faces Faces Pain Scale: Hurts little more Pain Location: R side head Pain Descriptors /  Indicators: Pounding, Headache Pain Intervention(s): Limited activity within patient's tolerance, Monitored during session         Frequency  Min 1X/week        Progress Toward Goals  OT Goals(current goals can now be found in the care plan section)  Progress  towards OT goals: Progressing toward goals  Acute Rehab OT Goals Patient Stated Goal: to rest OT Goal Formulation: With patient Time For Goal Achievement: 06/02/23 Potential to Achieve Goals: Good ADL Goals Pt Will Perform Grooming: with modified independence;standing Pt Will Perform Lower Body Dressing: with modified independence;sit to/from stand Pt Will Transfer to Toilet: with modified independence;regular height toilet Additional ADL Goal #1: Pt will indep complete IADL medicaiton management task   AM-PAC OT "6 Clicks" Daily Activity     Outcome Measure   Help from another person eating meals?: None Help from another person taking care of personal grooming?: A Little Help from another person toileting, which includes using toliet, bedpan, or urinal?: A Little Help from another person bathing (including washing, rinsing, drying)?: A Little Help from another person to put on and taking off regular upper body clothing?: A Little Help from another person to put on and taking off regular lower body clothing?: A Little 6 Click Score: 19    End of Session Equipment Utilized During Treatment: Gait belt;Rolling walker (2 wheels)  OT Visit Diagnosis: Unsteadiness on feet (R26.81);Other abnormalities of gait and mobility (R26.89);Muscle weakness (generalized) (M62.81);Pain   Activity Tolerance Patient tolerated treatment well   Patient Left in bed;with bed alarm set;with call bell/phone within reach   Nurse Communication Mobility status        Time: 0454-0981 OT Time Calculation (min): 17 min  Charges: OT General Charges $OT Visit: 1 Visit OT Treatments $Therapeutic Activity: 8-22 mins  Derenda Mis, OTR/L Acute Rehabilitation Services Office [phone] Secure Chat Communication Preferred   Jordan Calderon 05/22/2023, 3:28 PM

## 2023-05-22 NOTE — Progress Notes (Signed)
8295 Pt has left floor with transport for EGD.   1136 Pt has returned to floor from EGD. NAD.

## 2023-05-22 NOTE — Progress Notes (Signed)
SLP Cancellation Note  Patient Details Name: ADELINO PETREE MRN: [id number] DOB: 10/06/1960   Cancelled treatment:       Reason Eval/Treat Not Completed: Patient at procedure or test/unavailable   Nicola Heinemann, Riley Nearing 05/22/2023, 9:32 AM

## 2023-05-22 NOTE — Progress Notes (Addendum)
The patient is requesting for something to sleep. Notified Dr. Arlean Hopping and received an order for Trazodone 50 mg  oral PRN for sleep.

## 2023-05-22 NOTE — TOC Progression Note (Signed)
Transition of Care [facility]) - Progression Note    Patient Details  Name: Jordan Calderon MRN: [id number] Date of Birth: 04/13/1961  [facility] CM/SW Contact  Kermit Balo, RN Phone Number: 05/22/2023, 3:31 PM  Clinical Narrative:     Plan continues to be for home with [facility] services when medically ready. TOC following.   Expected Discharge Plan: Home w Home Health Services Barriers to Discharge: Continued Medical Work up  Expected Discharge Plan and Services   Discharge Planning Services: CM Consult Post Acute Care Choice: Home Health, Durable Medical Equipment Living arrangements for the past 2 months: Single Family Home                 DME Arranged: Walker rolling DME Agency: [employer] Date DME Agency Contacted: 05/19/23   Representative spoke with at DME Agency: [facility]ack HH Arranged: PT, OT HH Agency: [facility] Date HH Agency Contacted: 05/19/23   Representative spoke with at [facility] Agency: Adele Dan   Social Determinants of Health (SDOH) Interventions SDOH Screenings   Food Insecurity: No Food Insecurity (05/17/2023)  Housing: Low Risk  (05/17/2023)  Recent Concern: Housing - High Risk (05/12/2023)  Transportation Needs: No Transportation Needs (05/17/2023)  Utilities: Not At Risk (05/17/2023)  Depression (PHQ2-9): Low Risk  (04/19/2023)  Tobacco Use: Medium Risk (05/22/2023)    Readmission Risk Interventions     No data to display

## 2023-05-22 NOTE — Progress Notes (Signed)
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  EBV DNA results pending. -Seen by ENT. Egd done today 05/22/23. Bx results pending. -Plan u/s guided bx of liver. Scheduled for tomorrow 12/3 per nurse.  -Will need PET CT as outpatient -Upon discharge patient to follow-up with Dr. Arlana Pouch at [facility]  2. Anemia, normocytic -likely due to GI bleed previous as pt admitted to bloody stool a few days ago. -Egd done today, showed esophagitis, non-bleeding gastric ulcer.  -hgb stable 9.4; MCV stable today.  -No transfusional support indicated at this time.   3. HIV+ -on HART therapy, continue protocol -continue follow up with ID  Code Status Full   Subjective:  Pt seen awake and alert. Sitting in chair at bedside. Reports that he feels tired. Denies acute pain or other acute complaints.   Objective:  Vitals:   05/22/23 1110 05/22/23 1120  BP: 108/75 (!) 134/99  Pulse: (!) 58 61  Resp: 11 17  Temp:    SpO2: 96% 98%     Intake/Output Summary (Last 24 hours) at 05/22/2023 1434 Last data filed at 05/22/2023 1053 Gross per 24 hour  Intake 300 ml  Output 1000 ml  Net -700 ml     REVIEW OF SYSTEMS:   Constitutional: Denies fevers, chills or abnormal night sweats, +fatigue Eyes: Denies blurriness of vision, double vision or watery eyes Ears, nose, mouth, throat, and face: Denies mucositis or sore throat Respiratory: Denies cough, dyspnea or wheezes Cardiovascular: Denies palpitation, chest discomfort or lower extremity swelling Gastrointestinal:  Denies nausea, heartburn or change in bowel habits Skin: Denies abnormal skin rashes Lymphatics: Denies new lymphadenopathy or easy  bruising Neurological:Denies numbness, tingling or new weaknesses Behavioral/Psych: Mood is stable, no new changes  All other systems were reviewed with the patient and are negative.  PHYSICAL EXAMINATION: ECOG PERFORMANCE STATUS: 1 - Symptomatic but completely ambulatory  Vitals:   05/22/23 1110 05/22/23 1120  BP: 108/75 (!) 134/99  Pulse: (!) 58 61  Resp: 11 17  Temp:    SpO2: 96% 98%   Filed Weights   05/17/23 1157 05/17/23 1736 05/19/23 1031  Weight: 164 lb 7.4 oz (74.6 kg) 159 lb 13.3 oz (72.5 kg) 160 lb (72.6 kg)    GENERAL:alert, no distress and comfortable SKIN: skin color, texture, turgor are normal, no rashes or significant lesions EYES: normal, conjunctiva are pink and non-injected, sclera clear OROPHARYNX:no exudate, no erythema and lips, buccal mucosa, and tongue normal  NECK: supple, thyroid normal size, non-tender, without nodularity LYMPH:  no palpable lymphadenopathy in the cervical, axillary or inguinal LUNGS: clear to auscultation and percussion with normal breathing effort HEART: regular rate & rhythm and no murmurs and no lower extremity edema ABDOMEN:abdomen soft, non-tender and normal bowel sounds Musculoskeletal:no cyanosis of digits and no clubbing  PSYCH: alert & oriented x 3 with fluent speech NEURO: no focal motor/sensory deficits   All questions were answered. The patient knows to call the clinic with any problems, questions or concerns.   The total time spent in the appointment was 25 minutes encounter with patient including review of chart and various tests results, discussions about plan of care and coordination of care plan  Dietrich Pates Jariah Jarmon,  NP 05/22/2023 2:34 PM    Labs Reviewed:  Recent Labs    01/17/23 1308 05/11/23 1135 05/12/23 6578 05/17/23 1232 05/17/23 1741 05/18/23 0452 05/22/23 0709  NA 137 132*   < > 135  --  132* 136  K 4.3 3.7   < > 4.3  --  4.1 4.7  CL 105 102   < > 99  --  104 104  CO2 26 20*   < > 25  --  22 25   GLUCOSE 83 155*   < > 110*  --  103* 104*  BUN 31* 27*   < > 20  --  17 26*  CREATININE 1.12 1.58*   < > 1.17 1.15 1.08 1.11  CALCIUM 9.1 9.1   < > 9.9  --  8.4* 8.8*  GFRNONAA  --  49*   < > >60 >60 >60 >60  PROT 7.8 8.2*  --   --   --  7.0  --   ALBUMIN  --  4.1  --   --   --  3.1*  --   AST 22 35  --   --   --  24  --   ALT 16 33  --   --   --  26  --   ALKPHOS  --  111  --   --   --  87  --   BILITOT 0.9 1.2*  --   --   --  0.5  --    < > = values in this interval not displayed.   CBC    Component Value Date/Time   WBC 10.5 05/22/2023 0709   RBC 2.96 (L) 05/22/2023 0709   HGB 9.4 (L) 05/22/2023 0709   HCT 28.9 (L) 05/22/2023 0709   HCT 39 08/01/2018 0000   PLT 285 05/22/2023 0709   MCV 97.6 05/22/2023 0709   MCH 31.8 05/22/2023 0709   MCHC 32.5 05/22/2023 0709   RDW 13.1 05/22/2023 0709   LYMPHSABS 3.7 05/11/2023 1135   MONOABS 1.3 (H) 05/11/2023 1135   EOSABS 0.4 05/11/2023 1135   BASOSABS 0.1 05/11/2023 1135     Studies Reviewed:  CT CHEST W CONTRAST  Result Date: 05/20/2023 CLINICAL DATA:  Staging colon cancer. New nasopharyngeal mass. * Tracking Code: BO * EXAM: CT CHEST WITH CONTRAST TECHNIQUE: Multidetector CT imaging of the chest was performed during intravenous contrast administration. RADIATION DOSE REDUCTION: This exam was performed according to the departmental dose-optimization program which includes automated exposure control, adjustment of the mA and/or kV according to patient size and/or use of iterative reconstruction technique. CONTRAST:  50mL OMNIPAQUE IOHEXOL 350 MG/ML SOLN COMPARISON:  None Available. FINDINGS: Cardiovascular: Heart is nonenlarged. Coronary artery calcifications are seen. Please correlate for other coronary risk factors. Diameter of the ascending aorta measures 4.4 by 4.4 cm at the level of the right pulmonary artery. More normal caliber arch and descending thoracic aorta. Mild vascular calcifications along the aorta. Mediastinum/Nodes:  Preserved thyroid gland. Slightly patulous thoracic esophagus with some luminal air and fluid. No specific abnormal lymph node enlargement present in the axillary region, hilum or mediastinum. A few small less than 1 cm size mediastinal and right hilar nodes are seen, nonpathologic by size criteria. Lungs/Pleura: No consolidation, pneumothorax or effusion. Slight breathing motion. Left lower lobe lung nodule identified measuring 8 by 3 mm on series 4, image 136. Similar focus in the right lung base measuring 7 mm on series 4 image 123. Few other  tiny nodules in the right lung such as series 4, image 119 in the lower lobe. Few patchy areas of ground-glass type areas are seen as well. Apical pleural thickening identified with some paraseptal and centrilobular emphysematous changes. Some areas of interstitial septal thickening as well. Upper Abdomen: Multiple liver masses are identified. At least 15 lesions are seen. Example in segment 2 on series 3, image 147 measures 2.7 by 2.6 cm. Middle lobe lesion on series 3, image 174 measures 2.7 by 2.7 cm. Adrenal glands are preserved. There also abnormal nodes identified such as lesser curve of the stomach measuring 16 x 20 mm. Musculoskeletal: Mild degenerative changes along the spine. IMPRESSION: Numerous liver metastases. Upper abdominal abnormal nodes. Further workup of the abdomen and pelvis may be useful when clinically appropriate if there is no known previous. Patulous esophagus with slight wall thickening. Please correlate with symptoms and additional evaluation when clinically appropriate. Small mediastinal lymph nodes.  Attention on follow-up. Small lung nodules measuring up to 8 mm. Recommend follow up in 3 months or as per the patient's primary neoplasm. Coronary artery calcifications. In addition dilatation of the ascending aorta. Recommend annual imaging followup by CTA or MRA. This recommendation follows 2010 ACCF/AHA/AATS/ACR/ASA/SCA/SCAI/SIR/STS/SVM  Guidelines for the Diagnosis and Management of Patients with Thoracic Aortic Disease. Circulation. 2010; 121: D220-U542. Aortic aneurysm NOS (ICD10-I71.9) Aortic Atherosclerosis (ICD10-I70.0) and Emphysema (ICD10-J43.9). Electronically Signed   By: Karen Kays M.D.   On: 05/20/2023 14:09   CT SOFT TISSUE NECK W CONTRAST  Result Date: 05/18/2023 CLINICAL DATA:  Neck mass EXAM: CT NECK WITH CONTRAST TECHNIQUE: Multidetector CT imaging of the neck was performed using the standard protocol following the bolus administration of intravenous contrast. RADIATION DOSE REDUCTION: This exam was performed according to the departmental dose-optimization program which includes automated exposure control, adjustment of the mA and/or kV according to patient size and/or use of iterative reconstruction technique. CONTRAST:  75mL OMNIPAQUE IOHEXOL 350 MG/ML SOLN COMPARISON:  CTA head/neck 05/17/23 FINDINGS: Pharynx and larynx: There is a large right nasopharyngeal mass that extends inferiorly to the level the greater horn of the hyoid bone on right. This mass involves the right-sided adenoid and palatine tonsils, the right parapharyngeal space, the right masticator space, the right parotid space. Superiorly this mass likely extends to the level of the foramen ovale bilaterally. Salivary glands: The lateral extension of the large nasopharyngeal mass abuts the right parotid gland in the superior aspect of the right submandibular gland. There is a 9 mm hypodense lesion in the left parotid gland. Thyroid: Normal. Lymph nodes: There is a 9 mm hypodense lesion in the left parotid gland (series 2, image 46). It is unclear if this represents a necrotic lymph node or an intraparotid cyst. Vascular: Negative. Limited intracranial: Negative. Visualized orbits: Negative. Mastoids and visualized paranasal sinuses: No middle ear or mastoid effusion. Paranasal sinuses are clear. Skeleton: No acute or aggressive process. Upper chest: Negative.  Other: None IMPRESSION: 1. Large right nasopharyngeal mass that extends inferiorly to the level the greater horn of the hyoid bone on the right. This mass involves the right-sided adenoid and palatine tonsils, the right parapharyngeal space, the right masticator space, the right parotid space. Superiorly this mass likely extends to the level of the foramen ovale bilaterally. 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 Recommend ENT consultation. 2. A 9 mm hypodense lesion in the left parotid gland. It is unclear if this represents a necrotic lymph node or an intraparotid cyst. Electronically Signed  By: Lorenza Cambridge M.D.   On: 05/18/2023 11:31   MR ANGIO HEAD WO CONTRAST  Result Date: 05/17/2023 CLINICAL DATA:  Initial evaluation for persistent dizziness. EXAM: MRA HEAD WITHOUT CONTRAST TECHNIQUE: Angiographic images of the Circle of Willis were acquired using MRA technique without intravenous contrast. COMPARISON:  CTA from earlier the same day. FINDINGS: Anterior circulation: Both internal carotid arteries are widely patent through the siphons without stenosis or other abnormality. Again seen is a 3 mm outpouching extending posteriorly and laterally from the left ICA terminus (series 2, image 93). Possible small vessel emanating from its apex, not entirely certain. Finding could reflect an vascular infundibulum versus aneurysm. A1 segments patent bilaterally, with the right dominant. Normal anterior communicating artery complex. Anterior cerebral arteries patent without stenosis. No M1 stenosis or occlusion. Distal MCA branches perfused and symmetric. Posterior circulation: Visualized V4 segments patent without stenosis. Right PICA patent. Left PICA origin not seen. Basilar patent without stenosis. Superior cerebellar and posterior cerebral arteries widely patent bilaterally. Anatomic variants: As above. Other: Previously identified mass at the right skull base noted, partially  visualized, better evaluated on prior CT. IMPRESSION: 1. 3 mm outpouching extending posteriorly and laterally from the left ICA terminus, potentially reflecting an vascular infundibulum versus aneurysm. Attention at follow-up recommended. 2. Otherwise normal intracranial MRA. No large vessel occlusion or other emergent finding. Electronically Signed   By: Rise Mu M.D.   On: 05/17/2023 20:58   CT ANGIO HEAD NECK W WO CM  Result Date: 05/17/2023 CLINICAL DATA:  Cerebral vasospasm suspected EXAM: CT ANGIOGRAPHY HEAD AND NECK WITH AND WITHOUT CONTRAST TECHNIQUE: Multidetector CT imaging of the head and neck was performed using the standard protocol during bolus administration of intravenous contrast. Multiplanar CT image reconstructions and MIPs were obtained to evaluate the vascular anatomy. Carotid stenosis measurements (when applicable) are obtained utilizing NASCET criteria, using the distal internal carotid diameter as the denominator. RADIATION DOSE REDUCTION: This exam was performed according to the departmental dose-optimization program which includes automated exposure control, adjustment of the mA and/or kV according to patient size and/or use of iterative reconstruction technique. CONTRAST:  75mL OMNIPAQUE IOHEXOL 350 MG/ML SOLN COMPARISON:  None Available. FINDINGS: CT HEAD FINDINGS Brain: No evidence of acute infarction, hemorrhage, hydrocephalus, extra-axial collection or intraparenchymal mass lesion/mass effect. Vascular: See below. Skull: No acute fracture. Sinuses/Orbits: Clear sinuses.  No acute orbital findings. Other: No mastoid effusions. Review of the MIP images confirms the above findings CTA NECK FINDINGS Aortic arch: Great vessel origins are patent without significant stenosis. Right carotid system: No evidence of dissection, stenosis (50% or greater), or occlusion. Left carotid system: No evidence of dissection, stenosis (50% or greater), or occlusion. Mild atherosclerosis at  the carotid bifurcation. Vertebral arteries: Codominant. No evidence of dissection, stenosis (50% or greater), or occlusion. Skeleton: No acute abnormality on limited assessment. 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 Review of the MIP images confirms the above findings CTA HEAD FINDINGS Anterior circulation: Bilateral intracranial ICAs, MCAs, and ACAs are patent without proximal hemodynamically significant stenosis. Hypoplastic left A1 ACA, likely congenital. Approximately 2 mm posteriorly inferiorly directed outpouching arising from the left carotid terminus (for example see series 605, image 111 and series 604, image 154 Posterior circulation: Tortuous vertebral arteries. The vertebral arteries, basilar artery and bilateral posterior cerebral arteries are patent without proximal hemodynamically significant stenosis. Venous sinuses: As permitted by contrast timing, patent. Anatomic variants: Described above. Review of the  MIP images confirms the above findings IMPRESSION: 1. 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 Recommend ENT consultation, correlation with direct inspection, and follow-up dedicated CT of the neck with contrast for better characterization. 2. No emergent large vessel occlusion or proximal hemodynamically significant stenosis. 3. Approximately 2 mm posteriorly inferiorly directed outpouching arising from the left carotid terminus which could represent an infundibulum with vessel not well seen versus aneurysm. 4. Aortic Atherosclerosis (ICD10-I70.0) and Emphysema (ICD10-J43.9). Electronically Signed   By: Feliberto Harts M.D.   On: 05/17/2023 14:55   DG Chest Port 1 View  Result Date: 05/17/2023 CLINICAL DATA:  Worsening cough and dry heaving with shortness of breath EXAM: PORTABLE CHEST 1 VIEW  COMPARISON:  Chest radiograph dated 05/11/2023 FINDINGS: Normal lung volumes. No focal consolidations. No pleural effusion or pneumothorax. The heart size and mediastinal contours are within normal limits. No acute osseous abnormality. IMPRESSION: No active disease. Electronically Signed   By: Agustin Cree M.D.   On: 05/17/2023 14:04   ECHOCARDIOGRAM COMPLETE  Result Date: 05/12/2023    ECHOCARDIOGRAM REPORT   Patient Name:   ANKUR RABBITT Roesler Date of Exam: 05/12/2023 Medical Rec #:  [id number]       Height:       69.0 in Accession #:    [id number]      Weight:       154.6 lb Date of Birth:  Sep 19, 1960       BSA:          1.852 m Patient Age:    62 years        BP:           124/67 mmHg Patient Gender: M               HR:           73 bpm. Exam Location:  Inpatient Procedure: 2D Echo, Color Doppler and Cardiac Doppler Indications:    Syncope  History:        Patient has no prior history of Echocardiogram examinations.                 Signs/Symptoms:Syncope; Risk Factors:HIV, Polysubstance Abuse                 and Former Smoker.  Sonographer:    Milbert Coulter Referring Phys: [id number] JARED M GARDNER IMPRESSIONS  1. Left ventricular ejection fraction, by estimation, is 60 to 65%. The left ventricle has normal function. The left ventricle has no regional wall motion abnormalities. Left ventricular diastolic parameters were normal.  2. Right ventricular systolic function is normal. The right ventricular size is normal. There is normal pulmonary artery systolic pressure.  3. The mitral valve is normal in structure. No evidence of mitral valve regurgitation. No evidence of mitral stenosis.  4. The aortic valve is tricuspid. There is mild calcification of the aortic valve. There is mild thickening of the aortic valve. Aortic valve regurgitation is not visualized. Aortic valve sclerosis/calcification is present, without any evidence of aortic stenosis.  5. Aortic dilatation noted. There is mild dilatation of the ascending aorta,  measuring 40 mm.  6. The inferior vena cava is normal in size with greater than 50% respiratory variability, suggesting right atrial pressure of 3 mmHg. Comparison(s): No prior Echocardiogram. Conclusion(s)/Recommendation(s): Otherwise normal echocardiogram, with minor abnormalities described in the report. Mild dilation of ascending aorta. FINDINGS  Left Ventricle: Left ventricular ejection fraction, by estimation, is  60 to 65%. The left ventricle has normal function. The left ventricle has no regional wall motion abnormalities. The left ventricular internal cavity size was normal in size. There is  no left ventricular hypertrophy. Left ventricular diastolic parameters were normal. Right Ventricle: The right ventricular size is normal. No increase in right ventricular wall thickness. Right ventricular systolic function is normal. There is normal pulmonary artery systolic pressure. The tricuspid regurgitant velocity is 2.54 m/s, and  with an assumed right atrial pressure of 3 mmHg, the estimated right ventricular systolic pressure is 28.8 mmHg. Left Atrium: Left atrial size was normal in size. Right Atrium: Right atrial size was normal in size. Prominent Chiari network. Pericardium: There is no evidence of pericardial effusion. Mitral Valve: The mitral valve is normal in structure. No evidence of mitral valve regurgitation. No evidence of mitral valve stenosis. Tricuspid Valve: The tricuspid valve is normal in structure. Tricuspid valve regurgitation is mild . No evidence of tricuspid stenosis. Aortic Valve: The aortic valve is tricuspid. There is mild calcification of the aortic valve. There is mild thickening of the aortic valve. Aortic valve regurgitation is not visualized. Aortic valve sclerosis/calcification is present, without any evidence of aortic stenosis. Aortic valve mean gradient measures 4.0 mmHg. Aortic valve peak gradient measures 8.3 mmHg. Aortic valve area, by VTI measures 4.09 cm. Pulmonic Valve:  The pulmonic valve was grossly normal. Pulmonic valve regurgitation is trivial. No evidence of pulmonic stenosis. Aorta: Aortic dilatation noted. There is mild dilatation of the ascending aorta, measuring 40 mm. Venous: The inferior vena cava is normal in size with greater than 50% respiratory variability, suggesting right atrial pressure of 3 mmHg. IAS/Shunts: No atrial level shunt detected by color flow Doppler.  LEFT VENTRICLE PLAX 2D LVIDd:         4.60 cm   Diastology LVIDs:         3.10 cm   LV e' medial:    7.18 cm/s LV PW:         1.10 cm   LV E/e' medial:  6.4 LV IVS:        1.20 cm   LV e' lateral:   16.00 cm/s LVOT diam:     2.40 cm   LV E/e' lateral: 2.8 LV SV:         94 LV SV Index:   51 LVOT Area:     4.52 cm  RIGHT VENTRICLE RV Basal diam:  3.00 cm RV Mid diam:    2.80 cm RV S prime:     13.60 cm/s TAPSE (M-mode): 2.0 cm LEFT ATRIUM             Index        RIGHT ATRIUM           Index LA diam:        3.80 cm 2.05 cm/m   RA Area:     13.10 cm LA Vol (A2C):   34.3 ml 18.52 ml/m  RA Volume:   29.50 ml  15.93 ml/m LA Vol (A4C):   45.1 ml 24.35 ml/m LA Biplane Vol: 40.7 ml 21.98 ml/m  AORTIC VALVE AV Area (Vmax):    3.96 cm AV Area (Vmean):   3.80 cm AV Area (VTI):     4.09 cm AV Vmax:           144.00 cm/s AV Vmean:          94.700 cm/s AV VTI:  0.230 m AV Peak Grad:      8.3 mmHg AV Mean Grad:      4.0 mmHg LVOT Vmax:         126.00 cm/s LVOT Vmean:        79.600 cm/s LVOT VTI:          0.208 m LVOT/AV VTI ratio: 0.90  AORTA Ao Root diam: 4.00 cm Ao Asc diam:  4.00 cm MITRAL VALVE               TRICUSPID VALVE MV Area (PHT): 2.33 cm    TR Peak grad:   25.8 mmHg MV Decel Time: 325 msec    TR Vmax:        254.00 cm/s MV E velocity: 45.60 cm/s MV A velocity: 81.20 cm/s  SHUNTS MV E/A ratio:  0.56        Systemic VTI:  0.21 m                            Systemic Diam: 2.40 cm Jodelle Red MD Electronically signed by Jodelle Red MD Signature Date/Time:  05/12/2023/5:59:52 PM    Final    DG Chest Portable 1 View  Result Date: 05/11/2023 CLINICAL DATA:  Cough. EXAM: PORTABLE CHEST 1 VIEW COMPARISON:  07/01/2018. FINDINGS: Bilateral lung fields are clear. No consolidation or lung collapse. No pulmonary edema. Bilateral costophrenic angles are clear. Normal cardio-mediastinal silhouette. No acute osseous abnormalities. The soft tissues are within normal limits. IMPRESSION: *No active disease. Electronically Signed   By: Jules Schick M.D.   On: 05/11/2023 13:47   CT Head Wo Contrast  Result Date: 05/11/2023 CLINICAL DATA:  Headache, new onset (Age >= 51y) EXAM: CT HEAD WITHOUT CONTRAST TECHNIQUE: Contiguous axial images were obtained from the base of the skull through the vertex without intravenous contrast. RADIATION DOSE REDUCTION: This exam was performed according to the departmental dose-optimization program which includes automated exposure control, adjustment of the mA and/or kV according to patient size and/or use of iterative reconstruction technique. COMPARISON:  None Available. FINDINGS: Brain: No evidence of acute infarction, hemorrhage, hydrocephalus, extra-axial collection or mass lesion/mass effect. Vascular: No hyperdense vessel or unexpected calcification. Skull: Normal. Negative for fracture or focal lesion. Sinuses/Orbits: No middle ear or mastoid effusion. Paranasal sinuses are notable for mucosal thickening of the floor of the right maxillary sinus. Orbits are unremarkable. Other: None. IMPRESSION: No acute intracranial abnormality. Electronically Signed   By: Lorenza Cambridge M.D.   On: 05/11/2023 12:33

## 2023-05-22 NOTE — Anesthesia Preprocedure Evaluation (Addendum)
Anesthesia Evaluation  Patient identified by MRN, date of birth, ID band Patient awake    Reviewed: Allergy & Precautions, NPO status , Patient's Chart, lab work & pertinent test results  Airway Mallampati: II  TM Distance: >3 FB Neck ROM: Full    Dental no notable dental hx.    Pulmonary former smoker   Pulmonary exam normal        Cardiovascular negative cardio ROS  Rhythm:Regular Rate:Normal     Neuro/Psych  Headaches  Anxiety Depression       GI/Hepatic   Endo/Other  Hypothyroidism    Renal/GU      Musculoskeletal   Abdominal Normal abdominal exam  (+)   Peds  Hematology  (+) Blood dyscrasia, anemia , HIVLab Results      Component                Value               Date                      WBC                      10.5                05/22/2023                HGB                      9.4 (L)             05/22/2023                HCT                      28.9 (L)            05/22/2023                MCV                      97.6                05/22/2023                PLT                      285                 05/22/2023              Anesthesia Other Findings   Reproductive/Obstetrics                              Anesthesia Physical Anesthesia Plan  ASA: 3  Anesthesia Plan: MAC   Post-op Pain Management:    Induction:   PONV Risk Score and Plan: 1 and Propofol infusion and Treatment may vary due to age or medical condition  Airway Management Planned: Simple Face Mask and Nasal Cannula  Additional Equipment: None  Intra-op Plan:   Post-operative Plan:   Informed Consent: I have reviewed the patients History and Physical, chart, labs and discussed the procedure including the risks, benefits and alternatives for the proposed anesthesia with the patient or authorized representative who has indicated his/her understanding and acceptance.     Dental advisory  given  Plan  Discussed with: CRNA  Anesthesia Plan Comments:         Anesthesia Quick Evaluation

## 2023-05-22 NOTE — Progress Notes (Addendum)
[facility] PROGRESS NOTE    Progress Note  Jordan Calderon  UYQ:[id number] DOB: 1961/03/09 DOA: 05/17/2023 PCP: Pcp, No     Brief Narrative:   Jordan Calderon is an 62 y.o. male past medical history significant for HIV on [facility] therapy with a last CD4 count of 132 recent admission for syncopal episode and acute kidney injury during this admission he had a TEE that showed an EF of 60% no regional wall motion abnormality, was aggressively hydrated, CT of the head showed no acute findings.  Relates that since discharge he has been feeling weak having more headaches.  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   ENT was consulted Concern about carcinoma.  Status post biopsy on 05/19/2023. Oncology consulted recommended CT of the chest with contrast, EBV DNA. PET scan and radiation oncology consult as an outpatient. CT showing liver mets ENT nasal biopsy showing no signs malignancy Oncology is aware, has ordered IR biopsy of liver mass  Normocytic anemia: Melena Hiatal hernia Esophagitis Gastric ulcer Candida esophagitis Melanotic stool a few days ago, hgb here in the 9s from normal previously, egd today with esophagitis, non-bleeding gastric ulcer, hiatal hernia, candida. - GI advises monitoring overnight to ensure stable hgb and no ongoing bleeding - holding all nsaids - bid PPI - fluconazole  HIV disease: Continue HAART therapy, continue dapsone.  Hypothyroidism: Continue Synthroid.  DVT prophylaxis: lovenox Family Communication:none Status is: Inpatient Remains inpatient appropriate because: monitoring for bleeding, IR biopsy of liver mass     Code Status:     Code Status Orders  (From admission, onward)           Start      Ordered   05/17/23 1905  Full code  Continuous       Question:  By:  Answer:  Consent: discussion documented in EHR   05/17/23 1906           Code Status History     Date Active Date Inactive Code Status Order ID Comments User Context   05/17/2023 1532 05/17/2023 1906 Full Code [id number]  Nolberto Hanlon, MD ED   05/11/2023 2024 05/12/2023 2308 Full Code [id number]  Hillary Bow, DO Inpatient   07/01/2018 1624 07/03/2018 1537 Full Code [id number]  Dorcas Carrow, MD Inpatient   02/10/2014 2054 02/12/2014 0031 Full Code [id number]  Christen Bame, MD Inpatient      Advance Directive Documentation    Flowsheet Row Most Recent Value  Type of Advance Directive Healthcare Power of Attorney, Living will  Pre-existing out of facility DNR order (yellow form or pink MOST form) --  "MOST" Form in Place? --         IV Access:   Peripheral IV   Procedures and diagnostic studies:   No results found.   Medical Consultants:   None.   Subjective:    Ellis Parents says pain controlled, tolerating diet, no dizziness with standing, hasn't had a bm in the last 24 hours  Objective:    Vitals:   05/22/23 0920 05/22/23 1059 05/22/23 1110 05/22/23 1120  BP: (!) 141/84 98/69 108/75 (!) 134/99  Pulse: (!) 56 68 (!) 58 61  Resp: 12 14 11 17   Temp: 98 F (36.7 C)  TempSrc: Temporal     SpO2: 92% 94% 96% 98%  Weight:      Height:       SpO2: 98 % O2 Flow Rate (L/min): 5 L/min   Intake/Output Summary (Last 24 hours) at 05/22/2023 1324 Last data filed at 05/22/2023 1053 Gross per 24 hour  Intake 300 ml  Output 1000 ml  Net -700 ml   Filed Weights   05/17/23 1157 05/17/23 1736 05/19/23 1031  Weight: 74.6 kg 72.5 kg 72.6 kg    Exam: General exam: In no acute distress. Respiratory system: Good air movement and clear to auscultation. Cardiovascular system: S1 & S2 heard, RRR. No JVD. Gastrointestinal system: Abdomen is nondistended, soft and nontender.  Extremities:  No pedal edema. Skin: No rashes, lesions or ulcers Psychiatry: Judgement and insight appear normal. Mood & affect appropriate. Data Reviewed:    Labs: Basic Metabolic Panel: Recent Labs  Lab 05/17/23 1232 05/17/23 1741 05/18/23 0452 05/22/23 0709  NA 135  --  132* 136  K 4.3  --  4.1 4.7  CL 99  --  104 104  CO2 25  --  22 25  GLUCOSE 110*  --  103* 104*  BUN 20  --  17 26*  CREATININE 1.17 1.15 1.08 1.11  CALCIUM 9.9  --  8.4* 8.8*   GFR Estimated Creatinine Clearance: 68.1 mL/min (by C-G formula based on SCr of 1.11 mg/dL). Liver Function Tests: Recent Labs  Lab 05/18/23 0452  AST 24  ALT 26  ALKPHOS 87  BILITOT 0.5  PROT 7.0  ALBUMIN 3.1*   No results for input(s): "LIPASE", "AMYLASE" in the last 168 hours. No results for input(s): "AMMONIA" in the last 168 hours. Coagulation profile No results for input(s): "INR", "PROTIME" in the last 168 hours. COVID-19 Labs  No results for input(s): "DDIMER", "FERRITIN", "LDH", "CRP" in the last 72 hours.  Lab Results  Component Value Date   SARSCOV2NAA NEGATIVE 05/11/2023    CBC: Recent Labs  Lab 05/17/23 1232 05/17/23 1741 05/18/23 0452 05/22/23 0709  WBC 8.7 9.6 6.9 10.5  HGB 10.2* 9.8* 9.1* 9.4*  HCT 30.3* 28.9* 27.2* 28.9*  MCV 96.2 95.1 96.5 97.6  PLT 261 250 215 285   Cardiac Enzymes: No results for input(s): "CKTOTAL", "CKMB", "CKMBINDEX", "TROPONINI" in the last 168 hours. BNP (last 3 results) No results for input(s): "PROBNP" in the last 8760 hours. CBG: No results for input(s): "GLUCAP" in the last 168 hours.  D-Dimer: No results for input(s): "DDIMER" in the last 72 hours. Hgb A1c: No results for input(s): "HGBA1C" in the last 72 hours.  Lipid Profile: No results for input(s): "CHOL", "HDL", "LDLCALC", "TRIG", "CHOLHDL", "LDLDIRECT" in the last 72 hours.  Thyroid function studies: No results for input(s): "TSH", "T4TOTAL", "T3FREE", "THYROIDAB" in the last 72 hours.  Invalid input(s):  "FREET3" Anemia work up: No results for input(s): "VITAMINB12", "FOLATE", "FERRITIN", "TIBC", "IRON", "RETICCTPCT" in the last 72 hours. Sepsis Labs: Recent Labs  Lab 05/17/23 1232 05/17/23 1741 05/18/23 0452 05/22/23 0709  WBC 8.7 9.6 6.9 10.5   Microbiology Recent Results (from the past 240 hour(s))  Blood culture (routine x 2)     Status: None   Collection Time: 05/17/23 12:13 PM   Specimen: BLOOD  Result Value Ref Range Status   Specimen Description   Final    BLOOD LEFT ANTECUBITAL Performed at [facility], [address], [place], [place] [address]    Special Requests   Final  BOTTLES DRAWN AEROBIC AND ANAEROBIC Blood Culture adequate volume Performed at [facility], [address] [place], [place] [address]    Culture   Final    NO GROWTH 5 DAYS Performed at [facility], [address], [place], [place] [address]    Report Status 05/22/2023 FINAL  Final  Blood culture (routine x 2)     Status: None   Collection Time: 05/17/23 12:18 PM   Specimen: BLOOD RIGHT HAND  Result Value Ref Range Status   Specimen Description   Final    BLOOD RIGHT HAND Performed at [facility], [address], [place], [place] [address]    Special Requests   Final    BOTTLES DRAWN AEROBIC AND ANAEROBIC Blood Culture adequate volume Performed at [facility], [address] [place], [place] [address]    Culture   Final    NO GROWTH 5 DAYS Performed at [facility], [address] [place], [place] [address]    Report Status 05/22/2023 FINAL  Final  Surgical pcr screen     Status: None   Collection Time: 05/18/23  8:38 PM   Specimen: Nasal Mucosa; Nasal Swab  Result Value Ref Range Status   MRSA, PCR NEGATIVE NEGATIVE Final   Staphylococcus aureus NEGATIVE NEGATIVE Final    Comment: (NOTE) The Xpert SA Assay (FDA approved for NASAL specimens in patients 65 years of age and older), is one component of a  comprehensive surveillance program. It is not intended to diagnose infection nor to guide or monitor treatment. Performed at [facility], [address] [place], [place] [address]      Medications:    calcium carbonate  1 tablet Oral TID WC   dapsone  100 mg Oral Q breakfast   Darunavir-Cobicistat-Emtricitabine-Tenofovir Alafenamide  1 tablet Oral Q breakfast   pantoprazole (PROTONIX) IV  40 mg Intravenous Q12H   Continuous Infusions:      LOS: 5 days   Silvano Bilis  [facility]  05/22/2023, 1:24 PM

## 2023-05-22 NOTE — Op Note (Signed)
[facility] Patient Name: Jusitn Calderon Procedure Date : 05/22/2023 MRN: [id number] Attending MD: [name]. [name] , MD, [id number] Date of Birth: [birth year] CSN: [id number] Age: 62 Admit Type: Inpatient Procedure:                Upper GI endoscopy Indications:              Melena - anemia. History of HIV, recent dx of                            nasopharyngeal mass s/p biopsies. History of NSAID                            use Providers:                [name]. [name], MD, [name],                            RN, Harrington Challenger, Technician Referring MD:              Medicines:                Monitored Anesthesia Care Complications:            No immediate complications. Estimated blood loss:                            Minimal. Estimated Blood Loss:     Estimated blood loss was minimal. Procedure:                Pre-Anesthesia Assessment:                           - Prior to the procedure, a History and Physical                            was performed, and patient medications and                            allergies were reviewed. The patient's tolerance of                            previous anesthesia was also reviewed. The risks                            and benefits of the procedure and the sedation                            options and risks were discussed with the patient.                            All questions were answered, and informed consent                            was obtained. Prior Anticoagulants: The patient has  taken no anticoagulant or antiplatelet agents. ASA                            Grade Assessment: III - A patient with severe                            systemic disease. After reviewing the risks and                            benefits, the patient was deemed in satisfactory                            condition to undergo the procedure.                           After obtaining informed consent,  the endoscope was                            passed under direct vision. Throughout the                            procedure, the patient's blood pressure, pulse, and                            oxygen saturations were monitored continuously. The                            [id number]-H190 ([id number]) Olympus endoscope was introduced                            through the mouth, and advanced to the second part                            of duodenum. The upper GI endoscopy was                            accomplished without difficulty. The patient                            tolerated the procedure well. Scope In: Scope Out: Findings:      Esophagogastric landmarks were identified: the Z-line was found at 35       cm, the gastroesophageal junction was found at 38 cm and the upper       extent of the gastric folds was found at 43 cm from the incisors.      A 5 cm hiatal hernia was present.      There were esophageal mucosal changes classified as suspected Barrett's       stage C2-M3 per Prague criteria present in the lower third of the       esophagus. The maximum longitudinal extent of these mucosal changes was       3 cm in length. There was some slight nodularity to one aspect of it.       Biopsies were taken with a cold forceps for histology.      LA  Grade D esophagitis was found 30 cm from the incisors from the distal       esophagus through 30cm from the incisors. Biopsies were taken with a       cold forceps for histology to exclude viral etiology.      Diffuse, yellow plaques were found in the upper third of the esophagus       and in the middle third of the esophagus, grossly consistent with       esophageal candidiasis.      The exam of the esophagus was otherwise normal.      One non-bleeding cratered gastric ulcer with no stigmata of bleeding was       found in the distal gastric body. The lesion was 15 mm in largest       dimension and was filled with retained vegetable matter which was        lavaged and no stigmata for bleeding was noted.. Biopsies were taken       with a cold forceps for histology from the periphery of the lesion.      The exam of the stomach was otherwise normal.      Biopsies were taken with a cold forceps for Helicobacter pylori testing.      A nodule vs. prominent fold was found in the second portion of the       duodenum near the sweep. There was edema in the sweep diffusely.       Biopsies were taken with a cold forceps for histology.      The exam of the duodenum was otherwise normal. Impression:               - Esophagogastric landmarks identified.                           - 5 cm hiatal hernia.                           - Esophageal mucosal changes classified as                            Barrett's stage C2-M3 per Prague criteria. Biopsied.                           - LA Grade D esophagitis up through 30cm from the                            incisors. Biopsied.                           - Esophageal plaques were found, consistent with                            candidiasis.                           - Non-bleeding gastric ulcer with no stigmata of                            bleeding. Biopsied.                           -  Normal stomach otherwise - biopsies taken to rule                            out H pylori.                           - Nodule vs. prominent fold found in the duodenum                            with duodenal edema. Biopsied.                           Suspect bleeding symptoms were from gastric ulcer /                            severe esophagitis in the setting of NSAID use. Recommendation:           - Return patient to hospital ward for ongoing care.                           - Soft diet                           - Continue present medications.                           - Continue protonix 40mg  twice daily                           - Treat esophageal candidiasis with fluconazole                            400mg  x 1 dose  then 200mg  / daily                           - Hold all NSAIDs                           - Await pathology results with further                            recommendations                           - Trend Hgb, monitor for recurrent bleeding Procedure Code(s):        --- Professional ---                           [phone], Esophagogastroduodenoscopy, flexible,                            transoral; with biopsy, single or multiple Diagnosis Code(s):        --- Professional ---                           K22.70, Barrett's esophagus without dysplasia  K44.9, Diaphragmatic hernia without obstruction or                            gangrene                           K20.90, Esophagitis, unspecified without bleeding                           K22.9, Disease of esophagus, unspecified                           K25.9, Gastric ulcer, unspecified as acute or                            chronic, without hemorrhage or perforation                           K31.89, Other diseases of stomach and duodenum                           K92.1, Melena (includes Hematochezia) CPT copyright 2022 American Medical Association. All rights reserved. The codes documented in this report are preliminary and upon coder review may  be revised to meet current compliance requirements. Viviann Spare P. Alishea Beaudin, MD 05/22/2023 11:06:38 AM This report has been signed electronically. Number of Addenda: 0

## 2023-05-22 NOTE — Transfer of Care (Signed)
Immediate Anesthesia Transfer of Care Note  Patient: Jordan Calderon  Procedure(s) Performed: ESOPHAGOGASTRODUODENOSCOPY (EGD) WITH PROPOFOL BIOPSY  Patient Location: PACU and Endoscopy Unit  Anesthesia Type:MAC  Level of Consciousness: sedated  Airway & Oxygen Therapy: Patient Spontanous Breathing  Post-op Assessment: Report given to RN and Post -op Vital signs reviewed and stable  Post vital signs: Reviewed and stable  Last Vitals:  Vitals Value Taken Time  BP    Temp    Pulse    Resp 14 05/22/23 1059  SpO2    Vitals shown include unfiled device data.  Last Pain:  Vitals:   05/22/23 0920  TempSrc: Temporal  PainSc: 0-No pain      Patients Stated Pain Goal: 2 (05/22/23 0115)  Complications: No notable events documented.

## 2023-05-22 NOTE — Progress Notes (Signed)
[facility] night cross cover note:   I was notified by RN that the patient is requesting a sleeping aid.  He received a dose of Ativan per existing order, but notes refractory insomnia.  I subsequently ordered prn trazodone to help further address his insomnia.    Jordan Pigg, DO Hospitalist

## 2023-05-22 NOTE — Progress Notes (Signed)
Physical Therapy Treatment Patient Details Name: Jordan Calderon MRN: [id number] DOB: Jun 28, 1960 Today's Date: 05/22/2023   History of Present Illness 62 yo male presents to [facility] on 11/27 with lightheadedness, R ear pain, R ptosis, HA, cough, n/v. 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 Recent ED visit 11/21 for syncope workup. PMH includes HIV on [facility] therapy, HLD, depression, anxiety, AKI.    PT Comments  Pt greeted resting in bed and agreeable to session with continued progress towards acute goals. Pt requriing grossly CGA for transfers and gait with RW for support wit no R knee buckling or LOB appreciated this session. Pt reporting improved dizziness this session and demonstrating increased insight into deficits by taking time between positional changes for safety. Pt was educated on continued walker use to maximize functional independence, safety, and decrease risk for falls.  Pt continues to benefit from skilled PT services to progress toward functional mobility goals.     If plan is discharge home, recommend the following: A little help with walking and/or transfers;A little help with bathing/dressing/bathroom   Can travel by private vehicle        Equipment Recommendations  Rolling walker (2 wheels)    Recommendations for Other Services       Precautions / Restrictions Precautions Precautions: Fall Precaution Comments: R ocular headaches vs frontal headaches with mobility, tends to close R eye given pain Restrictions Weight Bearing Restrictions: No     Mobility  Bed Mobility Overal bed mobility: Needs Assistance Bed Mobility: Supine to Sit, Sit to Supine     Supine to sit: Supervision, Used rails Sit to supine: Supervision   General bed mobility comments: supervision for safety, increased time    Transfers Overall transfer level: Needs assistance Equipment used: Rolling walker (2 wheels),  None Transfers: Sit to/from Stand Sit to Stand: Contact guard assist           General transfer comment: CGA for saferty, cues for hand placement on rise    Ambulation/Gait Ambulation/Gait assistance: Contact guard assist Gait Distance (Feet): 200 Feet Assistive device: Rolling walker (2 wheels) Gait Pattern/deviations: Step-through pattern, Decreased stride length, Trunk flexed, Drifts right/left, Narrow base of support, Scissoring Gait velocity: decr     General Gait Details: slow but steady gait with RW for support, no knee buckling appreciated, heavy UE use on RW   Stairs             Wheelchair Mobility     Tilt Bed    Modified Rankin (Stroke Patients Only)       Balance Overall balance assessment: Needs assistance Sitting-balance support: Feet supported Sitting balance-Leahy Scale: Fair     Standing balance support: Single extremity supported, During functional activity Standing balance-Leahy Scale: Fair Standing balance comment: statically                            Cognition Arousal: Alert Behavior During Therapy: WFL for tasks assessed/performed Overall Cognitive Status: Within Functional Limits for tasks assessed                                          Exercises      General Comments General comments (skin integrity, edema, etc.): VSS on RA      Pertinent Vitals/Pain Pain Assessment Pain Assessment: Faces Faces Pain  Scale: Hurts little more Pain Location: R side head Pain Descriptors / Indicators: Pounding, Headache Pain Intervention(s): Monitored during session, Limited activity within patient's tolerance    Home Living                          Prior Function            PT Goals (current goals can now be found in the care plan section) Acute Rehab PT Goals Patient Stated Goal: home PT Goal Formulation: With patient Time For Goal Achievement: 06/01/23 Progress towards PT goals:  Progressing toward goals    Frequency    Min 1X/week      PT Plan      Co-evaluation              AM-PAC PT "6 Clicks" Mobility   Outcome Measure  Help needed turning from your back to your side while in a flat bed without using bedrails?: A Little Help needed moving from lying on your back to sitting on the side of a flat bed without using bedrails?: A Little Help needed moving to and from a bed to a chair (including a wheelchair)?: A Little Help needed standing up from a chair using your arms (e.g., wheelchair or bedside chair)?: A Little Help needed to walk in hospital room?: A Little Help needed climbing 3-5 steps with a railing? : A Lot 6 Click Score: 17    End of Session Equipment Utilized During Treatment: Gait belt Activity Tolerance: Patient tolerated treatment well Patient left: with call bell/phone within reach;in bed;with bed alarm set Nurse Communication: Mobility status PT Visit Diagnosis: Other abnormalities of gait and mobility (R26.89);Muscle weakness (generalized) (M62.81);Dizziness and giddiness (R42)     Time: 1610-9604 PT Time Calculation (min) (ACUTE ONLY): 24 min  Charges:    $Gait Training: 23-37 mins PT General Charges $$ ACUTE PT VISIT: 1 Visit                     [name] R. PTA Acute Rehabilitation Services Office: [phone]   [name] 05/22/2023, 1:40 PM

## 2023-05-23 ENCOUNTER — Inpatient Hospital Stay (HOSPITAL_COMMUNITY): Payer: Medicare Other

## 2023-05-23 DIAGNOSIS — B3781 Candidal esophagitis: Secondary | ICD-10-CM | POA: Diagnosis not present

## 2023-05-23 DIAGNOSIS — R42 Dizziness and giddiness: Secondary | ICD-10-CM | POA: Diagnosis not present

## 2023-05-23 DIAGNOSIS — Z21 Asymptomatic human immunodeficiency virus [HIV] infection status: Secondary | ICD-10-CM

## 2023-05-23 DIAGNOSIS — C8599 Non-Hodgkin lymphoma, unspecified, extranodal and solid organ sites: Secondary | ICD-10-CM | POA: Diagnosis not present

## 2023-05-23 DIAGNOSIS — K2270 Barrett's esophagus without dysplasia: Secondary | ICD-10-CM | POA: Diagnosis not present

## 2023-05-23 DIAGNOSIS — K3189 Other diseases of stomach and duodenum: Secondary | ICD-10-CM | POA: Diagnosis not present

## 2023-05-23 LAB — BASIC METABOLIC PANEL WITH GFR
Anion gap: 8 (ref 5–15)
BUN: 25 mg/dL — ABNORMAL HIGH (ref 8–23)
CO2: 24 mmol/L (ref 22–32)
Calcium: 8.6 mg/dL — ABNORMAL LOW (ref 8.9–10.3)
Chloride: 101 mmol/L (ref 98–111)
Creatinine, Ser: 1.32 mg/dL — ABNORMAL HIGH (ref 0.61–1.24)
GFR, Estimated: >60 mL/min
Glucose, Bld: 141 mg/dL — ABNORMAL HIGH (ref 70–99)
Potassium: 4 mmol/L (ref 3.5–5.1)
Sodium: 133 mmol/L — ABNORMAL LOW (ref 135–145)

## 2023-05-23 LAB — CBC
HCT: 29.5 % — ABNORMAL LOW (ref 39.0–52.0)
Hemoglobin: 9.3 g/dL — ABNORMAL LOW (ref 13.0–17.0)
MCH: 31 pg (ref 26.0–34.0)
MCHC: 31.5 g/dL (ref 30.0–36.0)
MCV: 98.3 fL (ref 80.0–100.0)
Platelets: 259 10*3/uL (ref 150–400)
RBC: 3 MIL/uL — ABNORMAL LOW (ref 4.22–5.81)
RDW: 13.2 % (ref 11.5–15.5)
WBC: 8.3 10*3/uL (ref 4.0–10.5)
nRBC: 0 % (ref 0.0–0.2)

## 2023-05-23 LAB — PROTIME-INR
INR: 1.1 (ref 0.8–1.2)
Prothrombin Time: 14.4 s (ref 11.4–15.2)

## 2023-05-23 MED ORDER — IOHEXOL 350 MG/ML SOLN
75.0000 mL | Freq: Once | INTRAVENOUS | Status: AC | PRN
  Administered 2023-05-23: 75 mL via INTRAVENOUS

## 2023-05-23 MED ORDER — FENTANYL CITRATE (PF) 100 MCG/2ML IJ SOLN
INTRAMUSCULAR | Status: AC
  Filled 2023-05-23: qty 4

## 2023-05-23 MED ORDER — PANTOPRAZOLE SODIUM 40 MG PO TBEC
40.0000 mg | DELAYED_RELEASE_TABLET | Freq: Two times a day (BID) | ORAL | Status: DC
  Administered 2023-05-23 – 2023-05-24 (×2): 40 mg via ORAL
  Filled 2023-05-23 (×2): qty 1

## 2023-05-23 MED ORDER — LIDOCAINE HCL (PF) 1 % IJ SOLN
INTRAMUSCULAR | Status: AC
Start: 2023-05-23 — End: ?
  Filled 2023-05-23: qty 30

## 2023-05-23 MED ORDER — LIDOCAINE HCL (PF) 1 % IJ SOLN
10.0000 mL | Freq: Once | INTRAMUSCULAR | Status: AC
  Administered 2023-05-23: 10 mL via INTRADERMAL

## 2023-05-23 MED ORDER — OXYCODONE HCL 5 MG PO TABS
5.0000 mg | ORAL_TABLET | ORAL | Status: DC | PRN
  Administered 2023-05-23 (×2): 5 mg via ORAL
  Filled 2023-05-23 (×2): qty 1

## 2023-05-23 MED ORDER — MIDAZOLAM HCL 2 MG/2ML IJ SOLN
INTRAMUSCULAR | Status: AC | PRN
  Administered 2023-05-23 (×2): .5 mg via INTRAVENOUS

## 2023-05-23 MED ORDER — MIDAZOLAM HCL 2 MG/2ML IJ SOLN
INTRAMUSCULAR | Status: AC
  Filled 2023-05-23: qty 4

## 2023-05-23 MED ORDER — FENTANYL CITRATE (PF) 100 MCG/2ML IJ SOLN
INTRAMUSCULAR | Status: AC | PRN
  Administered 2023-05-23: 25 ug via INTRAVENOUS

## 2023-05-23 NOTE — Progress Notes (Addendum)
PT Cancellation Note  Patient Details Name: JAXS ERRINGTON MRN: [id number] DOB: Aug 12, 1960   Cancelled Treatment:    Reason Eval/Treat Not Completed: Patient at procedure or test/unavailable  08:20 Patient off the floor.  09:30 pt on bedrest x 3 hours   Jerolyn Center, PT Acute Rehabilitation Services  Office [phone]  Zena Amos 05/23/2023, 8:20 AM

## 2023-05-23 NOTE — Anesthesia Postprocedure Evaluation (Signed)
Anesthesia Post Note  Patient: Jordan Calderon  Procedure(s) Performed: ESOPHAGOGASTRODUODENOSCOPY (EGD) WITH PROPOFOL BIOPSY     Patient location during evaluation: PACU Anesthesia Type: MAC Level of consciousness: awake and alert Pain management: pain level controlled Vital Signs Assessment: post-procedure vital signs reviewed and stable Respiratory status: spontaneous breathing, nonlabored ventilation, respiratory function stable and patient connected to nasal cannula oxygen Cardiovascular status: stable and blood pressure returned to baseline Postop Assessment: no apparent nausea or vomiting Anesthetic complications: no   No notable events documented.  Last Vitals:  Vitals:   05/23/23 1158 05/23/23 1228  BP: 132/81 115/71  Pulse: 72 79  Resp:    Temp:    SpO2: 96% 97%    Last Pain:  Vitals:   05/23/23 1158  TempSrc:   PainSc: 3                  Jazmeen Axtell P Lucia Harm

## 2023-05-23 NOTE — Progress Notes (Signed)
PROGRESS NOTE    Jordan GODETTE  ZOX:[id number] DOB: Apr 20, 1961 DOA: 05/17/2023 PCP: Pcp, No    Brief Narrative:    62 y.o. male past medical history significant for HIV on Hart therapy with a last CD4 count of 132 recent admission for syncopal episode and acute kidney injury during this admission he had a TEE that showed an EF of 60% no regional wall motion abnormality, was aggressively hydrated, CT of the head showed no acute findings.  Relates that since discharge he has been feeling weak having more headaches.  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  ENT, GI and oncology were consulted.  EGD on 12/2 showed esophagitis, nonbleeding gastric ulcer, possible duodenitis.  GI recommending PPI twice daily and fluconazole.  ENT biopsies negative therefore IR consulted.  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  This is seen on the scans.  High concerns of nasopharyngeal carcinoma.  ENT performed biopsy which is negative therefore IR consulted for ultrasound-guided liver biopsy.Status post liver biopsy 12/3. CT abdomen pelvis for complete staging PET scan, EBV and any other further testing per oncology   Normocytic anemia: Secondary to melena Can esophagitis, nonbleeding gastric ulcer Underwent endoscopy on 12/2.  Biopsies are currently pending Continue PPI twice daily and daily fluconazole.   HIV disease: Continue HAART therapy, continue dapsone.  Symtuza   Hypothyroidism: Continue Synthroid.   DVT prophylaxis: lovenox Family Communication:none Status is:  Inpatient Remains inpatient appropriate because: monitoring for bleeding, IR biopsy of liver mass      Subjective:  Doing okay no complaints at this time. Did well with his liver biopsy  Examination:  General exam: Appears calm and comfortable  Respiratory system: Clear to auscultation. Respiratory effort normal. Cardiovascular system: S1 & S2 heard, RRR. No JVD, murmurs, rubs, gallops or clicks. No pedal edema. Gastrointestinal system: Abdomen is nondistended, soft and nontender. No organomegaly or masses felt. Normal bowel sounds heard. Central nervous system: Alert and oriented. No focal neurological deficits. Extremities: Symmetric 5 x 5 power. Skin: No rashes, lesions or ulcers Psychiatry: Judgement and insight appear normal. Mood & affect appropriate.                Diet Orders (From admission, onward)     Start     Ordered   05/23/23 0936  Diet regular Room service appropriate? Yes; Fluid consistency: Thin  Diet effective now       Question Answer Comment  Room service appropriate? Yes   Fluid consistency: Thin      05/23/23 0935            Objective: Vitals:   05/23/23 1048 05/23/23 1128 05/23/23 1158 05/23/23 1228  BP: 112/83 126/83 132/81 115/71  Pulse: 82 81 72 79  Resp:      Temp:  98.1 F (36.7 C)    TempSrc:  Oral    SpO2: 97% 97% 96% 97%  Weight:      Height:        Intake/Output Summary (Last 24 hours) at 05/23/2023 1508 Last data filed at 05/23/2023  0030 Gross per 24 hour  Intake --  Output 1000 ml  Net -1000 ml   Filed Weights   05/17/23 1157 05/17/23 1736 05/19/23 1031  Weight: 74.6 kg 72.5 kg 72.6 kg    Scheduled Meds:  calcium carbonate  1 tablet Oral TID WC   dapsone  100 mg Oral Q breakfast   Darunavir-Cobicistat-Emtricitabine-Tenofovir Alafenamide  1 tablet Oral Q breakfast   fluconazole  200 mg Oral Daily   pantoprazole  40 mg Oral BID AC   Continuous Infusions:  Nutritional status     Body mass index is  23.63 kg/m.  Data Reviewed:   CBC: Recent Labs  Lab 05/17/23 1232 05/17/23 1741 05/18/23 0452 05/22/23 0709 05/23/23 0524  WBC 8.7 9.6 6.9 10.5 8.3  HGB 10.2* 9.8* 9.1* 9.4* 9.3*  HCT 30.3* 28.9* 27.2* 28.9* 29.5*  MCV 96.2 95.1 96.5 97.6 98.3  PLT 261 250 215 285 259   Basic Metabolic Panel: Recent Labs  Lab 05/17/23 1232 05/17/23 1741 05/18/23 0452 05/22/23 0709 05/23/23 0524  NA 135  --  132* 136 133*  K 4.3  --  4.1 4.7 4.0  CL 99  --  104 104 101  CO2 25  --  22 25 24   GLUCOSE 110*  --  103* 104* 141*  BUN 20  --  17 26* 25*  CREATININE 1.17 1.15 1.08 1.11 1.32*  CALCIUM 9.9  --  8.4* 8.8* 8.6*   GFR: Estimated Creatinine Clearance: 57.3 mL/min (A) (by C-G formula based on SCr of 1.32 mg/dL (H)). Liver Function Tests: Recent Labs  Lab 05/18/23 0452  AST 24  ALT 26  ALKPHOS 87  BILITOT 0.5  PROT 7.0  ALBUMIN 3.1*   No results for input(s): "LIPASE", "AMYLASE" in the last 168 hours. No results for input(s): "AMMONIA" in the last 168 hours. Coagulation Profile: Recent Labs  Lab 05/23/23 0524  INR 1.1   Cardiac Enzymes: No results for input(s): "CKTOTAL", "CKMB", "CKMBINDEX", "TROPONINI" in the last 168 hours. BNP (last 3 results) No results for input(s): "PROBNP" in the last 8760 hours. HbA1C: No results for input(s): "HGBA1C" in the last 72 hours. CBG: No results for input(s): "GLUCAP" in the last 168 hours. Lipid Profile: No results for input(s): "CHOL", "HDL", "LDLCALC", "TRIG", "CHOLHDL", "LDLDIRECT" in the last 72 hours. Thyroid Function Tests: No results for input(s): "TSH", "T4TOTAL", "FREET4", "T3FREE", "THYROIDAB" in the last 72 hours. Anemia Panel: No results for input(s): "VITAMINB12", "FOLATE", "FERRITIN", "TIBC", "IRON", "RETICCTPCT" in the last 72 hours. Sepsis Labs: No results for input(s): "PROCALCITON", "LATICACIDVEN" in the last 168 hours.  Recent Results (from the past 240 hour(s))  Blood culture (routine x 2)     Status:  None   Collection Time: 05/17/23 12:13 PM   Specimen: BLOOD  Result Value Ref Range Status   Specimen Description   Final    BLOOD LEFT ANTECUBITAL Performed at [facility], [address] [place], [place] [address]    Special Requests   Final    BOTTLES DRAWN AEROBIC AND ANAEROBIC Blood Culture adequate volume Performed at [facility], [address] [place], [place] [address]    Culture   Final    NO GROWTH 5 DAYS Performed at [facility], [address] [place], [place] [address]    Report Status 05/22/2023 FINAL  Final  Blood culture (routine x 2)     Status: None   Collection Time: 05/17/23 12:18 PM  Specimen: BLOOD RIGHT HAND  Result Value Ref Range Status   Specimen Description   Final    BLOOD RIGHT HAND Performed at [facility], [address], [place], [place] [address]    Special Requests   Final    BOTTLES DRAWN AEROBIC AND ANAEROBIC Blood Culture adequate volume Performed at [facility], [address] [place], [place] [address]    Culture   Final    NO GROWTH 5 DAYS Performed at [facility], [address] [place], [place] [address]    Report Status 05/22/2023 FINAL  Final  Surgical pcr screen     Status: None   Collection Time: 05/18/23  8:38 PM   Specimen: Nasal Mucosa; Nasal Swab  Result Value Ref Range Status   MRSA, PCR NEGATIVE NEGATIVE Final   Staphylococcus aureus NEGATIVE NEGATIVE Final    Comment: (NOTE) The Xpert SA Assay (FDA approved for NASAL specimens in patients 25 years of age and older), is one component of a comprehensive surveillance program. It is not intended to diagnose infection nor to guide or monitor treatment. Performed at [facility], [address], [place], [place] [address]          Radiology Studies: US BIOPSY (LIVER)  Result Date: 05/23/2023 INDICATION: 62 year old with multiple liver lesions. Findings are suggestive for metastatic disease.  Diagnosis is needed. EXAM: ULTRASOUND-GUIDED LIVER LESION BIOPSY MEDICATIONS: Moderate sedation ANESTHESIA/SEDATION: Moderate (conscious) sedation was employed during this procedure. A total of Versed 1 mg and Fentanyl 25 mcg was administered intravenously by the radiology nurse. Total intra-service moderate Sedation Time: 18 minutes. The patient's level of consciousness and vital signs were monitored continuously by radiology nursing throughout the procedure under my direct supervision. FLUOROSCOPY TIME:  None COMPLICATIONS: None immediate. PROCEDURE: Informed written consent was obtained from the patient after a thorough discussion of the procedural risks, benefits and alternatives. All questions were addressed. Maximal Sterile Barrier Technique was utilized including caps, mask, sterile gowns, sterile gloves, sterile drape, hand hygiene and skin antiseptic. A timeout was performed prior to the initiation of the procedure. Abdomen was evaluated with ultrasound. A right hepatic lesion was targeted. The right side of the abdomen was prepped with chlorhexidine and sterile field was created. Skin was anesthetized using 1% lidocaine. Small incision was made. Using ultrasound guidance, 17 gauge coaxial needle was directed into a right hepatic lesion. Total of 3 core biopsies were performed with an 18 gauge core device. Three adequate specimens obtained and placed in formalin. 17 gauge coaxial needle was removed without complication. Bandage placed over the puncture site. FINDINGS: Several hyperechoic lesions scattered throughout the liver. A lesion in the right hepatic lobe was targeted. Biopsy needle confirmed within the lesion. Three adequate specimens obtained. No immediate bleeding or hematoma formation. IMPRESSION: Successful ultrasound-guided core biopsies from a right hepatic lesion. Electronically Signed   By: Richarda Overlie M.D.   On: 05/23/2023 10:22           LOS: 6 days   Time spent= 35  mins    Miguel Rota, MD [facility]  If 7PM-7AM, please contact night-coverage  05/23/2023, 3:08 PM

## 2023-05-23 NOTE — Care Management Important Message (Signed)
Important Message  Patient Details  Name: Jordan Calderon MRN: [id number] Date of Birth: 03/29/1961   Important Message Given:  Yes - Medicare IM Patient left prior to IM delivery will mail a copy fo the IM to the patient home address.     Jett Kulzer 05/23/2023, 3:18 PM

## 2023-05-23 NOTE — Procedures (Signed)
Interventional Radiology Procedure:   Indications: Liver lesions are suggestive for metastatic disease and needs tissue diagnosis  Procedure: US guided liver lesion biopsy  Findings: Several hypoechoic liver lesions.  3 core biopsies from a right hepatic lesion.  No immediate bleeding or hematoma formation.   Complications: None     EBL: Minimal  Plan: Bedrest 3 hours  Jasani Lengel R. Lowella Dandy, MD  Pager: [phone]

## 2023-05-23 NOTE — Progress Notes (Addendum)
1610 Patient has left floor with transport for liver biopsy.  9604 Patient has returned to room post liver biopsy. NAD.  1318 Patient has left floor for CT scan with transport.

## 2023-05-23 NOTE — Evaluation (Addendum)
Clinical/Bedside Swallow Evaluation Patient Details  Name: Jordan Calderon MRN: [id number] Date of Birth: Feb 24, 1961  Today's Date: 05/23/2023 Time: SLP Start Time (ACUTE ONLY): 1349 SLP Stop Time (ACUTE ONLY): 1410 SLP Time Calculation (min) (ACUTE ONLY): 21 min  Past Medical History:  Past Medical History:  Diagnosis Date   Anxiety    Cellulitis 02/11/2014   LEFT ARM   Depression    Headache(784.0)    HIV (human immunodeficiency virus infection) (HCC)    Past Surgical History:  Past Surgical History:  Procedure Laterality Date   ARM HARDWARE REMOVAL Left    arm surgery     HEMORRHOID SURGERY     NASOPHARYNGEAL BIOPSY N/A 05/19/2023   Procedure: NASOPHARYNGEAL BIOPSY;  Surgeon: Newman Pies, MD;  Location: [facility] OR;  Service: ENT;  Laterality: N/A;   HPI:  Jordan Calderon is a 62 yo male who presented to [facility] on 11/27 with lightheadedness, R ear pain, R ptosis, HA, cough, n/v. 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   CT NECK FINDINGS:  Pharynx and larynx: There is a large right nasopharyngeal mass that  extends inferiorly to the level the greater horn of the hyoid bone  on right. This mass involves the right-sided adenoid and palatine  tonsils, the right parapharyngeal space, the right masticator space,  the right parotid space. Superiorly this mass likely extends to the  level of the foramen ovale bilaterally.    CT CHEST showed liver lesions concerning for mets; patulous esophagus.   Results of biopsies pending. EGD 12/2: gastric ulcer, severe esophagitis, candidiasis. ENT and oncology following. PMH includes HIV on Hart therapy, HLD, depression, anxiety, AKI.    Assessment / Plan / Recommendation  Clinical Impression  Jordan Calderon presented with s/s of a mild dysphagia (pharyngoesophageal).  Oral mechanism exam revealed inconsistent tongue and palatal asymmetry.  Speech resonance was normal.  He reported occasional globus  as well as sensation of food lodging in his throat.  PTA, he stated he would chew solid foods and often have to "bring them up" prior to swallowing.  SIngle sips of water were [facility], but when swallowing dual consistencies of solids and liquids, he began coughing (affirmed this happened prior to admission.) CT findings of patulous esophagus and EGD findings of severe esophagus may be contributing to s/s of dysphagia as well. Recommend proceeding with MBS to determine baseline physiology of swallow before he begins potential radiation therapy.  He will need a referral to OP SLP to address the effects of radiation-associated dysphagia.  Discussed basic plan with Jordan Calderon. He is in agreement. He voiced his feelings of overwhelm; provided encouragement.  SLP will follow while admitted. SLP Visit Diagnosis: Dysphagia, unspecified (R13.10)    Aspiration Risk    tba   Diet Recommendation   Thin;Age appropriate regular  Medication Administration: Whole meds with liquid    Other  Recommendations Oral Care Recommendations: Oral care BID   Speech/language evaluation not indicated. Screened - WNL. Completed order.    Recommendations for follow up therapy are one component of a multi-disciplinary discharge planning process, led by the attending physician.  Recommendations may be updated based on patient status, additional functional criteria and insurance authorization.  Follow up Recommendations Outpatient SLP          Functional Status Assessment Patient has had a recent decline in their functional status and demonstrates the ability to make significant improvements in function in a reasonable and predictable amount of time.  Swallow Study   General HPI: Jordan Calderon is a 62 yo male who presented to [facility] on 11/27 with lightheadedness, R ear pain, R ptosis, HA, cough, n/v. 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 CT NECK FINDINGS:   Pharynx and larynx: There is a large right nasopharyngeal mass that  extends inferiorly to the level the greater horn of the hyoid bone  on right. This mass involves the right-sided adenoid and palatine  tonsils, the right parapharyngeal space, the right masticator space,  the right parotid space. Superiorly this mass likely extends to the  level of the foramen ovale bilaterally.  CT CHEST showed liver lesions concerning for mets; patulous esophagus. Results of biopsies pending. EGD 12/2: gastric ulcer, severe esophagitis, candidiasis. ENT and oncology following. PMH includes HIV on Hart therapy, HLD, depression, anxiety, AKI. Type of Study: Bedside Swallow Evaluation Previous Swallow Assessment: no Diet Prior to this Study: Regular;Thin liquids (Level 0) Temperature Spikes Noted: No Respiratory Status: Room air History of Recent Intubation: No Behavior/Cognition: Alert;Cooperative Oral Cavity Assessment: Within Functional Limits Oral Care Completed by SLP: No Oral Cavity - Dentition: Adequate natural dentition Vision: Functional for self-feeding Self-Feeding Abilities: Able to feed self Patient Positioning: Upright in bed Baseline Vocal Quality: Normal Volitional Cough: Strong Volitional Swallow: Able to elicit    Oral/Motor/Sensory Function Overall Oral Motor/Sensory Function:  (palatal asymmetry; inconsistent tongue asymmetry)   Ice Chips Ice chips: Within functional limits   Thin Liquid Thin Liquid: Impaired Presentation: Cup Pharyngeal  Phase Impairments: Cough - Delayed    Nectar Thick Nectar Thick Liquid: Not tested   Honey Thick Honey Thick Liquid: Not tested   Puree Puree: Within functional limits   Solid     Solid: Within functional limits      Jordan Calderon 05/23/2023,2:48 PM  Jordan Folks L. Samson Frederic, MA CCC/SLP Clinical Specialist - Acute Care SLP Acute Rehabilitation Services Office number [phone]

## 2023-05-23 NOTE — Plan of Care (Signed)

## 2023-05-23 NOTE — Hospital Course (Addendum)
Brief Narrative:    62 y.o. male past medical history significant for HIV on Hart therapy with a last CD4 count of 132 recent admission for syncopal episode and acute kidney injury during this admission he had a TEE that showed an EF of 60% no regional wall motion abnormality, was aggressively hydrated, CT of the head showed no acute findings.  Relates that since discharge he has been feeling weak having more headaches.  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  ENT, GI and oncology were consulted.  EGD on 12/2 showed esophagitis, nonbleeding gastric ulcer, possible duodenitis.  GI recommending PPI twice daily and fluconazole.  ENT biopsies negative therefore IR consulted.  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  This is seen on the scans.  High concerns of nasopharyngeal carcinoma.  ENT performed biopsy which is negative therefore IR consulted for ultrasound-guided liver biopsy.Status post liver biopsy 12/3. CT abdomen pelvis for complete staging PET scan, EBV and any other further testing per oncology   Normocytic anemia: Secondary to melena Can esophagitis, nonbleeding gastric ulcer Underwent endoscopy on 12/2.  Biopsies are currently pending Continue PPI twice daily and daily fluconazole.   HIV disease: Continue HAART therapy, continue dapsone.  Symtuza   Hypothyroidism: Continue Synthroid.   DVT prophylaxis: lovenox Family Communication:none Status is: Inpatient Remains inpatient appropriate because: monitoring for bleeding, IR biopsy of liver mass       Subjective:  Doing okay no complaints at this time. Did well with his liver biopsy  Examination:  General exam: Appears calm and comfortable  Respiratory system: Clear to auscultation. Respiratory effort normal. Cardiovascular system: S1 & S2 heard, RRR. No JVD, murmurs, rubs, gallops or clicks. No pedal edema. Gastrointestinal system: Abdomen is nondistended, soft and nontender. No organomegaly or masses felt. Normal bowel sounds heard. Central nervous system: Alert and oriented. No focal neurological deficits. Extremities: Symmetric 5 x 5 power. Skin: No rashes, lesions or ulcers Psychiatry: Judgement and insight appear normal. Mood & affect appropriate.

## 2023-05-23 NOTE — Progress Notes (Signed)
      Progress Note   Subjective  Patient denies any bleeding symptoms, recovered from EGD okay. Hgb stable today. Had a liver biopsy with IR this AM.   Objective   Vital signs in last 24 hours: Temp:  [97.7 F (36.5 C)-98.3 F (36.8 C)] 98 F (36.7 C) (12/03 0034) Pulse Rate:  [58-79] 68 (12/03 1003) Resp:  [11-20] 15 (12/03 0918) BP: (98-139)/(69-99) 119/82 (12/03 1003) SpO2:  [93 %-98 %] 98 % (12/03 1003) Last BM Date : 05/19/23 (per documentation) General:    white male in NAD Neurologic:  Alert and oriented,  grossly normal neurologically. Psych:  Cooperative. Normal mood and affect.  Intake/Output from previous day: 12/02 0701 - 12/03 0700 In: 300 [I.V.:300] Out: 1000 [Urine:1000] Intake/Output this shift: No intake/output data recorded.  Lab Results: Recent Labs    05/22/23 0709 05/23/23 0524  WBC 10.5 8.3  HGB 9.4* 9.3*  HCT 28.9* 29.5*  PLT 285 259   BMET Recent Labs    05/22/23 0709 05/23/23 0524  NA 136 133*  K 4.7 4.0  CL 104 101  CO2 25 24  GLUCOSE 104* 141*  BUN 26* 25*  CREATININE 1.11 1.32*  CALCIUM 8.8* 8.6*   LFT No results for input(s): "PROT", "ALBUMIN", "AST", "ALT", "ALKPHOS", "BILITOT", "BILIDIR", "IBILI" in the last 72 hours. PT/INR Recent Labs    05/23/23 0524  LABPROT 14.4  INR 1.1    Studies/Results: No results found.     Assessment / Plan:    62 y/o male here with the following:  Melena - secondary to gastric ulcer / esophagitis  Anemia Barrett's esophagus Esophageal candidiasis Nasopharyngeal mass with liver lesions History of HIV  EGD yesterday with multiple findings - I suspect his anemia / bleeding symptoms were due to gastric ulcer and severe esophagitis. NO high risk stigmata for bleeding. Biopsies taken to rule out H pylori and viral causes, although he has been taking NSAIDs and could be related to that. I do think he has Barrett's esophagus, biopsies taken to assess for this and rule out dysplasia.  He also incidentally had esophageal candidiasis.  He would benefit from protonix 40mg  BID for now and also being treated with fluconazole for candidiasis and would continue fluconazole or at least 2-3 weeks for treatment. He should stay on twice daily protonix for now, and can consider a repeat EGD in 2-3 months or so to assess for mucosal healing on PPI and reassess Barrett's, although will need to await his course and liver biopsy findings to determine if he would be a candidate for follow up elective EGD.   He understands all of this, all questions answered. No further bleeding.  PLAN: - continue protonix 40mg  BID for now until follow up EGD - continue fluconazole - advance diet as tolerated - await pending pathology results - avoid all NSAIDs as outpatient  - consideration for follow up EGD in a few months pending his course. Need to await his malignancy workup and determine if he is a candidate for EGD. We can follow him up as outpatient, our office can coordinate.  We will sign off for now, call with questions in the interim.  Harlin Rain, MD [facility] Gastroenterology

## 2023-05-24 ENCOUNTER — Inpatient Hospital Stay (HOSPITAL_COMMUNITY): Payer: Medicare Other

## 2023-05-24 ENCOUNTER — Other Ambulatory Visit (HOSPITAL_COMMUNITY): Payer: Self-pay

## 2023-05-24 ENCOUNTER — Other Ambulatory Visit: Payer: Self-pay

## 2023-05-24 DIAGNOSIS — R42 Dizziness and giddiness: Secondary | ICD-10-CM | POA: Diagnosis not present

## 2023-05-24 DIAGNOSIS — Z7689 Persons encountering health services in other specified circumstances: Secondary | ICD-10-CM

## 2023-05-24 LAB — BASIC METABOLIC PANEL
Anion gap: 7 (ref 5–15)
BUN: 23 mg/dL (ref 8–23)
CO2: 25 mmol/L (ref 22–32)
Calcium: 8.7 mg/dL — ABNORMAL LOW (ref 8.9–10.3)
Chloride: 101 mmol/L (ref 98–111)
Creatinine, Ser: 1.24 mg/dL (ref 0.61–1.24)
GFR, Estimated: >60 mL/min (ref >60)
Glucose, Bld: 102 mg/dL — ABNORMAL HIGH (ref 70–99)
Potassium: 4.7 mmol/L (ref 3.5–5.1)
Sodium: 133 mmol/L — ABNORMAL LOW (ref 135–145)

## 2023-05-24 LAB — CBC
HCT: 31.5 % — ABNORMAL LOW (ref 39.0–52.0)
Hemoglobin: 10.2 g/dL — ABNORMAL LOW (ref 13.0–17.0)
MCH: 32 pg (ref 26.0–34.0)
MCHC: 32.4 g/dL (ref 30.0–36.0)
MCV: 98.7 fL (ref 80.0–100.0)
Platelets: 276 10*3/uL (ref 150–400)
RBC: 3.19 MIL/uL — ABNORMAL LOW (ref 4.22–5.81)
RDW: 13.2 % (ref 11.5–15.5)
WBC: 7.6 10*3/uL (ref 4.0–10.5)
nRBC: 0 % (ref 0.0–0.2)

## 2023-05-24 MED ORDER — PANTOPRAZOLE SODIUM 40 MG PO TBEC
40.0000 mg | DELAYED_RELEASE_TABLET | Freq: Two times a day (BID) | ORAL | 0 refills | Status: DC
  Filled 2023-05-24: qty 60, 30d supply, fill #0

## 2023-05-24 MED ORDER — FLUCONAZOLE 200 MG PO TABS
200.0000 mg | ORAL_TABLET | Freq: Every day | ORAL | 0 refills | Status: DC
  Filled 2023-05-24: qty 30, 30d supply, fill #0

## 2023-05-24 NOTE — TOC Transition Note (Signed)
Transition of Care [facility]) - CM/SW Discharge Note   Patient Details  Name: Jordan Calderon MRN: [id number] Date of Birth: 10-12-1960  Transition of Care [facility]) CM/SW Contact:  Kermit Balo, RN Phone Number: 05/24/2023, 10:25 AM   Clinical Narrative:     Pt is discharging home with home health services through [facility]. Information on the AVS. [facility] will contact him for the first home visit. Walker for home is in the room.  Pt has transportation home.  Final next level of care: Home w Home Health Services Barriers to Discharge: No Barriers Identified   Patient Goals and CMS Choice CMS Medicare.gov Compare Post Acute Care list provided to:: Patient Choice offered to / list presented to : Patient  Discharge Placement                         Discharge Plan and Services Additional resources added to the After Visit Summary for     Discharge Planning Services: CM Consult Post Acute Care Choice: Home Health, Durable Medical Equipment          DME Arranged: Walker rolling DME Agency: [employer] Date DME Agency Contacted: 05/19/23   Representative spoke with at DME Agency: [facility] HH Arranged: PT, OT, Nurse's Aide HH Agency: [facility] Date HH Agency Contacted: 05/24/23   Representative spoke with at [facility] Agency: Adele Dan awar of d/c home today  Social Determinants of Health (SDOH) Interventions SDOH Screenings   Food Insecurity: No Food Insecurity (05/17/2023)  Housing: Low Risk  (05/17/2023)  Recent Concern: Housing - High Risk (05/12/2023)  Transportation Needs: No Transportation Needs (05/17/2023)  Utilities: Not At Risk (05/17/2023)  Depression (PHQ2-9): Low Risk  (04/19/2023)  Tobacco Use: Medium Risk (05/22/2023)     Readmission Risk Interventions     No data to display

## 2023-05-24 NOTE — Plan of Care (Signed)

## 2023-05-24 NOTE — Progress Notes (Signed)
Pt has been discharged home. IV has been removed. Catheter is intact. Pt and sister have received discharge instructions and state understanding. Pt has left the floor with volunteer services with all belongings intact. Pt aware of TOC meds in pharmacy.

## 2023-05-24 NOTE — Discharge Summary (Signed)
Physician Discharge Summary  Jordan Calderon ZOX:[id number] DOB: 1961-06-03 DOA: 05/17/2023  PCP: Oneita Hurt, No  Admit date: 05/17/2023 Discharge date: 05/24/2023  Admitted From: Home Disposition: Home  Recommendations for Outpatient Follow-up:  Follow up with PCP in 1-2 weeks Please obtain BMP/CBC in one week your next doctors visit.  Outpatient follow-up with oncology Advised to take Protonix twice daily before meals and fluconazole daily.  Will need to follow-up outpatient GI.  Will need endoscopy in the near future to reevaluate gastric ulcer   Discharge Condition: Stable CODE STATUS: Full code Diet recommendation: Regular  Brief/Interim Summary: Brief Narrative:    62 y.o. male past medical history significant for HIV on Hart therapy with a last CD4 count of 132 recent admission for syncopal episode and acute kidney injury during this admission he had a TEE that showed an EF of 60% no regional wall motion abnormality, was aggressively hydrated, CT of the head showed no acute findings.  Relates that since discharge he has been feeling weak having more headaches.  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  ENT, GI and oncology were consulted.  EGD on 12/2 showed esophagitis, nonbleeding gastric ulcer, possible duodenitis.  GI recommending PPI twice daily and fluconazole.  ENT biopsies negative therefore IR consulted.  IR was able to obtain biopsy on 12/3.  I discussed case with Dr. Arlana Pouch from oncology who will follow-up patient outpatient otherwise cleared for discharge.  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  This is seen on the scans.  High concerns of nasopharyngeal carcinoma.  ENT performed biopsy which is negative therefore IR consulted for ultrasound-guided liver biopsy.Status post liver biopsy 12/3. CT abdomen pelvis showing multiple liver lesions.  Also showing gastric ulcer which was also seen on endoscopy.  Discussed with oncology Dr Arlana Pouch, will follow-up outpatient with biopsy results. PET scan, EBV and any other further testing per oncology   Normocytic anemia: Secondary to melena Candida esophagitis, nonbleeding gastric ulcer Underwent endoscopy on 12/2.  Biopsies are currently pending Continue PPI twice daily and daily fluconazole.   HIV disease: Continue HAART therapy, continue dapsone.  Symtuza   Hypothyroidism: Continue Synthroid.   DVT prophylaxis: lovenox Family Communication:none Status is: Inpatient Remains inpatient appropriate because: Today   Subjective: Doing well no complaints  Examination:  General exam: Appears calm and comfortable  Respiratory system: Clear to auscultation. Respiratory effort normal. Cardiovascular system: S1 & S2 heard, RRR. No JVD, murmurs, rubs, gallops or clicks. No pedal edema. Gastrointestinal system: Abdomen is nondistended, soft and nontender. No organomegaly or masses felt. Normal bowel sounds heard. Central nervous system: Alert and oriented. No focal neurological deficits. Extremities: Symmetric 5 x 5 power. Skin: No rashes, lesions or ulcers Psychiatry: Judgement and insight appear normal. Mood & affect appropriate.    Discharge Diagnoses:  Principal Problem:   Vertigo Active Problems:   Oropharyngeal mass   Deviated nasal septum   Hypertrophy of nasal turbinates   Chronic rhinitis   Dizziness   Melena   Acute gastric ulcer with hemorrhage   Duodenal nodule  Barrett's esophagus without dysplasia   Acute esophagitis     Discharge Exam: Vitals:   05/24/23 0730  05/24/23 1218  BP: 112/71 (!) 148/93  Pulse: 65 89  Resp: 17 17  Temp: 97.9 F (36.6 C) 98.2 F (36.8 C)  SpO2: 95% 99%   Vitals:   05/24/23 0020 05/24/23 0500 05/24/23 0730 05/24/23 1218  BP: 107/74 112/73 112/71 (!) 148/93  Pulse: 67 84 65 89  Resp: 14 16 17 17   Temp: 98.4 F (36.9 C) 97.9 F (36.6 C) 97.9 F (36.6 C) 98.2 F (36.8 C)  TempSrc: Oral Oral Oral Oral  SpO2: 95% 96% 95% 99%  Weight:      Height:         Discharge Instructions   Allergies as of 05/24/2023       Reactions   Sulfonamide Derivatives Hives, Itching        Medication List     TAKE these medications    calcium carbonate 500 MG chewable tablet Commonly known as: TUMS - dosed in mg elemental calcium Chew 1 tablet by mouth 3 (three) times daily with meals.   dapsone 100 MG tablet Take 1 tablet (100 mg total) by mouth daily. What changed: when to take this   Darunavir-Cobicistat-Emtricitabine-Tenofovir Alafenamide 800-150-200-10 MG Tabs Commonly known as: SYMTUZA Take 1 tablet by mouth daily with breakfast.   fluconazole 200 MG tablet Commonly known as: DIFLUCAN Take 1 tablet (200 mg total) by mouth daily. Start taking on: May 25, 2023   ibuprofen 200 MG tablet Commonly known as: ADVIL Take 200-400 mg by mouth every 6 (six) hours as needed for mild pain (pain score 1-3).   pantoprazole 40 MG tablet Commonly known as: PROTONIX Take 1 tablet (40 mg total) by mouth 2 (two) times daily before a meal.   rosuvastatin 10 MG tablet Commonly known as: CRESTOR Take 1 tablet (10 mg total) by mouth daily.               Durable Medical Equipment  (From admission, onward)           Start     Ordered   05/19/23 1416  For home use only DME Walker rolling  Once       Question Answer Comment  Walker: With 5 Inch Wheels   Patient needs a walker to treat with the following condition Weakness      05/19/23 1416            Follow-up Information     [employer]  [facility] Follow up on 05/26/2023.   Why: [facility] will pick you up at 9:40 am for 10 am appointment.  bring: current medications, insurance card and photo ID Contact information: [address] [place], [place] [address]  Phone: [phone]        Pasam, Archie Patten, MD Follow up in 1 week(s).   Specialty: Oncology Contact information: [address] [place] [address] [phone]                Allergies  Allergen Reactions   Sulfonamide Derivatives Hives and Itching    You were cared for by a hospitalist during your hospital stay. If you have any questions about your discharge medications or the care you received while you were in the hospital after you are discharged, you can call the unit and asked to speak with the hospitalist on call if the hospitalist that took care of you  is not available. Once you are discharged, your primary care physician will handle any further medical issues. Please note that no refills for any discharge medications will be authorized once you are discharged, as it is imperative that you return to your primary care physician (or establish a relationship with a primary care physician if you do not have one) for your aftercare needs so that they can reassess your need for medications and monitor your lab values.  You were cared for by a hospitalist during your hospital stay. If you have any questions about your discharge medications or the care you received while you were in the hospital after you are discharged, you can call the unit and asked to speak with the hospitalist on call if the hospitalist that took care of you is not available. Once you are discharged, your primary care physician will handle any further medical issues. Please note that NO REFILLS for any discharge medications will be authorized once you are discharged, as it is imperative that you return to your primary care physician (or establish a  relationship with a primary care physician if you do not have one) for your aftercare needs so that they can reassess your need for medications and monitor your lab values.  Please request your Prim.MD to go over all Hospital Tests and Procedure/Radiological results at the follow up, please get all Hospital records sent to your Prim MD by signing hospital release before you go home.  Get CBC, CMP, 2 view Chest X ray checked  by Primary MD during your next visit or SNF MD in 5-7 days ( we routinely change or add medications that can affect your baseline labs and fluid status, therefore we recommend that you get the mentioned basic workup next visit with your PCP, your PCP may decide not to get them or add new tests based on their clinical decision)  On your next visit with your primary care physician please Get Medicines reviewed and adjusted.  If you experience worsening of your admission symptoms, develop shortness of breath, life threatening emergency, suicidal or homicidal thoughts you must seek medical attention immediately by calling 911 or calling your MD immediately  if symptoms less severe.  You Must read complete instructions/literature along with all the possible adverse reactions/side effects for all the Medicines you take and that have been prescribed to you. Take any new Medicines after you have completely understood and accpet all the possible adverse reactions/side effects.   Do not drive, operate heavy machinery, perform activities at heights, swimming or participation in water activities or provide baby sitting services if your were admitted for syncope or siezures until you have seen by Primary MD or a Neurologist and advised to do so again.  Do not drive when taking Pain medications.   Procedures/Studies: DG Swallowing Func-Speech Pathology  Result Date: 05/24/2023 Table formatting from the original result was not included. Modified Barium Swallow Study Patient Details Name:  JERIC PAWELSKI MRN: [id number] Date of Birth: [birth year] Today's Date: 05/24/2023 HPI/PMH: HPI: Edilson Gan is a 62 yo male who presented to [facility] on 11/27 with lightheadedness, R ear pain, R ptosis, HA, cough, n/v. 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 CT NECK FINDINGS:  Pharynx and larynx: There is a large right nasopharyngeal mass that  extends inferiorly to the level the greater horn of the hyoid bone  on right. This mass involves the right-sided adenoid and palatine  tonsils, the right parapharyngeal space,  the right masticator space,  the right parotid space. Superiorly this mass likely extends to the  level of the foramen ovale bilaterally.  CT CHEST showed liver lesions concerning for mets; patulous esophagus. Results of biopsies pending. EGD 12/2: gastric ulcer, severe esophagitis, candidiasis. ENT and oncology following. PMH includes HIV on Hart therapy, HLD, depression, anxiety, AKI. Clinical Impression: Pt demonstrates mild oropharyngeal dysphagia characterized by mild residue in the vallecula and pyriform sinuses. There is appearance of mildly decreased hyoid excursion but very good laryngeal elevation. Pt may have decreased base of tongue retraction and decreased pharyngeal contraction that lead to pharyngeal residuals. The pt clears with an effortful swallow. Residue most significant with thin liquids. There was one instance of trace penetration post swallow with wet vocal quality and throat clear ejecting. Pt recommended to continue foods and drinks of choice. Suggested effortful swallows as a way to clear residue, but also to use as part of an initial home exercise program for pre radiation treatment (potential). Introduced concepts of radiation fibrosis, dysphagia and the importance of continueing swallowing. Pt not yet ready to absorb this and will need further education. Pt to f/u with OP SLP for further education and HEP. Factors  that may increase risk of adverse event in presence of aspiration Rubye Oaks & Clearance Coots 2021): No data recorded Recommendations/Plan: Swallowing Evaluation Recommendations Swallowing Evaluation Recommendations Recommendations: PO diet PO Diet Recommendation: Regular; Thin liquids (Level 0) Liquid Administration via: Cup; Straw Medication Administration: Whole meds with liquid Supervision: Patient able to self-feed Swallowing strategies  : effortful swallow Postural changes: Stay upright 30-60 min after meals Oral care recommendations: Oral care BID (2x/day) Treatment Plan Treatment Plan Treatment recommendations: Defer treatment plan to SLP at other venue (see follow-up recommendations) Follow-up recommendations: Outpatient SLP Recommendations Recommendations for follow up therapy are one component of a multi-disciplinary discharge planning process, led by the attending physician.  Recommendations may be updated based on patient status, additional functional criteria and insurance authorization. Assessment: Orofacial Exam: Orofacial Exam Oral Cavity - Dentition: Adequate natural dentition Anatomy: No data recorded Boluses Administered: Boluses Administered Boluses Administered: Thin liquids (Level 0); Mildly thick liquids (Level 2, nectar thick); Moderately thick liquids (Level 3, honey thick); Puree; Solid  Oral Impairment Domain: Oral Impairment Domain Lip Closure: No labial escape Tongue control during bolus hold: Cohesive bolus between tongue to palatal seal Bolus preparation/mastication: Timely and efficient chewing and mashing Bolus transport/lingual motion: Brisk tongue motion Oral residue: Complete oral clearance Location of oral residue : N/A Initiation of pharyngeal swallow : Posterior laryngeal surface of the epiglottis  Pharyngeal Impairment Domain: Pharyngeal Impairment Domain Soft palate elevation: No bolus between soft palate (SP)/pharyngeal wall (PW) Laryngeal elevation: Complete superior movement of  thyroid cartilage with complete approximation of arytenoids to epiglottic petiole Anterior hyoid excursion: Partial anterior movement Epiglottic movement: Complete inversion Laryngeal vestibule closure: Complete, no air/contrast in laryngeal vestibule Pharyngeal stripping wave : Present - diminished Pharyngeal contraction (A/P view only): N/A Pharyngoesophageal segment opening: Complete distension and complete duration, no obstruction of flow Tongue base retraction: Trace column of contrast or air between tongue base and PPW Pharyngeal residue: Trace residue within or on pharyngeal structures; Collection of residue within or on pharyngeal structures Location of pharyngeal residue: Valleculae; Pyriform sinuses  Esophageal Impairment Domain: Esophageal Impairment Domain Esophageal clearance upright position: Esophageal retention Pill: Pill Consistency administered: Thin liquids (Level 0) Penetration/Aspiration Scale Score: No data recorded Compensatory Strategies: Compensatory Strategies Compensatory strategies: Yes Effortful swallow: Effective Chin tuck: Ineffective   General Information:  Caregiver present: No  Diet Prior to this Study: Regular; Thin liquids (Level 0)   Temperature : Normal   Respiratory Status: WFL   Supplemental O2: None (Room air)   No data recorded Behavior/Cognition: Alert; Cooperative Self-Feeding Abilities: Able to self-feed Baseline vocal quality/speech: Dysphonic Volitional Cough: Able to elicit Volitional Swallow: Able to elicit No data recorded Goal Planning: No data recorded No data recorded No data recorded No data recorded No data recorded Pain: No data recorded End of Session: Start Time:SLP Start Time (ACUTE ONLY): 1115 Stop Time: SLP Stop Time (ACUTE ONLY): 1136 Time Calculation:SLP Time Calculation (min) (ACUTE ONLY): 21 min Charges: SLP Evaluations $ SLP Speech Visit: 1 Visit SLP Evaluations $BSS Swallow: 1 Procedure $MBS Swallow: 1 Procedure $Swallowing Treatment: 1 Procedure SLP  visit diagnosis: SLP Visit Diagnosis: Dysphagia, oropharyngeal phase (R13.12) Past Medical History: Past Medical History: Diagnosis Date  Anxiety   Cellulitis 02/11/2014  LEFT ARM  Depression   Headache(784.0)   HIV (human immunodeficiency virus infection) (HCC)  Past Surgical History: Past Surgical History: Procedure Laterality Date  ARM HARDWARE REMOVAL Left   arm surgery    HEMORRHOID SURGERY    NASOPHARYNGEAL BIOPSY N/A 05/19/2023  Procedure: NASOPHARYNGEAL BIOPSY;  Surgeon: Newman Pies, MD;  Location: [facility] OR;  Service: ENT;  Laterality: N/A; DeBlois, Riley Nearing 05/24/2023, 12:22 PM  CT ABDOMEN PELVIS W CONTRAST  Result Date: 05/23/2023 CLINICAL DATA:  Abnormal neck and chest CT, nasopharyngeal mass EXAM: CT ABDOMEN AND PELVIS WITH CONTRAST TECHNIQUE: Multidetector CT imaging of the abdomen and pelvis was performed using the standard protocol following bolus administration of intravenous contrast. RADIATION DOSE REDUCTION: This exam was performed according to the departmental dose-optimization program which includes automated exposure control, adjustment of the mA and/or kV according to patient size and/or use of iterative reconstruction technique. CONTRAST:  75mL OMNIPAQUE IOHEXOL 350 MG/ML SOLN COMPARISON:  CT 05/20/2023 FINDINGS: Lower chest: See separately dictated recently performed chest CT. Left lung base pulmonary nodule measuring 6 mm on series 4, image 21. No acute airspace disease. Hepatobiliary: Multiple hypodense liver masses concerning for metastatic disease. Index right hepatic lobe mass lesion measures 2.8 x 2.3 cm on series 3, image 22. Index left hepatic lobe lesion measures 2.4 x 2.6 cm on series 3, image 31. Numerous additional hypodense liver masses. No calcified gallstone or biliary dilatation. Pancreas: Unremarkable. No pancreatic ductal dilatation or surrounding inflammatory changes. Spleen: Normal in size without focal abnormality. Adrenals/Urinary Tract: Adrenal glands are normal.  Kidneys show no hydronephrosis. The bladder is unremarkable Stomach/Bowel: Stomach nonenlarged. Focal wall thickening of the mid gastric lumen, series 3, image 36 and coronal series 6, image 77. On sagittal views, ulcer crater at the lesser curvature of stomach, series 7, image 87 and 88. No small bowel distension. No acute bowel wall thickening. Vascular/Lymphatic: Nonaneurysmal aorta with mild atherosclerosis. Mildly enlarged gastrohepatic lymph nodes measuring 14 x 14 mm on series 3, image 27. Negative for retroperitoneal or pelvic adenopathy. Reproductive: Prostate is unremarkable. Other: Negative for pelvic effusion or free air for small fat containing inguinal hernias. Musculoskeletal: No acute or suspicious osseous abnormality. IMPRESSION: 1. Multiple hypodense liver masses concerning for metastatic disease. Mildly enlarged gastrohepatic lymph nodes also suspicious for metastatic disease. 2. Focal wall thickening of the mid gastric lumen with ulcer crater at the lesser curvature of stomach. Correlate with endoscopy if not already performed. 3. Left lung base pulmonary nodule measuring 6 mm. See separately dictated recently performed chest CT. 4. Aortic atherosclerosis. Aortic Atherosclerosis (ICD10-I70.0). Electronically  Signed   By: Jasmine Pang M.D.   On: 05/23/2023 17:52   US BIOPSY (LIVER)  Result Date: 05/23/2023 INDICATION: 62 year old with multiple liver lesions. Findings are suggestive for metastatic disease. Diagnosis is needed. EXAM: ULTRASOUND-GUIDED LIVER LESION BIOPSY MEDICATIONS: Moderate sedation ANESTHESIA/SEDATION: Moderate (conscious) sedation was employed during this procedure. A total of Versed 1 mg and Fentanyl 25 mcg was administered intravenously by the radiology nurse. Total intra-service moderate Sedation Time: 18 minutes. The patient's level of consciousness and vital signs were monitored continuously by radiology nursing throughout the procedure under my direct supervision.  FLUOROSCOPY TIME:  None COMPLICATIONS: None immediate. PROCEDURE: Informed written consent was obtained from the patient after a thorough discussion of the procedural risks, benefits and alternatives. All questions were addressed. Maximal Sterile Barrier Technique was utilized including caps, mask, sterile gowns, sterile gloves, sterile drape, hand hygiene and skin antiseptic. A timeout was performed prior to the initiation of the procedure. Abdomen was evaluated with ultrasound. A right hepatic lesion was targeted. The right side of the abdomen was prepped with chlorhexidine and sterile field was created. Skin was anesthetized using 1% lidocaine. Small incision was made. Using ultrasound guidance, 17 gauge coaxial needle was directed into a right hepatic lesion. Total of 3 core biopsies were performed with an 18 gauge core device. Three adequate specimens obtained and placed in formalin. 17 gauge coaxial needle was removed without complication. Bandage placed over the puncture site. FINDINGS: Several hyperechoic lesions scattered throughout the liver. A lesion in the right hepatic lobe was targeted. Biopsy needle confirmed within the lesion. Three adequate specimens obtained. No immediate bleeding or hematoma formation. IMPRESSION: Successful ultrasound-guided core biopsies from a right hepatic lesion. Electronically Signed   By: Richarda Overlie M.D.   On: 05/23/2023 10:22   CT CHEST W CONTRAST  Result Date: 05/20/2023 CLINICAL DATA:  Staging colon cancer. New nasopharyngeal mass. * Tracking Code: BO * EXAM: CT CHEST WITH CONTRAST TECHNIQUE: Multidetector CT imaging of the chest was performed during intravenous contrast administration. RADIATION DOSE REDUCTION: This exam was performed according to the departmental dose-optimization program which includes automated exposure control, adjustment of the mA and/or kV according to patient size and/or use of iterative reconstruction technique. CONTRAST:  50mL OMNIPAQUE  IOHEXOL 350 MG/ML SOLN COMPARISON:  None Available. FINDINGS: Cardiovascular: Heart is nonenlarged. Coronary artery calcifications are seen. Please correlate for other coronary risk factors. Diameter of the ascending aorta measures 4.4 by 4.4 cm at the level of the right pulmonary artery. More normal caliber arch and descending thoracic aorta. Mild vascular calcifications along the aorta. Mediastinum/Nodes: Preserved thyroid gland. Slightly patulous thoracic esophagus with some luminal air and fluid. No specific abnormal lymph node enlargement present in the axillary region, hilum or mediastinum. A few small less than 1 cm size mediastinal and right hilar nodes are seen, nonpathologic by size criteria. Lungs/Pleura: No consolidation, pneumothorax or effusion. Slight breathing motion. Left lower lobe lung nodule identified measuring 8 by 3 mm on series 4, image 136. Similar focus in the right lung base measuring 7 mm on series 4 image 123. Few other tiny nodules in the right lung such as series 4, image 119 in the lower lobe. Few patchy areas of ground-glass type areas are seen as well. Apical pleural thickening identified with some paraseptal and centrilobular emphysematous changes. Some areas of interstitial septal thickening as well. Upper Abdomen: Multiple liver masses are identified. At least 15 lesions are seen. Example in segment 2 on series 3, image 147 measures  2.7 by 2.6 cm. Middle lobe lesion on series 3, image 174 measures 2.7 by 2.7 cm. Adrenal glands are preserved. There also abnormal nodes identified such as lesser curve of the stomach measuring 16 x 20 mm. Musculoskeletal: Mild degenerative changes along the spine. IMPRESSION: Numerous liver metastases. Upper abdominal abnormal nodes. Further workup of the abdomen and pelvis may be useful when clinically appropriate if there is no known previous. Patulous esophagus with slight wall thickening. Please correlate with symptoms and additional evaluation  when clinically appropriate. Small mediastinal lymph nodes.  Attention on follow-up. Small lung nodules measuring up to 8 mm. Recommend follow up in 3 months or as per the patient's primary neoplasm. Coronary artery calcifications. In addition dilatation of the ascending aorta. Recommend annual imaging followup by CTA or MRA. This recommendation follows 2010 ACCF/AHA/AATS/ACR/ASA/SCA/SCAI/SIR/STS/SVM Guidelines for the Diagnosis and Management of Patients with Thoracic Aortic Disease. Circulation. 2010; 121: M841-L244. Aortic aneurysm NOS (ICD10-I71.9) Aortic Atherosclerosis (ICD10-I70.0) and Emphysema (ICD10-J43.9). Electronically Signed   By: Karen Kays M.D.   On: 05/20/2023 14:09   CT SOFT TISSUE NECK W CONTRAST  Result Date: 05/18/2023 CLINICAL DATA:  Neck mass EXAM: CT NECK WITH CONTRAST TECHNIQUE: Multidetector CT imaging of the neck was performed using the standard protocol following the bolus administration of intravenous contrast. RADIATION DOSE REDUCTION: This exam was performed according to the departmental dose-optimization program which includes automated exposure control, adjustment of the mA and/or kV according to patient size and/or use of iterative reconstruction technique. CONTRAST:  75mL OMNIPAQUE IOHEXOL 350 MG/ML SOLN COMPARISON:  CTA head/neck 05/17/23 FINDINGS: Pharynx and larynx: There is a large right nasopharyngeal mass that extends inferiorly to the level the greater horn of the hyoid bone on right. This mass involves the right-sided adenoid and palatine tonsils, the right parapharyngeal space, the right masticator space, the right parotid space. Superiorly this mass likely extends to the level of the foramen ovale bilaterally. Salivary glands: The lateral extension of the large nasopharyngeal mass abuts the right parotid gland in the superior aspect of the right submandibular gland. There is a 9 mm hypodense lesion in the left parotid gland. Thyroid: Normal. Lymph nodes: There is a  9 mm hypodense lesion in the left parotid gland (series 2, image 46). It is unclear if this represents a necrotic lymph node or an intraparotid cyst. Vascular: Negative. Limited intracranial: Negative. Visualized orbits: Negative. Mastoids and visualized paranasal sinuses: No middle ear or mastoid effusion. Paranasal sinuses are clear. Skeleton: No acute or aggressive process. Upper chest: Negative. Other: None IMPRESSION: 1. Large right nasopharyngeal mass that extends inferiorly to the level the greater horn of the hyoid bone on the right. This mass involves the right-sided adenoid and palatine tonsils, the right parapharyngeal space, the right masticator space, the right parotid space. Superiorly this mass likely extends to the level of the foramen ovale bilaterally. 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 Recommend ENT consultation. 2. A 9 mm hypodense lesion in the left parotid gland. It is unclear if this represents a necrotic lymph node or an intraparotid cyst. Electronically Signed   By: Lorenza Cambridge M.D.   On: 05/18/2023 11:31   MR ANGIO HEAD WO CONTRAST  Result Date: 05/17/2023 CLINICAL DATA:  Initial evaluation for persistent dizziness. EXAM: MRA HEAD WITHOUT CONTRAST TECHNIQUE: Angiographic images of the Circle of Willis were acquired using MRA technique without intravenous contrast. COMPARISON:  CTA from earlier the same day. FINDINGS: Anterior circulation: Both internal carotid arteries are widely patent through  the siphons without stenosis or other abnormality. Again seen is a 3 mm outpouching extending posteriorly and laterally from the left ICA terminus (series 2, image 93). Possible small vessel emanating from its apex, not entirely certain. Finding could reflect an vascular infundibulum versus aneurysm. A1 segments patent bilaterally, with the right dominant. Normal anterior communicating artery complex. Anterior cerebral arteries patent without stenosis. No M1  stenosis or occlusion. Distal MCA branches perfused and symmetric. Posterior circulation: Visualized V4 segments patent without stenosis. Right PICA patent. Left PICA origin not seen. Basilar patent without stenosis. Superior cerebellar and posterior cerebral arteries widely patent bilaterally. Anatomic variants: As above. Other: Previously identified mass at the right skull base noted, partially visualized, better evaluated on prior CT. IMPRESSION: 1. 3 mm outpouching extending posteriorly and laterally from the left ICA terminus, potentially reflecting an vascular infundibulum versus aneurysm. Attention at follow-up recommended. 2. Otherwise normal intracranial MRA. No large vessel occlusion or other emergent finding. Electronically Signed   By: Rise Mu M.D.   On: 05/17/2023 20:58   CT ANGIO HEAD NECK W WO CM  Result Date: 05/17/2023 CLINICAL DATA:  Cerebral vasospasm suspected EXAM: CT ANGIOGRAPHY HEAD AND NECK WITH AND WITHOUT CONTRAST TECHNIQUE: Multidetector CT imaging of the head and neck was performed using the standard protocol during bolus administration of intravenous contrast. Multiplanar CT image reconstructions and MIPs were obtained to evaluate the vascular anatomy. Carotid stenosis measurements (when applicable) are obtained utilizing NASCET criteria, using the distal internal carotid diameter as the denominator. RADIATION DOSE REDUCTION: This exam was performed according to the departmental dose-optimization program which includes automated exposure control, adjustment of the mA and/or kV according to patient size and/or use of iterative reconstruction technique. CONTRAST:  75mL OMNIPAQUE IOHEXOL 350 MG/ML SOLN COMPARISON:  None Available. FINDINGS: CT HEAD FINDINGS Brain: No evidence of acute infarction, hemorrhage, hydrocephalus, extra-axial collection or intraparenchymal mass lesion/mass effect. Vascular: See below. Skull: No acute fracture. Sinuses/Orbits: Clear sinuses.  No  acute orbital findings. Other: No mastoid effusions. Review of the MIP images confirms the above findings CTA NECK FINDINGS Aortic arch: Great vessel origins are patent without significant stenosis. Right carotid system: No evidence of dissection, stenosis (50% or greater), or occlusion. Left carotid system: No evidence of dissection, stenosis (50% or greater), or occlusion. Mild atherosclerosis at the carotid bifurcation. Vertebral arteries: Codominant. No evidence of dissection, stenosis (50% or greater), or occlusion. Skeleton: No acute abnormality on limited assessment. 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 Review of the MIP images confirms the above findings CTA HEAD FINDINGS Anterior circulation: Bilateral intracranial ICAs, MCAs, and ACAs are patent without proximal hemodynamically significant stenosis. Hypoplastic left A1 ACA, likely congenital. Approximately 2 mm posteriorly inferiorly directed outpouching arising from the left carotid terminus (for example see series 605, image 111 and series 604, image 154 Posterior circulation: Tortuous vertebral arteries. The vertebral arteries, basilar artery and bilateral posterior cerebral arteries are patent without proximal hemodynamically significant stenosis. Venous sinuses: As permitted by contrast timing, patent. Anatomic variants: Described above. Review of the MIP images confirms the above findings IMPRESSION: 1. 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 Recommend ENT consultation, correlation with direct inspection, and follow-up dedicated CT of the neck with contrast for better characterization. 2. No emergent large vessel occlusion or proximal hemodynamically significant stenosis. 3. Approximately 2 mm posteriorly inferiorly directed outpouching arising from the  left carotid  terminus which could represent an infundibulum with vessel not well seen versus aneurysm. 4. Aortic Atherosclerosis (ICD10-I70.0) and Emphysema (ICD10-J43.9). Electronically Signed   By: Feliberto Harts M.D.   On: 05/17/2023 14:55   DG Chest Port 1 View  Result Date: 05/17/2023 CLINICAL DATA:  Worsening cough and dry heaving with shortness of breath EXAM: PORTABLE CHEST 1 VIEW COMPARISON:  Chest radiograph dated 05/11/2023 FINDINGS: Normal lung volumes. No focal consolidations. No pleural effusion or pneumothorax. The heart size and mediastinal contours are within normal limits. No acute osseous abnormality. IMPRESSION: No active disease. Electronically Signed   By: Agustin Cree M.D.   On: 05/17/2023 14:04   ECHOCARDIOGRAM COMPLETE  Result Date: 05/12/2023    ECHOCARDIOGRAM REPORT   Patient Name:   ELBERT CHEVERE Kleppe Date of Exam: 05/12/2023 Medical Rec #:  [id number]       Height:       69.0 in Accession #:    [id number]      Weight:       154.6 lb Date of Birth:  March 09, 1961       BSA:          1.852 m Patient Age:    62 years        BP:           124/67 mmHg Patient Gender: M               HR:           73 bpm. Exam Location:  Inpatient Procedure: 2D Echo, Color Doppler and Cardiac Doppler Indications:    Syncope  History:        Patient has no prior history of Echocardiogram examinations.                 Signs/Symptoms:Syncope; Risk Factors:HIV, Polysubstance Abuse                 and Former Smoker.  Sonographer:    Milbert Coulter Referring Phys: [id number] JARED M GARDNER IMPRESSIONS  1. Left ventricular ejection fraction, by estimation, is 60 to 65%. The left ventricle has normal function. The left ventricle has no regional wall motion abnormalities. Left ventricular diastolic parameters were normal.  2. Right ventricular systolic function is normal. The right ventricular size is normal. There is normal pulmonary artery systolic pressure.  3. The mitral valve is normal in structure. No evidence of  mitral valve regurgitation. No evidence of mitral stenosis.  4. The aortic valve is tricuspid. There is mild calcification of the aortic valve. There is mild thickening of the aortic valve. Aortic valve regurgitation is not visualized. Aortic valve sclerosis/calcification is present, without any evidence of aortic stenosis.  5. Aortic dilatation noted. There is mild dilatation of the ascending aorta, measuring 40 mm.  6. The inferior vena cava is normal in size with greater than 50% respiratory variability, suggesting right atrial pressure of 3 mmHg. Comparison(s): No prior Echocardiogram. Conclusion(s)/Recommendation(s): Otherwise normal echocardiogram, with minor abnormalities described in the report. Mild dilation of ascending aorta. FINDINGS  Left Ventricle: Left ventricular ejection fraction, by estimation, is 60 to 65%. The left ventricle has normal function. The left ventricle has no regional wall motion abnormalities. The left ventricular internal cavity size was normal in size. There is  no left ventricular hypertrophy. Left ventricular diastolic parameters were normal. Right Ventricle: The right ventricular size is normal. No increase in right ventricular wall thickness. Right ventricular systolic function is normal. There is normal pulmonary artery systolic pressure.  The tricuspid regurgitant velocity is 2.54 m/s, and  with an assumed right atrial pressure of 3 mmHg, the estimated right ventricular systolic pressure is 28.8 mmHg. Left Atrium: Left atrial size was normal in size. Right Atrium: Right atrial size was normal in size. Prominent Chiari network. Pericardium: There is no evidence of pericardial effusion. Mitral Valve: The mitral valve is normal in structure. No evidence of mitral valve regurgitation. No evidence of mitral valve stenosis. Tricuspid Valve: The tricuspid valve is normal in structure. Tricuspid valve regurgitation is mild . No evidence of tricuspid stenosis. Aortic Valve: The aortic  valve is tricuspid. There is mild calcification of the aortic valve. There is mild thickening of the aortic valve. Aortic valve regurgitation is not visualized. Aortic valve sclerosis/calcification is present, without any evidence of aortic stenosis. Aortic valve mean gradient measures 4.0 mmHg. Aortic valve peak gradient measures 8.3 mmHg. Aortic valve area, by VTI measures 4.09 cm. Pulmonic Valve: The pulmonic valve was grossly normal. Pulmonic valve regurgitation is trivial. No evidence of pulmonic stenosis. Aorta: Aortic dilatation noted. There is mild dilatation of the ascending aorta, measuring 40 mm. Venous: The inferior vena cava is normal in size with greater than 50% respiratory variability, suggesting right atrial pressure of 3 mmHg. IAS/Shunts: No atrial level shunt detected by color flow Doppler.  LEFT VENTRICLE PLAX 2D LVIDd:         4.60 cm   Diastology LVIDs:         3.10 cm   LV e' medial:    7.18 cm/s LV PW:         1.10 cm   LV E/e' medial:  6.4 LV IVS:        1.20 cm   LV e' lateral:   16.00 cm/s LVOT diam:     2.40 cm   LV E/e' lateral: 2.8 LV SV:         94 LV SV Index:   51 LVOT Area:     4.52 cm  RIGHT VENTRICLE RV Basal diam:  3.00 cm RV Mid diam:    2.80 cm RV S prime:     13.60 cm/s TAPSE (M-mode): 2.0 cm LEFT ATRIUM             Index        RIGHT ATRIUM           Index LA diam:        3.80 cm 2.05 cm/m   RA Area:     13.10 cm LA Vol (A2C):   34.3 ml 18.52 ml/m  RA Volume:   29.50 ml  15.93 ml/m LA Vol (A4C):   45.1 ml 24.35 ml/m LA Biplane Vol: 40.7 ml 21.98 ml/m  AORTIC VALVE AV Area (Vmax):    3.96 cm AV Area (Vmean):   3.80 cm AV Area (VTI):     4.09 cm AV Vmax:           144.00 cm/s AV Vmean:          94.700 cm/s AV VTI:            0.230 m AV Peak Grad:      8.3 mmHg AV Mean Grad:      4.0 mmHg LVOT Vmax:         126.00 cm/s LVOT Vmean:        79.600 cm/s LVOT VTI:          0.208 m LVOT/AV VTI ratio: 0.90  AORTA Ao Root diam:  4.00 cm Ao Asc diam:  4.00 cm MITRAL VALVE                TRICUSPID VALVE MV Area (PHT): 2.33 cm    TR Peak grad:   25.8 mmHg MV Decel Time: 325 msec    TR Vmax:        254.00 cm/s MV E velocity: 45.60 cm/s MV A velocity: 81.20 cm/s  SHUNTS MV E/A ratio:  0.56        Systemic VTI:  0.21 m                            Systemic Diam: 2.40 cm Jodelle Red MD Electronically signed by Jodelle Red MD Signature Date/Time: 05/12/2023/5:59:52 PM    Final    DG Chest Portable 1 View  Result Date: 05/11/2023 CLINICAL DATA:  Cough. EXAM: PORTABLE CHEST 1 VIEW COMPARISON:  07/01/2018. FINDINGS: Bilateral lung fields are clear. No consolidation or lung collapse. No pulmonary edema. Bilateral costophrenic angles are clear. Normal cardio-mediastinal silhouette. No acute osseous abnormalities. The soft tissues are within normal limits. IMPRESSION: *No active disease. Electronically Signed   By: Jules Schick M.D.   On: 05/11/2023 13:47   CT Head Wo Contrast  Result Date: 05/11/2023 CLINICAL DATA:  Headache, new onset (Age >= 51y) EXAM: CT HEAD WITHOUT CONTRAST TECHNIQUE: Contiguous axial images were obtained from the base of the skull through the vertex without intravenous contrast. RADIATION DOSE REDUCTION: This exam was performed according to the departmental dose-optimization program which includes automated exposure control, adjustment of the mA and/or kV according to patient size and/or use of iterative reconstruction technique. COMPARISON:  None Available. FINDINGS: Brain: No evidence of acute infarction, hemorrhage, hydrocephalus, extra-axial collection or mass lesion/mass effect. Vascular: No hyperdense vessel or unexpected calcification. Skull: Normal. Negative for fracture or focal lesion. Sinuses/Orbits: No middle ear or mastoid effusion. Paranasal sinuses are notable for mucosal thickening of the floor of the right maxillary sinus. Orbits are unremarkable. Other: None. IMPRESSION: No acute intracranial abnormality. Electronically Signed    By: Lorenza Cambridge M.D.   On: 05/11/2023 12:33     The results of significant diagnostics from this hospitalization (including imaging, microbiology, ancillary and laboratory) are listed below for reference.     Microbiology: Recent Results (from the past 240 hour(s))  Blood culture (routine x 2)     Status: None   Collection Time: 05/17/23 12:13 PM   Specimen: BLOOD  Result Value Ref Range Status   Specimen Description   Final    BLOOD LEFT ANTECUBITAL Performed at [facility], [address] [place], [place] [address]    Special Requests   Final    BOTTLES DRAWN AEROBIC AND ANAEROBIC Blood Culture adequate volume Performed at [facility], [address] [place], [place] [address]    Culture   Final    NO GROWTH 5 DAYS Performed at [facility], [address] [place], [place] [address]    Report Status 05/22/2023 FINAL  Final  Blood culture (routine x 2)     Status: None   Collection Time: 05/17/23 12:18 PM   Specimen: BLOOD RIGHT HAND  Result Value Ref Range Status   Specimen Description   Final    BLOOD RIGHT HAND Performed at [facility], [address] [place], [place] [address]    Special Requests   Final    BOTTLES  DRAWN AEROBIC AND ANAEROBIC Blood Culture adequate volume Performed at [facility], [address] [place], [place] [address]    Culture   Final    NO GROWTH 5 DAYS Performed at [facility], [address], [place], [place] [address]    Report Status 05/22/2023 FINAL  Final  Surgical pcr screen     Status: None   Collection Time: 05/18/23  8:38 PM   Specimen: Nasal Mucosa; Nasal Swab  Result Value Ref Range Status   MRSA, PCR NEGATIVE NEGATIVE Final   Staphylococcus aureus NEGATIVE NEGATIVE Final    Comment: (NOTE) The Xpert SA Assay (FDA approved for NASAL specimens in patients 73 years of age and older), is one component of a comprehensive surveillance program. It is not intended to  diagnose infection nor to guide or monitor treatment. Performed at [facility], [address] [place], [place] [address]      Labs: BNP (last 3 results) No results for input(s): "BNP" in the last 8760 hours. Basic Metabolic Panel: Recent Labs  Lab 05/17/23 1741 05/18/23 0452 05/22/23 0709 05/23/23 0524 05/24/23 0537  NA  --  132* 136 133* 133*  K  --  4.1 4.7 4.0 4.7  CL  --  104 104 101 101  CO2  --  22 25 24 25   GLUCOSE  --  103* 104* 141* 102*  BUN  --  17 26* 25* 23  CREATININE 1.15 1.08 1.11 1.32* 1.24  CALCIUM  --  8.4* 8.8* 8.6* 8.7*   Liver Function Tests: Recent Labs  Lab 05/18/23 0452  AST 24  ALT 26  ALKPHOS 87  BILITOT 0.5  PROT 7.0  ALBUMIN 3.1*   No results for input(s): "LIPASE", "AMYLASE" in the last 168 hours. No results for input(s): "AMMONIA" in the last 168 hours. CBC: Recent Labs  Lab 05/17/23 1741 05/18/23 0452 05/22/23 0709 05/23/23 0524 05/24/23 0537  WBC 9.6 6.9 10.5 8.3 7.6  HGB 9.8* 9.1* 9.4* 9.3* 10.2*  HCT 28.9* 27.2* 28.9* 29.5* 31.5*  MCV 95.1 96.5 97.6 98.3 98.7  PLT 250 215 285 259 276   Cardiac Enzymes: No results for input(s): "CKTOTAL", "CKMB", "CKMBINDEX", "TROPONINI" in the last 168 hours. BNP: Invalid input(s): "POCBNP" CBG: No results for input(s): "GLUCAP" in the last 168 hours. D-Dimer No results for input(s): "DDIMER" in the last 72 hours. Hgb A1c No results for input(s): "HGBA1C" in the last 72 hours. Lipid Profile No results for input(s): "CHOL", "HDL", "LDLCALC", "TRIG", "CHOLHDL", "LDLDIRECT" in the last 72 hours. Thyroid function studies No results for input(s): "TSH", "T4TOTAL", "T3FREE", "THYROIDAB" in the last 72 hours.  Invalid input(s): "FREET3" Anemia work up No results for input(s): "VITAMINB12", "FOLATE", "FERRITIN", "TIBC", "IRON", "RETICCTPCT" in the last 72 hours. Urinalysis    Component Value Date/Time   COLORURINE YELLOW 07/01/2018 1032   APPEARANCEUR CLEAR 07/01/2018 1032    LABSPEC 1.010 07/01/2018 1032   PHURINE 6.5 07/01/2018 1032   GLUCOSEU NEGATIVE 07/01/2018 1032   HGBUR NEGATIVE 07/01/2018 1032   BILIRUBINUR NEGATIVE 07/01/2018 1032   KETONESUR NEGATIVE 07/01/2018 1032   PROTEINUR NEGATIVE 07/01/2018 1032   UROBILINOGEN 0.2 02/10/2014 1830   NITRITE NEGATIVE 07/01/2018 1032   LEUKOCYTESUR NEGATIVE 07/01/2018 1032   Sepsis Labs Recent Labs  Lab 05/18/23 0452 05/22/23 0709 05/23/23 0524 05/24/23 0537  WBC 6.9 10.5 8.3 7.6   Microbiology Recent Results (from the past 240 hour(s))  Blood culture (routine x 2)  Status: None   Collection Time: 05/17/23 12:13 PM   Specimen: BLOOD  Result Value Ref Range Status   Specimen Description   Final    BLOOD LEFT ANTECUBITAL Performed at [facility], [address] [place], [place] [address]    Special Requests   Final    BOTTLES DRAWN AEROBIC AND ANAEROBIC Blood Culture adequate volume Performed at [facility], [address] [place], [place] [address]    Culture   Final    NO GROWTH 5 DAYS Performed at [facility], [address], [place], [place] [address]    Report Status 05/22/2023 FINAL  Final  Blood culture (routine x 2)     Status: None   Collection Time: 05/17/23 12:18 PM   Specimen: BLOOD RIGHT HAND  Result Value Ref Range Status   Specimen Description   Final    BLOOD RIGHT HAND Performed at [facility], [address], [place], [place] [address]    Special Requests   Final    BOTTLES DRAWN AEROBIC AND ANAEROBIC Blood Culture adequate volume Performed at [facility], [address] [place], [place] [address]    Culture   Final    NO GROWTH 5 DAYS Performed at [facility], [address] [place], [place] [address]    Report Status 05/22/2023 FINAL  Final  Surgical pcr screen     Status: None   Collection Time: 05/18/23  8:38 PM   Specimen: Nasal Mucosa; Nasal Swab  Result Value Ref Range Status   MRSA, PCR  NEGATIVE NEGATIVE Final   Staphylococcus aureus NEGATIVE NEGATIVE Final    Comment: (NOTE) The Xpert SA Assay (FDA approved for NASAL specimens in patients 87 years of age and older), is one component of a comprehensive surveillance program. It is not intended to diagnose infection nor to guide or monitor treatment. Performed at [facility], [address] [place], [place] [address]      Time coordinating discharge:  I have spent 35 minutes face to face with the patient and on the ward discussing the patients care, assessment, plan and disposition with other care givers. >50% of the time was devoted counseling the patient about the risks and benefits of treatment/Discharge disposition and coordinating care.   SIGNED:   Miguel Rota, MD  [facility] 05/24/2023, 12:49 PM   If 7PM-7AM, please contact night-coverage

## 2023-05-24 NOTE — Progress Notes (Signed)
Modified Barium Swallow Study  Patient Details  Name: Jordan Calderon MRN: [id number] Date of Birth: 01-11-1961  Today's Date: 05/24/2023  Modified Barium Swallow completed.  Full report located under Chart Review in the Imaging Section.  History of Present Illness Jordan Calderon is a 62 yo male who presented to [facility] on 11/27 with lightheadedness, R ear pain, R ptosis, HA, cough, n/v. 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 CT NECK FINDINGS:  Pharynx and larynx: There is a large right nasopharyngeal mass that  extends inferiorly to the level the greater horn of the hyoid bone  on right. This mass involves the right-sided adenoid and palatine  tonsils, the right parapharyngeal space, the right masticator space,  the right parotid space. Superiorly this mass likely extends to the  level of the foramen ovale bilaterally.  CT CHEST showed liver lesions concerning for mets; patulous esophagus. Results of biopsies pending. EGD 12/2: gastric ulcer, severe esophagitis, candidiasis. ENT and oncology following. PMH includes HIV on Hart therapy, HLD, depression, anxiety, AKI.   Clinical Impression Pt demonstrates mild oropharyngeal dysphagia characterized by mild residue in the vallecula and pyriform sinuses. There is appearance of mildly decreased hyoid excursion but very good laryngeal elevation. Pt may have decreased base of tongue retraction and decreased pharyngeal contraction that lead to pharyngeal residuals. The pt clears with an effortful swallow. Residue most significant with thin liquids. There was one instance of trace penetration post swallow with wet vocal quality and throat clear ejecting. Pt recommended to continue foods and drinks of choice. Suggested effortful swallows as a way to clear residue, but also to use as part of an initial home exercise program for pre radiation treatment (potential). Introduced concepts of radiation  fibrosis, dysphagia and the importance of continueing swallowing. Pt not yet ready to absorb this and will need further education. Pt to f/u with OP SLP for further education and HEP. Factors that may increase risk of adverse event in presence of aspiration Jordan Calderon 2021):    Swallow Evaluation Recommendations Recommendations: PO diet PO Diet Recommendation: Regular;Thin liquids (Level 0) Liquid Administration via: Cup;Straw Medication Administration: Whole meds with liquid Supervision: Patient able to self-feed Swallowing strategies  : effortful swallow Postural changes: Stay upright 30-60 min after meals Oral care recommendations: Oral care BID (2x/day)      Jordan Calderon, Riley Nearing 05/24/2023,12:14 PM

## 2023-05-25 ENCOUNTER — Encounter (HOSPITAL_COMMUNITY): Payer: Self-pay | Admitting: Gastroenterology

## 2023-05-25 ENCOUNTER — Telehealth: Payer: Self-pay

## 2023-05-25 NOTE — Telephone Encounter (Signed)
Patient sister [name] called with numerous concerns regarding patient most recent hospitalization and asked if Dr.Comer could review notes from hospital. [name] is concerned Symtuza is causing electrolyte balance and would like to know if patient can be put back on Prezcobix and Descovy. [name] also read that if a patient has a hx of hep B that they shouldn't be on Symtuza and a lot of the patient issues didn't start until changing medication.   Also Dapsone is causing a yeast infection. Patient has been referred to a PCP.   Informed [name] it might be best to schedule a virtual or office appointment to discuss multiple concerns but will route to Dr.Comer regarding changing medication back to previous meds.  Makiah Clauson Lesli Albee, CMA

## 2023-05-26 LAB — SURGICAL PATHOLOGY

## 2023-05-29 ENCOUNTER — Encounter (HOSPITAL_COMMUNITY): Payer: Self-pay

## 2023-05-29 ENCOUNTER — Observation Stay (HOSPITAL_COMMUNITY)
Admission: EM | Admit: 2023-05-29 | Discharge: 2023-05-30 | Disposition: A | Discharge status: Home / Self Care | Payer: Medicare Other | Attending: Family Medicine | Admitting: Family Medicine

## 2023-05-29 ENCOUNTER — Emergency Department (HOSPITAL_COMMUNITY): Payer: Medicare Other

## 2023-05-29 ENCOUNTER — Other Ambulatory Visit: Payer: Self-pay

## 2023-05-29 DIAGNOSIS — R0602 Shortness of breath: Secondary | ICD-10-CM | POA: Diagnosis present

## 2023-05-29 DIAGNOSIS — Z79899 Other long term (current) drug therapy: Secondary | ICD-10-CM | POA: Diagnosis not present

## 2023-05-29 DIAGNOSIS — J189 Pneumonia, unspecified organism: Principal | ICD-10-CM | POA: Diagnosis present

## 2023-05-29 DIAGNOSIS — B20 Human immunodeficiency virus [HIV] disease: Secondary | ICD-10-CM | POA: Diagnosis not present

## 2023-05-29 DIAGNOSIS — F411 Generalized anxiety disorder: Secondary | ICD-10-CM | POA: Diagnosis present

## 2023-05-29 DIAGNOSIS — Z1152 Encounter for screening for COVID-19: Secondary | ICD-10-CM | POA: Insufficient documentation

## 2023-05-29 DIAGNOSIS — Z87891 Personal history of nicotine dependence: Secondary | ICD-10-CM | POA: Diagnosis not present

## 2023-05-29 DIAGNOSIS — F1910 Other psychoactive substance abuse, uncomplicated: Secondary | ICD-10-CM | POA: Diagnosis present

## 2023-05-29 DIAGNOSIS — C801 Malignant (primary) neoplasm, unspecified: Secondary | ICD-10-CM

## 2023-05-29 LAB — COMPREHENSIVE METABOLIC PANEL WITH GFR
ALT: 32 U/L (ref 0–44)
AST: 24 U/L (ref 15–41)
Albumin: 3.4 g/dL — ABNORMAL LOW (ref 3.5–5.0)
Alkaline Phosphatase: 90 U/L (ref 38–126)
Anion gap: 6 (ref 5–15)
BUN: 23 mg/dL (ref 8–23)
CO2: 22 mmol/L (ref 22–32)
Calcium: 8.5 mg/dL — ABNORMAL LOW (ref 8.9–10.3)
Chloride: 105 mmol/L (ref 98–111)
Creatinine, Ser: 1.06 mg/dL (ref 0.61–1.24)
GFR, Estimated: >60 mL/min
Glucose, Bld: 97 mg/dL (ref 70–99)
Potassium: 4 mmol/L (ref 3.5–5.1)
Sodium: 133 mmol/L — ABNORMAL LOW (ref 135–145)
Total Bilirubin: 0.3 mg/dL
Total Protein: 7.6 g/dL (ref 6.5–8.1)

## 2023-05-29 LAB — BRAIN NATRIURETIC PEPTIDE: B Natriuretic Peptide: 57.9 pg/mL (ref 0.0–100.0)

## 2023-05-29 LAB — TROPONIN I (HIGH SENSITIVITY)
Troponin I (High Sensitivity): 14 ng/L
Troponin I (High Sensitivity): 12 ng/L

## 2023-05-29 LAB — CBC WITH DIFFERENTIAL/PLATELET
Abs Immature Granulocytes: 0.05 K/uL (ref 0.00–0.07)
Basophils Absolute: 0.1 K/uL (ref 0.0–0.1)
Basophils Relative: 2 %
Eosinophils Absolute: 0.2 K/uL (ref 0.0–0.5)
Eosinophils Relative: 4 %
HCT: 31 % — ABNORMAL LOW (ref 39.0–52.0)
Hemoglobin: 9.6 g/dL — ABNORMAL LOW (ref 13.0–17.0)
Immature Granulocytes: 1 %
Lymphocytes Relative: 21 %
Lymphs Abs: 1.2 K/uL (ref 0.7–4.0)
MCH: 31.8 pg (ref 26.0–34.0)
MCHC: 31 g/dL (ref 30.0–36.0)
MCV: 102.6 fL — ABNORMAL HIGH (ref 80.0–100.0)
Monocytes Absolute: 0.9 K/uL (ref 0.1–1.0)
Monocytes Relative: 15 %
Neutro Abs: 3.4 K/uL (ref 1.7–7.7)
Neutrophils Relative %: 57 %
Platelets: 203 K/uL (ref 150–400)
RBC: 3.02 MIL/uL — ABNORMAL LOW (ref 4.22–5.81)
RDW: 14.3 % (ref 11.5–15.5)
WBC: 5.8 K/uL (ref 4.0–10.5)
nRBC: 0 % (ref 0.0–0.2)

## 2023-05-29 LAB — RESP PANEL BY RT-PCR (RSV, FLU A&B, COVID)  RVPGX2
Influenza A by PCR: NEGATIVE
Influenza B by PCR: NEGATIVE
Resp Syncytial Virus by PCR: NEGATIVE
SARS Coronavirus 2 by RT PCR: NEGATIVE

## 2023-05-29 MED ORDER — ENOXAPARIN SODIUM 40 MG/0.4ML IJ SOSY
40.0000 mg | PREFILLED_SYRINGE | INTRAMUSCULAR | Status: DC
  Administered 2023-05-30: 40 mg via SUBCUTANEOUS
  Filled 2023-05-29: qty 0.4

## 2023-05-29 MED ORDER — ONDANSETRON HCL 4 MG/2ML IJ SOLN: 4.0000 mg | Freq: Four times a day (QID) | INTRAMUSCULAR | Status: DC | PRN

## 2023-05-29 MED ORDER — ONDANSETRON HCL 4 MG PO TABS: 4.0000 mg | ORAL_TABLET | Freq: Four times a day (QID) | ORAL | Status: DC | PRN

## 2023-05-29 MED ORDER — KETOROLAC TROMETHAMINE 30 MG/ML IJ SOLN
30.0000 mg | Freq: Once | INTRAMUSCULAR | Status: AC
  Administered 2023-05-29: 30 mg via INTRAVENOUS
  Filled 2023-05-29: qty 1

## 2023-05-29 MED ORDER — FLUCONAZOLE 200 MG PO TABS
200.0000 mg | ORAL_TABLET | Freq: Every day | ORAL | Status: DC
  Administered 2023-05-29 – 2023-05-30 (×2): 200 mg via ORAL
  Filled 2023-05-29 (×2): qty 1

## 2023-05-29 MED ORDER — SODIUM CHLORIDE 0.9 % IV SOLN
2.0000 g | INTRAVENOUS | Status: AC
  Administered 2023-05-29: 2 g via INTRAVENOUS
  Filled 2023-05-29: qty 12.5

## 2023-05-29 MED ORDER — ROSUVASTATIN CALCIUM 10 MG PO TABS
10.0000 mg | ORAL_TABLET | Freq: Every day | ORAL | Status: DC
  Administered 2023-05-29 – 2023-05-30 (×2): 10 mg via ORAL
  Filled 2023-05-29 (×2): qty 1
  Filled 2023-05-29: qty 0.5

## 2023-05-29 MED ORDER — TRAZODONE HCL 50 MG PO TABS
25.0000 mg | ORAL_TABLET | Freq: Every evening | ORAL | Status: DC | PRN
  Administered 2023-05-29: 25 mg via ORAL
  Filled 2023-05-29: qty 1

## 2023-05-29 MED ORDER — DARUN-COBIC-EMTRICIT-TENOFAF 800-150-200-10 MG PO TABS
1.0000 | ORAL_TABLET | Freq: Every day | ORAL | Status: DC
  Administered 2023-05-29 – 2023-05-30 (×2): 1 via ORAL
  Filled 2023-05-29 (×2): qty 1

## 2023-05-29 MED ORDER — DAPSONE 100 MG PO TABS
100.0000 mg | ORAL_TABLET | Freq: Every day | ORAL | Status: DC
  Administered 2023-05-30: 100 mg via ORAL
  Filled 2023-05-29 (×2): qty 1

## 2023-05-29 MED ORDER — ACETAMINOPHEN 650 MG RE SUPP: 650.0000 mg | Freq: Four times a day (QID) | RECTAL | Status: DC | PRN

## 2023-05-29 MED ORDER — VANCOMYCIN HCL IN DEXTROSE 1-5 GM/200ML-% IV SOLN
1000.0000 mg | Freq: Once | INTRAVENOUS | Status: AC
  Administered 2023-05-29: 1000 mg via INTRAVENOUS
  Filled 2023-05-29: qty 200

## 2023-05-29 MED ORDER — ALBUTEROL SULFATE (2.5 MG/3ML) 0.083% IN NEBU: 2.5000 mg | INHALATION_SOLUTION | RESPIRATORY_TRACT | Status: DC | PRN

## 2023-05-29 MED ORDER — SODIUM CHLORIDE 0.9 % IV SOLN
2.0000 g | Freq: Three times a day (TID) | INTRAVENOUS | Status: DC
  Administered 2023-05-29 – 2023-05-30 (×4): 2 g via INTRAVENOUS
  Filled 2023-05-29 (×4): qty 12.5

## 2023-05-29 MED ORDER — ACETAMINOPHEN 325 MG PO TABS
650.0000 mg | ORAL_TABLET | Freq: Four times a day (QID) | ORAL | Status: DC | PRN
  Administered 2023-05-29 – 2023-05-30 (×3): 650 mg via ORAL
  Filled 2023-05-29 (×3): qty 2

## 2023-05-29 MED ORDER — IOHEXOL 350 MG/ML SOLN
75.0000 mL | Freq: Once | INTRAVENOUS | Status: AC | PRN
  Administered 2023-05-29: 75 mL via INTRAVENOUS

## 2023-05-29 MED ORDER — PANTOPRAZOLE SODIUM 40 MG PO TBEC
40.0000 mg | DELAYED_RELEASE_TABLET | Freq: Two times a day (BID) | ORAL | Status: DC
  Administered 2023-05-29 – 2023-05-30 (×3): 40 mg via ORAL
  Filled 2023-05-29 (×3): qty 1

## 2023-05-29 NOTE — H&P (Addendum)
History and Physical  Jordan Calderon WGN:[id number] DOB: 01-04-1961 DOA: 05/29/2023  PCP: Pcp, No   Chief Complaint: shortness of breath   HPI: Jordan Calderon is a 62 y.o. 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  Patient states that he was doing well overall since he was discharged home on 12/4, has been eating well, ambulating on his own and has no specific complaints.  However, last night when he went to bed he got very short of breath, felt like he could not catch his breath.  Had a little bit of a cough but it was nonproductive.  Denies any chest pain, fevers, chills, nausea or vomiting.  On evaluation in the emergency department, he is stable on room air, lab work is nonacute.  CT scan of the chest shows evidence of bronchopneumonia, he was given empiric IV vancomycin and IV cefepime and now admitted to the hospitalist service.  Review of Systems: Please see HPI for pertinent positives and negatives. A complete 10 system review of systems are otherwise negative.  Past Medical History:  Diagnosis Date   Anxiety    Cellulitis 02/11/2014   LEFT ARM   Depression    Headache(784.0)    HIV (human immunodeficiency virus infection) (HCC)    Past Surgical History:  Procedure Laterality Date   ARM HARDWARE REMOVAL Left    arm surgery     BIOPSY  05/22/2023   Procedure: BIOPSY;  Surgeon: Benancio Deeds, MD;  Location: [facility] ENDOSCOPY;  Service: Gastroenterology;;   ESOPHAGOGASTRODUODENOSCOPY (EGD) WITH PROPOFOL N/A 05/22/2023   Procedure: ESOPHAGOGASTRODUODENOSCOPY (EGD) WITH PROPOFOL;  Surgeon: Benancio Deeds, MD;  Location: [facility] ENDOSCOPY;  Service: Gastroenterology;  Laterality: N/A;    HEMORRHOID SURGERY     NASOPHARYNGEAL BIOPSY N/A 05/19/2023   Procedure: NASOPHARYNGEAL BIOPSY;  Surgeon: Newman Pies, MD;  Location: [facility] OR;  Service: ENT;  Laterality: N/A;    Social History:  reports that he quit smoking about 8 years ago. His smoking use included cigarettes. He started smoking about 38 years ago. He has a 3 pack-year smoking history. He has never used smokeless tobacco. He reports that he does not currently use drugs. He reports that he does not drink alcohol.   Allergies  Allergen Reactions   Sulfonamide Derivatives Hives and Itching    Family History  Adopted: Yes     Prior to Admission medications   Medication Sig Start Date End Date Taking? Authorizing Provider  calcium carbonate (TUMS - DOSED IN MG ELEMENTAL CALCIUM) 500 MG chewable tablet Chew 1 tablet by mouth 3 (three) times daily with meals.   Yes [provider]  dapsone 100 MG tablet Take 1 tablet (100 mg total) by mouth daily. Patient taking differently: Take 100 mg by mouth daily with breakfast. 01/17/23  Yes Comer, Belia Heman, MD  Darunavir-Cobicistat-Emtricitabine-Tenofovir Alafenamide [facility]) 800-150-200-10 MG TABS Take 1 tablet by mouth daily with breakfast. 01/17/23  Yes Comer, Belia Heman, MD  fluconazole (DIFLUCAN) 200 MG tablet Take 1 tablet (200 mg total) by mouth daily. 05/25/23  Yes Amin, Ankit C, MD  ibuprofen (ADVIL) 200 MG tablet Take 200-400 mg by mouth every 6 (six) hours as needed for mild pain (pain score 1-3).   Yes [provider]  pantoprazole (PROTONIX) 40 MG tablet Take 1 tablet (40 mg total) by mouth 2 (two) times daily before a meal. 05/24/23  Yes Amin, Ankit C, MD  rosuvastatin (CRESTOR) 10 MG tablet Take 1 tablet (10 mg total) by mouth daily. Patient not taking: Reported on 05/17/2023 04/19/23   Gardiner Barefoot, MD    Physical Exam: BP (!) 136/90   Pulse 68   Temp 98.1 F (36.7 C) (Oral)   Resp 14   Ht 5\' 9"  (1.753 m)   Wt 72.6 kg   SpO2 96%   BMI 23.63 kg/m    General:  Alert, oriented, calm, in no acute distress  Eyes: EOMI, clear conjuctivae, white sclerea Neck: supple, no masses, trachea mildline  Cardiovascular: RRR, no murmurs or rubs, no peripheral edema  Respiratory: clear to auscultation bilaterally, no wheezes, no crackles  Abdomen: soft, nontender, nondistended, normal bowel tones heard  Skin: dry, no rashes  Musculoskeletal: no joint effusions, normal range of motion  Psychiatric: appropriate affect, normal speech  Neurologic: extraocular muscles intact, clear speech, moving all extremities with intact sensorium         Labs on Admission:  Basic Metabolic Panel: Recent Labs  Lab 05/23/23 0524 05/24/23 0537 05/29/23 0235  NA 133* 133* 133*  K 4.0 4.7 4.0  CL 101 101 105  CO2 24 25 22   GLUCOSE 141* 102* 97  BUN 25* 23 23  CREATININE 1.32* 1.24 1.06  CALCIUM 8.6* 8.7* 8.5*   Liver Function Tests: Recent Labs  Lab 05/29/23 0235  AST 24  ALT 32  ALKPHOS 90  BILITOT 0.3  PROT 7.6  ALBUMIN 3.4*   No results for input(s): "LIPASE", "AMYLASE" in the last 168 hours. No results for input(s): "AMMONIA" in the last 168 hours. CBC: Recent Labs  Lab 05/23/23 0524 05/24/23 0537 05/29/23 0235  WBC 8.3 7.6 5.8  NEUTROABS  --   --  3.4  HGB 9.3* 10.2* 9.6*  HCT 29.5* 31.5* 31.0*  MCV 98.3 98.7 102.6*  PLT 259 276 203   Cardiac Enzymes: No results for input(s): "CKTOTAL", "CKMB", "CKMBINDEX", "TROPONINI" in the last 168 hours.  BNP (last 3 results) Recent Labs    05/29/23 0233  BNP 57.9    ProBNP (last 3 results) No results for input(s): "PROBNP" in the last 8760 hours.  CBG: No results for input(s): "GLUCAP" in the last 168 hours.  Radiological Exams on Admission: CT Angio Chest PE W and/or Wo Contrast  Result Date: 05/29/2023 CLINICAL DATA:  Septic arterial embolism. EXAM: CT ANGIOGRAPHY CHEST WITH CONTRAST TECHNIQUE: Multidetector CT imaging of the chest was performed using the standard protocol during  bolus administration of intravenous contrast. Multiplanar CT image reconstructions and MIPs were obtained to evaluate the vascular anatomy. RADIATION DOSE REDUCTION: This exam was performed according to the departmental dose-optimization program which includes automated exposure control, adjustment of the mA and/or kV according to patient size and/or use of iterative reconstruction technique. CONTRAST:  75mL OMNIPAQUE IOHEXOL 350 MG/ML SOLN COMPARISON:  05/20/2023 chest CT. FINDINGS: Cardiovascular: Satisfactory opacification of the pulmonary arteries to the segmental level. No evidence of pulmonary embolism. Normal heart size. No pericardial effusion. 3.8 cm diameter ascending aorta. Aortic atherosclerosis. Mediastinum/Nodes: No worrisome lymph node. Lungs/Pleura: Generalized airway thickening since prior. There is airway thickening that is focally marked in the posterior segment of the left upper lobe, largely acute when compared to recent prior. Patchy areas of ground-glass density in the bilateral lungs, mild. Branching density over the right diaphragm  and nodular density over the left diaphragm, a pre-existing nodule measuring approximately 1 cm, three-month follow-up already recommended in the setting of active malignancy. Paraseptal emphysema at the apices. Upper Abdomen: Known hepatic metastatic disease.  No acute finding Musculoskeletal: No emergent finding. Review of the MIP images confirms the above findings. IMPRESSION: 1. Bronchitis with patchy areas of mild bronchopneumonia. 2. Known malignancy with pre-existing pulmonary nodule and recommended follow-up. 3. Atherosclerosis including the coronary arteries. Electronically Signed   By: Tiburcio Pea M.D.   On: 05/29/2023 04:40   DG Chest Portable 1 View  Result Date: 05/29/2023 CLINICAL DATA:  Shortness of breath EXAM: PORTABLE CHEST 1 VIEW COMPARISON:  05/17/2023 FINDINGS: Heart and mediastinal contours are within normal limits. No focal opacities  or effusions. No acute bony abnormality. IMPRESSION: No active disease. Electronically Signed   By: Charlett Nose M.D.   On: 05/29/2023 03:39    Assessment/Plan Jordan Calderon is a 62 y.o. 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   Bronchopneumonia-with cough, subjective shortness of breath.  However not hypoxic.  No leukocytosis, or evidence of sepsis. -Observation admission -Continue empiric cefepime for HCAP -No need for MRSA coverage as had recent negative MRSA swab  Right nasopharyngeal skull base mass-during his recent hospital stay, he had biopsy of ENT that was nondiagnostic, he had liver biopsy with IR.  Pathology reviewed, shows evidence of high-grade B-cell lymphoma.  Findings discussed with Dr. Arlana Pouch of oncology, who plans to see patient in the hospital during this hospital stay  HIV-continue Symtuza, dapsone  Thyroidism-Synthroid  GERD with esophagitis, seen on recent endoscopy-Protonix twice daily along with Diflucan  Hyperlipidemia-Crestor  DVT prophylaxis: Lovenox     Code Status: Full Code  Consults called: Oncology  Admission status: Observation  Time spent: 49 minutes  Jaelen Soth Sharlette Dense MD [facility] Pager [phone]  If 7PM-7AM, please contact night-coverage [email or web address] Password [email or web address]  05/29/2023, 8:50 AM

## 2023-05-29 NOTE — Telephone Encounter (Signed)
Left voicemail asking patient sister [name] to return my call.   Satoshi Kalas Lesli Albee, CMA

## 2023-05-29 NOTE — ED Notes (Signed)
ED TO INPATIENT HANDOFF REPORT  ED Nurse Name and Phone #: [email or web address] - ED  S Name/Age/Gender Jordan Calderon 62 y.o. male Room/Bed: WA21/WA21  Code Status   Code Status: Full Code  Home/SNF/Other Home Patient oriented to: self, place, time, and situation Is this baseline? Yes   Triage Complete: Triage complete  Chief Complaint HCAP (healthcare-associated pneumonia) [J18.9]  Triage Note Pt BIB EMS from home for headaches for a couple weeks after taking new medications. Today has been worse. Pt has not been consistent w/ eating due to loss of appetite.  Dx liver, stomach, and esophageal cancer  134/96 102 cbg 70s HR   Allergies Allergies  Allergen Reactions   Sulfonamide Derivatives Hives and Itching    Level of Care/Admitting Diagnosis ED Disposition     ED Disposition  Admit   Condition  --   Comment  Hospital Area: [facility] [[id number]]  Level of Care: Med-Surg [16]  May place patient in observation at [facility] or [facility] if equivalent level of care is available:: Yes  Covid Evaluation: Confirmed COVID Negative  Diagnosis: HCAP (healthcare-associated pneumonia) [161096]  Admitting Physician: Maryln Gottron [[id number]]  Attending Physician: Kirby Crigler, MIR [email or web address] [[id number]]          B Medical/Surgery History Past Medical History:  Diagnosis Date   Anxiety    Cellulitis 02/11/2014   LEFT ARM   Depression    Headache(784.0)    HIV (human immunodeficiency virus infection) (HCC)    Past Surgical History:  Procedure Laterality Date   ARM HARDWARE REMOVAL Left    arm surgery     BIOPSY  05/22/2023   Procedure: BIOPSY;  Surgeon: Benancio Deeds, MD;  Location: [facility];  Service: Gastroenterology;;   ESOPHAGOGASTRODUODENOSCOPY (EGD) WITH PROPOFOL N/A 05/22/2023   Procedure: ESOPHAGOGASTRODUODENOSCOPY (EGD) WITH PROPOFOL;  Surgeon: Benancio Deeds, MD;  Location: [facility];  Service: Gastroenterology;  Laterality:  N/A;   HEMORRHOID SURGERY     NASOPHARYNGEAL BIOPSY N/A 05/19/2023   Procedure: NASOPHARYNGEAL BIOPSY;  Surgeon: Newman Pies, MD;  Location: [facility] OR;  Service: ENT;  Laterality: N/A;     A IV Location/Drains/Wounds Patient Lines/Drains/Airways Status     Active Line/Drains/Airways     Name Placement date Placement time Site Days   Peripheral IV 05/29/23 20 G Right Antecubital 05/29/23  0353  Antecubital  less than 1   Wound / Incision (Open or Dehisced) 05/23/23 Puncture Flank Lower;Right 05/23/23  0909  Flank  6            Intake/Output Last 24 hours  Intake/Output Summary (Last 24 hours) at 05/29/2023 0916 Last data filed at 05/29/2023 0744 Gross per 24 hour  Intake 100 ml  Output --  Net 100 ml    Labs/Imaging Results for orders placed or performed during the hospital encounter of 05/29/23 (from the past 48 hour(s))  Brain natriuretic peptide     Status: None   Collection Time: 05/29/23  2:33 AM  Result Value Ref Range   B Natriuretic Peptide 57.9 0.0 - 100.0 pg/mL    Comment: Performed at [facility], [address] [place], [place] [address]  Resp panel by RT-PCR (RSV, Flu A&B, Covid) Anterior Nasal Swab     Status: None   Collection Time: 05/29/23  2:33 AM   Specimen: Anterior Nasal Swab  Result Value Ref Range   SARS Coronavirus 2 by RT PCR NEGATIVE NEGATIVE    Comment: (NOTE) SARS-CoV-2 target nucleic acids  are NOT DETECTED.  The SARS-CoV-2 RNA is generally detectable in upper respiratory specimens during the acute phase of infection. The lowest concentration of SARS-CoV-2 viral copies this assay can detect is 138 copies/mL. A negative result does not preclude SARS-Cov-2 infection and should not be used as the sole basis for treatment or other patient management decisions. A negative result may occur with  improper specimen collection/handling, submission of specimen other than nasopharyngeal swab, presence of viral mutation(s) within  the areas targeted by this assay, and inadequate number of viral copies(<138 copies/mL). A negative result must be combined with clinical observations, patient history, and epidemiological information. The expected result is Negative.  Fact Sheet for Patients:  BloggerCourse.com  Fact Sheet for Healthcare Providers:  SeriousBroker.it  This test is no t yet approved or cleared by the [place] FDA and  has been authorized for detection and/or diagnosis of SARS-CoV-2 by FDA under an Emergency Use Authorization (EUA). This EUA will remain  in effect (meaning this test can be used) for the duration of the COVID-19 declaration under Section 564(b)(1) of the Act, 21 U.S.C.section 360bbb-3(b)(1), unless the authorization is terminated  or revoked sooner.       Influenza A by PCR NEGATIVE NEGATIVE   Influenza B by PCR NEGATIVE NEGATIVE    Comment: (NOTE) The Xpert Xpress SARS-CoV-2/FLU/RSV plus assay is intended as an aid in the diagnosis of influenza from Nasopharyngeal swab specimens and should not be used as a sole basis for treatment. Nasal washings and aspirates are unacceptable for Xpert Xpress SARS-CoV-2/FLU/RSV testing.  Fact Sheet for Patients: BloggerCourse.com  Fact Sheet for Healthcare Providers: SeriousBroker.it  This test is not yet approved or cleared by the [place] FDA and has been authorized for detection and/or diagnosis of SARS-CoV-2 by FDA under an Emergency Use Authorization (EUA). This EUA will remain in effect (meaning this test can be used) for the duration of the COVID-19 declaration under Section 564(b)(1) of the Act, 21 U.S.C. section 360bbb-3(b)(1), unless the authorization is terminated or revoked.     Resp Syncytial Virus by PCR NEGATIVE NEGATIVE    Comment: (NOTE) Fact Sheet for Patients: BloggerCourse.com  Fact  Sheet for Healthcare Providers: SeriousBroker.it  This test is not yet approved or cleared by the [place] FDA and has been authorized for detection and/or diagnosis of SARS-CoV-2 by FDA under an Emergency Use Authorization (EUA). This EUA will remain in effect (meaning this test can be used) for the duration of the COVID-19 declaration under Section 564(b)(1) of the Act, 21 U.S.C. section 360bbb-3(b)(1), unless the authorization is terminated or revoked.  Performed at [facility], [address], [place], [place] [address]   CBC with Differential     Status: Abnormal   Collection Time: 05/29/23  2:35 AM  Result Value Ref Range   WBC 5.8 4.0 - 10.5 K/uL   RBC 3.02 (L) 4.22 - 5.81 MIL/uL   Hemoglobin 9.6 (L) 13.0 - 17.0 g/dL   HCT 60.4 (L) 54.0 - 98.1 %   MCV 102.6 (H) 80.0 - 100.0 fL   MCH 31.8 26.0 - 34.0 pg   MCHC 31.0 30.0 - 36.0 g/dL   RDW 19.1 47.8 - 29.5 %   Platelets 203 150 - 400 K/uL   nRBC 0.0 0.0 - 0.2 %   Neutrophils Relative % 57 %   Neutro Abs 3.4 1.7 - 7.7 K/uL   Lymphocytes Relative 21 %   Lymphs Abs 1.2 0.7 - 4.0 K/uL  Monocytes Relative 15 %   Monocytes Absolute 0.9 0.1 - 1.0 K/uL   Eosinophils Relative 4 %   Eosinophils Absolute 0.2 0.0 - 0.5 K/uL   Basophils Relative 2 %   Basophils Absolute 0.1 0.0 - 0.1 K/uL   Immature Granulocytes 1 %   Abs Immature Granulocytes 0.05 0.00 - 0.07 K/uL    Comment: Performed at [facility], [address], [place], [place] [address]  Comprehensive metabolic panel     Status: Abnormal   Collection Time: 05/29/23  2:35 AM  Result Value Ref Range   Sodium 133 (L) 135 - 145 mmol/L   Potassium 4.0 3.5 - 5.1 mmol/L   Chloride 105 98 - 111 mmol/L   CO2 22 22 - 32 mmol/L   Glucose, Bld 97 70 - 99 mg/dL    Comment: Glucose reference range applies only to samples taken after fasting for at least 8 hours.   BUN 23 8 - 23 mg/dL   Creatinine, Ser 6.04 0.61  - 1.24 mg/dL   Calcium 8.5 (L) 8.9 - 10.3 mg/dL   Total Protein 7.6 6.5 - 8.1 g/dL   Albumin 3.4 (L) 3.5 - 5.0 g/dL   AST 24 15 - 41 U/L   ALT 32 0 - 44 U/L   Alkaline Phosphatase 90 38 - 126 U/L   Total Bilirubin 0.3 <1.2 mg/dL   GFR, Estimated >54 >09 mL/min    Comment: (NOTE) Calculated using the CKD-EPI Creatinine Equation (2021)    Anion gap 6 5 - 15    Comment: Performed at [facility], [address], [place], [place] [address]  Troponin I (High Sensitivity)     Status: None   Collection Time: 05/29/23  2:35 AM  Result Value Ref Range   Troponin I (High Sensitivity) 14 <18 ng/L    Comment: (NOTE) Elevated high sensitivity troponin I (hsTnI) values and significant  changes across serial measurements may suggest ACS but many other  chronic and acute conditions are known to elevate hsTnI results.  Refer to the "Links" section for chest pain algorithms and additional  guidance. Performed at [facility], [address] [place], [place] [address]   Troponin I (High Sensitivity)     Status: None   Collection Time: 05/29/23  6:34 AM  Result Value Ref Range   Troponin I (High Sensitivity) 12 <18 ng/L    Comment: (NOTE) Elevated high sensitivity troponin I (hsTnI) values and significant  changes across serial measurements may suggest ACS but many other  chronic and acute conditions are known to elevate hsTnI results.  Refer to the "Links" section for chest pain algorithms and additional  guidance. Performed at [facility], [address] [place], [place] [address]    CT Angio Chest PE W and/or Wo Contrast  Result Date: 05/29/2023 CLINICAL DATA:  Septic arterial embolism. EXAM: CT ANGIOGRAPHY CHEST WITH CONTRAST TECHNIQUE: Multidetector CT imaging of the chest was performed using the standard protocol during bolus administration of intravenous contrast. Multiplanar CT image reconstructions and MIPs were obtained to  evaluate the vascular anatomy. RADIATION DOSE REDUCTION: This exam was performed according to the departmental dose-optimization program which includes automated exposure control, adjustment of the mA and/or kV according to patient size and/or use of iterative reconstruction technique. CONTRAST:  75mL OMNIPAQUE IOHEXOL 350 MG/ML SOLN COMPARISON:  05/20/2023 chest CT. FINDINGS: Cardiovascular: Satisfactory opacification of the pulmonary arteries to the segmental level. No evidence of pulmonary embolism. Normal  heart size. No pericardial effusion. 3.8 cm diameter ascending aorta. Aortic atherosclerosis. Mediastinum/Nodes: No worrisome lymph node. Lungs/Pleura: Generalized airway thickening since prior. There is airway thickening that is focally marked in the posterior segment of the left upper lobe, largely acute when compared to recent prior. Patchy areas of ground-glass density in the bilateral lungs, mild. Branching density over the right diaphragm and nodular density over the left diaphragm, a pre-existing nodule measuring approximately 1 cm, three-month follow-up already recommended in the setting of active malignancy. Paraseptal emphysema at the apices. Upper Abdomen: Known hepatic metastatic disease.  No acute finding Musculoskeletal: No emergent finding. Review of the MIP images confirms the above findings. IMPRESSION: 1. Bronchitis with patchy areas of mild bronchopneumonia. 2. Known malignancy with pre-existing pulmonary nodule and recommended follow-up. 3. Atherosclerosis including the coronary arteries. Electronically Signed   By: Tiburcio Pea M.D.   On: 05/29/2023 04:40   DG Chest Portable 1 View  Result Date: 05/29/2023 CLINICAL DATA:  Shortness of breath EXAM: PORTABLE CHEST 1 VIEW COMPARISON:  05/17/2023 FINDINGS: Heart and mediastinal contours are within normal limits. No focal opacities or effusions. No acute bony abnormality. IMPRESSION: No active disease. Electronically Signed   By: Charlett Nose M.D.   On: 05/29/2023 03:39    Pending Labs Unresulted Labs (From admission, onward)     Start     Ordered   05/29/23 0533  Blood culture (routine x 2)  BLOOD CULTURE X 2,   R     Question Answer Comment  Patient immune status Normal   Release to patient Immediate      05/29/23 0533            Vitals/Pain Today's Vitals   05/29/23 0436 05/29/23 0528 05/29/23 0736 05/29/23 0745  BP: 125/75   (!) 136/90  Pulse: 67   68  Resp: 18   14  Temp:  98.4 F (36.9 C)  98.1 F (36.7 C)  TempSrc:  Oral  Oral  SpO2: 98%   96%  Weight:    160 lb (72.6 kg)  Height:    5\' 9"  (1.753 m)  PainSc:   8      Isolation Precautions No active isolations  Medications Medications  dapsone tablet 100 mg (has no administration in time range)  Darunavir-Cobicistat-Emtricitabine-Tenofovir Alafenamide (SYMTUZA) 800-150-200-10 MG TABS 1 tablet (has no administration in time range)  fluconazole (DIFLUCAN) tablet 200 mg (has no administration in time range)  rosuvastatin (CRESTOR) tablet 10 mg (has no administration in time range)  pantoprazole (PROTONIX) EC tablet 40 mg (has no administration in time range)  enoxaparin (LOVENOX) injection 40 mg (has no administration in time range)  acetaminophen (TYLENOL) tablet 650 mg (has no administration in time range)    Or  acetaminophen (TYLENOL) suppository 650 mg (has no administration in time range)  traZODone (DESYREL) tablet 25 mg (has no administration in time range)  ondansetron (ZOFRAN) tablet 4 mg (has no administration in time range)    Or  ondansetron (ZOFRAN) injection 4 mg (has no administration in time range)  albuterol (PROVENTIL) (2.5 MG/3ML) 0.083% nebulizer solution 2.5 mg (has no administration in time range)  ceFEPIme (MAXIPIME) 2 g in sodium chloride 0.9 % 100 mL IVPB (has no administration in time range)  iohexol (OMNIPAQUE) 350 MG/ML injection 75 mL (75 mLs Intravenous Contrast Given 05/29/23 0416)  vancomycin (VANCOCIN) IVPB  1000 mg/200 mL premix (1,000 mg Intravenous New Bag/Given 05/29/23 0741)  ceFEPIme (MAXIPIME) 2 g in sodium chloride 0.9 %  100 mL IVPB (0 g Intravenous Stopped 05/29/23 0744)  ketorolac (TORADOL) 30 MG/ML injection 30 mg (30 mg Intravenous Given 05/29/23 0736)    Mobility walks     Focused Assessments    R Recommendations: See Admitting Provider Note  Report given to:   Additional Notes:

## 2023-05-29 NOTE — Progress Notes (Signed)
Pharmacy Antibiotic Note  Jordan Calderon is a 62 y.o. male admitted on 05/29/2023 with pneumonia.  Pharmacy has been consulted for cefepime dosing.  Plan: Cefepime 2 gr IV q8h  Monitor clinical course, renal function, cultures as available   Height: 5\' 9"  (175.3 cm) Weight: 72.6 kg (160 lb) IBW/kg (Calculated) : 70.7  Temp (24hrs), Avg:98.1 F (36.7 C), Min:97.9 F (36.6 C), Max:98.4 F (36.9 C)  Recent Labs  Lab 05/23/23 0524 05/24/23 0537 05/29/23 0235  WBC 8.3 7.6 5.8  CREATININE 1.32* 1.24 1.06    Estimated Creatinine Clearance: 71.3 mL/min (by C-G formula based on SCr of 1.06 mg/dL).    Allergies  Allergen Reactions   Sulfonamide Derivatives Hives and Itching    Antimicrobials this admission: 12/9 cefepime >>     Dose adjustments this admission:   Microbiology results: 12/9 BCx:   Adalberto Cole, PharmD, BCPS 05/29/2023 8:53 AM

## 2023-05-29 NOTE — ED Provider Notes (Signed)
[facility] AT [facility] Provider Note   CSN: [id number] Arrival date & time: 05/29/23  0100     History  Chief Complaint  Patient presents with   Headache    Jordan Calderon is a 62 y.o. male.  The history is provided by the patient.  Headache Location: facial. Onset quality:  Gradual Timing:  Constant Progression:  Unchanged Chronicity:  New Worsened by:  Nothing Ineffective treatments:  None tried Associated symptoms: no diarrhea, no dizziness and no fever   Risk factors: no anger        Home Medications Prior to Admission medications   Medication Sig Start Date End Date Taking? Authorizing Provider  calcium carbonate (TUMS - DOSED IN MG ELEMENTAL CALCIUM) 500 MG chewable tablet Chew 1 tablet by mouth 3 (three) times daily with meals.   Yes [provider]  dapsone 100 MG tablet Take 1 tablet (100 mg total) by mouth daily. Patient taking differently: Take 100 mg by mouth daily with breakfast. 01/17/23  Yes Comer, Belia Heman, MD  Darunavir-Cobicistat-Emtricitabine-Tenofovir Alafenamide [facility]) 800-150-200-10 MG TABS Take 1 tablet by mouth daily with breakfast. 01/17/23  Yes Comer, Belia Heman, MD  fluconazole (DIFLUCAN) 200 MG tablet Take 1 tablet (200 mg total) by mouth daily. 05/25/23  Yes Amin, Ankit C, MD  ibuprofen (ADVIL) 200 MG tablet Take 200-400 mg by mouth every 6 (six) hours as needed for mild pain (pain score 1-3).   Yes [provider]  pantoprazole (PROTONIX) 40 MG tablet Take 1 tablet (40 mg total) by mouth 2 (two) times daily before a meal. 05/24/23  Yes Amin, Ankit C, MD  rosuvastatin (CRESTOR) 10 MG tablet Take 1 tablet (10 mg total) by mouth daily. Patient not taking: Reported on 05/17/2023 04/19/23   Gardiner Barefoot, MD      Allergies    Sulfonamide derivatives    Review of Systems   Review of Systems  Constitutional:  Negative for fever.  HENT:  Negative for ear discharge.   Eyes:  Negative for  redness.  Cardiovascular:  Negative for chest pain.  Gastrointestinal:  Negative for diarrhea.  Neurological:  Positive for headaches. Negative for dizziness.  All other systems reviewed and are negative.   Physical Exam Updated Vital Signs BP 125/75 (BP Location: Right Arm)   Pulse 67   Temp 98.4 F (36.9 C) (Oral)   Resp 18   SpO2 98%  Physical Exam Vitals and nursing note reviewed.  Constitutional:      General: He is not in acute distress.    Appearance: Normal appearance. He is well-developed. He is not diaphoretic.  HENT:     Head: Normocephalic and atraumatic.     Nose: Nose normal.  Eyes:     Conjunctiva/sclera: Conjunctivae normal.     Pupils: Pupils are equal, round, and reactive to light.  Cardiovascular:     Rate and Rhythm: Normal rate and regular rhythm.     Pulses: Normal pulses.     Heart sounds: Normal heart sounds.  Pulmonary:     Effort: Pulmonary effort is normal.     Breath sounds: Normal breath sounds. No wheezing or rales.  Abdominal:     General: Bowel sounds are normal.     Palpations: Abdomen is soft.     Tenderness: There is no abdominal tenderness. There is no guarding or rebound.  Musculoskeletal:        General: Normal range of motion.     Cervical  back: Normal range of motion and neck supple.  Skin:    General: Skin is warm and dry.     Capillary Refill: Capillary refill takes less than 2 seconds.  Neurological:     General: No focal deficit present.     Mental Status: He is alert and oriented to person, place, and time.     Deep Tendon Reflexes: Reflexes normal.  Psychiatric:        Mood and Affect: Mood normal.        Behavior: Behavior normal.     ED Results / Procedures / Treatments   Labs (all labs ordered are listed, but only abnormal results are displayed) Results for orders placed or performed during the hospital encounter of 05/29/23  Resp panel by RT-PCR (RSV, Flu A&B, Covid) Anterior Nasal Swab   Specimen: Anterior  Nasal Swab  Result Value Ref Range   SARS Coronavirus 2 by RT PCR NEGATIVE NEGATIVE   Influenza A by PCR NEGATIVE NEGATIVE   Influenza B by PCR NEGATIVE NEGATIVE   Resp Syncytial Virus by PCR NEGATIVE NEGATIVE  CBC with Differential  Result Value Ref Range   WBC 5.8 4.0 - 10.5 K/uL   RBC 3.02 (L) 4.22 - 5.81 MIL/uL   Hemoglobin 9.6 (L) 13.0 - 17.0 g/dL   HCT 47.8 (L) 29.5 - 62.1 %   MCV 102.6 (H) 80.0 - 100.0 fL   MCH 31.8 26.0 - 34.0 pg   MCHC 31.0 30.0 - 36.0 g/dL   RDW 30.8 65.7 - 84.6 %   Platelets 203 150 - 400 K/uL   nRBC 0.0 0.0 - 0.2 %   Neutrophils Relative % 57 %   Neutro Abs 3.4 1.7 - 7.7 K/uL   Lymphocytes Relative 21 %   Lymphs Abs 1.2 0.7 - 4.0 K/uL   Monocytes Relative 15 %   Monocytes Absolute 0.9 0.1 - 1.0 K/uL   Eosinophils Relative 4 %   Eosinophils Absolute 0.2 0.0 - 0.5 K/uL   Basophils Relative 2 %   Basophils Absolute 0.1 0.0 - 0.1 K/uL   Immature Granulocytes 1 %   Abs Immature Granulocytes 0.05 0.00 - 0.07 K/uL  Comprehensive metabolic panel  Result Value Ref Range   Sodium 133 (L) 135 - 145 mmol/L   Potassium 4.0 3.5 - 5.1 mmol/L   Chloride 105 98 - 111 mmol/L   CO2 22 22 - 32 mmol/L   Glucose, Bld 97 70 - 99 mg/dL   BUN 23 8 - 23 mg/dL   Creatinine, Ser 9.62 0.61 - 1.24 mg/dL   Calcium 8.5 (L) 8.9 - 10.3 mg/dL   Total Protein 7.6 6.5 - 8.1 g/dL   Albumin 3.4 (L) 3.5 - 5.0 g/dL   AST 24 15 - 41 U/L   ALT 32 0 - 44 U/L   Alkaline Phosphatase 90 38 - 126 U/L   Total Bilirubin 0.3 <1.2 mg/dL   GFR, Estimated >95 >28 mL/min   Anion gap 6 5 - 15  Brain natriuretic peptide  Result Value Ref Range   B Natriuretic Peptide 57.9 0.0 - 100.0 pg/mL  Troponin I (High Sensitivity)  Result Value Ref Range   Troponin I (High Sensitivity) 14 <18 ng/L   CT Angio Chest PE W and/or Wo Contrast  Result Date: 05/29/2023 CLINICAL DATA:  Septic arterial embolism. EXAM: CT ANGIOGRAPHY CHEST WITH CONTRAST TECHNIQUE: Multidetector CT imaging of the chest was  performed using the standard protocol during bolus administration of intravenous contrast. Multiplanar CT  image reconstructions and MIPs were obtained to evaluate the vascular anatomy. RADIATION DOSE REDUCTION: This exam was performed according to the departmental dose-optimization program which includes automated exposure control, adjustment of the mA and/or kV according to patient size and/or use of iterative reconstruction technique. CONTRAST:  75mL OMNIPAQUE IOHEXOL 350 MG/ML SOLN COMPARISON:  05/20/2023 chest CT. FINDINGS: Cardiovascular: Satisfactory opacification of the pulmonary arteries to the segmental level. No evidence of pulmonary embolism. Normal heart size. No pericardial effusion. 3.8 cm diameter ascending aorta. Aortic atherosclerosis. Mediastinum/Nodes: No worrisome lymph node. Lungs/Pleura: Generalized airway thickening since prior. There is airway thickening that is focally marked in the posterior segment of the left upper lobe, largely acute when compared to recent prior. Patchy areas of ground-glass density in the bilateral lungs, mild. Branching density over the right diaphragm and nodular density over the left diaphragm, a pre-existing nodule measuring approximately 1 cm, three-month follow-up already recommended in the setting of active malignancy. Paraseptal emphysema at the apices. Upper Abdomen: Known hepatic metastatic disease.  No acute finding Musculoskeletal: No emergent finding. Review of the MIP images confirms the above findings. IMPRESSION: 1. Bronchitis with patchy areas of mild bronchopneumonia. 2. Known malignancy with pre-existing pulmonary nodule and recommended follow-up. 3. Atherosclerosis including the coronary arteries. Electronically Signed   By: Tiburcio Pea M.D.   On: 05/29/2023 04:40   DG Chest Portable 1 View  Result Date: 05/29/2023 CLINICAL DATA:  Shortness of breath EXAM: PORTABLE CHEST 1 VIEW COMPARISON:  05/17/2023 FINDINGS: Heart and mediastinal contours  are within normal limits. No focal opacities or effusions. No acute bony abnormality. IMPRESSION: No active disease. Electronically Signed   By: Charlett Nose M.D.   On: 05/29/2023 03:39   DG Swallowing Func-Speech Pathology  Result Date: 05/24/2023 Table formatting from the original result was not included. Modified Barium Swallow Study Patient Details Name: NASAIR IWEN MRN: [id number] Date of Birth: 1960/12/17 Today's Date: 05/24/2023 HPI/PMH: HPI: Kimble Umansky is a 62 yo male who presented to [facility] on 11/27 with lightheadedness, R ear pain, R ptosis, HA, cough, n/v. 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 CT NECK FINDINGS:  Pharynx and larynx: There is a large right nasopharyngeal mass that  extends inferiorly to the level the greater horn of the hyoid bone  on right. This mass involves the right-sided adenoid and palatine  tonsils, the right parapharyngeal space, the right masticator space,  the right parotid space. Superiorly this mass likely extends to the  level of the foramen ovale bilaterally.  CT CHEST showed liver lesions concerning for mets; patulous esophagus. Results of biopsies pending. EGD 12/2: gastric ulcer, severe esophagitis, candidiasis. ENT and oncology following. PMH includes HIV on Hart therapy, HLD, depression, anxiety, AKI. Clinical Impression: Pt demonstrates mild oropharyngeal dysphagia characterized by mild residue in the vallecula and pyriform sinuses. There is appearance of mildly decreased hyoid excursion but very good laryngeal elevation. Pt may have decreased base of tongue retraction and decreased pharyngeal contraction that lead to pharyngeal residuals. The pt clears with an effortful swallow. Residue most significant with thin liquids. There was one instance of trace penetration post swallow with wet vocal quality and throat clear ejecting. Pt recommended to continue foods and drinks of choice. Suggested effortful  swallows as a way to clear residue, but also to use as part of an initial home exercise program for pre radiation treatment (potential). Introduced concepts of radiation fibrosis, dysphagia and the importance of continueing swallowing. Pt not yet  ready to absorb this and will need further education. Pt to f/u with OP SLP for further education and HEP. Factors that may increase risk of adverse event in presence of aspiration Rubye Oaks & Clearance Coots 2021): No data recorded Recommendations/Plan: Swallowing Evaluation Recommendations Swallowing Evaluation Recommendations Recommendations: PO diet PO Diet Recommendation: Regular; Thin liquids (Level 0) Liquid Administration via: Cup; Straw Medication Administration: Whole meds with liquid Supervision: Patient able to self-feed Swallowing strategies  : effortful swallow Postural changes: Stay upright 30-60 min after meals Oral care recommendations: Oral care BID (2x/day) Treatment Plan Treatment Plan Treatment recommendations: Defer treatment plan to SLP at other venue (see follow-up recommendations) Follow-up recommendations: Outpatient SLP Recommendations Recommendations for follow up therapy are one component of a multi-disciplinary discharge planning process, led by the attending physician.  Recommendations may be updated based on patient status, additional functional criteria and insurance authorization. Assessment: Orofacial Exam: Orofacial Exam Oral Cavity - Dentition: Adequate natural dentition Anatomy: No data recorded Boluses Administered: Boluses Administered Boluses Administered: Thin liquids (Level 0); Mildly thick liquids (Level 2, nectar thick); Moderately thick liquids (Level 3, honey thick); Puree; Solid  Oral Impairment Domain: Oral Impairment Domain Lip Closure: No labial escape Tongue control during bolus hold: Cohesive bolus between tongue to palatal seal Bolus preparation/mastication: Timely and efficient chewing and mashing Bolus transport/lingual motion:  Brisk tongue motion Oral residue: Complete oral clearance Location of oral residue : N/A Initiation of pharyngeal swallow : Posterior laryngeal surface of the epiglottis  Pharyngeal Impairment Domain: Pharyngeal Impairment Domain Soft palate elevation: No bolus between soft palate (SP)/pharyngeal wall (PW) Laryngeal elevation: Complete superior movement of thyroid cartilage with complete approximation of arytenoids to epiglottic petiole Anterior hyoid excursion: Partial anterior movement Epiglottic movement: Complete inversion Laryngeal vestibule closure: Complete, no air/contrast in laryngeal vestibule Pharyngeal stripping wave : Present - diminished Pharyngeal contraction (A/P view only): N/A Pharyngoesophageal segment opening: Complete distension and complete duration, no obstruction of flow Tongue base retraction: Trace column of contrast or air between tongue base and PPW Pharyngeal residue: Trace residue within or on pharyngeal structures; Collection of residue within or on pharyngeal structures Location of pharyngeal residue: Valleculae; Pyriform sinuses  Esophageal Impairment Domain: Esophageal Impairment Domain Esophageal clearance upright position: Esophageal retention Pill: Pill Consistency administered: Thin liquids (Level 0) Penetration/Aspiration Scale Score: No data recorded Compensatory Strategies: Compensatory Strategies Compensatory strategies: Yes Effortful swallow: Effective Chin tuck: Ineffective   General Information: Caregiver present: No  Diet Prior to this Study: Regular; Thin liquids (Level 0)   Temperature : Normal   Respiratory Status: WFL   Supplemental O2: None (Room air)   No data recorded Behavior/Cognition: Alert; Cooperative Self-Feeding Abilities: Able to self-feed Baseline vocal quality/speech: Dysphonic Volitional Cough: Able to elicit Volitional Swallow: Able to elicit No data recorded Goal Planning: No data recorded No data recorded No data recorded No data recorded No data  recorded Pain: No data recorded End of Session: Start Time:SLP Start Time (ACUTE ONLY): 1115 Stop Time: SLP Stop Time (ACUTE ONLY): 1136 Time Calculation:SLP Time Calculation (min) (ACUTE ONLY): 21 min Charges: SLP Evaluations $ SLP Speech Visit: 1 Visit SLP Evaluations $BSS Swallow: 1 Procedure $MBS Swallow: 1 Procedure $Swallowing Treatment: 1 Procedure SLP visit diagnosis: SLP Visit Diagnosis: Dysphagia, oropharyngeal phase (R13.12) Past Medical History: Past Medical History: Diagnosis Date  Anxiety   Cellulitis 02/11/2014  LEFT ARM  Depression   Headache(784.0)   HIV (human immunodeficiency virus infection) (HCC)  Past Surgical History: Past Surgical History: Procedure Laterality Date  ARM HARDWARE REMOVAL  Left   arm surgery    HEMORRHOID SURGERY    NASOPHARYNGEAL BIOPSY N/A 05/19/2023  Procedure: NASOPHARYNGEAL BIOPSY;  Surgeon: Newman Pies, MD;  Location: [facility] OR;  Service: ENT;  Laterality: N/A; [place], Riley Nearing 05/24/2023, 12:22 PM  CT ABDOMEN PELVIS W CONTRAST  Result Date: 05/23/2023 CLINICAL DATA:  Abnormal neck and chest CT, nasopharyngeal mass EXAM: CT ABDOMEN AND PELVIS WITH CONTRAST TECHNIQUE: Multidetector CT imaging of the abdomen and pelvis was performed using the standard protocol following bolus administration of intravenous contrast. RADIATION DOSE REDUCTION: This exam was performed according to the departmental dose-optimization program which includes automated exposure control, adjustment of the mA and/or kV according to patient size and/or use of iterative reconstruction technique. CONTRAST:  75mL OMNIPAQUE IOHEXOL 350 MG/ML SOLN COMPARISON:  CT 05/20/2023 FINDINGS: Lower chest: See separately dictated recently performed chest CT. Left lung base pulmonary nodule measuring 6 mm on series 4, image 21. No acute airspace disease. Hepatobiliary: Multiple hypodense liver masses concerning for metastatic disease. Index right hepatic lobe mass lesion measures 2.8 x 2.3 cm on series 3, image 22.  Index left hepatic lobe lesion measures 2.4 x 2.6 cm on series 3, image 31. Numerous additional hypodense liver masses. No calcified gallstone or biliary dilatation. Pancreas: Unremarkable. No pancreatic ductal dilatation or surrounding inflammatory changes. Spleen: Normal in size without focal abnormality. Adrenals/Urinary Tract: Adrenal glands are normal. Kidneys show no hydronephrosis. The bladder is unremarkable Stomach/Bowel: Stomach nonenlarged. Focal wall thickening of the mid gastric lumen, series 3, image 36 and coronal series 6, image 77. On sagittal views, ulcer crater at the lesser curvature of stomach, series 7, image 87 and 88. No small bowel distension. No acute bowel wall thickening. Vascular/Lymphatic: Nonaneurysmal aorta with mild atherosclerosis. Mildly enlarged gastrohepatic lymph nodes measuring 14 x 14 mm on series 3, image 27. Negative for retroperitoneal or pelvic adenopathy. Reproductive: Prostate is unremarkable. Other: Negative for pelvic effusion or free air for small fat containing inguinal hernias. Musculoskeletal: No acute or suspicious osseous abnormality. IMPRESSION: 1. Multiple hypodense liver masses concerning for metastatic disease. Mildly enlarged gastrohepatic lymph nodes also suspicious for metastatic disease. 2. Focal wall thickening of the mid gastric lumen with ulcer crater at the lesser curvature of stomach. Correlate with endoscopy if not already performed. 3. Left lung base pulmonary nodule measuring 6 mm. See separately dictated recently performed chest CT. 4. Aortic atherosclerosis. Aortic Atherosclerosis (ICD10-I70.0). Electronically Signed   By: Jasmine Pang M.D.   On: 05/23/2023 17:52   US BIOPSY (LIVER)  Result Date: 05/23/2023 INDICATION: 63 year old with multiple liver lesions. Findings are suggestive for metastatic disease. Diagnosis is needed. EXAM: ULTRASOUND-GUIDED LIVER LESION BIOPSY MEDICATIONS: Moderate sedation ANESTHESIA/SEDATION: Moderate  (conscious) sedation was employed during this procedure. A total of Versed 1 mg and Fentanyl 25 mcg was administered intravenously by the radiology nurse. Total intra-service moderate Sedation Time: 18 minutes. The patient's level of consciousness and vital signs were monitored continuously by radiology nursing throughout the procedure under my direct supervision. FLUOROSCOPY TIME:  None COMPLICATIONS: None immediate. PROCEDURE: Informed written consent was obtained from the patient after a thorough discussion of the procedural risks, benefits and alternatives. All questions were addressed. Maximal Sterile Barrier Technique was utilized including caps, mask, sterile gowns, sterile gloves, sterile drape, hand hygiene and skin antiseptic. A timeout was performed prior to the initiation of the procedure. Abdomen was evaluated with ultrasound. A right hepatic lesion was targeted. The right side of the abdomen was prepped with chlorhexidine and sterile field was  created. Skin was anesthetized using 1% lidocaine. Small incision was made. Using ultrasound guidance, 17 gauge coaxial needle was directed into a right hepatic lesion. Total of 3 core biopsies were performed with an 18 gauge core device. Three adequate specimens obtained and placed in formalin. 17 gauge coaxial needle was removed without complication. Bandage placed over the puncture site. FINDINGS: Several hyperechoic lesions scattered throughout the liver. A lesion in the right hepatic lobe was targeted. Biopsy needle confirmed within the lesion. Three adequate specimens obtained. No immediate bleeding or hematoma formation. IMPRESSION: Successful ultrasound-guided core biopsies from a right hepatic lesion. Electronically Signed   By: Richarda Overlie M.D.   On: 05/23/2023 10:22   CT CHEST W CONTRAST  Result Date: 05/20/2023 CLINICAL DATA:  Staging colon cancer. New nasopharyngeal mass. * Tracking Code: BO * EXAM: CT CHEST WITH CONTRAST TECHNIQUE: Multidetector  CT imaging of the chest was performed during intravenous contrast administration. RADIATION DOSE REDUCTION: This exam was performed according to the departmental dose-optimization program which includes automated exposure control, adjustment of the mA and/or kV according to patient size and/or use of iterative reconstruction technique. CONTRAST:  50mL OMNIPAQUE IOHEXOL 350 MG/ML SOLN COMPARISON:  None Available. FINDINGS: Cardiovascular: Heart is nonenlarged. Coronary artery calcifications are seen. Please correlate for other coronary risk factors. Diameter of the ascending aorta measures 4.4 by 4.4 cm at the level of the right pulmonary artery. More normal caliber arch and descending thoracic aorta. Mild vascular calcifications along the aorta. Mediastinum/Nodes: Preserved thyroid gland. Slightly patulous thoracic esophagus with some luminal air and fluid. No specific abnormal lymph node enlargement present in the axillary region, hilum or mediastinum. A few small less than 1 cm size mediastinal and right hilar nodes are seen, nonpathologic by size criteria. Lungs/Pleura: No consolidation, pneumothorax or effusion. Slight breathing motion. Left lower lobe lung nodule identified measuring 8 by 3 mm on series 4, image 136. Similar focus in the right lung base measuring 7 mm on series 4 image 123. Few other tiny nodules in the right lung such as series 4, image 119 in the lower lobe. Few patchy areas of ground-glass type areas are seen as well. Apical pleural thickening identified with some paraseptal and centrilobular emphysematous changes. Some areas of interstitial septal thickening as well. Upper Abdomen: Multiple liver masses are identified. At least 15 lesions are seen. Example in segment 2 on series 3, image 147 measures 2.7 by 2.6 cm. Middle lobe lesion on series 3, image 174 measures 2.7 by 2.7 cm. Adrenal glands are preserved. There also abnormal nodes identified such as lesser curve of the stomach measuring  16 x 20 mm. Musculoskeletal: Mild degenerative changes along the spine. IMPRESSION: Numerous liver metastases. Upper abdominal abnormal nodes. Further workup of the abdomen and pelvis may be useful when clinically appropriate if there is no known previous. Patulous esophagus with slight wall thickening. Please correlate with symptoms and additional evaluation when clinically appropriate. Small mediastinal lymph nodes.  Attention on follow-up. Small lung nodules measuring up to 8 mm. Recommend follow up in 3 months or as per the patient's primary neoplasm. Coronary artery calcifications. In addition dilatation of the ascending aorta. Recommend annual imaging followup by CTA or MRA. This recommendation follows 2010 ACCF/AHA/AATS/ACR/ASA/SCA/SCAI/SIR/STS/SVM Guidelines for the Diagnosis and Management of Patients with Thoracic Aortic Disease. Circulation. 2010; 121: V425-Z563. Aortic aneurysm NOS (ICD10-I71.9) Aortic Atherosclerosis (ICD10-I70.0) and Emphysema (ICD10-J43.9). Electronically Signed   By: Karen Kays M.D.   On: 05/20/2023 14:09   CT SOFT TISSUE NECK  W CONTRAST  Result Date: 05/18/2023 CLINICAL DATA:  Neck mass EXAM: CT NECK WITH CONTRAST TECHNIQUE: Multidetector CT imaging of the neck was performed using the standard protocol following the bolus administration of intravenous contrast. RADIATION DOSE REDUCTION: This exam was performed according to the departmental dose-optimization program which includes automated exposure control, adjustment of the mA and/or kV according to patient size and/or use of iterative reconstruction technique. CONTRAST:  75mL OMNIPAQUE IOHEXOL 350 MG/ML SOLN COMPARISON:  CTA head/neck 05/17/23 FINDINGS: Pharynx and larynx: There is a large right nasopharyngeal mass that extends inferiorly to the level the greater horn of the hyoid bone on right. This mass involves the right-sided adenoid and palatine tonsils, the right parapharyngeal space, the right masticator space, the  right parotid space. Superiorly this mass likely extends to the level of the foramen ovale bilaterally. Salivary glands: The lateral extension of the large nasopharyngeal mass abuts the right parotid gland in the superior aspect of the right submandibular gland. There is a 9 mm hypodense lesion in the left parotid gland. Thyroid: Normal. Lymph nodes: There is a 9 mm hypodense lesion in the left parotid gland (series 2, image 46). It is unclear if this represents a necrotic lymph node or an intraparotid cyst. Vascular: Negative. Limited intracranial: Negative. Visualized orbits: Negative. Mastoids and visualized paranasal sinuses: No middle ear or mastoid effusion. Paranasal sinuses are clear. Skeleton: No acute or aggressive process. Upper chest: Negative. Other: None IMPRESSION: 1. Large right nasopharyngeal mass that extends inferiorly to the level the greater horn of the hyoid bone on the right. This mass involves the right-sided adenoid and palatine tonsils, the right parapharyngeal space, the right masticator space, the right parotid space. Superiorly this mass likely extends to the level of the foramen ovale bilaterally. 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 Recommend ENT consultation. 2. A 9 mm hypodense lesion in the left parotid gland. It is unclear if this represents a necrotic lymph node or an intraparotid cyst. Electronically Signed   By: Lorenza Cambridge M.D.   On: 05/18/2023 11:31   MR ANGIO HEAD WO CONTRAST  Result Date: 05/17/2023 CLINICAL DATA:  Initial evaluation for persistent dizziness. EXAM: MRA HEAD WITHOUT CONTRAST TECHNIQUE: Angiographic images of the Circle of Willis were acquired using MRA technique without intravenous contrast. COMPARISON:  CTA from earlier the same day. FINDINGS: Anterior circulation: Both internal carotid arteries are widely patent through the siphons without stenosis or other abnormality. Again seen is a 3 mm outpouching extending posteriorly  and laterally from the left ICA terminus (series 2, image 93). Possible small vessel emanating from its apex, not entirely certain. Finding could reflect an vascular infundibulum versus aneurysm. A1 segments patent bilaterally, with the right dominant. Normal anterior communicating artery complex. Anterior cerebral arteries patent without stenosis. No M1 stenosis or occlusion. Distal MCA branches perfused and symmetric. Posterior circulation: Visualized V4 segments patent without stenosis. Right PICA patent. Left PICA origin not seen. Basilar patent without stenosis. Superior cerebellar and posterior cerebral arteries widely patent bilaterally. Anatomic variants: As above. Other: Previously identified mass at the right skull base noted, partially visualized, better evaluated on prior CT. IMPRESSION: 1. 3 mm outpouching extending posteriorly and laterally from the left ICA terminus, potentially reflecting an vascular infundibulum versus aneurysm. Attention at follow-up recommended. 2. Otherwise normal intracranial MRA. No large vessel occlusion or other emergent finding. Electronically Signed   By: Rise Mu M.D.   On: 05/17/2023 20:58   CT ANGIO HEAD NECK W WO CM  Result Date: 05/17/2023 CLINICAL DATA:  Cerebral vasospasm suspected EXAM: CT ANGIOGRAPHY HEAD AND NECK WITH AND WITHOUT CONTRAST TECHNIQUE: Multidetector CT imaging of the head and neck was performed using the standard protocol during bolus administration of intravenous contrast. Multiplanar CT image reconstructions and MIPs were obtained to evaluate the vascular anatomy. Carotid stenosis measurements (when applicable) are obtained utilizing NASCET criteria, using the distal internal carotid diameter as the denominator. RADIATION DOSE REDUCTION: This exam was performed according to the departmental dose-optimization program which includes automated exposure control, adjustment of the mA and/or kV according to patient size and/or use of  iterative reconstruction technique. CONTRAST:  75mL OMNIPAQUE IOHEXOL 350 MG/ML SOLN COMPARISON:  None Available. FINDINGS: CT HEAD FINDINGS Brain: No evidence of acute infarction, hemorrhage, hydrocephalus, extra-axial collection or intraparenchymal mass lesion/mass effect. Vascular: See below. Skull: No acute fracture. Sinuses/Orbits: Clear sinuses.  No acute orbital findings. Other: No mastoid effusions. Review of the MIP images confirms the above findings CTA NECK FINDINGS Aortic arch: Great vessel origins are patent without significant stenosis. Right carotid system: No evidence of dissection, stenosis (50% or greater), or occlusion. Left carotid system: No evidence of dissection, stenosis (50% or greater), or occlusion. Mild atherosclerosis at the carotid bifurcation. Vertebral arteries: Codominant. No evidence of dissection, stenosis (50% or greater), or occlusion. Skeleton: No acute abnormality on limited assessment. 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 Review of the MIP images confirms the above findings CTA HEAD FINDINGS Anterior circulation: Bilateral intracranial ICAs, MCAs, and ACAs are patent without proximal hemodynamically significant stenosis. Hypoplastic left A1 ACA, likely congenital. Approximately 2 mm posteriorly inferiorly directed outpouching arising from the left carotid terminus (for example see series 605, image 111 and series 604, image 154 Posterior circulation: Tortuous vertebral arteries. The vertebral arteries, basilar artery and bilateral posterior cerebral arteries are patent without proximal hemodynamically significant stenosis. Venous sinuses: As permitted by contrast timing, patent. Anatomic variants: Described above. Review of the MIP images confirms the above findings IMPRESSION: 1. 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 Recommend ENT consultation, correlation with direct inspection, and follow-up dedicated CT of the neck with contrast for better characterization. 2. No emergent large vessel occlusion or proximal hemodynamically significant stenosis. 3. Approximately 2 mm posteriorly inferiorly directed outpouching arising from the left carotid terminus which could represent an infundibulum with vessel not well seen versus aneurysm. 4. Aortic Atherosclerosis (ICD10-I70.0) and Emphysema (ICD10-J43.9). Electronically Signed   By: Feliberto Harts M.D.   On: 05/17/2023 14:55   DG Chest Port 1 View  Result Date: 05/17/2023 CLINICAL DATA:  Worsening cough and dry heaving with shortness of breath EXAM: PORTABLE CHEST 1 VIEW COMPARISON:  Chest radiograph dated 05/11/2023 FINDINGS: Normal lung volumes. No focal consolidations. No pleural effusion or pneumothorax. The heart size and mediastinal contours are within normal limits. No acute osseous abnormality. IMPRESSION: No active disease. Electronically Signed   By: Agustin Cree M.D.   On: 05/17/2023 14:04   ECHOCARDIOGRAM COMPLETE  Result Date: 05/12/2023    ECHOCARDIOGRAM REPORT   Patient Name:   EVER COLOMBE Toman Date of Exam: 05/12/2023 Medical Rec #:  [id number]       Height:       69.0 in Accession #:    [id number]      Weight:       154.6 lb Date of Birth:  05/25/1961  BSA:          1.852 m Patient Age:    62 years        BP:           124/67 mmHg Patient Gender: M               HR:           73 bpm. Exam Location:  Inpatient Procedure: 2D Echo, Color Doppler and Cardiac Doppler Indications:    Syncope  History:        Patient has no prior history of Echocardiogram examinations.                 Signs/Symptoms:Syncope; Risk Factors:HIV, Polysubstance Abuse                 and Former Smoker.  Sonographer:    Milbert Coulter Referring Phys: [id number] JARED M GARDNER IMPRESSIONS  1. Left ventricular ejection fraction, by estimation, is 60 to 65%.  The left ventricle has normal function. The left ventricle has no regional wall motion abnormalities. Left ventricular diastolic parameters were normal.  2. Right ventricular systolic function is normal. The right ventricular size is normal. There is normal pulmonary artery systolic pressure.  3. The mitral valve is normal in structure. No evidence of mitral valve regurgitation. No evidence of mitral stenosis.  4. The aortic valve is tricuspid. There is mild calcification of the aortic valve. There is mild thickening of the aortic valve. Aortic valve regurgitation is not visualized. Aortic valve sclerosis/calcification is present, without any evidence of aortic stenosis.  5. Aortic dilatation noted. There is mild dilatation of the ascending aorta, measuring 40 mm.  6. The inferior vena cava is normal in size with greater than 50% respiratory variability, suggesting right atrial pressure of 3 mmHg. Comparison(s): No prior Echocardiogram. Conclusion(s)/Recommendation(s): Otherwise normal echocardiogram, with minor abnormalities described in the report. Mild dilation of ascending aorta. FINDINGS  Left Ventricle: Left ventricular ejection fraction, by estimation, is 60 to 65%. The left ventricle has normal function. The left ventricle has no regional wall motion abnormalities. The left ventricular internal cavity size was normal in size. There is  no left ventricular hypertrophy. Left ventricular diastolic parameters were normal. Right Ventricle: The right ventricular size is normal. No increase in right ventricular wall thickness. Right ventricular systolic function is normal. There is normal pulmonary artery systolic pressure. The tricuspid regurgitant velocity is 2.54 m/s, and  with an assumed right atrial pressure of 3 mmHg, the estimated right ventricular systolic pressure is 28.8 mmHg. Left Atrium: Left atrial size was normal in size. Right Atrium: Right atrial size was normal in size. Prominent Chiari network.  Pericardium: There is no evidence of pericardial effusion. Mitral Valve: The mitral valve is normal in structure. No evidence of mitral valve regurgitation. No evidence of mitral valve stenosis. Tricuspid Valve: The tricuspid valve is normal in structure. Tricuspid valve regurgitation is mild . No evidence of tricuspid stenosis. Aortic Valve: The aortic valve is tricuspid. There is mild calcification of the aortic valve. There is mild thickening of the aortic valve. Aortic valve regurgitation is not visualized. Aortic valve sclerosis/calcification is present, without any evidence of aortic stenosis. Aortic valve mean gradient measures 4.0 mmHg. Aortic valve peak gradient measures 8.3 mmHg. Aortic valve area, by VTI measures 4.09 cm. Pulmonic Valve: The pulmonic valve was grossly normal. Pulmonic valve regurgitation is trivial. No evidence of pulmonic stenosis. Aorta: Aortic dilatation noted. There is mild dilatation of the ascending  aorta, measuring 40 mm. Venous: The inferior vena cava is normal in size with greater than 50% respiratory variability, suggesting right atrial pressure of 3 mmHg. IAS/Shunts: No atrial level shunt detected by color flow Doppler.  LEFT VENTRICLE PLAX 2D LVIDd:         4.60 cm   Diastology LVIDs:         3.10 cm   LV e' medial:    7.18 cm/s LV PW:         1.10 cm   LV E/e' medial:  6.4 LV IVS:        1.20 cm   LV e' lateral:   16.00 cm/s LVOT diam:     2.40 cm   LV E/e' lateral: 2.8 LV SV:         94 LV SV Index:   51 LVOT Area:     4.52 cm  RIGHT VENTRICLE RV Basal diam:  3.00 cm RV Mid diam:    2.80 cm RV S prime:     13.60 cm/s TAPSE (M-mode): 2.0 cm LEFT ATRIUM             Index        RIGHT ATRIUM           Index LA diam:        3.80 cm 2.05 cm/m   RA Area:     13.10 cm LA Vol (A2C):   34.3 ml 18.52 ml/m  RA Volume:   29.50 ml  15.93 ml/m LA Vol (A4C):   45.1 ml 24.35 ml/m LA Biplane Vol: 40.7 ml 21.98 ml/m  AORTIC VALVE AV Area (Vmax):    3.96 cm AV Area (Vmean):   3.80 cm  AV Area (VTI):     4.09 cm AV Vmax:           144.00 cm/s AV Vmean:          94.700 cm/s AV VTI:            0.230 m AV Peak Grad:      8.3 mmHg AV Mean Grad:      4.0 mmHg LVOT Vmax:         126.00 cm/s LVOT Vmean:        79.600 cm/s LVOT VTI:          0.208 m LVOT/AV VTI ratio: 0.90  AORTA Ao Root diam: 4.00 cm Ao Asc diam:  4.00 cm MITRAL VALVE               TRICUSPID VALVE MV Area (PHT): 2.33 cm    TR Peak grad:   25.8 mmHg MV Decel Time: 325 msec    TR Vmax:        254.00 cm/s MV E velocity: 45.60 cm/s MV A velocity: 81.20 cm/s  SHUNTS MV E/A ratio:  0.56        Systemic VTI:  0.21 m                            Systemic Diam: 2.40 cm Jodelle Red MD Electronically signed by Jodelle Red MD Signature Date/Time: 05/12/2023/5:59:52 PM    Final    DG Chest Portable 1 View  Result Date: 05/11/2023 CLINICAL DATA:  Cough. EXAM: PORTABLE CHEST 1 VIEW COMPARISON:  07/01/2018. FINDINGS: Bilateral lung fields are clear. No consolidation or lung collapse. No pulmonary edema. Bilateral costophrenic angles are clear. Normal cardio-mediastinal silhouette. No acute osseous abnormalities. The  soft tissues are within normal limits. IMPRESSION: *No active disease. Electronically Signed   By: Jules Schick M.D.   On: 05/11/2023 13:47   CT Head Wo Contrast  Result Date: 05/11/2023 CLINICAL DATA:  Headache, new onset (Age >= 51y) EXAM: CT HEAD WITHOUT CONTRAST TECHNIQUE: Contiguous axial images were obtained from the base of the skull through the vertex without intravenous contrast. RADIATION DOSE REDUCTION: This exam was performed according to the departmental dose-optimization program which includes automated exposure control, adjustment of the mA and/or kV according to patient size and/or use of iterative reconstruction technique. COMPARISON:  None Available. FINDINGS: Brain: No evidence of acute infarction, hemorrhage, hydrocephalus, extra-axial collection or mass lesion/mass effect. Vascular: No  hyperdense vessel or unexpected calcification. Skull: Normal. Negative for fracture or focal lesion. Sinuses/Orbits: No middle ear or mastoid effusion. Paranasal sinuses are notable for mucosal thickening of the floor of the right maxillary sinus. Orbits are unremarkable. Other: None. IMPRESSION: No acute intracranial abnormality. Electronically Signed   By: Lorenza Cambridge M.D.   On: 05/11/2023 12:33   '  EKG EKG Interpretation Date/Time:  Monday May 29 2023 01:12:53 EST Ventricular Rate:  65 PR Interval:  182 QRS Duration:  82 QT Interval:  418 QTC Calculation: 435 R Axis:   45  Text Interpretation: Sinus rhythm Abnormal R-wave progression, early transition Confirmed by Nicanor Alcon, Larinda Herter ([id number]) on 05/29/2023 3:29:30 AM  Radiology CT Angio Chest PE W and/or Wo Contrast  Result Date: 05/29/2023 CLINICAL DATA:  Septic arterial embolism. EXAM: CT ANGIOGRAPHY CHEST WITH CONTRAST TECHNIQUE: Multidetector CT imaging of the chest was performed using the standard protocol during bolus administration of intravenous contrast. Multiplanar CT image reconstructions and MIPs were obtained to evaluate the vascular anatomy. RADIATION DOSE REDUCTION: This exam was performed according to the departmental dose-optimization program which includes automated exposure control, adjustment of the mA and/or kV according to patient size and/or use of iterative reconstruction technique. CONTRAST:  75mL OMNIPAQUE IOHEXOL 350 MG/ML SOLN COMPARISON:  05/20/2023 chest CT. FINDINGS: Cardiovascular: Satisfactory opacification of the pulmonary arteries to the segmental level. No evidence of pulmonary embolism. Normal heart size. No pericardial effusion. 3.8 cm diameter ascending aorta. Aortic atherosclerosis. Mediastinum/Nodes: No worrisome lymph node. Lungs/Pleura: Generalized airway thickening since prior. There is airway thickening that is focally marked in the posterior segment of the left upper lobe, largely acute when  compared to recent prior. Patchy areas of ground-glass density in the bilateral lungs, mild. Branching density over the right diaphragm and nodular density over the left diaphragm, a pre-existing nodule measuring approximately 1 cm, three-month follow-up already recommended in the setting of active malignancy. Paraseptal emphysema at the apices. Upper Abdomen: Known hepatic metastatic disease.  No acute finding Musculoskeletal: No emergent finding. Review of the MIP images confirms the above findings. IMPRESSION: 1. Bronchitis with patchy areas of mild bronchopneumonia. 2. Known malignancy with pre-existing pulmonary nodule and recommended follow-up. 3. Atherosclerosis including the coronary arteries. Electronically Signed   By: Tiburcio Pea M.D.   On: 05/29/2023 04:40   DG Chest Portable 1 View  Result Date: 05/29/2023 CLINICAL DATA:  Shortness of breath EXAM: PORTABLE CHEST 1 VIEW COMPARISON:  05/17/2023 FINDINGS: Heart and mediastinal contours are within normal limits. No focal opacities or effusions. No acute bony abnormality. IMPRESSION: No active disease. Electronically Signed   By: Charlett Nose M.D.   On: 05/29/2023 03:39    Procedures Procedures    Medications Ordered in ED Medications  vancomycin (VANCOCIN) IVPB 1000 mg/200 mL premix (has  no administration in time range)  ceFEPIme (MAXIPIME) 2 g in sodium chloride 0.9 % 100 mL IVPB (2 g Intravenous New Bag/Given 05/29/23 0630)  ketorolac (TORADOL) 30 MG/ML injection 30 mg (has no administration in time range)  iohexol (OMNIPAQUE) 350 MG/ML injection 75 mL (75 mLs Intravenous Contrast Given 05/29/23 0416)    ED Course/ Medical Decision Making/ A&P                                 Medical Decision Making Patient with sudden onset SOB this evening, recently diagnosed with cancer   Amount and/or Complexity of Data Reviewed Independent Historian: EMS    Details: See above  External Data Reviewed: labs, radiology and notes.     Details: Previous admission reviewed with labs and imaging  Labs: ordered.    Details: Negative covid and flu, normal white count 5.8, low hemoglobin 9.6, normal platelets.  Normal potassium 4, slight low sodium 133, normal creatinine 1.06, normal troponin 14. BNP normal 57  Radiology: ordered and independent interpretation performed.    Details: HCAP on CTA by me  ECG/medicine tests: ordered and independent interpretation performed. Decision-making details documented in ED Course.  Risk Prescription drug management. Decision regarding hospitalization. Risk Details: Admit for HCAP, blood cultures sent.      Final Clinical Impression(s) / ED Diagnoses Final diagnoses:  HCAP (healthcare-associated pneumonia)  Cancer (HCC)   The patient appears reasonably stabilized for admission considering the current resources, flow, and capabilities available in the ED at this time, and I doubt any other [facility] requiring further screening and/or treatment in the ED prior to admission.  Rx / DC Orders ED Discharge Orders     None         Emika Tiano, MD 05/29/23 [phone]

## 2023-05-29 NOTE — ED Triage Notes (Signed)
Pt BIB EMS from home for headaches for a couple weeks after taking new medications. Today has been worse. Pt has not been consistent w/ eating due to loss of appetite.  Dx liver, stomach, and esophageal cancer  134/96 102 cbg 70s HR

## 2023-05-30 DIAGNOSIS — J189 Pneumonia, unspecified organism: Secondary | ICD-10-CM | POA: Diagnosis not present

## 2023-05-30 MED ORDER — AMOXICILLIN-POT CLAVULANATE 875-125 MG PO TABS: 1.0000 | ORAL_TABLET | Freq: Two times a day (BID) | ORAL | 0 refills | Status: DC

## 2023-05-30 MED ORDER — IBUPROFEN 200 MG PO TABS
600.0000 mg | ORAL_TABLET | Freq: Once | ORAL | Status: AC
Start: 2023-05-30 — End: 2023-05-30
  Administered 2023-05-30: 600 mg via ORAL
  Filled 2023-05-30: qty 3

## 2023-05-30 MED ORDER — LEVOFLOXACIN 750 MG PO TABS: 750.0000 mg | ORAL_TABLET | Freq: Every day | ORAL | 0 refills | Status: AC

## 2023-05-30 NOTE — Telephone Encounter (Signed)
[name], Sister returned call regarding from last week. Informed her that provider did not see significant electrolyte imbalance in recent labs and that he should be okay to continue with medication. [name] states that she meant to ask provider regarding patient blood cell count. With this being low she is worried how medication will react. Especially since patient was just diagnosed with pneumonia and lymphoma.Currently is admitted at [facility]. Is not sure if he will be starting chemo during admission or after. [name] would like to ask if provider feels like they should continue with appt at [facility] regarding abnormal anal pap. Juanita Laster, RMA

## 2023-05-30 NOTE — Progress Notes (Addendum)
   05/30/23 1005  TOC Brief Assessment  Insurance and Status Reviewed  Patient has primary care physician No  Home environment has been reviewed Single family home  Prior level of function: Independent  Prior/Current Home Services  current home services (HHPT/OT w/ [facility])  Social Determinants of Health Reivew SDOH reviewed no interventions necessary  Readmission risk has been reviewed Yes  Transition of care needs no transition of care needs at this time

## 2023-05-30 NOTE — Discharge Summary (Signed)
Physician Discharge Summary  Jordan Calderon NUU:[id number] DOB: 1960-12-15 DOA: 05/29/2023  PCP: Oneita Hurt, No  Admit date: 05/29/2023 Discharge date: 05/30/2023 30 Day Unplanned Readmission Risk Score    Flowsheet Row ED to Hosp-Admission (Discharged) from 05/17/2023 in [facility]  30 Day Unplanned Readmission Risk Score (%) 13.81 Filed at 05/24/2023 1200       This score is the patient's risk of an unplanned readmission within 30 days of being discharged (0 -100%). The score is based on dignosis, age, lab data, medications, orders, and past utilization.   Low:  0-14.9   Medium: 15-21.9   High: 22-29.9   Extreme: 30 and above          Admitted From: Home Disposition: Home  Recommendations for Outpatient Follow-up:  Follow up with PCP in 1-2 weeks Please obtain BMP/CBC in one week Please follow up with your PCP on the following pending results: Unresulted Labs (From admission, onward)    None         Home Health: None Equipment/Devices: None  Discharge Condition: Stable CODE STATUS: Full code Diet recommendation: Cardiac  Following HPI is copied from admitting hospitalist H&P. HPI: Jordan Calderon is a 62 y.o. 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  Patient states that he was doing well overall since he was discharged home on 12/4, has been eating well, ambulating on his own and has no specific complaints.  However, last night when he went to bed he got very short of breath, felt like he could not catch his breath.  Had a little bit of a cough but it was nonproductive.  Denies any chest pain, fevers, chills, nausea or vomiting.  On evaluation in the emergency  department, he is stable on room air, lab work is nonacute.  CT scan of the chest shows evidence of bronchopneumonia, he was given empiric IV vancomycin and IV cefepime and now admitted to the hospitalist service.   Subjective: Seen and examined, other than headache, no complaint.  No shortness of breath.  He is comfortable going home.  Brief/Interim Summary: Patient was briefly hospitalized under hospitalist service for bronchopneumonia and he was started on cefepime for healthcare associated pneumonia due to history of recent hospitalization.  He was resumed rest of the medications.  He did not have leukocytosis or fever.  Did not require any oxygen.  He is feeling better today except headache.  For which he received Tylenol earlier but no relief.  He was then given ibuprofen.  If this will not improve with ibuprofen, we will give him a dose of oxycodone and then discharge him home.  From pneumonia perspective, he is stable.  Discharged on 7 days of Levaquin.  Right nasopharyngeal skull base mass-during his recent hospital stay, he had biopsy of ENT that was nondiagnostic, he had liver biopsy with IR.  Pathology reviewed, shows evidence of high-grade B-cell lymphoma.  Findings discussed with Dr. Arlana Pouch of oncology. -Outpatient follow-up with Dr. Arlana Pouch   HIV-continue Symtuza, dapsone   Thyroidism-Synthroid   GERD with esophagitis, seen on recent endoscopy-Protonix twice daily along with Diflucan   Hyperlipidemia-Crestor  Discharge plan was discussed with patient and/or family member and they verbalized understanding and agreed with it.  Discharge  Diagnoses:  Principal Problem:   HCAP (healthcare-associated pneumonia) Active Problems:   Human immunodeficiency virus (HIV) disease (HCC)   Anxiety state   Polysubstance abuse (HCC)    Discharge Instructions   Allergies as of 05/30/2023       Reactions   Sulfonamide Derivatives Hives, Itching        Medication List     TAKE these  medications    calcium carbonate 500 MG chewable tablet Commonly known as: TUMS - dosed in mg elemental calcium Chew 1 tablet by mouth 3 (three) times daily with meals.   dapsone 100 MG tablet Take 1 tablet (100 mg total) by mouth daily. What changed: when to take this   Darunavir-Cobicistat-Emtricitabine-Tenofovir Alafenamide 800-150-200-10 MG Tabs Commonly known as: SYMTUZA Take 1 tablet by mouth daily with breakfast.   fluconazole 200 MG tablet Commonly known as: DIFLUCAN Take 1 tablet (200 mg total) by mouth daily.   ibuprofen 200 MG tablet Commonly known as: ADVIL Take 200-400 mg by mouth every 6 (six) hours as needed for mild pain (pain score 1-3).   levofloxacin 750 MG tablet Commonly known as: Levaquin Take 1 tablet (750 mg total) by mouth daily for 7 days.   pantoprazole 40 MG tablet Commonly known as: PROTONIX Take 1 tablet (40 mg total) by mouth 2 (two) times daily before a meal.   rosuvastatin 10 MG tablet Commonly known as: CRESTOR Take 1 tablet (10 mg total) by mouth daily.        Follow-up Information     PCP Follow up in 1 week(s).                 Allergies  Allergen Reactions   Sulfonamide Derivatives Hives and Itching    Consultations: None   Procedures/Studies: CT Angio Chest PE W and/or Wo Contrast  Result Date: 05/29/2023 CLINICAL DATA:  Septic arterial embolism. EXAM: CT ANGIOGRAPHY CHEST WITH CONTRAST TECHNIQUE: Multidetector CT imaging of the chest was performed using the standard protocol during bolus administration of intravenous contrast. Multiplanar CT image reconstructions and MIPs were obtained to evaluate the vascular anatomy. RADIATION DOSE REDUCTION: This exam was performed according to the departmental dose-optimization program which includes automated exposure control, adjustment of the mA and/or kV according to patient size and/or use of iterative reconstruction technique. CONTRAST:  75mL OMNIPAQUE IOHEXOL 350 MG/ML SOLN  COMPARISON:  05/20/2023 chest CT. FINDINGS: Cardiovascular: Satisfactory opacification of the pulmonary arteries to the segmental level. No evidence of pulmonary embolism. Normal heart size. No pericardial effusion. 3.8 cm diameter ascending aorta. Aortic atherosclerosis. Mediastinum/Nodes: No worrisome lymph node. Lungs/Pleura: Generalized airway thickening since prior. There is airway thickening that is focally marked in the posterior segment of the left upper lobe, largely acute when compared to recent prior. Patchy areas of ground-glass density in the bilateral lungs, mild. Branching density over the right diaphragm and nodular density over the left diaphragm, a pre-existing nodule measuring approximately 1 cm, three-month follow-up already recommended in the setting of active malignancy. Paraseptal emphysema at the apices. Upper Abdomen: Known hepatic metastatic disease.  No acute finding Musculoskeletal: No emergent finding. Review of the MIP images confirms the above findings. IMPRESSION: 1. Bronchitis with patchy areas of mild bronchopneumonia. 2. Known malignancy with pre-existing pulmonary nodule and recommended follow-up. 3. Atherosclerosis including the coronary arteries. Electronically Signed   By: Tiburcio Pea M.D.   On: 05/29/2023 04:40   DG Chest Portable 1 View  Result Date: 05/29/2023 CLINICAL DATA:  Shortness of breath EXAM: PORTABLE  CHEST 1 VIEW COMPARISON:  05/17/2023 FINDINGS: Heart and mediastinal contours are within normal limits. No focal opacities or effusions. No acute bony abnormality. IMPRESSION: No active disease. Electronically Signed   By: Charlett Nose M.D.   On: 05/29/2023 03:39   DG Swallowing Func-Speech Pathology  Result Date: 05/24/2023 Table formatting from the original result was not included. Modified Barium Swallow Study Patient Details Name: DIAZ MATUSIK MRN: [id number] Date of Birth: 08-06-1960 Today's Date: 05/24/2023 HPI/PMH: HPI: Husayn Cappello is a 62 yo male  who [facility] with lightheadedness, R ear pain, R ptosis, HA, cough, n/v. 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 CT NECK FINDINGS:  Pharynx and larynx: There is a large right nasopharyngeal mass that  extends inferiorly to the level the greater horn of the hyoid bone  on right. This mass involves the right-sided adenoid and palatine  tonsils, the right parapharyngeal space, the right masticator space,  the right parotid space. Superiorly this mass likely extends to the  level of the foramen ovale bilaterally.  CT CHEST showed liver lesions concerning for mets; patulous esophagus. Results of biopsies pending. EGD 12/2: gastric ulcer, severe esophagitis, candidiasis. ENT and oncology following. PMH includes HIV on Hart therapy, HLD, depression, anxiety, AKI. Clinical Impression: Pt demonstrates mild oropharyngeal dysphagia characterized by mild residue in the vallecula and pyriform sinuses. There is appearance of mildly decreased hyoid excursion but very good laryngeal elevation. Pt may have decreased base of tongue retraction and decreased pharyngeal contraction that lead to pharyngeal residuals. The pt clears with an effortful swallow. Residue most significant with thin liquids. There was one instance of trace penetration post swallow with wet vocal quality and throat clear ejecting. Pt recommended to continue foods and drinks of choice. Suggested effortful swallows as a way to clear residue, but also to use as part of an initial home exercise program for pre radiation treatment (potential). Introduced concepts of radiation fibrosis, dysphagia and the importance of continueing swallowing. Pt not yet ready to absorb this and will need further education. Pt to f/u with OP SLP for further education and HEP. Factors that may increase risk of adverse event in presence of aspiration Rubye Oaks & Clearance Coots 2021): No data recorded  Recommendations/Plan: Swallowing Evaluation Recommendations Swallowing Evaluation Recommendations Recommendations: PO diet PO Diet Recommendation: Regular; Thin liquids (Level 0) Liquid Administration via: Cup; Straw Medication Administration: Whole meds with liquid Supervision: Patient able to self-feed Swallowing strategies  : effortful swallow Postural changes: Stay upright 30-60 min after meals Oral care recommendations: Oral care BID (2x/day) Treatment Plan Treatment Plan Treatment recommendations: Defer treatment plan to SLP at other venue (see follow-up recommendations) Follow-up recommendations: Outpatient SLP Recommendations Recommendations for follow up therapy are one component of a multi-disciplinary discharge planning process, led by the attending physician.  Recommendations may be updated based on patient status, additional functional criteria and insurance authorization. Assessment: Orofacial Exam: Orofacial Exam Oral Cavity - Dentition: Adequate natural dentition Anatomy: No data recorded Boluses Administered: Boluses Administered Boluses Administered: Thin liquids (Level 0); Mildly thick liquids (Level 2, nectar thick); Moderately thick liquids (Level 3, honey thick); Puree; Solid  Oral Impairment Domain: Oral Impairment Domain Lip Closure: No labial escape Tongue control during bolus hold: Cohesive bolus between tongue to palatal seal Bolus preparation/mastication: Timely and efficient chewing and mashing Bolus transport/lingual motion: Brisk tongue motion Oral residue: Complete oral clearance Location of oral residue : N/A Initiation of pharyngeal swallow : Posterior laryngeal surface  of the epiglottis  Pharyngeal Impairment Domain: Pharyngeal Impairment Domain Soft palate elevation: No bolus between soft palate (SP)/pharyngeal wall (PW) Laryngeal elevation: Complete superior movement of thyroid cartilage with complete approximation of arytenoids to epiglottic petiole Anterior hyoid excursion:  Partial anterior movement Epiglottic movement: Complete inversion Laryngeal vestibule closure: Complete, no air/contrast in laryngeal vestibule Pharyngeal stripping wave : Present - diminished Pharyngeal contraction (A/P view only): N/A Pharyngoesophageal segment opening: Complete distension and complete duration, no obstruction of flow Tongue base retraction: Trace column of contrast or air between tongue base and PPW Pharyngeal residue: Trace residue within or on pharyngeal structures; Collection of residue within or on pharyngeal structures Location of pharyngeal residue: Valleculae; Pyriform sinuses  Esophageal Impairment Domain: Esophageal Impairment Domain Esophageal clearance upright position: Esophageal retention Pill: Pill Consistency administered: Thin liquids (Level 0) Penetration/Aspiration Scale Score: No data recorded Compensatory Strategies: Compensatory Strategies Compensatory strategies: Yes Effortful swallow: Effective Chin tuck: Ineffective   General Information: Caregiver present: No  Diet Prior to this Study: Regular; Thin liquids (Level 0)   Temperature : Normal   Respiratory Status: WFL   Supplemental O2: None (Room air)   No data recorded Behavior/Cognition: Alert; Cooperative Self-Feeding Abilities: Able to self-feed Baseline vocal quality/speech: Dysphonic Volitional Cough: Able to elicit Volitional Swallow: Able to elicit No data recorded Goal Planning: No data recorded No data recorded No data recorded No data recorded No data recorded Pain: No data recorded End of Session: Start Time:SLP Start Time (ACUTE ONLY): 1115 Stop Time: SLP Stop Time (ACUTE ONLY): 1136 Time Calculation:SLP Time Calculation (min) (ACUTE ONLY): 21 min Charges: SLP Evaluations $ SLP Speech Visit: 1 Visit SLP Evaluations $BSS Swallow: 1 Procedure $MBS Swallow: 1 Procedure $Swallowing Treatment: 1 Procedure SLP visit diagnosis: SLP Visit Diagnosis: Dysphagia, oropharyngeal phase (R13.12) Past Medical History: Past  Medical History: Diagnosis Date  Anxiety   Cellulitis 02/11/2014  LEFT ARM  Depression   Headache(784.0)   HIV (human immunodeficiency virus infection) (HCC)  Past Surgical History: Past Surgical History: Procedure Laterality Date  ARM HARDWARE REMOVAL Left   arm surgery    HEMORRHOID SURGERY    NASOPHARYNGEAL BIOPSY N/A 05/19/2023  Procedure: NASOPHARYNGEAL BIOPSY;  Surgeon: Newman Pies, MD;  Location: [facility] OR;  Service: ENT;  Laterality: N/A; [place], Riley Nearing 05/24/2023, 12:22 PM  CT ABDOMEN PELVIS W CONTRAST  Result Date: 05/23/2023 CLINICAL DATA:  Abnormal neck and chest CT, nasopharyngeal mass EXAM: CT ABDOMEN AND PELVIS WITH CONTRAST TECHNIQUE: Multidetector CT imaging of the abdomen and pelvis was performed using the standard protocol following bolus administration of intravenous contrast. RADIATION DOSE REDUCTION: This exam was performed according to the departmental dose-optimization program which includes automated exposure control, adjustment of the mA and/or kV according to patient size and/or use of iterative reconstruction technique. CONTRAST:  75mL OMNIPAQUE IOHEXOL 350 MG/ML SOLN COMPARISON:  CT 05/20/2023 FINDINGS: Lower chest: See separately dictated recently performed chest CT. Left lung base pulmonary nodule measuring 6 mm on series 4, image 21. No acute airspace disease. Hepatobiliary: Multiple hypodense liver masses concerning for metastatic disease. Index right hepatic lobe mass lesion measures 2.8 x 2.3 cm on series 3, image 22. Index left hepatic lobe lesion measures 2.4 x 2.6 cm on series 3, image 31. Numerous additional hypodense liver masses. No calcified gallstone or biliary dilatation. Pancreas: Unremarkable. No pancreatic ductal dilatation or surrounding inflammatory changes. Spleen: Normal in size without focal abnormality. Adrenals/Urinary Tract: Adrenal glands are normal. Kidneys show no hydronephrosis. The bladder is unremarkable Stomach/Bowel: Stomach nonenlarged. Focal  wall  thickening of the mid gastric lumen, series 3, image 36 and coronal series 6, image 77. On sagittal views, ulcer crater at the lesser curvature of stomach, series 7, image 87 and 88. No small bowel distension. No acute bowel wall thickening. Vascular/Lymphatic: Nonaneurysmal aorta with mild atherosclerosis. Mildly enlarged gastrohepatic lymph nodes measuring 14 x 14 mm on series 3, image 27. Negative for retroperitoneal or pelvic adenopathy. Reproductive: Prostate is unremarkable. Other: Negative for pelvic effusion or free air for small fat containing inguinal hernias. Musculoskeletal: No acute or suspicious osseous abnormality. IMPRESSION: 1. Multiple hypodense liver masses concerning for metastatic disease. Mildly enlarged gastrohepatic lymph nodes also suspicious for metastatic disease. 2. Focal wall thickening of the mid gastric lumen with ulcer crater at the lesser curvature of stomach. Correlate with endoscopy if not already performed. 3. Left lung base pulmonary nodule measuring 6 mm. See separately dictated recently performed chest CT. 4. Aortic atherosclerosis. Aortic Atherosclerosis (ICD10-I70.0). Electronically Signed   By: Jasmine Pang M.D.   On: 05/23/2023 17:52   US BIOPSY (LIVER)  Result Date: 05/23/2023 INDICATION: 62 year old with multiple liver lesions. Findings are suggestive for metastatic disease. Diagnosis is needed. EXAM: ULTRASOUND-GUIDED LIVER LESION BIOPSY MEDICATIONS: Moderate sedation ANESTHESIA/SEDATION: Moderate (conscious) sedation was employed during this procedure. A total of Versed 1 mg and Fentanyl 25 mcg was administered intravenously by the radiology nurse. Total intra-service moderate Sedation Time: 18 minutes. The patient's level of consciousness and vital signs were monitored continuously by radiology nursing throughout the procedure under my direct supervision. FLUOROSCOPY TIME:  None COMPLICATIONS: None immediate. PROCEDURE: Informed written consent was obtained from  the patient after a thorough discussion of the procedural risks, benefits and alternatives. All questions were addressed. Maximal Sterile Barrier Technique was utilized including caps, mask, sterile gowns, sterile gloves, sterile drape, hand hygiene and skin antiseptic. A timeout was performed prior to the initiation of the procedure. Abdomen was evaluated with ultrasound. A right hepatic lesion was targeted. The right side of the abdomen was prepped with chlorhexidine and sterile field was created. Skin was anesthetized using 1% lidocaine. Small incision was made. Using ultrasound guidance, 17 gauge coaxial needle was directed into a right hepatic lesion. Total of 3 core biopsies were performed with an 18 gauge core device. Three adequate specimens obtained and placed in formalin. 17 gauge coaxial needle was removed without complication. Bandage placed over the puncture site. FINDINGS: Several hyperechoic lesions scattered throughout the liver. A lesion in the right hepatic lobe was targeted. Biopsy needle confirmed within the lesion. Three adequate specimens obtained. No immediate bleeding or hematoma formation. IMPRESSION: Successful ultrasound-guided core biopsies from a right hepatic lesion. Electronically Signed   By: Richarda Overlie M.D.   On: 05/23/2023 10:22   CT CHEST W CONTRAST  Result Date: 05/20/2023 CLINICAL DATA:  Staging colon cancer. New nasopharyngeal mass. * Tracking Code: BO * EXAM: CT CHEST WITH CONTRAST TECHNIQUE: Multidetector CT imaging of the chest was performed during intravenous contrast administration. RADIATION DOSE REDUCTION: This exam was performed according to the departmental dose-optimization program which includes automated exposure control, adjustment of the mA and/or kV according to patient size and/or use of iterative reconstruction technique. CONTRAST:  50mL OMNIPAQUE IOHEXOL 350 MG/ML SOLN COMPARISON:  None Available. FINDINGS: Cardiovascular: Heart is nonenlarged. Coronary  artery calcifications are seen. Please correlate for other coronary risk factors. Diameter of the ascending aorta measures 4.4 by 4.4 cm at the level of the right pulmonary artery. More normal caliber arch and descending thoracic aorta.  Mild vascular calcifications along the aorta. Mediastinum/Nodes: Preserved thyroid gland. Slightly patulous thoracic esophagus with some luminal air and fluid. No specific abnormal lymph node enlargement present in the axillary region, hilum or mediastinum. A few small less than 1 cm size mediastinal and right hilar nodes are seen, nonpathologic by size criteria. Lungs/Pleura: No consolidation, pneumothorax or effusion. Slight breathing motion. Left lower lobe lung nodule identified measuring 8 by 3 mm on series 4, image 136. Similar focus in the right lung base measuring 7 mm on series 4 image 123. Few other tiny nodules in the right lung such as series 4, image 119 in the lower lobe. Few patchy areas of ground-glass type areas are seen as well. Apical pleural thickening identified with some paraseptal and centrilobular emphysematous changes. Some areas of interstitial septal thickening as well. Upper Abdomen: Multiple liver masses are identified. At least 15 lesions are seen. Example in segment 2 on series 3, image 147 measures 2.7 by 2.6 cm. Middle lobe lesion on series 3, image 174 measures 2.7 by 2.7 cm. Adrenal glands are preserved. There also abnormal nodes identified such as lesser curve of the stomach measuring 16 x 20 mm. Musculoskeletal: Mild degenerative changes along the spine. IMPRESSION: Numerous liver metastases. Upper abdominal abnormal nodes. Further workup of the abdomen and pelvis may be useful when clinically appropriate if there is no known previous. Patulous esophagus with slight wall thickening. Please correlate with symptoms and additional evaluation when clinically appropriate. Small mediastinal lymph nodes.  Attention on follow-up. Small lung nodules  measuring up to 8 mm. Recommend follow up in 3 months or as per the patient's primary neoplasm. Coronary artery calcifications. In addition dilatation of the ascending aorta. Recommend annual imaging followup by CTA or MRA. This recommendation follows 2010 ACCF/AHA/AATS/ACR/ASA/SCA/SCAI/SIR/STS/SVM Guidelines for the Diagnosis and Management of Patients with Thoracic Aortic Disease. Circulation. 2010; 121: Z610-R604. Aortic aneurysm NOS (ICD10-I71.9) Aortic Atherosclerosis (ICD10-I70.0) and Emphysema (ICD10-J43.9). Electronically Signed   By: Karen Kays M.D.   On: 05/20/2023 14:09   CT SOFT TISSUE NECK W CONTRAST  Result Date: 05/18/2023 CLINICAL DATA:  Neck mass EXAM: CT NECK WITH CONTRAST TECHNIQUE: Multidetector CT imaging of the neck was performed using the standard protocol following the bolus administration of intravenous contrast. RADIATION DOSE REDUCTION: This exam was performed according to the departmental dose-optimization program which includes automated exposure control, adjustment of the mA and/or kV according to patient size and/or use of iterative reconstruction technique. CONTRAST:  75mL OMNIPAQUE IOHEXOL 350 MG/ML SOLN COMPARISON:  CTA head/neck 05/17/23 FINDINGS: Pharynx and larynx: There is a large right nasopharyngeal mass that extends inferiorly to the level the greater horn of the hyoid bone on right. This mass involves the right-sided adenoid and palatine tonsils, the right parapharyngeal space, the right masticator space, the right parotid space. Superiorly this mass likely extends to the level of the foramen ovale bilaterally. Salivary glands: The lateral extension of the large nasopharyngeal mass abuts the right parotid gland in the superior aspect of the right submandibular gland. There is a 9 mm hypodense lesion in the left parotid gland. Thyroid: Normal. Lymph nodes: There is a 9 mm hypodense lesion in the left parotid gland (series 2, image 46). It is unclear if this represents  a necrotic lymph node or an intraparotid cyst. Vascular: Negative. Limited intracranial: Negative. Visualized orbits: Negative. Mastoids and visualized paranasal sinuses: No middle ear or mastoid effusion. Paranasal sinuses are clear. Skeleton: No acute or aggressive process. Upper chest: Negative. Other: None IMPRESSION:  1. Large right nasopharyngeal mass that extends inferiorly to the level the greater horn of the hyoid bone on the right. This mass involves the right-sided adenoid and palatine tonsils, the right parapharyngeal space, the right masticator space, the right parotid space. Superiorly this mass likely extends to the level of the foramen ovale bilaterally. 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 Recommend ENT consultation. 2. A 9 mm hypodense lesion in the left parotid gland. It is unclear if this represents a necrotic lymph node or an intraparotid cyst. Electronically Signed   By: Lorenza Cambridge M.D.   On: 05/18/2023 11:31   MR ANGIO HEAD WO CONTRAST  Result Date: 05/17/2023 CLINICAL DATA:  Initial evaluation for persistent dizziness. EXAM: MRA HEAD WITHOUT CONTRAST TECHNIQUE: Angiographic images of the Circle of Willis were acquired using MRA technique without intravenous contrast. COMPARISON:  CTA from earlier the same day. FINDINGS: Anterior circulation: Both internal carotid arteries are widely patent through the siphons without stenosis or other abnormality. Again seen is a 3 mm outpouching extending posteriorly and laterally from the left ICA terminus (series 2, image 93). Possible small vessel emanating from its apex, not entirely certain. Finding could reflect an vascular infundibulum versus aneurysm. A1 segments patent bilaterally, with the right dominant. Normal anterior communicating artery complex. Anterior cerebral arteries patent without stenosis. No M1 stenosis or occlusion. Distal MCA branches perfused and symmetric. Posterior circulation: Visualized V4  segments patent without stenosis. Right PICA patent. Left PICA origin not seen. Basilar patent without stenosis. Superior cerebellar and posterior cerebral arteries widely patent bilaterally. Anatomic variants: As above. Other: Previously identified mass at the right skull base noted, partially visualized, better evaluated on prior CT. IMPRESSION: 1. 3 mm outpouching extending posteriorly and laterally from the left ICA terminus, potentially reflecting an vascular infundibulum versus aneurysm. Attention at follow-up recommended. 2. Otherwise normal intracranial MRA. No large vessel occlusion or other emergent finding. Electronically Signed   By: Rise Mu M.D.   On: 05/17/2023 20:58   CT ANGIO HEAD NECK W WO CM  Result Date: 05/17/2023 CLINICAL DATA:  Cerebral vasospasm suspected EXAM: CT ANGIOGRAPHY HEAD AND NECK WITH AND WITHOUT CONTRAST TECHNIQUE: Multidetector CT imaging of the head and neck was performed using the standard protocol during bolus administration of intravenous contrast. Multiplanar CT image reconstructions and MIPs were obtained to evaluate the vascular anatomy. Carotid stenosis measurements (when applicable) are obtained utilizing NASCET criteria, using the distal internal carotid diameter as the denominator. RADIATION DOSE REDUCTION: This exam was performed according to the departmental dose-optimization program which includes automated exposure control, adjustment of the mA and/or kV according to patient size and/or use of iterative reconstruction technique. CONTRAST:  75mL OMNIPAQUE IOHEXOL 350 MG/ML SOLN COMPARISON:  None Available. FINDINGS: CT HEAD FINDINGS Brain: No evidence of acute infarction, hemorrhage, hydrocephalus, extra-axial collection or intraparenchymal mass lesion/mass effect. Vascular: See below. Skull: No acute fracture. Sinuses/Orbits: Clear sinuses.  No acute orbital findings. Other: No mastoid effusions. Review of the MIP images confirms the above findings  CTA NECK FINDINGS Aortic arch: Great vessel origins are patent without significant stenosis. Right carotid system: No evidence of dissection, stenosis (50% or greater), or occlusion. Left carotid system: No evidence of dissection, stenosis (50% or greater), or occlusion. Mild atherosclerosis at the carotid bifurcation. Vertebral arteries: Codominant. No evidence of dissection, stenosis (50% or greater), or occlusion. Skeleton: No acute abnormality on limited assessment. 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  Review of the MIP images confirms the above findings CTA HEAD FINDINGS Anterior circulation: Bilateral intracranial ICAs, MCAs, and ACAs are patent without proximal hemodynamically significant stenosis. Hypoplastic left A1 ACA, likely congenital. Approximately 2 mm posteriorly inferiorly directed outpouching arising from the left carotid terminus (for example see series 605, image 111 and series 604, image 154 Posterior circulation: Tortuous vertebral arteries. The vertebral arteries, basilar artery and bilateral posterior cerebral arteries are patent without proximal hemodynamically significant stenosis. Venous sinuses: As permitted by contrast timing, patent. Anatomic variants: Described above. Review of the MIP images confirms the above findings IMPRESSION: 1. 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 Recommend ENT consultation, correlation with direct inspection, and follow-up dedicated CT of the neck with contrast for better characterization. 2. No emergent large vessel occlusion or proximal hemodynamically significant stenosis. 3. Approximately 2 mm posteriorly inferiorly directed outpouching arising from the left carotid terminus which could represent an infundibulum with vessel not well seen versus aneurysm. 4.  Aortic Atherosclerosis (ICD10-I70.0) and Emphysema (ICD10-J43.9). Electronically Signed   By: Feliberto Harts M.D.   On: 05/17/2023 14:55   DG Chest Port 1 View  Result Date: 05/17/2023 CLINICAL DATA:  Worsening cough and dry heaving with shortness of breath EXAM: PORTABLE CHEST 1 VIEW COMPARISON:  Chest radiograph dated 05/11/2023 FINDINGS: Normal lung volumes. No focal consolidations. No pleural effusion or pneumothorax. The heart size and mediastinal contours are within normal limits. No acute osseous abnormality. IMPRESSION: No active disease. Electronically Signed   By: Agustin Cree M.D.   On: 05/17/2023 14:04   ECHOCARDIOGRAM COMPLETE  Result Date: 05/12/2023    ECHOCARDIOGRAM REPORT   Patient Name:   HUGHSTON ROSES Brickner Date of Exam: 05/12/2023 Medical Rec #:  [id number]       Height:       69.0 in Accession #:    [id number]      Weight:       154.6 lb Date of Birth:  [birth year]       BSA:          1.852 m Patient Age:    62 years        BP:           124/67 mmHg Patient Gender: M               HR:           73 bpm. Exam Location:  Inpatient Procedure: 2D Echo, Color Doppler and Cardiac Doppler Indications:    Syncope  History:        Patient has no prior history of Echocardiogram examinations.                 Signs/Symptoms:Syncope; Risk Factors:HIV, Polysubstance Abuse                 and Former Smoker.  Sonographer:    Milbert Coulter Referring Phys: [id number] JARED M GARDNER IMPRESSIONS  1. Left ventricular ejection fraction, by estimation, is 60 to 65%. The left ventricle has normal function. The left ventricle has no regional wall motion abnormalities. Left ventricular diastolic parameters were normal.  2. Right ventricular systolic function is normal. The right ventricular size is normal. There is normal pulmonary artery systolic pressure.  3. The mitral valve is normal in structure. No evidence of mitral valve regurgitation. No evidence of mitral stenosis.  4. The aortic valve is tricuspid. There is mild  calcification of the aortic valve.  There is mild thickening of the aortic valve. Aortic valve regurgitation is not visualized. Aortic valve sclerosis/calcification is present, without any evidence of aortic stenosis.  5. Aortic dilatation noted. There is mild dilatation of the ascending aorta, measuring 40 mm.  6. The inferior vena cava is normal in size with greater than 50% respiratory variability, suggesting right atrial pressure of 3 mmHg. Comparison(s): No prior Echocardiogram. Conclusion(s)/Recommendation(s): Otherwise normal echocardiogram, with minor abnormalities described in the report. Mild dilation of ascending aorta. FINDINGS  Left Ventricle: Left ventricular ejection fraction, by estimation, is 60 to 65%. The left ventricle has normal function. The left ventricle has no regional wall motion abnormalities. The left ventricular internal cavity size was normal in size. There is  no left ventricular hypertrophy. Left ventricular diastolic parameters were normal. Right Ventricle: The right ventricular size is normal. No increase in right ventricular wall thickness. Right ventricular systolic function is normal. There is normal pulmonary artery systolic pressure. The tricuspid regurgitant velocity is 2.54 m/s, and  with an assumed right atrial pressure of 3 mmHg, the estimated right ventricular systolic pressure is 28.8 mmHg. Left Atrium: Left atrial size was normal in size. Right Atrium: Right atrial size was normal in size. Prominent Chiari network. Pericardium: There is no evidence of pericardial effusion. Mitral Valve: The mitral valve is normal in structure. No evidence of mitral valve regurgitation. No evidence of mitral valve stenosis. Tricuspid Valve: The tricuspid valve is normal in structure. Tricuspid valve regurgitation is mild . No evidence of tricuspid stenosis. Aortic Valve: The aortic valve is tricuspid. There is mild calcification of the aortic valve. There is mild thickening of the aortic  valve. Aortic valve regurgitation is not visualized. Aortic valve sclerosis/calcification is present, without any evidence of aortic stenosis. Aortic valve mean gradient measures 4.0 mmHg. Aortic valve peak gradient measures 8.3 mmHg. Aortic valve area, by VTI measures 4.09 cm. Pulmonic Valve: The pulmonic valve was grossly normal. Pulmonic valve regurgitation is trivial. No evidence of pulmonic stenosis. Aorta: Aortic dilatation noted. There is mild dilatation of the ascending aorta, measuring 40 mm. Venous: The inferior vena cava is normal in size with greater than 50% respiratory variability, suggesting right atrial pressure of 3 mmHg. IAS/Shunts: No atrial level shunt detected by color flow Doppler.  LEFT VENTRICLE PLAX 2D LVIDd:         4.60 cm   Diastology LVIDs:         3.10 cm   LV e' medial:    7.18 cm/s LV PW:         1.10 cm   LV E/e' medial:  6.4 LV IVS:        1.20 cm   LV e' lateral:   16.00 cm/s LVOT diam:     2.40 cm   LV E/e' lateral: 2.8 LV SV:         94 LV SV Index:   51 LVOT Area:     4.52 cm  RIGHT VENTRICLE RV Basal diam:  3.00 cm RV Mid diam:    2.80 cm RV S prime:     13.60 cm/s TAPSE (M-mode): 2.0 cm LEFT ATRIUM             Index        RIGHT ATRIUM           Index LA diam:        3.80 cm 2.05 cm/m   RA Area:     13.10 cm LA Vol (A2C):  34.3 ml 18.52 ml/m  RA Volume:   29.50 ml  15.93 ml/m LA Vol (A4C):   45.1 ml 24.35 ml/m LA Biplane Vol: 40.7 ml 21.98 ml/m  AORTIC VALVE AV Area (Vmax):    3.96 cm AV Area (Vmean):   3.80 cm AV Area (VTI):     4.09 cm AV Vmax:           144.00 cm/s AV Vmean:          94.700 cm/s AV VTI:            0.230 m AV Peak Grad:      8.3 mmHg AV Mean Grad:      4.0 mmHg LVOT Vmax:         126.00 cm/s LVOT Vmean:        79.600 cm/s LVOT VTI:          0.208 m LVOT/AV VTI ratio: 0.90  AORTA Ao Root diam: 4.00 cm Ao Asc diam:  4.00 cm MITRAL VALVE               TRICUSPID VALVE MV Area (PHT): 2.33 cm    TR Peak grad:   25.8 mmHg MV Decel Time: 325 msec    TR  Vmax:        254.00 cm/s MV E velocity: 45.60 cm/s MV A velocity: 81.20 cm/s  SHUNTS MV E/A ratio:  0.56        Systemic VTI:  0.21 m                            Systemic Diam: 2.40 cm Jodelle Red MD Electronically signed by Jodelle Red MD Signature Date/Time: 05/12/2023/5:59:52 PM    Final    DG Chest Portable 1 View  Result Date: 05/11/2023 CLINICAL DATA:  Cough. EXAM: PORTABLE CHEST 1 VIEW COMPARISON:  07/01/2018. FINDINGS: Bilateral lung fields are clear. No consolidation or lung collapse. No pulmonary edema. Bilateral costophrenic angles are clear. Normal cardio-mediastinal silhouette. No acute osseous abnormalities. The soft tissues are within normal limits. IMPRESSION: *No active disease. Electronically Signed   By: Jules Schick M.D.   On: 05/11/2023 13:47   CT Head Wo Contrast  Result Date: 05/11/2023 CLINICAL DATA:  Headache, new onset (Age >= 51y) EXAM: CT HEAD WITHOUT CONTRAST TECHNIQUE: Contiguous axial images were obtained from the base of the skull through the vertex without intravenous contrast. RADIATION DOSE REDUCTION: This exam was performed according to the departmental dose-optimization program which includes automated exposure control, adjustment of the mA and/or kV according to patient size and/or use of iterative reconstruction technique. COMPARISON:  None Available. FINDINGS: Brain: No evidence of acute infarction, hemorrhage, hydrocephalus, extra-axial collection or mass lesion/mass effect. Vascular: No hyperdense vessel or unexpected calcification. Skull: Normal. Negative for fracture or focal lesion. Sinuses/Orbits: No middle ear or mastoid effusion. Paranasal sinuses are notable for mucosal thickening of the floor of the right maxillary sinus. Orbits are unremarkable. Other: None. IMPRESSION: No acute intracranial abnormality. Electronically Signed   By: Lorenza Cambridge M.D.   On: 05/11/2023 12:33     Discharge Exam: Vitals:   05/29/23 2213 05/30/23 0541   BP: 121/78 109/87  Pulse: 83 66  Resp: 18 18  Temp: 98.4 F (36.9 C) 98.3 F (36.8 C)  SpO2: 98% 97%   Vitals:   05/29/23 1400 05/29/23 1738 05/29/23 2213 05/30/23 0541  BP: (!) 138/91 132/83 121/78 109/87  Pulse:  86 83 66  Resp: 18  18 18   Temp: 98.4 F (36.9 C) 98.7 F (37.1 C) 98.4 F (36.9 C) 98.3 F (36.8 C)  TempSrc: Oral Oral Oral Oral  SpO2:  96% 98% 97%  Weight:      Height:        General: Pt is alert, awake, not in acute distress Cardiovascular: RRR, S1/S2 +, no rubs, no gallops Respiratory: CTA bilaterally, no wheezing, no rhonchi Abdominal: Soft, NT, ND, bowel sounds + Extremities: no edema, no cyanosis    The results of significant diagnostics from this hospitalization (including imaging, microbiology, ancillary and laboratory) are listed below for reference.     Microbiology: Recent Results (from the past 240 hour(s))  Resp panel by RT-PCR (RSV, Flu A&B, Covid) Anterior Nasal Swab     Status: None   Collection Time: 05/29/23  2:33 AM   Specimen: Anterior Nasal Swab  Result Value Ref Range Status   SARS Coronavirus 2 by RT PCR NEGATIVE NEGATIVE Final    Comment: (NOTE) SARS-CoV-2 target nucleic acids are NOT DETECTED.  The SARS-CoV-2 RNA is generally detectable in upper respiratory specimens during the acute phase of infection. The lowest concentration of SARS-CoV-2 viral copies this assay can detect is 138 copies/mL. A negative result does not preclude SARS-Cov-2 infection and should not be used as the sole basis for treatment or other patient management decisions. A negative result may occur with  improper specimen collection/handling, submission of specimen other than nasopharyngeal swab, presence of viral mutation(s) within the areas targeted by this assay, and inadequate number of viral copies(<138 copies/mL). A negative result must be combined with clinical observations, patient history, and epidemiological information. The expected  result is Negative.  Fact Sheet for Patients:  BloggerCourse.com  Fact Sheet for Healthcare Providers:  SeriousBroker.it  This test is no t yet approved or cleared by the [place] FDA and  has been authorized for detection and/or diagnosis of SARS-CoV-2 by FDA under an Emergency Use Authorization (EUA). This EUA will remain  in effect (meaning this test can be used) for the duration of the COVID-19 declaration under Section 564(b)(1) of the Act, 21 U.S.C.section 360bbb-3(b)(1), unless the authorization is terminated  or revoked sooner.       Influenza A by PCR NEGATIVE NEGATIVE Final   Influenza B by PCR NEGATIVE NEGATIVE Final    Comment: (NOTE) The Xpert Xpress SARS-CoV-2/FLU/RSV plus assay is intended as an aid in the diagnosis of influenza from Nasopharyngeal swab specimens and should not be used as a sole basis for treatment. Nasal washings and aspirates are unacceptable for Xpert Xpress SARS-CoV-2/FLU/RSV testing.  Fact Sheet for Patients: BloggerCourse.com  Fact Sheet for Healthcare Providers: SeriousBroker.it  This test is not yet approved or cleared by the [place] FDA and has been authorized for detection and/or diagnosis of SARS-CoV-2 by FDA under an Emergency Use Authorization (EUA). This EUA will remain in effect (meaning this test can be used) for the duration of the COVID-19 declaration under Section 564(b)(1) of the Act, 21 U.S.C. section 360bbb-3(b)(1), unless the authorization is terminated or revoked.     Resp Syncytial Virus by PCR NEGATIVE NEGATIVE Final    Comment: (NOTE) Fact Sheet for Patients: BloggerCourse.com  Fact Sheet for Healthcare Providers: SeriousBroker.it  This test is not yet approved or cleared by the [place] FDA and has been authorized for detection and/or diagnosis of  SARS-CoV-2 by FDA under an Emergency Use Authorization (EUA). This EUA will remain in effect (meaning this test can  be used) for the duration of the COVID-19 declaration under Section 564(b)(1) of the Act, 21 U.S.C. section 360bbb-3(b)(1), unless the authorization is terminated or revoked.  Performed at [facility], [address] [place], [place] [address]   Blood culture (routine x 2)     Status: None (Preliminary result)   Collection Time: 05/29/23  6:34 AM   Specimen: BLOOD  Result Value Ref Range Status   Specimen Description   Final    BLOOD SITE NOT SPECIFIED Performed at [facility], [address], [place], [place] [address]    Special Requests   Final    BOTTLES DRAWN AEROBIC AND ANAEROBIC Blood Culture adequate volume Performed at [facility], [address], [place], [place] [address]    Culture   Final    NO GROWTH < 24 HOURS Performed at [facility], [address], [place], [place] [address]    Report Status PENDING  Incomplete  Blood culture (routine x 2)     Status: None (Preliminary result)   Collection Time: 05/29/23 11:06 AM   Specimen: BLOOD  Result Value Ref Range Status   Specimen Description   Final    BLOOD BLOOD RIGHT ARM Performed at [facility], [address] [place], [place] [address]    Special Requests   Final    BOTTLES DRAWN AEROBIC AND ANAEROBIC Blood Culture results may not be optimal due to an inadequate volume of blood received in culture bottles Performed at [facility], [address], [place], [place] [address]    Culture   Final    NO GROWTH < 24 HOURS Performed at [facility], [address] [place], [place] [address]    Report Status PENDING  Incomplete     Labs: BNP (last 3 results) Recent Labs    05/29/23 0233  BNP 57.9   Basic Metabolic Panel: Recent Labs  Lab 05/24/23 0537 05/29/23 0235  NA 133*  133*  K 4.7 4.0  CL 101 105  CO2 25 22  GLUCOSE 102* 97  BUN 23 23  CREATININE 1.24 1.06  CALCIUM 8.7* 8.5*   Liver Function Tests: Recent Labs  Lab 05/29/23 0235  AST 24  ALT 32  ALKPHOS 90  BILITOT 0.3  PROT 7.6  ALBUMIN 3.4*   No results for input(s): "LIPASE", "AMYLASE" in the last 168 hours. No results for input(s): "AMMONIA" in the last 168 hours. CBC: Recent Labs  Lab 05/24/23 0537 05/29/23 0235  WBC 7.6 5.8  NEUTROABS  --  3.4  HGB 10.2* 9.6*  HCT 31.5* 31.0*  MCV 98.7 102.6*  PLT 276 203   Cardiac Enzymes: No results for input(s): "CKTOTAL", "CKMB", "CKMBINDEX", "TROPONINI" in the last 168 hours. BNP: Invalid input(s): "POCBNP" CBG: No results for input(s): "GLUCAP" in the last 168 hours. D-Dimer No results for input(s): "DDIMER" in the last 72 hours. Hgb A1c No results for input(s): "HGBA1C" in the last 72 hours. Lipid Profile No results for input(s): "CHOL", "HDL", "LDLCALC", "TRIG", "CHOLHDL", "LDLDIRECT" in the last 72 hours. Thyroid function studies No results for input(s): "TSH", "T4TOTAL", "T3FREE", "THYROIDAB" in the last 72 hours.  Invalid input(s): "FREET3" Anemia work up No results for input(s): "VITAMINB12", "FOLATE", "FERRITIN", "TIBC", "IRON", "RETICCTPCT" in the last 72 hours. Urinalysis    Component Value Date/Time   COLORURINE YELLOW 07/01/2018 1032   APPEARANCEUR CLEAR 07/01/2018 1032   LABSPEC 1.010 07/01/2018 1032   PHURINE  6.5 07/01/2018 1032   GLUCOSEU NEGATIVE 07/01/2018 1032   HGBUR NEGATIVE 07/01/2018 1032   BILIRUBINUR NEGATIVE 07/01/2018 1032   KETONESUR NEGATIVE 07/01/2018 1032   PROTEINUR NEGATIVE 07/01/2018 1032   UROBILINOGEN 0.2 02/10/2014 1830   NITRITE NEGATIVE 07/01/2018 1032   LEUKOCYTESUR NEGATIVE 07/01/2018 1032   Sepsis Labs Recent Labs  Lab 05/24/23 0537 05/29/23 0235  WBC 7.6 5.8   Microbiology Recent Results (from the past 240 hour(s))  Resp panel by RT-PCR (RSV, Flu A&B, Covid) Anterior  Nasal Swab     Status: None   Collection Time: 05/29/23  2:33 AM   Specimen: Anterior Nasal Swab  Result Value Ref Range Status   SARS Coronavirus 2 by RT PCR NEGATIVE NEGATIVE Final    Comment: (NOTE) SARS-CoV-2 target nucleic acids are NOT DETECTED.  The SARS-CoV-2 RNA is generally detectable in upper respiratory specimens during the acute phase of infection. The lowest concentration of SARS-CoV-2 viral copies this assay can detect is 138 copies/mL. A negative result does not preclude SARS-Cov-2 infection and should not be used as the sole basis for treatment or other patient management decisions. A negative result may occur with  improper specimen collection/handling, submission of specimen other than nasopharyngeal swab, presence of viral mutation(s) within the areas targeted by this assay, and inadequate number of viral copies(<138 copies/mL). A negative result must be combined with clinical observations, patient history, and epidemiological information. The expected result is Negative.  Fact Sheet for Patients:  BloggerCourse.com  Fact Sheet for Healthcare Providers:  SeriousBroker.it  This test is no t yet approved or cleared by the [place] FDA and  has been authorized for detection and/or diagnosis of SARS-CoV-2 by FDA under an Emergency Use Authorization (EUA). This EUA will remain  in effect (meaning this test can be used) for the duration of the COVID-19 declaration under Section 564(b)(1) of the Act, 21 U.S.C.section 360bbb-3(b)(1), unless the authorization is terminated  or revoked sooner.       Influenza A by PCR NEGATIVE NEGATIVE Final   Influenza B by PCR NEGATIVE NEGATIVE Final    Comment: (NOTE) The Xpert Xpress SARS-CoV-2/FLU/RSV plus assay is intended as an aid in the diagnosis of influenza from Nasopharyngeal swab specimens and should not be used as a sole basis for treatment. Nasal washings  and aspirates are unacceptable for Xpert Xpress SARS-CoV-2/FLU/RSV testing.  Fact Sheet for Patients: BloggerCourse.com  Fact Sheet for Healthcare Providers: SeriousBroker.it  This test is not yet approved or cleared by the [place] FDA and has been authorized for detection and/or diagnosis of SARS-CoV-2 by FDA under an Emergency Use Authorization (EUA). This EUA will remain in effect (meaning this test can be used) for the duration of the COVID-19 declaration under Section 564(b)(1) of the Act, 21 U.S.C. section 360bbb-3(b)(1), unless the authorization is terminated or revoked.     Resp Syncytial Virus by PCR NEGATIVE NEGATIVE Final    Comment: (NOTE) Fact Sheet for Patients: BloggerCourse.com  Fact Sheet for Healthcare Providers: SeriousBroker.it  This test is not yet approved or cleared by the [place] FDA and has been authorized for detection and/or diagnosis of SARS-CoV-2 by FDA under an Emergency Use Authorization (EUA). This EUA will remain in effect (meaning this test can be used) for the duration of the COVID-19 declaration under Section 564(b)(1) of the Act, 21 U.S.C. section 360bbb-3(b)(1), unless the authorization is terminated or revoked.  Performed at [facility], [address] [place], [place] [address]   Blood  culture (routine x 2)     Status: None (Preliminary result)   Collection Time: 05/29/23  6:34 AM   Specimen: BLOOD  Result Value Ref Range Status   Specimen Description   Final    BLOOD SITE NOT SPECIFIED Performed at [facility], [address], [place], [place] [address]    Special Requests   Final    BOTTLES DRAWN AEROBIC AND ANAEROBIC Blood Culture adequate volume Performed at [facility] [facility], [address], [place], [place] [address]    Culture   Final    NO GROWTH < 24  HOURS Performed at [facility], [address] [place], [place] [address]    Report Status PENDING  Incomplete  Blood culture (routine x 2)     Status: None (Preliminary result)   Collection Time: 05/29/23 11:06 AM   Specimen: BLOOD  Result Value Ref Range Status   Specimen Description   Final    BLOOD BLOOD RIGHT ARM Performed at [facility], [address], [place], [place] [address]    Special Requests   Final    BOTTLES DRAWN AEROBIC AND ANAEROBIC Blood Culture results may not be optimal due to an inadequate volume of blood received in culture bottles Performed at [facility], [address] [place], [place] [address]    Culture   Final    NO GROWTH < 24 HOURS Performed at [facility], [address] [place], [place] [address]    Report Status PENDING  Incomplete    FURTHER DISCHARGE INSTRUCTIONS:   Get Medicines reviewed and adjusted: Please take all your medications with you for your next visit with your Primary MD   Laboratory/radiological data: Please request your Primary MD to go over all hospital tests and procedure/radiological results at the follow up, please ask your Primary MD to get all Hospital records sent to his/her office.   In some cases, they will be blood work, cultures and biopsy results pending at the time of your discharge. Please request that your primary care M.D. goes through all the records of your hospital data and follows up on these results.   Also Note the following: If you experience worsening of your admission symptoms, develop shortness of breath, life threatening emergency, suicidal or homicidal thoughts you must seek medical attention immediately by calling 911 or calling your MD immediately  if symptoms less severe.   You must read complete instructions/literature along with all the possible adverse reactions/side effects for all the Medicines you take and that have been prescribed to  you. Take any new Medicines after you have completely understood and accpet all the possible adverse reactions/side effects.    Do not drive when taking Pain medications or sleeping medications (Benzodaizepines)   Do not take more than prescribed Pain, Sleep and Anxiety Medications. It is not advisable to combine anxiety,sleep and pain medications without talking with your primary care practitioner   Special Instructions: If you have smoked or chewed Tobacco  in the last 2 yrs please stop smoking, stop any regular Alcohol  and or any Recreational drug use.   Wear Seat belts while driving.   Please note: You were cared for by a hospitalist during your hospital stay. Once you are discharged, your primary care physician will handle any further medical issues. Please note that NO REFILLS for any discharge medications will be authorized once you are discharged, as it is imperative that you return to  your primary care physician (or establish a relationship with a primary care physician if you do not have one) for your post hospital discharge needs so that they can reassess your need for medications and monitor your lab values  Time coordinating discharge: Over 30 minutes  SIGNED:   Hughie Closs, MD  [facility] 05/30/2023, 2:22 PM *Please note that this is a verbal dictation therefore any spelling or grammatical errors are due to the "[employer] One" system interpretation. If 7PM-7AM, please contact night-coverage [email or web address]

## 2023-05-30 NOTE — Progress Notes (Signed)
Jordan Calderon   DOB:1961-01-06   ZO#:[id number]      ASSESSMENT & PLAN:  Diffuse large B cell lymphoma DLBCL -Patient was recently admitted and discharged from hospital a week ago 05/24/23 and reports he was okay.  During that admission, he was diagnosed with right nasopharyngeal mass with liver mets.  Patient is now readmitted on 05/29/23 due to complaints of shortness of breath and found to have pneumonia. -Plan is to start chemotherapy when his pneumonia resolves. -Primary Onc is Dr. Arlana Pouch; will need outpatient appt upon discharge.  2. Right nasopharyngeal mass with liver mets -Patient had CT of head done previous that showed a 3 mm mass with involvement of right adenoid and tonsils, right parapharyngeal space, right Mast masticator space and right parotid space. -Biopsy of right lobe liver was done 05/23/2023 and showed high-grade B cell lymphoma. -Biopsy of stomach ulcer done 05/22/2023 showed non-Hodgkin B cell lymphoma.  3.  Shortness of breath/pneumonia -CT scan of chest done in the ED showed evidence of bronchopneumonia -Continue IV antibiotics as ordered -Medicine following  4.  Headache -not relieved with tylenol. Patient receiving Ibuprofen during this assessment. -follow up with Medicine  5.  Normocytic anemia -Hgb stable 9.6 on 05/29/2023 -Transfuse PRBC for Hgb less than 7.0 -No indication to transfuse at this time, monitor closely  6.  HIV positive -Continue heart therapy Follow-up with ID   Code Status Full   Subjective:  Patient seen awake and alert, laying in supine in bed. Reports headache not relieved with tylenol and shortness of breath.  Otherwise no other complaints.  No acute distress noted.   Objective:  Vitals:   05/29/23 2213 05/30/23 0541  BP: 121/78 109/87  Pulse: 83 66  Resp: 18 18  Temp: 98.4 F (36.9 C) 98.3 F (36.8 C)  SpO2: 98% 97%     Intake/Output Summary (Last 24 hours) at 05/30/2023 1120 Last data filed at 05/29/2023 1501 Gross  per 24 hour  Intake 73.12 ml  Output --  Net 73.12 ml     REVIEW OF SYSTEMS:   Constitutional: +fatigue, Denies fevers, chills or abnormal night sweats Eyes: Denies blurriness of vision, double vision or watery eyes Ears, nose, mouth, throat, and face: Denies mucositis or sore throat Respiratory: Denies cough, +dyspnea  Cardiovascular: Denies palpitation, chest discomfort or lower extremity swelling Gastrointestinal:  Denies nausea, heartburn or change in bowel habits Skin: Denies abnormal skin rashes Lymphatics: Denies new lymphadenopathy or easy bruising Neurological: Denies numbness, tingling or new weaknesses Behavioral/Psych: Mood is stable, no new changes  All other systems were reviewed with the patient and are negative.  PHYSICAL EXAMINATION: ECOG PERFORMANCE STATUS: 2 - Symptomatic, <50% confined to bed  Vitals:   05/29/23 2213 05/30/23 0541  BP: 121/78 109/87  Pulse: 83 66  Resp: 18 18  Temp: 98.4 F (36.9 C) 98.3 F (36.8 C)  SpO2: 98% 97%   Filed Weights   05/29/23 0745  Weight: 160 lb (72.6 kg)    GENERAL: +weak appearing, alert, no distress and comfortable SKIN: +pale skin color, texture, turgor are normal, no rashes or significant lesions EYES: normal, conjunctiva are pink and non-injected, sclera clear OROPHARYNX: no exudate, no erythema and lips, buccal mucosa, and tongue normal  NECK: supple, thyroid normal size, non-tender, without nodularity LYMPH: no palpable lymphadenopathy in the cervical, axillary or inguinal LUNGS: clear to auscultation and percussion with normal breathing effort HEART: regular rate & rhythm and no murmurs and no lower extremity edema ABDOMEN: abdomen soft,  non-tender and normal bowel sounds MUSCULOSKELETAL: no cyanosis of digits and no clubbing  PSYCH: alert & oriented x 3 with fluent speech NEURO: no focal motor/sensory deficits   All questions were answered. The patient knows to call the clinic with any problems,  questions or concerns.   The total time spent in the appointment was 25 minutes encounter with patient including review of chart and various tests results, discussions about plan of care and coordination of care plan  Dawson Bills, NP 05/30/2023 11:20 AM    Labs Reviewed:  Lab Results  Component Value Date   WBC 5.8 05/29/2023   HGB 9.6 (L) 05/29/2023   HCT 31.0 (L) 05/29/2023   MCV 102.6 (H) 05/29/2023   PLT 203 05/29/2023   Recent Labs    05/11/23 1135 05/12/23 0627 05/18/23 0452 05/22/23 0709 05/23/23 0524 05/24/23 0537 05/29/23 0235  NA 132*   < > 132*   < > 133* 133* 133*  K 3.7   < > 4.1   < > 4.0 4.7 4.0  CL 102   < > 104   < > 101 101 105  CO2 20*   < > 22   < > 24 25 22   GLUCOSE 155*   < > 103*   < > 141* 102* 97  BUN 27*   < > 17   < > 25* 23 23  CREATININE 1.58*   < > 1.08   < > 1.32* 1.24 1.06  CALCIUM 9.1   < > 8.4*   < > 8.6* 8.7* 8.5*  GFRNONAA 49*   < > >60   < > >60 >60 >60  PROT 8.2*  --  7.0  --   --   --  7.6  ALBUMIN 4.1  --  3.1*  --   --   --  3.4*  AST 35  --  24  --   --   --  24  ALT 33  --  26  --   --   --  32  ALKPHOS 111  --  87  --   --   --  90  BILITOT 1.2*  --  0.5  --   --   --  0.3   < > = values in this interval not displayed.    Studies Reviewed:  CT Angio Chest PE W and/or Wo Contrast  Result Date: 05/29/2023 CLINICAL DATA:  Septic arterial embolism. EXAM: CT ANGIOGRAPHY CHEST WITH CONTRAST TECHNIQUE: Multidetector CT imaging of the chest was performed using the standard protocol during bolus administration of intravenous contrast. Multiplanar CT image reconstructions and MIPs were obtained to evaluate the vascular anatomy. RADIATION DOSE REDUCTION: This exam was performed according to the departmental dose-optimization program which includes automated exposure control, adjustment of the mA and/or kV according to patient size and/or use of iterative reconstruction technique. CONTRAST:  75mL OMNIPAQUE IOHEXOL 350 MG/ML SOLN  COMPARISON:  05/20/2023 chest CT. FINDINGS: Cardiovascular: Satisfactory opacification of the pulmonary arteries to the segmental level. No evidence of pulmonary embolism. Normal heart size. No pericardial effusion. 3.8 cm diameter ascending aorta. Aortic atherosclerosis. Mediastinum/Nodes: No worrisome lymph node. Lungs/Pleura: Generalized airway thickening since prior. There is airway thickening that is focally marked in the posterior segment of the left upper lobe, largely acute when compared to recent prior. Patchy areas of ground-glass density in the bilateral lungs, mild. Branching density over the right diaphragm and nodular density over the left diaphragm, a pre-existing nodule measuring  approximately 1 cm, three-month follow-up already recommended in the setting of active malignancy. Paraseptal emphysema at the apices. Upper Abdomen: Known hepatic metastatic disease.  No acute finding Musculoskeletal: No emergent finding. Review of the MIP images confirms the above findings. IMPRESSION: 1. Bronchitis with patchy areas of mild bronchopneumonia. 2. Known malignancy with pre-existing pulmonary nodule and recommended follow-up. 3. Atherosclerosis including the coronary arteries. Electronically Signed   By: Tiburcio Pea M.D.   On: 05/29/2023 04:40   DG Chest Portable 1 View  Result Date: 05/29/2023 CLINICAL DATA:  Shortness of breath EXAM: PORTABLE CHEST 1 VIEW COMPARISON:  05/17/2023 FINDINGS: Heart and mediastinal contours are within normal limits. No focal opacities or effusions. No acute bony abnormality. IMPRESSION: No active disease. Electronically Signed   By: Charlett Nose M.D.   On: 05/29/2023 03:39   DG Swallowing Func-Speech Pathology  Result Date: 05/24/2023 Table formatting from the original result was not included. Modified Barium Swallow Study Patient Details Name: JEFFRIE DICOLA MRN: [id number] Date of Birth: 1960-10-03 Today's Date: 05/24/2023 HPI/PMH: HPI: Dayvin Manchego is a 62 yo male  who presented to [facility] on 11/27 with lightheadedness, R ear pain, R ptosis, HA, cough, n/v. 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 CT NECK FINDINGS:  Pharynx and larynx: There is a large right nasopharyngeal mass that  extends inferiorly to the level the greater horn of the hyoid bone  on right. This mass involves the right-sided adenoid and palatine  tonsils, the right parapharyngeal space, the right masticator space,  the right parotid space. Superiorly this mass likely extends to the  level of the foramen ovale bilaterally.  CT CHEST showed liver lesions concerning for mets; patulous esophagus. Results of biopsies pending. EGD 12/2: gastric ulcer, severe esophagitis, candidiasis. ENT and oncology following. PMH includes HIV on Hart therapy, HLD, depression, anxiety, AKI. Clinical Impression: Pt demonstrates mild oropharyngeal dysphagia characterized by mild residue in the vallecula and pyriform sinuses. There is appearance of mildly decreased hyoid excursion but very good laryngeal elevation. Pt may have decreased base of tongue retraction and decreased pharyngeal contraction that lead to pharyngeal residuals. The pt clears with an effortful swallow. Residue most significant with thin liquids. There was one instance of trace penetration post swallow with wet vocal quality and throat clear ejecting. Pt recommended to continue foods and drinks of choice. Suggested effortful swallows as a way to clear residue, but also to use as part of an initial home exercise program for pre radiation treatment (potential). Introduced concepts of radiation fibrosis, dysphagia and the importance of continueing swallowing. Pt not yet ready to absorb this and will need further education. Pt to f/u with OP SLP for further education and HEP. Factors that may increase risk of adverse event in presence of aspiration Rubye Oaks & Clearance Coots 2021): No data recorded  Recommendations/Plan: Swallowing Evaluation Recommendations Swallowing Evaluation Recommendations Recommendations: PO diet PO Diet Recommendation: Regular; Thin liquids (Level 0) Liquid Administration via: Cup; Straw Medication Administration: Whole meds with liquid Supervision: Patient able to self-feed Swallowing strategies  : effortful swallow Postural changes: Stay upright 30-60 min after meals Oral care recommendations: Oral care BID (2x/day) Treatment Plan Treatment Plan Treatment recommendations: Defer treatment plan to SLP at other venue (see follow-up recommendations) Follow-up recommendations: Outpatient SLP Recommendations Recommendations for follow up therapy are one component of a multi-disciplinary discharge planning process, led by the attending physician.  Recommendations may be updated based on patient status, additional functional criteria and insurance authorization.  Assessment: Orofacial Exam: Orofacial Exam Oral Cavity - Dentition: Adequate natural dentition Anatomy: No data recorded Boluses Administered: Boluses Administered Boluses Administered: Thin liquids (Level 0); Mildly thick liquids (Level 2, nectar thick); Moderately thick liquids (Level 3, honey thick); Puree; Solid  Oral Impairment Domain: Oral Impairment Domain Lip Closure: No labial escape Tongue control during bolus hold: Cohesive bolus between tongue to palatal seal Bolus preparation/mastication: Timely and efficient chewing and mashing Bolus transport/lingual motion: Brisk tongue motion Oral residue: Complete oral clearance Location of oral residue : N/A Initiation of pharyngeal swallow : Posterior laryngeal surface of the epiglottis  Pharyngeal Impairment Domain: Pharyngeal Impairment Domain Soft palate elevation: No bolus between soft palate (SP)/pharyngeal wall (PW) Laryngeal elevation: Complete superior movement of thyroid cartilage with complete approximation of arytenoids to epiglottic petiole Anterior hyoid excursion:  Partial anterior movement Epiglottic movement: Complete inversion Laryngeal vestibule closure: Complete, no air/contrast in laryngeal vestibule Pharyngeal stripping wave : Present - diminished Pharyngeal contraction (A/P view only): N/A Pharyngoesophageal segment opening: Complete distension and complete duration, no obstruction of flow Tongue base retraction: Trace column of contrast or air between tongue base and PPW Pharyngeal residue: Trace residue within or on pharyngeal structures; Collection of residue within or on pharyngeal structures Location of pharyngeal residue: Valleculae; Pyriform sinuses  Esophageal Impairment Domain: Esophageal Impairment Domain Esophageal clearance upright position: Esophageal retention Pill: Pill Consistency administered: Thin liquids (Level 0) Penetration/Aspiration Scale Score: No data recorded Compensatory Strategies: Compensatory Strategies Compensatory strategies: Yes Effortful swallow: Effective Chin tuck: Ineffective   General Information: Caregiver present: No  Diet Prior to this Study: Regular; Thin liquids (Level 0)   Temperature : Normal   Respiratory Status: WFL   Supplemental O2: None (Room air)   No data recorded Behavior/Cognition: Alert; Cooperative Self-Feeding Abilities: Able to self-feed Baseline vocal quality/speech: Dysphonic Volitional Cough: Able to elicit Volitional Swallow: Able to elicit No data recorded Goal Planning: No data recorded No data recorded No data recorded No data recorded No data recorded Pain: No data recorded End of Session: Start Time:SLP Start Time (ACUTE ONLY): 1115 Stop Time: SLP Stop Time (ACUTE ONLY): 1136 Time Calculation:SLP Time Calculation (min) (ACUTE ONLY): 21 min Charges: SLP Evaluations $ SLP Speech Visit: 1 Visit SLP Evaluations $BSS Swallow: 1 Procedure $MBS Swallow: 1 Procedure $Swallowing Treatment: 1 Procedure SLP visit diagnosis: SLP Visit Diagnosis: Dysphagia, oropharyngeal phase (R13.12) Past Medical History: Past  Medical History: Diagnosis Date  Anxiety   Cellulitis 02/11/2014  LEFT ARM  Depression   Headache(784.0)   HIV (human immunodeficiency virus infection) (HCC)  Past Surgical History: Past Surgical History: Procedure Laterality Date  ARM HARDWARE REMOVAL Left   arm surgery    HEMORRHOID SURGERY    NASOPHARYNGEAL BIOPSY N/A 05/19/2023  Procedure: NASOPHARYNGEAL BIOPSY;  Surgeon: Newman Pies, MD;  Location: [facility] OR;  Service: ENT;  Laterality: N/A; [place], Riley Nearing 05/24/2023, 12:22 PM  CT ABDOMEN PELVIS W CONTRAST  Result Date: 05/23/2023 CLINICAL DATA:  Abnormal neck and chest CT, nasopharyngeal mass EXAM: CT ABDOMEN AND PELVIS WITH CONTRAST TECHNIQUE: Multidetector CT imaging of the abdomen and pelvis was performed using the standard protocol following bolus administration of intravenous contrast. RADIATION DOSE REDUCTION: This exam was performed according to the departmental dose-optimization program which includes automated exposure control, adjustment of the mA and/or kV according to patient size and/or use of iterative reconstruction technique. CONTRAST:  75mL OMNIPAQUE IOHEXOL 350 MG/ML SOLN COMPARISON:  CT 05/20/2023 FINDINGS: Lower chest: See separately dictated recently performed chest CT. Left lung base pulmonary  nodule measuring 6 mm on series 4, image 21. No acute airspace disease. Hepatobiliary: Multiple hypodense liver masses concerning for metastatic disease. Index right hepatic lobe mass lesion measures 2.8 x 2.3 cm on series 3, image 22. Index left hepatic lobe lesion measures 2.4 x 2.6 cm on series 3, image 31. Numerous additional hypodense liver masses. No calcified gallstone or biliary dilatation. Pancreas: Unremarkable. No pancreatic ductal dilatation or surrounding inflammatory changes. Spleen: Normal in size without focal abnormality. Adrenals/Urinary Tract: Adrenal glands are normal. Kidneys show no hydronephrosis. The bladder is unremarkable Stomach/Bowel: Stomach nonenlarged. Focal wall  thickening of the mid gastric lumen, series 3, image 36 and coronal series 6, image 77. On sagittal views, ulcer crater at the lesser curvature of stomach, series 7, image 87 and 88. No small bowel distension. No acute bowel wall thickening. Vascular/Lymphatic: Nonaneurysmal aorta with mild atherosclerosis. Mildly enlarged gastrohepatic lymph nodes measuring 14 x 14 mm on series 3, image 27. Negative for retroperitoneal or pelvic adenopathy. Reproductive: Prostate is unremarkable. Other: Negative for pelvic effusion or free air for small fat containing inguinal hernias. Musculoskeletal: No acute or suspicious osseous abnormality. IMPRESSION: 1. Multiple hypodense liver masses concerning for metastatic disease. Mildly enlarged gastrohepatic lymph nodes also suspicious for metastatic disease. 2. Focal wall thickening of the mid gastric lumen with ulcer crater at the lesser curvature of stomach. Correlate with endoscopy if not already performed. 3. Left lung base pulmonary nodule measuring 6 mm. See separately dictated recently performed chest CT. 4. Aortic atherosclerosis. Aortic Atherosclerosis (ICD10-I70.0). Electronically Signed   By: Jasmine Pang M.D.   On: 05/23/2023 17:52   US BIOPSY (LIVER)  Result Date: 05/23/2023 INDICATION: 62 year old with multiple liver lesions. Findings are suggestive for metastatic disease. Diagnosis is needed. EXAM: ULTRASOUND-GUIDED LIVER LESION BIOPSY MEDICATIONS: Moderate sedation ANESTHESIA/SEDATION: Moderate (conscious) sedation was employed during this procedure. A total of Versed 1 mg and Fentanyl 25 mcg was administered intravenously by the radiology nurse. Total intra-service moderate Sedation Time: 18 minutes. The patient's level of consciousness and vital signs were monitored continuously by radiology nursing throughout the procedure under my direct supervision. FLUOROSCOPY TIME:  None COMPLICATIONS: None immediate. PROCEDURE: Informed written consent was obtained from  the patient after a thorough discussion of the procedural risks, benefits and alternatives. All questions were addressed. Maximal Sterile Barrier Technique was utilized including caps, mask, sterile gowns, sterile gloves, sterile drape, hand hygiene and skin antiseptic. A timeout was performed prior to the initiation of the procedure. Abdomen was evaluated with ultrasound. A right hepatic lesion was targeted. The right side of the abdomen was prepped with chlorhexidine and sterile field was created. Skin was anesthetized using 1% lidocaine. Small incision was made. Using ultrasound guidance, 17 gauge coaxial needle was directed into a right hepatic lesion. Total of 3 core biopsies were performed with an 18 gauge core device. Three adequate specimens obtained and placed in formalin. 17 gauge coaxial needle was removed without complication. Bandage placed over the puncture site. FINDINGS: Several hyperechoic lesions scattered throughout the liver. A lesion in the right hepatic lobe was targeted. Biopsy needle confirmed within the lesion. Three adequate specimens obtained. No immediate bleeding or hematoma formation. IMPRESSION: Successful ultrasound-guided core biopsies from a right hepatic lesion. Electronically Signed   By: Richarda Overlie M.D.   On: 05/23/2023 10:22   CT CHEST W CONTRAST  Result Date: 05/20/2023 CLINICAL DATA:  Staging colon cancer. New nasopharyngeal mass. * Tracking Code: BO * EXAM: CT CHEST WITH CONTRAST TECHNIQUE: Multidetector CT imaging  of the chest was performed during intravenous contrast administration. RADIATION DOSE REDUCTION: This exam was performed according to the departmental dose-optimization program which includes automated exposure control, adjustment of the mA and/or kV according to patient size and/or use of iterative reconstruction technique. CONTRAST:  50mL OMNIPAQUE IOHEXOL 350 MG/ML SOLN COMPARISON:  None Available. FINDINGS: Cardiovascular: Heart is nonenlarged. Coronary  artery calcifications are seen. Please correlate for other coronary risk factors. Diameter of the ascending aorta measures 4.4 by 4.4 cm at the level of the right pulmonary artery. More normal caliber arch and descending thoracic aorta. Mild vascular calcifications along the aorta. Mediastinum/Nodes: Preserved thyroid gland. Slightly patulous thoracic esophagus with some luminal air and fluid. No specific abnormal lymph node enlargement present in the axillary region, hilum or mediastinum. A few small less than 1 cm size mediastinal and right hilar nodes are seen, nonpathologic by size criteria. Lungs/Pleura: No consolidation, pneumothorax or effusion. Slight breathing motion. Left lower lobe lung nodule identified measuring 8 by 3 mm on series 4, image 136. Similar focus in the right lung base measuring 7 mm on series 4 image 123. Few other tiny nodules in the right lung such as series 4, image 119 in the lower lobe. Few patchy areas of ground-glass type areas are seen as well. Apical pleural thickening identified with some paraseptal and centrilobular emphysematous changes. Some areas of interstitial septal thickening as well. Upper Abdomen: Multiple liver masses are identified. At least 15 lesions are seen. Example in segment 2 on series 3, image 147 measures 2.7 by 2.6 cm. Middle lobe lesion on series 3, image 174 measures 2.7 by 2.7 cm. Adrenal glands are preserved. There also abnormal nodes identified such as lesser curve of the stomach measuring 16 x 20 mm. Musculoskeletal: Mild degenerative changes along the spine. IMPRESSION: Numerous liver metastases. Upper abdominal abnormal nodes. Further workup of the abdomen and pelvis may be useful when clinically appropriate if there is no known previous. Patulous esophagus with slight wall thickening. Please correlate with symptoms and additional evaluation when clinically appropriate. Small mediastinal lymph nodes.  Attention on follow-up. Small lung nodules  measuring up to 8 mm. Recommend follow up in 3 months or as per the patient's primary neoplasm. Coronary artery calcifications. In addition dilatation of the ascending aorta. Recommend annual imaging followup by CTA or MRA. This recommendation follows 2010 ACCF/AHA/AATS/ACR/ASA/SCA/SCAI/SIR/STS/SVM Guidelines for the Diagnosis and Management of Patients with Thoracic Aortic Disease. Circulation. 2010; 121: Z610-R604. Aortic aneurysm NOS (ICD10-I71.9) Aortic Atherosclerosis (ICD10-I70.0) and Emphysema (ICD10-J43.9). Electronically Signed   By: Karen Kays M.D.   On: 05/20/2023 14:09   CT SOFT TISSUE NECK W CONTRAST  Result Date: 05/18/2023 CLINICAL DATA:  Neck mass EXAM: CT NECK WITH CONTRAST TECHNIQUE: Multidetector CT imaging of the neck was performed using the standard protocol following the bolus administration of intravenous contrast. RADIATION DOSE REDUCTION: This exam was performed according to the departmental dose-optimization program which includes automated exposure control, adjustment of the mA and/or kV according to patient size and/or use of iterative reconstruction technique. CONTRAST:  75mL OMNIPAQUE IOHEXOL 350 MG/ML SOLN COMPARISON:  CTA head/neck 05/17/23 FINDINGS: Pharynx and larynx: There is a large right nasopharyngeal mass that extends inferiorly to the level the greater horn of the hyoid bone on right. This mass involves the right-sided adenoid and palatine tonsils, the right parapharyngeal space, the right masticator space, the right parotid space. Superiorly this mass likely extends to the level of the foramen ovale bilaterally. Salivary glands: The lateral extension of the  large nasopharyngeal mass abuts the right parotid gland in the superior aspect of the right submandibular gland. There is a 9 mm hypodense lesion in the left parotid gland. Thyroid: Normal. Lymph nodes: There is a 9 mm hypodense lesion in the left parotid gland (series 2, image 46). It is unclear if this represents  a necrotic lymph node or an intraparotid cyst. Vascular: Negative. Limited intracranial: Negative. Visualized orbits: Negative. Mastoids and visualized paranasal sinuses: No middle ear or mastoid effusion. Paranasal sinuses are clear. Skeleton: No acute or aggressive process. Upper chest: Negative. Other: None IMPRESSION: 1. Large right nasopharyngeal mass that extends inferiorly to the level the greater horn of the hyoid bone on the right. This mass involves the right-sided adenoid and palatine tonsils, the right parapharyngeal space, the right masticator space, the right parotid space. Superiorly this mass likely extends to the level of the foramen ovale bilaterally. 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 Recommend ENT consultation. 2. A 9 mm hypodense lesion in the left parotid gland. It is unclear if this represents a necrotic lymph node or an intraparotid cyst. Electronically Signed   By: Lorenza Cambridge M.D.   On: 05/18/2023 11:31   MR ANGIO HEAD WO CONTRAST  Result Date: 05/17/2023 CLINICAL DATA:  Initial evaluation for persistent dizziness. EXAM: MRA HEAD WITHOUT CONTRAST TECHNIQUE: Angiographic images of the Circle of Willis were acquired using MRA technique without intravenous contrast. COMPARISON:  CTA from earlier the same day. FINDINGS: Anterior circulation: Both internal carotid arteries are widely patent through the siphons without stenosis or other abnormality. Again seen is a 3 mm outpouching extending posteriorly and laterally from the left ICA terminus (series 2, image 93). Possible small vessel emanating from its apex, not entirely certain. Finding could reflect an vascular infundibulum versus aneurysm. A1 segments patent bilaterally, with the right dominant. Normal anterior communicating artery complex. Anterior cerebral arteries patent without stenosis. No M1 stenosis or occlusion. Distal MCA branches perfused and symmetric. Posterior circulation: Visualized V4  segments patent without stenosis. Right PICA patent. Left PICA origin not seen. Basilar patent without stenosis. Superior cerebellar and posterior cerebral arteries widely patent bilaterally. Anatomic variants: As above. Other: Previously identified mass at the right skull base noted, partially visualized, better evaluated on prior CT. IMPRESSION: 1. 3 mm outpouching extending posteriorly and laterally from the left ICA terminus, potentially reflecting an vascular infundibulum versus aneurysm. Attention at follow-up recommended. 2. Otherwise normal intracranial MRA. No large vessel occlusion or other emergent finding. Electronically Signed   By: Rise Mu M.D.   On: 05/17/2023 20:58   CT ANGIO HEAD NECK W WO CM  Result Date: 05/17/2023 CLINICAL DATA:  Cerebral vasospasm suspected EXAM: CT ANGIOGRAPHY HEAD AND NECK WITH AND WITHOUT CONTRAST TECHNIQUE: Multidetector CT imaging of the head and neck was performed using the standard protocol during bolus administration of intravenous contrast. Multiplanar CT image reconstructions and MIPs were obtained to evaluate the vascular anatomy. Carotid stenosis measurements (when applicable) are obtained utilizing NASCET criteria, using the distal internal carotid diameter as the denominator. RADIATION DOSE REDUCTION: This exam was performed according to the departmental dose-optimization program which includes automated exposure control, adjustment of the mA and/or kV according to patient size and/or use of iterative reconstruction technique. CONTRAST:  75mL OMNIPAQUE IOHEXOL 350 MG/ML SOLN COMPARISON:  None Available. FINDINGS: CT HEAD FINDINGS Brain: No evidence of acute infarction, hemorrhage, hydrocephalus, extra-axial collection or intraparenchymal mass lesion/mass effect. Vascular: See below. Skull: No acute fracture. Sinuses/Orbits: Clear sinuses.  No  acute orbital findings. Other: No mastoid effusions. Review of the MIP images confirms the above findings  CTA NECK FINDINGS Aortic arch: Great vessel origins are patent without significant stenosis. Right carotid system: No evidence of dissection, stenosis (50% or greater), or occlusion. Left carotid system: No evidence of dissection, stenosis (50% or greater), or occlusion. Mild atherosclerosis at the carotid bifurcation. Vertebral arteries: Codominant. No evidence of dissection, stenosis (50% or greater), or occlusion. Skeleton: No acute abnormality on limited assessment. 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 Review of the MIP images confirms the above findings CTA HEAD FINDINGS Anterior circulation: Bilateral intracranial ICAs, MCAs, and ACAs are patent without proximal hemodynamically significant stenosis. Hypoplastic left A1 ACA, likely congenital. Approximately 2 mm posteriorly inferiorly directed outpouching arising from the left carotid terminus (for example see series 605, image 111 and series 604, image 154 Posterior circulation: Tortuous vertebral arteries. The vertebral arteries, basilar artery and bilateral posterior cerebral arteries are patent without proximal hemodynamically significant stenosis. Venous sinuses: As permitted by contrast timing, patent. Anatomic variants: Described above. Review of the MIP images confirms the above findings IMPRESSION: 1. 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 Recommend ENT consultation, correlation with direct inspection, and follow-up dedicated CT of the neck with contrast for better characterization. 2. No emergent large vessel occlusion or proximal hemodynamically significant stenosis. 3. Approximately 2 mm posteriorly inferiorly directed outpouching arising from the left carotid terminus which could represent an infundibulum with vessel not well seen versus aneurysm. 4.  Aortic Atherosclerosis (ICD10-I70.0) and Emphysema (ICD10-J43.9). Electronically Signed   By: Feliberto Harts M.D.   On: 05/17/2023 14:55   DG Chest Port 1 View  Result Date: 05/17/2023 CLINICAL DATA:  Worsening cough and dry heaving with shortness of breath EXAM: PORTABLE CHEST 1 VIEW COMPARISON:  Chest radiograph dated 05/11/2023 FINDINGS: Normal lung volumes. No focal consolidations. No pleural effusion or pneumothorax. The heart size and mediastinal contours are within normal limits. No acute osseous abnormality. IMPRESSION: No active disease. Electronically Signed   By: Agustin Cree M.D.   On: 05/17/2023 14:04   ECHOCARDIOGRAM COMPLETE  Result Date: 05/12/2023    ECHOCARDIOGRAM REPORT   Patient Name:   DAISON STEGEN Lyons Date of Exam: 05/12/2023 Medical Rec #:  [id number]       Height:       69.0 in Accession #:    [id number]      Weight:       154.6 lb Date of Birth:  Oct 03, 1960       BSA:          1.852 m Patient Age:    62 years        BP:           124/67 mmHg Patient Gender: M               HR:           73 bpm. Exam Location:  Inpatient Procedure: 2D Echo, Color Doppler and Cardiac Doppler Indications:    Syncope  History:        Patient has no prior history of Echocardiogram examinations.                 Signs/Symptoms:Syncope; Risk Factors:HIV, Polysubstance Abuse                 and Former Smoker.  Sonographer:  Milbert Coulter Referring Phys: [id number] JARED M GARDNER IMPRESSIONS  1. Left ventricular ejection fraction, by estimation, is 60 to 65%. The left ventricle has normal function. The left ventricle has no regional wall motion abnormalities. Left ventricular diastolic parameters were normal.  2. Right ventricular systolic function is normal. The right ventricular size is normal. There is normal pulmonary artery systolic pressure.  3. The mitral valve is normal in structure. No evidence of mitral valve regurgitation. No evidence of mitral stenosis.  4. The aortic valve is tricuspid. There is mild  calcification of the aortic valve. There is mild thickening of the aortic valve. Aortic valve regurgitation is not visualized. Aortic valve sclerosis/calcification is present, without any evidence of aortic stenosis.  5. Aortic dilatation noted. There is mild dilatation of the ascending aorta, measuring 40 mm.  6. The inferior vena cava is normal in size with greater than 50% respiratory variability, suggesting right atrial pressure of 3 mmHg. Comparison(s): No prior Echocardiogram. Conclusion(s)/Recommendation(s): Otherwise normal echocardiogram, with minor abnormalities described in the report. Mild dilation of ascending aorta. FINDINGS  Left Ventricle: Left ventricular ejection fraction, by estimation, is 60 to 65%. The left ventricle has normal function. The left ventricle has no regional wall motion abnormalities. The left ventricular internal cavity size was normal in size. There is  no left ventricular hypertrophy. Left ventricular diastolic parameters were normal. Right Ventricle: The right ventricular size is normal. No increase in right ventricular wall thickness. Right ventricular systolic function is normal. There is normal pulmonary artery systolic pressure. The tricuspid regurgitant velocity is 2.54 m/s, and  with an assumed right atrial pressure of 3 mmHg, the estimated right ventricular systolic pressure is 28.8 mmHg. Left Atrium: Left atrial size was normal in size. Right Atrium: Right atrial size was normal in size. Prominent Chiari network. Pericardium: There is no evidence of pericardial effusion. Mitral Valve: The mitral valve is normal in structure. No evidence of mitral valve regurgitation. No evidence of mitral valve stenosis. Tricuspid Valve: The tricuspid valve is normal in structure. Tricuspid valve regurgitation is mild . No evidence of tricuspid stenosis. Aortic Valve: The aortic valve is tricuspid. There is mild calcification of the aortic valve. There is mild thickening of the aortic  valve. Aortic valve regurgitation is not visualized. Aortic valve sclerosis/calcification is present, without any evidence of aortic stenosis. Aortic valve mean gradient measures 4.0 mmHg. Aortic valve peak gradient measures 8.3 mmHg. Aortic valve area, by VTI measures 4.09 cm. Pulmonic Valve: The pulmonic valve was grossly normal. Pulmonic valve regurgitation is trivial. No evidence of pulmonic stenosis. Aorta: Aortic dilatation noted. There is mild dilatation of the ascending aorta, measuring 40 mm. Venous: The inferior vena cava is normal in size with greater than 50% respiratory variability, suggesting right atrial pressure of 3 mmHg. IAS/Shunts: No atrial level shunt detected by color flow Doppler.  LEFT VENTRICLE PLAX 2D LVIDd:         4.60 cm   Diastology LVIDs:         3.10 cm   LV e' medial:    7.18 cm/s LV PW:         1.10 cm   LV E/e' medial:  6.4 LV IVS:        1.20 cm   LV e' lateral:   16.00 cm/s LVOT diam:     2.40 cm   LV E/e' lateral: 2.8 LV SV:         94 LV SV Index:   51 LVOT Area:  4.52 cm  RIGHT VENTRICLE RV Basal diam:  3.00 cm RV Mid diam:    2.80 cm RV S prime:     13.60 cm/s TAPSE (M-mode): 2.0 cm LEFT ATRIUM             Index        RIGHT ATRIUM           Index LA diam:        3.80 cm 2.05 cm/m   RA Area:     13.10 cm LA Vol (A2C):   34.3 ml 18.52 ml/m  RA Volume:   29.50 ml  15.93 ml/m LA Vol (A4C):   45.1 ml 24.35 ml/m LA Biplane Vol: 40.7 ml 21.98 ml/m  AORTIC VALVE AV Area (Vmax):    3.96 cm AV Area (Vmean):   3.80 cm AV Area (VTI):     4.09 cm AV Vmax:           144.00 cm/s AV Vmean:          94.700 cm/s AV VTI:            0.230 m AV Peak Grad:      8.3 mmHg AV Mean Grad:      4.0 mmHg LVOT Vmax:         126.00 cm/s LVOT Vmean:        79.600 cm/s LVOT VTI:          0.208 m LVOT/AV VTI ratio: 0.90  AORTA Ao Root diam: 4.00 cm Ao Asc diam:  4.00 cm MITRAL VALVE               TRICUSPID VALVE MV Area (PHT): 2.33 cm    TR Peak grad:   25.8 mmHg MV Decel Time: 325 msec    TR  Vmax:        254.00 cm/s MV E velocity: 45.60 cm/s MV A velocity: 81.20 cm/s  SHUNTS MV E/A ratio:  0.56        Systemic VTI:  0.21 m                            Systemic Diam: 2.40 cm Jodelle Red MD Electronically signed by Jodelle Red MD Signature Date/Time: 05/12/2023/5:59:52 PM    Final    DG Chest Portable 1 View  Result Date: 05/11/2023 CLINICAL DATA:  Cough. EXAM: PORTABLE CHEST 1 VIEW COMPARISON:  07/01/2018. FINDINGS: Bilateral lung fields are clear. No consolidation or lung collapse. No pulmonary edema. Bilateral costophrenic angles are clear. Normal cardio-mediastinal silhouette. No acute osseous abnormalities. The soft tissues are within normal limits. IMPRESSION: *No active disease. Electronically Signed   By: Jules Schick M.D.   On: 05/11/2023 13:47   CT Head Wo Contrast  Result Date: 05/11/2023 CLINICAL DATA:  Headache, new onset (Age >= 51y) EXAM: CT HEAD WITHOUT CONTRAST TECHNIQUE: Contiguous axial images were obtained from the base of the skull through the vertex without intravenous contrast. RADIATION DOSE REDUCTION: This exam was performed according to the departmental dose-optimization program which includes automated exposure control, adjustment of the mA and/or kV according to patient size and/or use of iterative reconstruction technique. COMPARISON:  None Available. FINDINGS: Brain: No evidence of acute infarction, hemorrhage, hydrocephalus, extra-axial collection or mass lesion/mass effect. Vascular: No hyperdense vessel or unexpected calcification. Skull: Normal. Negative for fracture or focal lesion. Sinuses/Orbits: No middle ear or mastoid effusion. Paranasal sinuses are notable for mucosal thickening of the floor of  the right maxillary sinus. Orbits are unremarkable. Other: None. IMPRESSION: No acute intracranial abnormality. Electronically Signed   By: Lorenza Cambridge M.D.   On: 05/11/2023 12:33

## 2023-05-30 NOTE — Care Management Obs Status (Signed)
MEDICARE OBSERVATION STATUS NOTIFICATION   Patient Details  Name: Jordan Calderon MRN: [id number] Date of Birth: [birth year]   Medicare Observation Status Notification Given:  Yes    Otelia Santee, LCSW 05/30/2023, 9:49 AM

## 2023-05-30 NOTE — Plan of Care (Signed)

## 2023-05-30 NOTE — Telephone Encounter (Signed)
Spoke with patient sister regarding appt wtith Gen surgery. Understands per Md okay to hold off.  Verbalized understanding. Juanita Laster, RMA

## 2023-05-30 NOTE — Plan of Care (Signed)
  Problem: Education: Goal: Knowledge of General Education information will improve Description: Including pain rating scale, medication(s)/side effects and non-pharmacologic comfort measures Outcome: Adequate for Discharge   Problem: Health Behavior/Discharge Planning: Goal: Ability to manage health-related needs will improve Outcome: Adequate for Discharge   Problem: Clinical Measurements: Goal: Ability to maintain clinical measurements within normal limits will improve Outcome: Adequate for Discharge Goal: Will remain free from infection Outcome: Adequate for Discharge Goal: Diagnostic test results will improve Outcome: Adequate for Discharge Goal: Respiratory complications will improve Outcome: Adequate for Discharge Goal: Cardiovascular complication will be avoided Outcome: Adequate for Discharge   Problem: Activity: Goal: Risk for activity intolerance will decrease Outcome: Adequate for Discharge   Problem: Pain Management: Goal: General experience of comfort will improve Outcome: Adequate for Discharge   Problem: Coping: Goal: Level of anxiety will decrease Outcome: Adequate for Discharge

## 2023-05-31 ENCOUNTER — Encounter: Payer: Self-pay | Admitting: Oncology

## 2023-05-31 ENCOUNTER — Other Ambulatory Visit: Payer: Self-pay | Admitting: Oncology

## 2023-05-31 ENCOUNTER — Telehealth: Payer: Self-pay | Admitting: Oncology

## 2023-05-31 DIAGNOSIS — C8330 Diffuse large B-cell lymphoma, unspecified site: Secondary | ICD-10-CM | POA: Insufficient documentation

## 2023-05-31 DIAGNOSIS — C8338 Diffuse large B-cell lymphoma, lymph nodes of multiple sites: Secondary | ICD-10-CM

## 2023-05-31 DIAGNOSIS — C8510 Unspecified B-cell lymphoma, unspecified site: Secondary | ICD-10-CM | POA: Insufficient documentation

## 2023-05-31 NOTE — Progress Notes (Signed)
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

## 2023-05-31 NOTE — Telephone Encounter (Signed)
Scheduled appointments per referral. Patient is aware of the appointment time and date as well as the address. Patient was informed to arrive 10-15 minutes prior with updated insurance information. All questions were answered.

## 2023-06-01 ENCOUNTER — Other Ambulatory Visit: Payer: Self-pay

## 2023-06-01 ENCOUNTER — Encounter: Payer: Self-pay | Admitting: Oncology

## 2023-06-02 ENCOUNTER — Inpatient Hospital Stay: Payer: Medicare Other | Attending: Oncology | Admitting: Oncology

## 2023-06-02 ENCOUNTER — Inpatient Hospital Stay: Payer: Medicare Other

## 2023-06-02 ENCOUNTER — Encounter: Payer: Self-pay | Admitting: Oncology

## 2023-06-02 ENCOUNTER — Other Ambulatory Visit: Payer: Self-pay

## 2023-06-02 ENCOUNTER — Encounter: Payer: Self-pay | Admitting: Physician Assistant

## 2023-06-02 VITALS — BP 122/82 | HR 85 | Temp 98.5°F | Resp 15 | Ht 69.0 in | Wt 162.5 lb

## 2023-06-02 DIAGNOSIS — D649 Anemia, unspecified: Secondary | ICD-10-CM

## 2023-06-02 DIAGNOSIS — R251 Tremor, unspecified: Secondary | ICD-10-CM | POA: Diagnosis not present

## 2023-06-02 DIAGNOSIS — B20 Human immunodeficiency virus [HIV] disease: Secondary | ICD-10-CM | POA: Insufficient documentation

## 2023-06-02 DIAGNOSIS — G893 Neoplasm related pain (acute) (chronic): Secondary | ICD-10-CM | POA: Diagnosis not present

## 2023-06-02 DIAGNOSIS — B1910 Unspecified viral hepatitis B without hepatic coma: Secondary | ICD-10-CM | POA: Insufficient documentation

## 2023-06-02 DIAGNOSIS — T451X5A Adverse effect of antineoplastic and immunosuppressive drugs, initial encounter: Secondary | ICD-10-CM | POA: Diagnosis not present

## 2023-06-02 DIAGNOSIS — C8338 Diffuse large B-cell lymphoma, lymph nodes of multiple sites: Secondary | ICD-10-CM | POA: Diagnosis not present

## 2023-06-02 DIAGNOSIS — Z5112 Encounter for antineoplastic immunotherapy: Secondary | ICD-10-CM | POA: Diagnosis not present

## 2023-06-02 DIAGNOSIS — K2270 Barrett's esophagus without dysplasia: Secondary | ICD-10-CM | POA: Insufficient documentation

## 2023-06-02 DIAGNOSIS — D701 Agranulocytosis secondary to cancer chemotherapy: Secondary | ICD-10-CM | POA: Insufficient documentation

## 2023-06-02 DIAGNOSIS — C8510 Unspecified B-cell lymphoma, unspecified site: Secondary | ICD-10-CM

## 2023-06-02 DIAGNOSIS — Z5111 Encounter for antineoplastic chemotherapy: Secondary | ICD-10-CM | POA: Insufficient documentation

## 2023-06-02 LAB — COMPREHENSIVE METABOLIC PANEL
ALT: 44 U/L (ref 0–44)
AST: 44 U/L — ABNORMAL HIGH (ref 15–41)
Albumin: 4.6 g/dL (ref 3.5–5.0)
Alkaline Phosphatase: 117 U/L (ref 38–126)
Anion gap: 7 (ref 5–15)
BUN: 31 mg/dL — ABNORMAL HIGH (ref 8–23)
CO2: 25 mmol/L (ref 22–32)
Calcium: 10.3 mg/dL (ref 8.9–10.3)
Chloride: 103 mmol/L (ref 98–111)
Creatinine, Ser: 1.25 mg/dL — ABNORMAL HIGH (ref 0.61–1.24)
GFR, Estimated: >60 mL/min (ref >60)
Glucose, Bld: 86 mg/dL (ref 70–99)
Potassium: 5.2 mmol/L — ABNORMAL HIGH (ref 3.5–5.1)
Sodium: 135 mmol/L (ref 135–145)
Total Bilirubin: 0.6 mg/dL (ref <1.2)
Total Protein: 8.9 g/dL — ABNORMAL HIGH (ref 6.5–8.1)

## 2023-06-02 LAB — FERRITIN: Ferritin: 118 ng/mL (ref 24–336)

## 2023-06-02 LAB — CBC WITH DIFFERENTIAL/PLATELET
Abs Immature Granulocytes: 0.05 10*3/uL (ref 0.00–0.07)
Basophils Absolute: 0.1 10*3/uL (ref 0.0–0.1)
Basophils Relative: 1 %
Eosinophils Absolute: 0.2 10*3/uL (ref 0.0–0.5)
Eosinophils Relative: 2 %
HCT: 35.9 % — ABNORMAL LOW (ref 39.0–52.0)
Hemoglobin: 11.9 g/dL — ABNORMAL LOW (ref 13.0–17.0)
Immature Granulocytes: 1 %
Lymphocytes Relative: 25 %
Lymphs Abs: 2.6 10*3/uL (ref 0.7–4.0)
MCH: 32.5 pg (ref 26.0–34.0)
MCHC: 33.1 g/dL (ref 30.0–36.0)
MCV: 98.1 fL (ref 80.0–100.0)
Monocytes Absolute: 1 10*3/uL (ref 0.1–1.0)
Monocytes Relative: 9 %
Neutro Abs: 6.7 10*3/uL (ref 1.7–7.7)
Neutrophils Relative %: 62 %
Platelets: 278 10*3/uL (ref 150–400)
RBC: 3.66 MIL/uL — ABNORMAL LOW (ref 4.22–5.81)
RDW: 14.2 % (ref 11.5–15.5)
WBC: 10.7 10*3/uL — ABNORMAL HIGH (ref 4.0–10.5)
nRBC: 0 % (ref 0.0–0.2)

## 2023-06-02 LAB — IRON AND IRON BINDING CAPACITY (CC-WL,HP ONLY)
Iron: 74 ug/dL (ref 45–182)
Saturation Ratios: 17 % — ABNORMAL LOW (ref 17.9–39.5)
TIBC: 433 ug/dL (ref 250–450)
UIBC: 359 ug/dL (ref 117–376)

## 2023-06-02 LAB — HEPATITIS B SURFACE ANTIGEN: Hepatitis B Surface Ag: NONREACTIVE

## 2023-06-02 LAB — LACTATE DEHYDROGENASE: LDH: 246 U/L — ABNORMAL HIGH (ref 98–192)

## 2023-06-02 LAB — VITAMIN B12: Vitamin B-12: 519 pg/mL (ref 180–914)

## 2023-06-02 LAB — FOLATE: Folate: 10.7 ng/mL (ref >5.9)

## 2023-06-02 MED ORDER — ONDANSETRON HCL 8 MG PO TABS: 8.0000 mg | ORAL_TABLET | Freq: Three times a day (TID) | ORAL | 1 refills | Status: DC | PRN

## 2023-06-02 MED ORDER — PREDNISONE 20 MG PO TABS: 100.0000 mg | ORAL_TABLET | Freq: Every day | ORAL | 5 refills | Status: DC

## 2023-06-02 MED ORDER — TRAMADOL HCL 50 MG PO TABS: 50.0000 mg | ORAL_TABLET | Freq: Three times a day (TID) | ORAL | 0 refills | Status: DC | PRN

## 2023-06-02 MED ORDER — ACYCLOVIR 400 MG PO TABS: 400.0000 mg | ORAL_TABLET | Freq: Every day | ORAL | 3 refills | Status: DC

## 2023-06-02 MED ORDER — ALLOPURINOL 300 MG PO TABS: 300.0000 mg | ORAL_TABLET | Freq: Every day | ORAL | 3 refills | Status: DC

## 2023-06-02 MED ORDER — LIDOCAINE-PRILOCAINE 2.5-2.5 % EX CREA: TOPICAL_CREAM | CUTANEOUS | 3 refills | Status: DC

## 2023-06-02 MED ORDER — PROCHLORPERAZINE MALEATE 10 MG PO TABS: 10.0000 mg | ORAL_TABLET | Freq: Four times a day (QID) | ORAL | 6 refills | Status: DC | PRN

## 2023-06-02 NOTE — Progress Notes (Unsigned)
[facility]  ONCOLOGY CLINIC PROGRESS NOTE   Patient Care Team: Pcp, No as PCP - General Roderic Scarce, LCSW as Counselor Comer, Belia Heman, MD as Consulting Physician (Infectious Diseases)  PATIENT NAME: Jordan Calderon   MR#: [id number] DOB: 10/07/1960  Date of visit: 06/02/2023   ASSESSMENT & PLAN:   Jordan Calderon is a 62 y.o. gentleman with past medical history of HIV disease, depression, hypothyroidism, recent syncopal episode in November 2024 for which he was hospitalized from 05/11/2023 until 05/12/2023 with grossly negative TTE and CT head workup, was admitted to the hospital again on 05/17/2023 after he presented with dizziness, headaches.  CT angiogram of the head and neck on 05/17/2023 showed large right nasopharyngeal skull base mass which was suboptimally evaluated but was worrisome for nasopharyngeal carcinoma.  He was admitted for further evaluation and management.  Eventual workup revealed large B-cell lymphoma, high-grade, with multiple liver lesions and biopsies from stomach positive for non-Hodgkin lymphoma.  Stage IV disease.  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 Plan to monitor response of mass to lymphoma treatment.  -Discussed diagnosis, prognosis, staging, plan of care, treatment options.  Reviewed NCCN guidelines and discussed management options.  -Ideally he would benefit from dose adjusted R-EPOCH regimen.  However he does have poor social support and fatigue which is chronic from his multiple comorbidities.  Hence a decision was made to start him on R-CHOP chemotherapy initially and depending on tolerability, we will consider escalation to DA-R-EPOCH regimen.  Presence of tremor and severe headache suggest possible CNS involvement.  -Perform lumbar puncture to check for lymphoma in the spinal fluid.  Request submitted to IR for the same.  If  present, initiate separate CNS-directed chemotherapy.  -He is currently scheduled for Port-A-Cath placement next week.  Will obtain PET/CT for staging.  -Will arrange for chemo education followed by chemo initiation within the next week.  I will see him approximately 7 to 10 days later for toxicity evaluation.  Human immunodeficiency virus (HIV) disease (HCC) - Currently under reasonable control.  Has close follow-up with ID.  His HIV medications may need to be adjusted once he is initiated on chemotherapy.  Will work with pharmacy regarding this.  Cancer associated pain Severe, persistent headache and facial pain, possibly related to nasopharyngeal mass. -Start Tramadol for pain management, up to three times a day as needed.  Normocytic anemia Likely from newly diagnosed large B-cell lymphoma.  Could also be from his medications.  -Iron studies grossly unremarkable today.  B12, folate are within normal limits.  Will monitor this during treatments.  Hemoglobin is overall stable at 11.9 today.   I reviewed lab results and outside records for this visit and discussed relevant results with the patient. Diagnosis, plan of care and treatment options were also discussed in detail with the patient. Opportunity provided to ask questions and answers provided to his apparent satisfaction. Provided instructions to call our clinic with any problems, questions or concerns prior to return visit. I recommended to continue follow-up with PCP and sub-specialists. He verbalized understanding and agreed with the plan.   NCCN guidelines have been consulted in the planning of this patient's care.  I spent a total of 55 minutes during this encounter with the patient including review of chart and various tests results, discussions about plan of care and coordination of care plan.   Jordan Crutch, MD  06/05/2023  4:25 PM  [facility] - A DEPT [facility] [address] [place] [address] Dept: [phone] Dept Fax: [phone]    CHIEF COMPLAINT/ REASON FOR VISIT:   Newly diagnosed high-grade B-cell lymphoma, large cell type, stage IV disease with multiple liver lesions.  Current Treatment: R-CHOP versus DA-R-EPOCH  INTERVAL HISTORY:    Discussed the use of AI scribe software for clinical note transcription with the patient, who gave verbal consent to proceed.   He was accompanied by his sister today, who is very supportive.  His sister lives in [place], [place].  He was also companied by a friend who lives locally.  He was recently diagnosed with large B cell non-Hodgkin's lymphoma involving the liver and stomach, presents with severe headaches and facial pain. The pain is localized to the right side of the face and neck, and is described as constant, with episodes of increased intensity. The patient also reports dizziness and lightheadedness. The headaches and facial pain have been ongoing since November and have not responded to Tylenol.  The patient also reports night sweats, which have been severe enough to require changing clothes during the night. The patient's appetite has decreased, but he is still able to eat and has been maintaining his weight. The patient also reports a tremor that has recently developed.  The patient's sister reports that the patient's right eye does not open fully and the blue of the eye has become lighter. The patient also has a history of an abnormal anal Pap smear and has been referred for a high-resolution anoscopy.  I have reviewed the past medical history, past surgical history, social history and family history with the patient and they are unchanged from previous note.  HISTORY OF PRESENT ILLNESS:   62 y.o. gentleman with past medical history of HIV disease, depression, hypothyroidism, recent syncopal episode in November 2024 for which he was hospitalized from 05/11/2023 until  05/12/2023 with grossly negative TTE and CT head workup, was admitted to the hospital again on 05/17/2023 after he presented with dizziness, headaches.  CT angiogram of the head and neck on 05/17/2023 showed large right nasopharyngeal skull base mass which was suboptimally evaluated but was worrisome for nasopharyngeal carcinoma.  He was admitted for further evaluation and management.  Eventual workup revealed large B-cell lymphoma, high-grade, with multiple liver lesions and biopsies from stomach positive for non-Hodgkin lymphoma.  Stage IV disease.   ENT evaluated him and performed biopsy of right nasopharyngeal mass on 05/19/2023.  We were consulted for any additional recommendations.   MR angiogram of the head without IV contrast on 05/11/2023 showed 3 mm outpouching extending posteriorly and laterally from the left ICA terminus, potentially reflecting a vascular infundibulum versus aneurysm.  Otherwise normal intracranial MRA.   CT soft tissue of the neck on 05/18/2023 showed large right nasopharyngeal mass that extended inferiorly to the level of the greater horn of the hyoid bone on the right.  This mass involves right-sided adenoid and palatine tonsils, right parapharyngeal space, right masticator space, right parotid space.  Superiorly this most extended to the level of foreman ovale bilaterally.  A 9 mm hypodense lesion in the left parotid gland was noted, unclear if necrotic lymph node versus intraparotid cyst.  On 05/19/2023, ENT performed excision of nasopharyngeal mass.  Pathology showed respiratory mucosa with chronic sinusitis and prominent secondary follicle/germinal center formation consistent with the presence of tonsillar type tissue.  Negative  for malignancy.  On 05/20/2023, CT scan of the chest showed multiple liver lesions, concerning for metastatic disease, at least 15 lesions.  It also showed upper abdominal abnormal lymph nodes.  On 05/23/2023, CT abdomen and pelvis showed  similar findings in the liver and gastrohepatic lymph nodes.  Focal wall thickening of the mid gastric lumen with also greater at the lesser curvature of stomach.  Left lung base lung nodule measuring 6 mm.  On 05/22/2023, GI performed upper endoscopy for further evaluation of anemia.  Esophageal mucosal changes classified as Barrett's stage C2-M3.  LA grade D esophagitis.  Esophageal plaques were found, consistent with candidiasis.  Nonbleeding gastric ulcer which was biopsied.  Pathology from stomach ulcer biopsy came back positive for non-Hodgkin's lymphoma, large cell type.  Esophageal biopsy showed Barrett's esophagus.  On 05/23/2023, he underwent core needle biopsy of liver lesions.  Pathology showed high-grade B-cell lymphoma, large cell type.  Ki-67 was more than 50%.  Staging PET/CT pending.  Plan for chemotherapy initially with R-CHOP.  If well-tolerated, plan to escalate treatment with DA-R-EPOCH.  Oncology History  High grade B-cell lymphoma (HCC)  05/31/2023 Initial Diagnosis   DLBCL (diffuse large B cell lymphoma) (HCC)   06/09/2023 -  Chemotherapy   Patient is on Treatment Plan : NON-HODGKINS LYMPHOMA R-CHOP q21d         REVIEW OF SYSTEMS:   Review of Systems - Oncology  All other pertinent systems were reviewed with the patient and are negative.  ALLERGIES: He is allergic to sulfonamide derivatives.  MEDICATIONS:  Current Outpatient Medications  Medication Sig Dispense Refill   calcium carbonate (TUMS - DOSED IN MG ELEMENTAL CALCIUM) 500 MG chewable tablet Chew 1 tablet by mouth 3 (three) times daily with meals.     dapsone 100 MG tablet Take 1 tablet (100 mg total) by mouth daily. (Patient taking differently: Take 100 mg by mouth daily with breakfast.) 30 tablet 5   Darunavir-Cobicistat-Emtricitabine-Tenofovir Alafenamide (SYMTUZA) 800-150-200-10 MG TABS Take 1 tablet by mouth daily with breakfast. 30 tablet 11   fluconazole (DIFLUCAN) 200 MG tablet Take 1 tablet  (200 mg total) by mouth daily. 30 tablet 0   ibuprofen (ADVIL) 200 MG tablet Take 200-400 mg by mouth every 6 (six) hours as needed for mild pain (pain score 1-3).     levofloxacin (LEVAQUIN) 750 MG tablet Take 1 tablet (750 mg total) by mouth daily for 7 days. 7 tablet 0   pantoprazole (PROTONIX) 40 MG tablet Take 1 tablet (40 mg total) by mouth 2 (two) times daily before a meal. 60 tablet 0   traMADol (ULTRAM) 50 MG tablet Take 1 tablet (50 mg total) by mouth every 8 (eight) hours as needed. 60 tablet 0   acyclovir (ZOVIRAX) 400 MG tablet Take 1 tablet (400 mg total) by mouth daily. 30 tablet 3   allopurinol (ZYLOPRIM) 300 MG tablet Take 1 tablet (300 mg total) by mouth daily. 30 tablet 3   lidocaine-prilocaine (EMLA) cream Apply to affected area once 30 g 3   ondansetron (ZOFRAN) 8 MG tablet Take 1 tablet (8 mg total) by mouth every 8 (eight) hours as needed for nausea or vomiting. Start on the third day after cyclophosphamide chemotherapy. 30 tablet 1   predniSONE (DELTASONE) 20 MG tablet Take 5 tablets (100 mg total) by mouth daily. Take with food on days 1-5 of chemotherapy. 25 tablet 5   prochlorperazine (COMPAZINE) 10 MG tablet Take 1 tablet (10 mg total) by mouth every 6 (six) hours  as needed for nausea or vomiting. 30 tablet 6   rosuvastatin (CRESTOR) 10 MG tablet Take 1 tablet (10 mg total) by mouth daily. (Patient not taking: Reported on 05/17/2023) 30 tablet 11   No current facility-administered medications for this visit.     VITALS:   Blood pressure 122/82, pulse 85, temperature 98.5 F (36.9 C), resp. rate 15, height 5\' 9"  (1.753 m), weight 162 lb 8 oz (73.7 kg), SpO2 98%.  Wt Readings from Last 3 Encounters:  06/02/23 162 lb 8 oz (73.7 kg)  05/29/23 160 lb (72.6 kg)  05/19/23 160 lb (72.6 kg)    Body mass index is 24 kg/m.  Performance status (ECOG): 1 - Symptomatic but completely ambulatory  PHYSICAL EXAM:   Physical Exam Constitutional:      General: He is not in  acute distress.    Appearance: Normal appearance.  HENT:     Head: Normocephalic and atraumatic.  Eyes:     General: No scleral icterus.    Conjunctiva/sclera: Conjunctivae normal.  Cardiovascular:     Rate and Rhythm: Normal rate and regular rhythm.     Heart sounds: Normal heart sounds.  Pulmonary:     Effort: Pulmonary effort is normal.     Breath sounds: Normal breath sounds.  Abdominal:     General: There is no distension.  Musculoskeletal:     Right lower leg: No edema.     Left lower leg: No edema.  Lymphadenopathy:     Cervical: Cervical adenopathy present.  Neurological:     General: No focal deficit present.     Mental Status: He is alert and oriented to person, place, and time.  Psychiatric:     Comments: Intermittently tearful during the interview      LABORATORY DATA:   I have reviewed the data as listed.  Results for orders placed or performed in visit on 06/02/23  Folate  Result Value Ref Range   Folate 10.7 >5.9 ng/mL  Vitamin B12  Result Value Ref Range   Vitamin B-12 519 180 - 914 pg/mL  Ferritin  Result Value Ref Range   Ferritin 118 24 - 336 ng/mL  Iron and Iron Binding Capacity ([facility] only)  Result Value Ref Range   Iron 74 45 - 182 ug/dL   TIBC 161 096 - 045 ug/dL   Saturation Ratios 17 (L) 17.9 - 39.5 %   UIBC 359 117 - 376 ug/dL  Hepatitis B core antibody, total  Result Value Ref Range   Hep B Core Total Ab Reactive (A) NON REACTIVE  Hepatitis B surface antigen  Result Value Ref Range   Hepatitis B Surface Ag NON REACTIVE NON REACTIVE  Lactate dehydrogenase  Result Value Ref Range   LDH 246 (H) 98 - 192 U/L  Comprehensive metabolic panel  Result Value Ref Range   Sodium 135 135 - 145 mmol/L   Potassium 5.2 (H) 3.5 - 5.1 mmol/L   Chloride 103 98 - 111 mmol/L   CO2 25 22 - 32 mmol/L   Glucose, Bld 86 70 - 99 mg/dL   BUN 31 (H) 8 - 23 mg/dL   Creatinine, Ser 4.09 (H) 0.61 - 1.24 mg/dL   Calcium 81.1 8.9 - 91.4 mg/dL   Total  Protein 8.9 (H) 6.5 - 8.1 g/dL   Albumin 4.6 3.5 - 5.0 g/dL   AST 44 (H) 15 - 41 U/L   ALT 44 0 - 44 U/L   Alkaline Phosphatase 117 38 - 126 U/L  Total Bilirubin 0.6 <1.2 mg/dL   GFR, Estimated >16 >10 mL/min   Anion gap 7 5 - 15  CBC with Differential/Platelet  Result Value Ref Range   WBC 10.7 (H) 4.0 - 10.5 K/uL   RBC 3.66 (L) 4.22 - 5.81 MIL/uL   Hemoglobin 11.9 (L) 13.0 - 17.0 g/dL   HCT 96.0 (L) 45.4 - 09.8 %   MCV 98.1 80.0 - 100.0 fL   MCH 32.5 26.0 - 34.0 pg   MCHC 33.1 30.0 - 36.0 g/dL   RDW 11.9 14.7 - 82.9 %   Platelets 278 150 - 400 K/uL   nRBC 0.0 0.0 - 0.2 %   Neutrophils Relative % 62 %   Neutro Abs 6.7 1.7 - 7.7 K/uL   Lymphocytes Relative 25 %   Lymphs Abs 2.6 0.7 - 4.0 K/uL   Monocytes Relative 9 %   Monocytes Absolute 1.0 0.1 - 1.0 K/uL   Eosinophils Relative 2 %   Eosinophils Absolute 0.2 0.0 - 0.5 K/uL   Basophils Relative 1 %   Basophils Absolute 0.1 0.0 - 0.1 K/uL   Immature Granulocytes 1 %   Abs Immature Granulocytes 0.05 0.00 - 0.07 K/uL    RADIOGRAPHIC STUDIES:  I have personally reviewed the radiological images as listed and agree with the findings in the report.  CT Angio Chest PE W and/or Wo Contrast Result Date: 05/29/2023 CLINICAL DATA:  Septic arterial embolism. EXAM: CT ANGIOGRAPHY CHEST WITH CONTRAST TECHNIQUE: Multidetector CT imaging of the chest was performed using the standard protocol during bolus administration of intravenous contrast. Multiplanar CT image reconstructions and MIPs were obtained to evaluate the vascular anatomy. RADIATION DOSE REDUCTION: This exam was performed according to the departmental dose-optimization program which includes automated exposure control, adjustment of the mA and/or kV according to patient size and/or use of iterative reconstruction technique. CONTRAST:  75mL OMNIPAQUE IOHEXOL 350 MG/ML SOLN COMPARISON:  05/20/2023 chest CT. FINDINGS: Cardiovascular: Satisfactory opacification of the pulmonary  arteries to the segmental level. No evidence of pulmonary embolism. Normal heart size. No pericardial effusion. 3.8 cm diameter ascending aorta. Aortic atherosclerosis. Mediastinum/Nodes: No worrisome lymph node. Lungs/Pleura: Generalized airway thickening since prior. There is airway thickening that is focally marked in the posterior segment of the left upper lobe, largely acute when compared to recent prior. Patchy areas of ground-glass density in the bilateral lungs, mild. Branching density over the right diaphragm and nodular density over the left diaphragm, a pre-existing nodule measuring approximately 1 cm, three-month follow-up already recommended in the setting of active malignancy. Paraseptal emphysema at the apices. Upper Abdomen: Known hepatic metastatic disease.  No acute finding Musculoskeletal: No emergent finding. Review of the MIP images confirms the above findings. IMPRESSION: 1. Bronchitis with patchy areas of mild bronchopneumonia. 2. Known malignancy with pre-existing pulmonary nodule and recommended follow-up. 3. Atherosclerosis including the coronary arteries. Electronically Signed   By: Tiburcio Pea M.D.   On: 05/29/2023 04:40   DG Chest Portable 1 View Result Date: 05/29/2023 CLINICAL DATA:  Shortness of breath EXAM: PORTABLE CHEST 1 VIEW COMPARISON:  05/17/2023 FINDINGS: Heart and mediastinal contours are within normal limits. No focal opacities or effusions. No acute bony abnormality. IMPRESSION: No active disease. Electronically Signed   By: Charlett Nose M.D.   On: 05/29/2023 03:39   DG Swallowing Func-Speech Pathology Result Date: 05/24/2023 Table formatting from the original result was not included. Modified Barium Swallow Study Patient Details Name: Jordan Calderon MRN: [id number] Date of Birth: 16-Oct-1960 Today's Date:  05/24/2023 HPI/PMH: HPI: Jordan Calderon is a 62 yo male who presented [facility] on 11/27 with lightheadedness, R ear pain, R ptosis, HA, cough, n/v. 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 CT NECK FINDINGS:  Pharynx and larynx: There is a large right nasopharyngeal mass that  extends inferiorly to the level the greater horn of the hyoid bone  on right. This mass involves the right-sided adenoid and palatine  tonsils, the right parapharyngeal space, the right masticator space,  the right parotid space. Superiorly this mass likely extends to the  level of the foramen ovale bilaterally.  CT CHEST showed liver lesions concerning for mets; patulous esophagus. Results of biopsies pending. EGD 12/2: gastric ulcer, severe esophagitis, candidiasis. ENT and oncology following. PMH includes HIV on Hart therapy, HLD, depression, anxiety, AKI. Clinical Impression: Pt demonstrates mild oropharyngeal dysphagia characterized by mild residue in the vallecula and pyriform sinuses. There is appearance of mildly decreased hyoid excursion but very good laryngeal elevation. Pt may have decreased base of tongue retraction and decreased pharyngeal contraction that lead to pharyngeal residuals. The pt clears with an effortful swallow. Residue most significant with thin liquids. There was one instance of trace penetration post swallow with wet vocal quality and throat clear ejecting. Pt recommended to continue foods and drinks of choice. Suggested effortful swallows as a way to clear residue, but also to use as part of an initial home exercise program for pre radiation treatment (potential). Introduced concepts of radiation fibrosis, dysphagia and the importance of continueing swallowing. Pt not yet ready to absorb this and will need further education. Pt to f/u with OP SLP for further education and HEP. Factors that may increase risk of adverse event in presence of aspiration Rubye Oaks & Clearance Coots 2021): No data recorded Recommendations/Plan: Swallowing Evaluation Recommendations Swallowing Evaluation Recommendations Recommendations: PO diet  PO Diet Recommendation: Regular; Thin liquids (Level 0) Liquid Administration via: Cup; Straw Medication Administration: Whole meds with liquid Supervision: Patient able to self-feed Swallowing strategies  : effortful swallow Postural changes: Stay upright 30-60 min after meals Oral care recommendations: Oral care BID (2x/day) Treatment Plan Treatment Plan Treatment recommendations: Defer treatment plan to SLP at other venue (see follow-up recommendations) Follow-up recommendations: Outpatient SLP Recommendations Recommendations for follow up therapy are one component of a multi-disciplinary discharge planning process, led by the attending physician.  Recommendations may be updated based on patient status, additional functional criteria and insurance authorization. Assessment: Orofacial Exam: Orofacial Exam Oral Cavity - Dentition: Adequate natural dentition Anatomy: No data recorded Boluses Administered: Boluses Administered Boluses Administered: Thin liquids (Level 0); Mildly thick liquids (Level 2, nectar thick); Moderately thick liquids (Level 3, honey thick); Puree; Solid  Oral Impairment Domain: Oral Impairment Domain Lip Closure: No labial escape Tongue control during bolus hold: Cohesive bolus between tongue to palatal seal Bolus preparation/mastication: Timely and efficient chewing and mashing Bolus transport/lingual motion: Brisk tongue motion Oral residue: Complete oral clearance Location of oral residue : N/A Initiation of pharyngeal swallow : Posterior laryngeal surface of the epiglottis  Pharyngeal Impairment Domain: Pharyngeal Impairment Domain Soft palate elevation: No bolus between soft palate (SP)/pharyngeal wall (PW) Laryngeal elevation: Complete superior movement of thyroid cartilage with complete approximation of arytenoids to epiglottic petiole Anterior hyoid excursion: Partial anterior movement Epiglottic movement: Complete inversion Laryngeal vestibule closure: Complete, no air/contrast in  laryngeal vestibule Pharyngeal stripping wave : Present - diminished Pharyngeal contraction (A/P view only): N/A Pharyngoesophageal segment opening: Complete distension and complete duration, no  obstruction of flow Tongue base retraction: Trace column of contrast or air between tongue base and PPW Pharyngeal residue: Trace residue within or on pharyngeal structures; Collection of residue within or on pharyngeal structures Location of pharyngeal residue: Valleculae; Pyriform sinuses  Esophageal Impairment Domain: Esophageal Impairment Domain Esophageal clearance upright position: Esophageal retention Pill: Pill Consistency administered: Thin liquids (Level 0) Penetration/Aspiration Scale Score: No data recorded Compensatory Strategies: Compensatory Strategies Compensatory strategies: Yes Effortful swallow: Effective Chin tuck: Ineffective   General Information: Caregiver present: No  Diet Prior to this Study: Regular; Thin liquids (Level 0)   Temperature : Normal   Respiratory Status: WFL   Supplemental O2: None (Room air)   No data recorded Behavior/Cognition: Alert; Cooperative Self-Feeding Abilities: Able to self-feed Baseline vocal quality/speech: Dysphonic Volitional Cough: Able to elicit Volitional Swallow: Able to elicit No data recorded Goal Planning: No data recorded No data recorded No data recorded No data recorded No data recorded Pain: No data recorded End of Session: Start Time:SLP Start Time (ACUTE ONLY): 1115 Stop Time: SLP Stop Time (ACUTE ONLY): 1136 Time Calculation:SLP Time Calculation (min) (ACUTE ONLY): 21 min Charges: SLP Evaluations $ SLP Speech Visit: 1 Visit SLP Evaluations $BSS Swallow: 1 Procedure $MBS Swallow: 1 Procedure $Swallowing Treatment: 1 Procedure SLP visit diagnosis: SLP Visit Diagnosis: Dysphagia, oropharyngeal phase (R13.12) Past Medical History: Past Medical History: Diagnosis Date  Anxiety   Cellulitis 02/11/2014  LEFT ARM  Depression   Headache(784.0)   HIV (human  immunodeficiency virus infection) (HCC)  Past Surgical History: Past Surgical History: Procedure Laterality Date  ARM HARDWARE REMOVAL Left   arm surgery    HEMORRHOID SURGERY    NASOPHARYNGEAL BIOPSY N/A 05/19/2023  Procedure: NASOPHARYNGEAL BIOPSY;  Surgeon: Newman Pies, MD;  Location: [facility] OR;  Service: ENT;  Laterality: N/A; DeBlois, Riley Nearing 05/24/2023, 12:22 PM  CT ABDOMEN PELVIS W CONTRAST Result Date: 05/23/2023 CLINICAL DATA:  Abnormal neck and chest CT, nasopharyngeal mass EXAM: CT ABDOMEN AND PELVIS WITH CONTRAST TECHNIQUE: Multidetector CT imaging of the abdomen and pelvis was performed using the standard protocol following bolus administration of intravenous contrast. RADIATION DOSE REDUCTION: This exam was performed according to the departmental dose-optimization program which includes automated exposure control, adjustment of the mA and/or kV according to patient size and/or use of iterative reconstruction technique. CONTRAST:  75mL OMNIPAQUE IOHEXOL 350 MG/ML SOLN COMPARISON:  CT 05/20/2023 FINDINGS: Lower chest: See separately dictated recently performed chest CT. Left lung base pulmonary nodule measuring 6 mm on series 4, image 21. No acute airspace disease. Hepatobiliary: Multiple hypodense liver masses concerning for metastatic disease. Index right hepatic lobe mass lesion measures 2.8 x 2.3 cm on series 3, image 22. Index left hepatic lobe lesion measures 2.4 x 2.6 cm on series 3, image 31. Numerous additional hypodense liver masses. No calcified gallstone or biliary dilatation. Pancreas: Unremarkable. No pancreatic ductal dilatation or surrounding inflammatory changes. Spleen: Normal in size without focal abnormality. Adrenals/Urinary Tract: Adrenal glands are normal. Kidneys show no hydronephrosis. The bladder is unremarkable Stomach/Bowel: Stomach nonenlarged. Focal wall thickening of the mid gastric lumen, series 3, image 36 and coronal series 6, image 77. On sagittal views, ulcer crater  at the lesser curvature of stomach, series 7, image 87 and 88. No small bowel distension. No acute bowel wall thickening. Vascular/Lymphatic: Nonaneurysmal aorta with mild atherosclerosis. Mildly enlarged gastrohepatic lymph nodes measuring 14 x 14 mm on series 3, image 27. Negative for retroperitoneal or pelvic adenopathy. Reproductive: Prostate is unremarkable. Other: Negative for pelvic effusion  or free air for small fat containing inguinal hernias. Musculoskeletal: No acute or suspicious osseous abnormality. IMPRESSION: 1. Multiple hypodense liver masses concerning for metastatic disease. Mildly enlarged gastrohepatic lymph nodes also suspicious for metastatic disease. 2. Focal wall thickening of the mid gastric lumen with ulcer crater at the lesser curvature of stomach. Correlate with endoscopy if not already performed. 3. Left lung base pulmonary nodule measuring 6 mm. See separately dictated recently performed chest CT. 4. Aortic atherosclerosis. Aortic Atherosclerosis (ICD10-I70.0). Electronically Signed   By: Jasmine Pang M.D.   On: 05/23/2023 17:52   US BIOPSY (LIVER) Result Date: 05/23/2023 INDICATION: 62 year old with multiple liver lesions. Findings are suggestive for metastatic disease. Diagnosis is needed. EXAM: ULTRASOUND-GUIDED LIVER LESION BIOPSY MEDICATIONS: Moderate sedation ANESTHESIA/SEDATION: Moderate (conscious) sedation was employed during this procedure. A total of Versed 1 mg and Fentanyl 25 mcg was administered intravenously by the radiology nurse. Total intra-service moderate Sedation Time: 18 minutes. The patient's level of consciousness and vital signs were monitored continuously by radiology nursing throughout the procedure under my direct supervision. FLUOROSCOPY TIME:  None COMPLICATIONS: None immediate. PROCEDURE: Informed written consent was obtained from the patient after a thorough discussion of the procedural risks, benefits and alternatives. All questions were addressed.  Maximal Sterile Barrier Technique was utilized including caps, mask, sterile gowns, sterile gloves, sterile drape, hand hygiene and skin antiseptic. A timeout was performed prior to the initiation of the procedure. Abdomen was evaluated with ultrasound. A right hepatic lesion was targeted. The right side of the abdomen was prepped with chlorhexidine and sterile field was created. Skin was anesthetized using 1% lidocaine. Small incision was made. Using ultrasound guidance, 17 gauge coaxial needle was directed into a right hepatic lesion. Total of 3 core biopsies were performed with an 18 gauge core device. Three adequate specimens obtained and placed in formalin. 17 gauge coaxial needle was removed without complication. Bandage placed over the puncture site. FINDINGS: Several hyperechoic lesions scattered throughout the liver. A lesion in the right hepatic lobe was targeted. Biopsy needle confirmed within the lesion. Three adequate specimens obtained. No immediate bleeding or hematoma formation. IMPRESSION: Successful ultrasound-guided core biopsies from a right hepatic lesion. Electronically Signed   By: Richarda Overlie M.D.   On: 05/23/2023 10:22   CT CHEST W CONTRAST Result Date: 05/20/2023 CLINICAL DATA:  Staging colon cancer. New nasopharyngeal mass. * Tracking Code: BO * EXAM: CT CHEST WITH CONTRAST TECHNIQUE: Multidetector CT imaging of the chest was performed during intravenous contrast administration. RADIATION DOSE REDUCTION: This exam was performed according to the departmental dose-optimization program which includes automated exposure control, adjustment of the mA and/or kV according to patient size and/or use of iterative reconstruction technique. CONTRAST:  50mL OMNIPAQUE IOHEXOL 350 MG/ML SOLN COMPARISON:  None Available. FINDINGS: Cardiovascular: Heart is nonenlarged. Coronary artery calcifications are seen. Please correlate for other coronary risk factors. Diameter of the ascending aorta measures 4.4  by 4.4 cm at the level of the right pulmonary artery. More normal caliber arch and descending thoracic aorta. Mild vascular calcifications along the aorta. Mediastinum/Nodes: Preserved thyroid gland. Slightly patulous thoracic esophagus with some luminal air and fluid. No specific abnormal lymph node enlargement present in the axillary region, hilum or mediastinum. A few small less than 1 cm size mediastinal and right hilar nodes are seen, nonpathologic by size criteria. Lungs/Pleura: No consolidation, pneumothorax or effusion. Slight breathing motion. Left lower lobe lung nodule identified measuring 8 by 3 mm on series 4, image 136. Similar focus in the  right lung base measuring 7 mm on series 4 image 123. Few other tiny nodules in the right lung such as series 4, image 119 in the lower lobe. Few patchy areas of ground-glass type areas are seen as well. Apical pleural thickening identified with some paraseptal and centrilobular emphysematous changes. Some areas of interstitial septal thickening as well. Upper Abdomen: Multiple liver masses are identified. At least 15 lesions are seen. Example in segment 2 on series 3, image 147 measures 2.7 by 2.6 cm. Middle lobe lesion on series 3, image 174 measures 2.7 by 2.7 cm. Adrenal glands are preserved. There also abnormal nodes identified such as lesser curve of the stomach measuring 16 x 20 mm. Musculoskeletal: Mild degenerative changes along the spine. IMPRESSION: Numerous liver metastases. Upper abdominal abnormal nodes. Further workup of the abdomen and pelvis may be useful when clinically appropriate if there is no known previous. Patulous esophagus with slight wall thickening. Please correlate with symptoms and additional evaluation when clinically appropriate. Small mediastinal lymph nodes.  Attention on follow-up. Small lung nodules measuring up to 8 mm. Recommend follow up in 3 months or as per the patient's primary neoplasm. Coronary artery calcifications. In  addition dilatation of the ascending aorta. Recommend annual imaging followup by CTA or MRA. This recommendation follows 2010 ACCF/AHA/AATS/ACR/ASA/SCA/SCAI/SIR/STS/SVM Guidelines for the Diagnosis and Management of Patients with Thoracic Aortic Disease. Circulation. 2010; 121: X324-M010. Aortic aneurysm NOS (ICD10-I71.9) Aortic Atherosclerosis (ICD10-I70.0) and Emphysema (ICD10-J43.9). Electronically Signed   By: Karen Kays M.D.   On: 05/20/2023 14:09   CT SOFT TISSUE NECK W CONTRAST Result Date: 05/18/2023 CLINICAL DATA:  Neck mass EXAM: CT NECK WITH CONTRAST TECHNIQUE: Multidetector CT imaging of the neck was performed using the standard protocol following the bolus administration of intravenous contrast. RADIATION DOSE REDUCTION: This exam was performed according to the departmental dose-optimization program which includes automated exposure control, adjustment of the mA and/or kV according to patient size and/or use of iterative reconstruction technique. CONTRAST:  75mL OMNIPAQUE IOHEXOL 350 MG/ML SOLN COMPARISON:  CTA head/neck 05/17/23 FINDINGS: Pharynx and larynx: There is a large right nasopharyngeal mass that extends inferiorly to the level the greater horn of the hyoid bone on right. This mass involves the right-sided adenoid and palatine tonsils, the right parapharyngeal space, the right masticator space, the right parotid space. Superiorly this mass likely extends to the level of the foramen ovale bilaterally. Salivary glands: The lateral extension of the large nasopharyngeal mass abuts the right parotid gland in the superior aspect of the right submandibular gland. There is a 9 mm hypodense lesion in the left parotid gland. Thyroid: Normal. Lymph nodes: There is a 9 mm hypodense lesion in the left parotid gland (series 2, image 46). It is unclear if this represents a necrotic lymph node or an intraparotid cyst. Vascular: Negative. Limited intracranial: Negative. Visualized orbits: Negative.  Mastoids and visualized paranasal sinuses: No middle ear or mastoid effusion. Paranasal sinuses are clear. Skeleton: No acute or aggressive process. Upper chest: Negative. Other: None IMPRESSION: 1. Large right nasopharyngeal mass that extends inferiorly to the level the greater horn of the hyoid bone on the right. This mass involves the right-sided adenoid and palatine tonsils, the right parapharyngeal space, the right masticator space, the right parotid space. Superiorly this mass likely extends to the level of the foramen ovale bilaterally. 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 Recommend ENT consultation. 2. A 9 mm hypodense lesion in the left parotid gland. It is unclear if this represents  a necrotic lymph node or an intraparotid cyst. Electronically Signed   By: Lorenza Cambridge M.D.   On: 05/18/2023 11:31   MR ANGIO HEAD WO CONTRAST Result Date: 05/17/2023 CLINICAL DATA:  Initial evaluation for persistent dizziness. EXAM: MRA HEAD WITHOUT CONTRAST TECHNIQUE: Angiographic images of the Circle of Willis were acquired using MRA technique without intravenous contrast. COMPARISON:  CTA from earlier the same day. FINDINGS: Anterior circulation: Both internal carotid arteries are widely patent through the siphons without stenosis or other abnormality. Again seen is a 3 mm outpouching extending posteriorly and laterally from the left ICA terminus (series 2, image 93). Possible small vessel emanating from its apex, not entirely certain. Finding could reflect an vascular infundibulum versus aneurysm. A1 segments patent bilaterally, with the right dominant. Normal anterior communicating artery complex. Anterior cerebral arteries patent without stenosis. No M1 stenosis or occlusion. Distal MCA branches perfused and symmetric. Posterior circulation: Visualized V4 segments patent without stenosis. Right PICA patent. Left PICA origin not seen. Basilar patent without stenosis. Superior cerebellar  and posterior cerebral arteries widely patent bilaterally. Anatomic variants: As above. Other: Previously identified mass at the right skull base noted, partially visualized, better evaluated on prior CT. IMPRESSION: 1. 3 mm outpouching extending posteriorly and laterally from the left ICA terminus, potentially reflecting an vascular infundibulum versus aneurysm. Attention at follow-up recommended. 2. Otherwise normal intracranial MRA. No large vessel occlusion or other emergent finding. Electronically Signed   By: Rise Mu M.D.   On: 05/17/2023 20:58   CT ANGIO HEAD NECK W WO CM Result Date: 05/17/2023 CLINICAL DATA:  Cerebral vasospasm suspected EXAM: CT ANGIOGRAPHY HEAD AND NECK WITH AND WITHOUT CONTRAST TECHNIQUE: Multidetector CT imaging of the head and neck was performed using the standard protocol during bolus administration of intravenous contrast. Multiplanar CT image reconstructions and MIPs were obtained to evaluate the vascular anatomy. Carotid stenosis measurements (when applicable) are obtained utilizing NASCET criteria, using the distal internal carotid diameter as the denominator. RADIATION DOSE REDUCTION: This exam was performed according to the departmental dose-optimization program which includes automated exposure control, adjustment of the mA and/or kV according to patient size and/or use of iterative reconstruction technique. CONTRAST:  75mL OMNIPAQUE IOHEXOL 350 MG/ML SOLN COMPARISON:  None Available. FINDINGS: CT HEAD FINDINGS Brain: No evidence of acute infarction, hemorrhage, hydrocephalus, extra-axial collection or intraparenchymal mass lesion/mass effect. Vascular: See below. Skull: No acute fracture. Sinuses/Orbits: Clear sinuses.  No acute orbital findings. Other: No mastoid effusions. Review of the MIP images confirms the above findings CTA NECK FINDINGS Aortic arch: Great vessel origins are patent without significant stenosis. Right carotid system: No evidence of  dissection, stenosis (50% or greater), or occlusion. Left carotid system: No evidence of dissection, stenosis (50% or greater), or occlusion. Mild atherosclerosis at the carotid bifurcation. Vertebral arteries: Codominant. No evidence of dissection, stenosis (50% or greater), or occlusion. Skeleton: No acute abnormality on limited assessment. 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 Review of the MIP images confirms the above findings CTA HEAD FINDINGS Anterior circulation: Bilateral intracranial ICAs, MCAs, and ACAs are patent without proximal hemodynamically significant stenosis. Hypoplastic left A1 ACA, likely congenital. Approximately 2 mm posteriorly inferiorly directed outpouching arising from the left carotid terminus (for example see series 605, image 111 and series 604, image 154 Posterior circulation: Tortuous vertebral arteries. The vertebral arteries, basilar artery and bilateral posterior cerebral arteries are patent without proximal hemodynamically significant stenosis. Venous sinuses: As permitted by  contrast timing, patent. Anatomic variants: Described above. Review of the MIP images confirms the above findings IMPRESSION: 1. 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 Recommend ENT consultation, correlation with direct inspection, and follow-up dedicated CT of the neck with contrast for better characterization. 2. No emergent large vessel occlusion or proximal hemodynamically significant stenosis. 3. Approximately 2 mm posteriorly inferiorly directed outpouching arising from the left carotid terminus which could represent an infundibulum with vessel not well seen versus aneurysm. 4. Aortic Atherosclerosis (ICD10-I70.0) and Emphysema (ICD10-J43.9). Electronically Signed   By: Feliberto Harts M.D.   On:  05/17/2023 14:55   DG Chest Port 1 View Result Date: 05/17/2023 CLINICAL DATA:  Worsening cough and dry heaving with shortness of breath EXAM: PORTABLE CHEST 1 VIEW COMPARISON:  Chest radiograph dated 05/11/2023 FINDINGS: Normal lung volumes. No focal consolidations. No pleural effusion or pneumothorax. The heart size and mediastinal contours are within normal limits. No acute osseous abnormality. IMPRESSION: No active disease. Electronically Signed   By: Agustin Cree M.D.   On: 05/17/2023 14:04   ECHOCARDIOGRAM COMPLETE Result Date: 05/12/2023    ECHOCARDIOGRAM REPORT   Patient Name:   Jordan Calderon Calderon Date of Exam: 05/12/2023 Medical Rec #:  [id number]       Height:       69.0 in Accession #:    [id number]      Weight:       154.6 lb Date of Birth:  Apr 22, 1961       BSA:          1.852 m Patient Age:    62 years        BP:           124/67 mmHg Patient Gender: M               HR:           73 bpm. Exam Location:  Inpatient Procedure: 2D Echo, Color Doppler and Cardiac Doppler Indications:    Syncope  History:        Patient has no prior history of Echocardiogram examinations.                 Signs/Symptoms:Syncope; Risk Factors:HIV, Polysubstance Abuse                 and Former Smoker.  Sonographer:    Milbert Coulter Referring Phys: [id number] JARED M GARDNER IMPRESSIONS  1. Left ventricular ejection fraction, by estimation, is 60 to 65%. The left ventricle has normal function. The left ventricle has no regional wall motion abnormalities. Left ventricular diastolic parameters were normal.  2. Right ventricular systolic function is normal. The right ventricular size is normal. There is normal pulmonary artery systolic pressure.  3. The mitral valve is normal in structure. No evidence of mitral valve regurgitation. No evidence of mitral stenosis.  4. The aortic valve is tricuspid. There is mild calcification of the aortic valve. There is mild thickening of the aortic valve. Aortic valve regurgitation is not visualized.  Aortic valve sclerosis/calcification is present, without any evidence of aortic stenosis.  5. Aortic dilatation noted. There is mild dilatation of the ascending aorta, measuring 40 mm.  6. The inferior vena cava is normal in size with greater than 50% respiratory variability, suggesting right atrial pressure of 3 mmHg. Comparison(s): No prior Echocardiogram. Conclusion(s)/Recommendation(s): Otherwise normal echocardiogram, with minor abnormalities described in the report. Mild dilation of ascending aorta. FINDINGS  Left  Ventricle: Left ventricular ejection fraction, by estimation, is 60 to 65%. The left ventricle has normal function. The left ventricle has no regional wall motion abnormalities. The left ventricular internal cavity size was normal in size. There is  no left ventricular hypertrophy. Left ventricular diastolic parameters were normal. Right Ventricle: The right ventricular size is normal. No increase in right ventricular wall thickness. Right ventricular systolic function is normal. There is normal pulmonary artery systolic pressure. The tricuspid regurgitant velocity is 2.54 m/s, and  with an assumed right atrial pressure of 3 mmHg, the estimated right ventricular systolic pressure is 28.8 mmHg. Left Atrium: Left atrial size was normal in size. Right Atrium: Right atrial size was normal in size. Prominent Chiari network. Pericardium: There is no evidence of pericardial effusion. Mitral Valve: The mitral valve is normal in structure. No evidence of mitral valve regurgitation. No evidence of mitral valve stenosis. Tricuspid Valve: The tricuspid valve is normal in structure. Tricuspid valve regurgitation is mild . No evidence of tricuspid stenosis. Aortic Valve: The aortic valve is tricuspid. There is mild calcification of the aortic valve. There is mild thickening of the aortic valve. Aortic valve regurgitation is not visualized. Aortic valve sclerosis/calcification is present, without any evidence of  aortic stenosis. Aortic valve mean gradient measures 4.0 mmHg. Aortic valve peak gradient measures 8.3 mmHg. Aortic valve area, by VTI measures 4.09 cm. Pulmonic Valve: The pulmonic valve was grossly normal. Pulmonic valve regurgitation is trivial. No evidence of pulmonic stenosis. Aorta: Aortic dilatation noted. There is mild dilatation of the ascending aorta, measuring 40 mm. Venous: The inferior vena cava is normal in size with greater than 50% respiratory variability, suggesting right atrial pressure of 3 mmHg. IAS/Shunts: No atrial level shunt detected by color flow Doppler.  LEFT VENTRICLE PLAX 2D LVIDd:         4.60 cm   Diastology LVIDs:         3.10 cm   LV e' medial:    7.18 cm/s LV PW:         1.10 cm   LV E/e' medial:  6.4 LV IVS:        1.20 cm   LV e' lateral:   16.00 cm/s LVOT diam:     2.40 cm   LV E/e' lateral: 2.8 LV SV:         94 LV SV Index:   51 LVOT Area:     4.52 cm  RIGHT VENTRICLE RV Basal diam:  3.00 cm RV Mid diam:    2.80 cm RV S prime:     13.60 cm/s TAPSE (M-mode): 2.0 cm LEFT ATRIUM             Index        RIGHT ATRIUM           Index LA diam:        3.80 cm 2.05 cm/m   RA Area:     13.10 cm LA Vol (A2C):   34.3 ml 18.52 ml/m  RA Volume:   29.50 ml  15.93 ml/m LA Vol (A4C):   45.1 ml 24.35 ml/m LA Biplane Vol: 40.7 ml 21.98 ml/m  AORTIC VALVE AV Area (Vmax):    3.96 cm AV Area (Vmean):   3.80 cm AV Area (VTI):     4.09 cm AV Vmax:           144.00 cm/s AV Vmean:          94.700 cm/s AV VTI:  0.230 m AV Peak Grad:      8.3 mmHg AV Mean Grad:      4.0 mmHg LVOT Vmax:         126.00 cm/s LVOT Vmean:        79.600 cm/s LVOT VTI:          0.208 m LVOT/AV VTI ratio: 0.90  AORTA Ao Root diam: 4.00 cm Ao Asc diam:  4.00 cm MITRAL VALVE               TRICUSPID VALVE MV Area (PHT): 2.33 cm    TR Peak grad:   25.8 mmHg MV Decel Time: 325 msec    TR Vmax:        254.00 cm/s MV E velocity: 45.60 cm/s MV A velocity: 81.20 cm/s  SHUNTS MV E/A ratio:  0.56        Systemic VTI:   0.21 m                            Systemic Diam: 2.40 cm Jodelle Red MD Electronically signed by Jodelle Red MD Signature Date/Time: 05/12/2023/5:59:52 PM    Final    DG Chest Portable 1 View Result Date: 05/11/2023 CLINICAL DATA:  Cough. EXAM: PORTABLE CHEST 1 VIEW COMPARISON:  07/01/2018. FINDINGS: Bilateral lung fields are clear. No consolidation or lung collapse. No pulmonary edema. Bilateral costophrenic angles are clear. Normal cardio-mediastinal silhouette. No acute osseous abnormalities. The soft tissues are within normal limits. IMPRESSION: *No active disease. Electronically Signed   By: Jules Schick M.D.   On: 05/11/2023 13:47   CT Head Wo Contrast Result Date: 05/11/2023 CLINICAL DATA:  Headache, new onset (Age >= 51y) EXAM: CT HEAD WITHOUT CONTRAST TECHNIQUE: Contiguous axial images were obtained from the base of the skull through the vertex without intravenous contrast. RADIATION DOSE REDUCTION: This exam was performed according to the departmental dose-optimization program which includes automated exposure control, adjustment of the mA and/or kV according to patient size and/or use of iterative reconstruction technique. COMPARISON:  None Available. FINDINGS: Brain: No evidence of acute infarction, hemorrhage, hydrocephalus, extra-axial collection or mass lesion/mass effect. Vascular: No hyperdense vessel or unexpected calcification. Skull: Normal. Negative for fracture or focal lesion. Sinuses/Orbits: No middle ear or mastoid effusion. Paranasal sinuses are notable for mucosal thickening of the floor of the right maxillary sinus. Orbits are unremarkable. Other: None. IMPRESSION: No acute intracranial abnormality. Electronically Signed   By: Lorenza Cambridge M.D.   On: 05/11/2023 12:33    CODE STATUS:  Code Status History     Date Active Date Inactive Code Status Order ID Comments User Context   05/29/2023 0850 05/30/2023 2252 Full Code [id number]  Maryln Gottron, MD  ED   05/17/2023 1906 05/24/2023 1834 Full Code [id number]  Rometta Emery, MD Inpatient   05/17/2023 1532 05/17/2023 1906 Full Code [id number]  Nolberto Hanlon, MD ED   05/11/2023 2024 05/12/2023 2308 Full Code [id number]  Hillary Bow, DO Inpatient   07/01/2018 1624 07/03/2018 1537 Full Code [id number]  Dorcas Carrow, MD Inpatient   02/10/2014 2054 02/12/2014 0031 Full Code [id number]  Christen Bame, MD Inpatient    Questions for Most Recent Historical Code Status (Order [id number])     Question Answer   By: Consent: discussion documented in EHR            Orders Placed This Encounter  Procedures   Consent Attestation for Oncology Treatment    The  patient is informed of risks, benefits, side-effects of the prescribed oncology treatment. Potential short term and long term side effects and response rates discussed. After a long discussion, the patient made informed decision to proceed.:   Yes   NM PET Image Initial (PI) Skull Base To Thigh    Standing Status:   Future    Number of Occurrences:   1    Expected Date:   06/07/2023    Expiration Date:   06/01/2024    If indicated for the ordered procedure, I authorize the administration of a radiopharmaceutical per Radiology protocol:   Yes    Preferred imaging location?:   [facility]   DG FL GUIDED LUMBAR PUNCTURE    Flow cytometry    Standing Status:   Future    Expected Date:   06/06/2023    Expiration Date:   06/01/2024    Lab orders requested (DO NOT place separate lab orders, these will be automatically ordered during procedure specimen collection)::   CSF cell count w differential    Lab orders requested (DO NOT place separate lab orders, these will be automatically ordered during procedure specimen collection)::   Cytology - Non Pap    Lab orders requested (DO NOT place separate lab orders, these will be automatically ordered during procedure specimen collection)::   Other    Reason for Exam (SYMPTOM  OR DIAGNOSIS REQUIRED):   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 He has port-a-cath scheduled on 06/06/23. Please schedule LP around the same time.    Preferred Imaging Location?:   [facility]   CBC with Differential/Platelet    Standing Status:   Future    Number of Occurrences:   1    Expiration Date:   06/01/2024   Comprehensive metabolic panel    Standing Status:   Future    Number of Occurrences:   1    Expiration Date:   06/01/2024   Lactate dehydrogenase    Standing Status:   Future    Number of Occurrences:   1    Expiration Date:   06/01/2024   Hepatitis B surface antigen    Standing Status:   Future    Number of Occurrences:   1    Expiration Date:   06/01/2024   Hepatitis B core antibody, total    Standing Status:   Future    Number of Occurrences:   1    Expiration Date:   06/01/2024   Iron and Iron Binding Capacity ([facility] only)    Standing Status:   Future    Number of Occurrences:   1    Expiration Date:   06/01/2024   Ferritin    Standing Status:   Future    Number of Occurrences:   1    Expiration Date:   06/01/2024   Vitamin B12    Standing Status:   Future    Number of Occurrences:   1    Expiration Date:   06/01/2024   Folate    Standing Status:   Future    Number of Occurrences:   1    Expiration Date:   06/01/2024   CBC with Differential (Cancer Center Only)    Standing Status:   Future    Expected Date:   06/09/2023    Expiration Date:   06/08/2024   CMP (Cancer Center only)    Standing Status:   Future    Expected Date:   06/09/2023  Expiration Date:   06/08/2024   Hepatitis B surface antigen    Standing Status:   Standing    Number of Occurrences:   1    Expiration Date:   06/01/2024   Hepatitis B core antibody, total    Standing Status:   Standing    Number of Occurrences:   1    Expiration Date:   06/01/2024   PHYSICIAN COMMUNICATION ORDER    Hepatitis B Virus screening with HBsAg and anti-HBc recommended prior to treatment with  rituximab, ofatumumab, or obinutuzumab.   PHYSICIAN COMMUNICATION ORDER    A baseline Echo/ Muga should be obtained prior to initiation of Anthracycline Chemotherapy     Future Appointments  Date Time Provider Department Center  06/06/2023 11:30 AM [facility] ROOM [facility] None  06/06/2023  1:30 PM [facility]-IR 1 [facility]IR [place]  06/07/2023  2:00 PM [facility] CHEMO EDU [facility] None  06/08/2023  9:00 AM [facility]-R/F 1 [facility]DG [facility]  06/09/2023  8:30 AM [facility] FLUSH [facility] None  06/09/2023  9:30 AM [facility] INFUSION [facility] None  06/09/2023  9:45 AM Valory Wetherby, MD [facility] None  06/12/2023  2:30 PM [facility] FLUSH [facility] None  06/28/2023  9:15 AM [facility] FLUSH [facility] None  06/28/2023  9:45 AM Monserat Prestigiacomo, MD [facility] None  06/28/2023 10:30 AM [facility] INFUSION [facility] None  06/30/2023  2:15 PM [facility] FLUSH [facility] None  08/23/2023  9:30 AM Comer, Belia Heman, MD [facility]  08/30/2023 10:10 AM Armbruster, [name], MD [facility]      This document was completed utilizing speech recognition software. Grammatical errors, random word insertions, pronoun errors, and incomplete sentences are an occasional consequence of this system due to software limitations, ambient noise, and hardware issues. Any formal questions or concerns about the content, text or information contained within the body of this dictation should be directly addressed to the provider for clarification.

## 2023-06-02 NOTE — Progress Notes (Unsigned)
New patient visit   Patient: Jordan Calderon   DOB: 02-16-1961   62 y.o. Male  MRN: [id number] Visit Date: 06/05/2023  Today's healthcare provider: Alfredia Ferguson, PA-C   No chief complaint on file.  Subjective    Jordan Calderon is a 62 y.o. male who presents today as a new patient to establish care.   Pt was hospitalized from 05/29/23-05/30/23. Previous to this, he was hospitalized from 11/27-12/4 for a syncopal episode, AKI. Pt complained of headaches, CT head neck demonstrated a large skull mass. 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 Pt has f/u with oncology scheduled 06/02/23.   EGD with esophagitis, gastric ulcer. Started PPI bid and fluconazole.   After discharge 12/4 he began to has respiratory symptoms, CT found to have bronchopneumonia, tx with IV abx, discharged with 7 days of levaquin.   Past Medical History:  Diagnosis Date   Anxiety    Cellulitis 02/11/2014   LEFT ARM   Depression    Headache(784.0)    HIV (human immunodeficiency virus infection) (HCC)    Past Surgical History:  Procedure Laterality Date   ARM HARDWARE REMOVAL Left    arm surgery     BIOPSY  05/22/2023   Procedure: BIOPSY;  Surgeon: Benancio Deeds, MD;  [facility];  Service: Gastroenterology;;   ESOPHAGOGASTRODUODENOSCOPY (EGD) WITH PROPOFOL N/A 05/22/2023   Procedure: ESOPHAGOGASTRODUODENOSCOPY (EGD) WITH PROPOFOL;  Surgeon: Benancio Deeds, MD;  Location[facility] ENDOSCOPY;  Service: Gastroenterology;  Laterality: N/A;   HEMORRHOID SURGERY     NASOPHARYNGEAL BIOPSY N/A 05/19/2023   Procedure: NASOPHARYNGEAL BIOPSY;  Surgeon: Newman Pies, MD;  Location: [facility] OR;  Service: ENT;  Laterality: N/A;   No family status information on file.   Family History  Adopted: Yes   Social History   Socioeconomic History   Marital status: Single    Spouse name: Not on file   Number of children: Not on file   Years of education: Not on file    Highest education level: Not on file  Occupational History   Not on file  Tobacco Use   Smoking status: Former    Current packs/day: 0.00    Average packs/day: 0.1 packs/day for 30.0 years (3.0 ttl pk-yrs)    Types: Cigarettes    Start date: 11/18/1984    Quit date: 11/19/2014    Years since quitting: 8.5   Smokeless tobacco: Never  Vaping Use   Vaping status: Never Used  Substance and Sexual Activity   Alcohol use: No    Alcohol/week: 0.0 standard drinks of alcohol   Drug use: Not Currently   Sexual activity: Not Currently    Comment: refused condoms  Other Topics Concern   Not on file  Social History Narrative   Not on file   Social Drivers of Health   Financial Resource Strain: Not on file  Food Insecurity: No Food Insecurity (05/29/2023)   Hunger Vital Sign    Worried About Running Out of Food in the Last Year: Never true    Ran Out of Food in the Last Year: Never true  Transportation Needs: No Transportation Needs (05/29/2023)   PRAPARE - Administrator, Civil Service (Medical): No    Lack of Transportation (Non-Medical): No  Physical Activity: Not on file  Stress: Not on file  Social Connections: Not on file   Outpatient Medications Prior to Visit  Medication Sig   calcium carbonate (  TUMS - DOSED IN MG ELEMENTAL CALCIUM) 500 MG chewable tablet Chew 1 tablet by mouth 3 (three) times daily with meals.   dapsone 100 MG tablet Take 1 tablet (100 mg total) by mouth daily. (Patient taking differently: Take 100 mg by mouth daily with breakfast.)   Darunavir-Cobicistat-Emtricitabine-Tenofovir Alafenamide (SYMTUZA) 800-150-200-10 MG TABS Take 1 tablet by mouth daily with breakfast.   fluconazole (DIFLUCAN) 200 MG tablet Take 1 tablet (200 mg total) by mouth daily.   ibuprofen (ADVIL) 200 MG tablet Take 200-400 mg by mouth every 6 (six) hours as needed for mild pain (pain score 1-3).   levofloxacin (LEVAQUIN) 750 MG tablet Take 1 tablet (750 mg total) by mouth daily  for 7 days.   pantoprazole (PROTONIX) 40 MG tablet Take 1 tablet (40 mg total) by mouth 2 (two) times daily before a meal.   rosuvastatin (CRESTOR) 10 MG tablet Take 1 tablet (10 mg total) by mouth daily. (Patient not taking: Reported on 05/17/2023)   No facility-administered medications prior to visit.   Allergies  Allergen Reactions   Sulfonamide Derivatives Hives and Itching    Immunization History  Administered Date(s) Administered   Hepatitis B 02/19/2004, 03/16/2004, 08/10/2004   Influenza Split 03/24/2011, 04/10/2012   Influenza Whole 03/28/2006, 03/21/2007, 03/14/2008, 03/23/2009, 03/16/2010   Influenza,inj,Quad PF,6+ Mos 06/04/2013, 04/03/2014, 02/17/2015, 03/18/2016, 03/14/2017, 05/01/2018, 03/11/2019, 04/24/2020, 04/28/2022   Influenza-Unspecified 03/21/2023   PFIZER(Purple Top)SARS-COV-2 Vaccination 09/14/2019, 09/25/2019, 04/24/2020   Pfizer Covid-19 Vaccine Bivalent Booster 85yrs & up 03/21/2023   Pfizer(Comirnaty)Fall Seasonal Vaccine 12 years and older 04/28/2022   Pneumococcal Conjugate-13 08/01/2018   Pneumococcal Polysaccharide-23 09/20/2005, 04/22/2011   RSV,unspecified 03/21/2023   Tdap 08/15/2021    Health Maintenance  Topic Date Due   Medicare Annual Wellness (AWV)  Never done   Zoster Vaccines- Shingrix (1 of 2) Never done   Colonoscopy  Never done   COVID-19 Vaccine (6 - 2024-25 season) 05/16/2023   DTaP/Tdap/Td (2 - Td or Tdap) 08/16/2031   INFLUENZA VACCINE  Completed   Hepatitis C Screening  Completed   HIV Screening  Completed   HPV VACCINES  Aged Out    Patient Care Team: Pcp, No as PCP - General [name], MD (Inactive) as PCP - Infectious Diseases (Infectious Diseases) Roderic Scarce, LCSW as Counselor  Review of Systems  {Insert previous labs (optional):23779} {See past labs  Heme  Chem  Endocrine  Serology  Results Review (optional):1}   Objective    There were no vitals taken for this visit. {Insert last BP/Wt  (optional):23777}{See vitals history (optional):1}   Physical Exam ***  Depression Screen    04/19/2023   10:37 AM 01/17/2023    9:00 AM 09/07/2022    9:44 AM 07/07/2022    9:36 AM  PHQ 2/9 Scores  PHQ - 2 Score 0 0 0 0   No results found for any visits on 06/05/23.  Assessment & Plan     There are no diagnoses linked to this encounter.   No follow-ups on file.      Alfredia Ferguson, PA-C  [facility] at [facility] [phone] (phone) [phone] (fax)  [facility]

## 2023-06-03 ENCOUNTER — Other Ambulatory Visit: Payer: Self-pay

## 2023-06-03 LAB — CULTURE, BLOOD (ROUTINE X 2)
Culture: NO GROWTH
Culture: NO GROWTH
Special Requests: ADEQUATE

## 2023-06-03 LAB — HEPATITIS B CORE ANTIBODY, TOTAL: Hep B Core Total Ab: REACTIVE — AB

## 2023-06-05 ENCOUNTER — Encounter (HOSPITAL_COMMUNITY): Payer: Self-pay | Admitting: Specialist

## 2023-06-05 ENCOUNTER — Encounter: Payer: Self-pay | Admitting: Oncology

## 2023-06-05 ENCOUNTER — Encounter (HOSPITAL_COMMUNITY)
Admission: RE | Admit: 2023-06-05 | Discharge: 2023-06-05 | Disposition: A | Discharge status: Home / Self Care | Payer: Medicare Other | Source: Ambulatory Visit | Attending: Oncology | Admitting: Oncology

## 2023-06-05 ENCOUNTER — Other Ambulatory Visit: Payer: Self-pay | Admitting: Radiology

## 2023-06-05 ENCOUNTER — Ambulatory Visit: Payer: Medicare Other | Admitting: Physician Assistant

## 2023-06-05 DIAGNOSIS — G893 Neoplasm related pain (acute) (chronic): Secondary | ICD-10-CM | POA: Insufficient documentation

## 2023-06-05 DIAGNOSIS — C8338 Diffuse large B-cell lymphoma, lymph nodes of multiple sites: Secondary | ICD-10-CM | POA: Insufficient documentation

## 2023-06-05 LAB — GLUCOSE, CAPILLARY: Glucose-Capillary: 90 mg/dL (ref 70–99)

## 2023-06-05 MED ORDER — FLUDEOXYGLUCOSE F - 18 (FDG) INJECTION
8.0000 | Freq: Once | INTRAVENOUS | Status: AC | PRN
  Administered 2023-06-05: 8.07 via INTRAVENOUS

## 2023-06-05 NOTE — Assessment & Plan Note (Signed)
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 Plan to monitor response of mass to lymphoma treatment.  -Discussed diagnosis, prognosis, staging, plan of care, treatment options.  Reviewed NCCN guidelines and discussed management options.  -Ideally he would benefit from dose adjusted R-EPOCH regimen.  However he does have poor social support and fatigue which is chronic from his multiple comorbidities.  Hence a decision was made to start him on R-CHOP chemotherapy initially and depending on tolerability, we will consider escalation to DA-R-EPOCH regimen.  Presence of tremor and severe headache suggest possible CNS involvement.  -Perform lumbar puncture to check for lymphoma in the spinal fluid.  Request submitted to IR for the same.  If present, initiate separate CNS-directed chemotherapy.  -He is currently scheduled for Port-A-Cath placement next week.  Will obtain PET/CT for staging.  -Will arrange for chemo education followed by chemo initiation within the next week.  I will see him approximately 7 to 10 days later for toxicity evaluation.

## 2023-06-05 NOTE — Assessment & Plan Note (Signed)
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 -Start Tramadol for pain management, up to three times a day as needed.

## 2023-06-05 NOTE — Assessment & Plan Note (Signed)
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  Could also be from his medications.  -Iron studies grossly unremarkable today.  B12, folate are within normal limits.  Will monitor this during treatments.  Hemoglobin is overall stable at 11.9 today.

## 2023-06-05 NOTE — Assessment & Plan Note (Signed)
-   Currently under reasonable control.  Has close follow-up with ID.  His HIV medications may need to be adjusted once he is initiated on chemotherapy.  Will work with pharmacy regarding this.

## 2023-06-05 NOTE — H&P (Signed)
Referring Physician(s): Pasam,Avinash  Supervising Physician: Gilmer Mor  Patient Status:  [facility] OP  Chief Complaint:  "I'm getting a port a cath"  Subjective: Patient familiar to IR service from right liver lesion biopsy on 05/23/2023. He is a 62 year old male with past medical history significant for anxiety, anemia, depression, HIV who presents now with newly diagnosed high-grade B-cell lymphoma.  He is scheduled today for Port-A-Cath placement to assist with treatment. He currently denies fever, cough, abdominal/back pain, nausea, vomiting or bleeding. He does have mild HA.   Past Medical History:  Diagnosis Date   Anxiety    Cellulitis 02/11/2014   LEFT ARM   Depression    Headache(784.0)    HIV (human immunodeficiency virus infection) (HCC)    Past Surgical History:  Procedure Laterality Date   ARM HARDWARE REMOVAL Left    arm surgery     BIOPSY  05/22/2023   Procedure: BIOPSY;  Surgeon: Benancio Deeds, MD;  [facility]: [facility] END[facility];  Service: Gastroenterology;;   ESOPHAGOGASTRODUODENOSCOPY (EGD) WITH PROPOFOL N/A 05/22/2023   Procedure: ESOPHAGOGASTRODUODENOSCOPY (EGD) WITH PROPOFOL;  Surgeon: Benancio Deeds, MD;  Location: [facility] ENDOSCOPY;  Service: Gastroenterology;  Laterality: N/A;   HEMORRHOID SURGERY     NASOPHARYNGEAL BIOPSY N/A 05/19/2023   Procedure: NASOPHARYNGEAL BIOPSY;  Surgeon: Newman Pies, MD;  Location: [facility] OR;  Service: ENT;  Laterality: N/A;      Allergies: Sulfonamide derivatives  Medications: Prior to Admission medications   Medication Sig Start Date End Date Taking? Authorizing Provider  acyclovir (ZOVIRAX) 400 MG tablet Take 1 tablet (400 mg total) by mouth daily. 06/02/23   Pasam, Archie Patten, MD  allopurinol (ZYLOPRIM) 300 MG tablet Take 1 tablet (300 mg total) by mouth daily. 06/02/23   Pasam, Archie Patten, MD  calcium carbonate (TUMS - DOSED IN MG ELEMENTAL CALCIUM) 500 MG chewable tablet Chew 1 tablet by mouth 3 (three) times daily with  meals.    [provider]  dapsone 100 MG tablet Take 1 tablet (100 mg total) by mouth daily. Patient taking differently: Take 100 mg by mouth daily with breakfast. 01/17/23   Comer, Belia Heman, MD  Darunavir-Cobicistat-Emtricitabine-Tenofovir Alafenamide [facility]) 800-150-200-10 MG TABS Take 1 tablet by mouth daily with breakfast. 01/17/23   Comer, Belia Heman, MD  fluconazole (DIFLUCAN) 200 MG tablet Take 1 tablet (200 mg total) by mouth daily. 05/25/23   Amin, Ankit C, MD  ibuprofen (ADVIL) 200 MG tablet Take 200-400 mg by mouth every 6 (six) hours as needed for mild pain (pain score 1-3).    [provider]  levofloxacin (LEVAQUIN) 750 MG tablet Take 1 tablet (750 mg total) by mouth daily for 7 days. 05/30/23 06/06/23  Hughie Closs, MD  lidocaine-prilocaine (EMLA) cream Apply to affected area once 06/02/23   Pasam, Avinash, MD  ondansetron (ZOFRAN) 8 MG tablet Take 1 tablet (8 mg total) by mouth every 8 (eight) hours as needed for nausea or vomiting. Start on the third day after cyclophosphamide chemotherapy. 06/02/23   Pasam, Archie Patten, MD  pantoprazole (PROTONIX) 40 MG tablet Take 1 tablet (40 mg total) by mouth 2 (two) times daily before a meal. 05/24/23   Amin, Ankit C, MD  predniSONE (DELTASONE) 20 MG tablet Take 5 tablets (100 mg total) by mouth daily. Take with food on days 1-5 of chemotherapy. 06/02/23   Pasam, Archie Patten, MD  prochlorperazine (COMPAZINE) 10 MG tablet Take 1 tablet (10 mg total) by mouth every 6 (six) hours as needed for nausea or vomiting. 06/02/23  Pasam, Avinash, MD  rosuvastatin (CRESTOR) 10 MG tablet Take 1 tablet (10 mg total) by mouth daily. Patient not taking: Reported on 05/17/2023 04/19/23   Gardiner Barefoot, MD  traMADol (ULTRAM) 50 MG tablet Take 1 tablet (50 mg total) by mouth every 8 (eight) hours as needed. 06/02/23   Pasam, Archie Patten, MD     Vital Signs:PENDING    Code Status: FULL CODE  Physical Exam: Awake, alert.  Chest clear to auscultation  bilaterally.  Heart with regular rate and rhythm.  Abdomen soft, positive bowel sounds, nontender.  No lower extremity edema.  Imaging: No results found.  Labs:  CBC: Recent Labs    05/23/23 0524 05/24/23 0537 05/29/23 0235 06/02/23 1540  WBC 8.3 7.6 5.8 10.7*  HGB 9.3* 10.2* 9.6* 11.9*  HCT 29.5* 31.5* 31.0* 35.9*  PLT 259 276 203 278    COAGS: Recent Labs    05/23/23 0524  INR 1.1    BMP: Recent Labs    05/23/23 0524 05/24/23 0537 05/29/23 0235 06/02/23 1540  NA 133* 133* 133* 135  K 4.0 4.7 4.0 5.2*  CL 101 101 105 103  CO2 24 25 22 25   GLUCOSE 141* 102* 97 86  BUN 25* 23 23 31*  CALCIUM 8.6* 8.7* 8.5* 10.3  CREATININE 1.32* 1.24 1.06 1.25*  GFRNONAA >60 >60 >60 >60    LIVER FUNCTION TESTS: Recent Labs    05/11/23 1135 05/18/23 0452 05/29/23 0235 06/02/23 1540  BILITOT 1.2* 0.5 0.3 0.6  AST 35 24 24 44*  ALT 33 26 32 44  ALKPHOS 111 87 90 117  PROT 8.2* 7.0 7.6 8.9*  ALBUMIN 4.1 3.1* 3.4* 4.6    Assessment and Plan: 62 year old male with past medical history significant for anxiety, depression, anemia, HIV who presents now with newly diagnosed high-grade B-cell lymphoma.  He is scheduled today for Port-A-Cath placement to assist with treatment.Risks and benefits of image guided port-a-catheter placement was discussed with the patient including, but not limited to bleeding, infection, pneumothorax, or fibrin sheath development and need for additional procedures.  All of the patient's questions were answered, patient is agreeable to proceed. Consent signed and in chart.    Electronically Signed: D. Jeananne Rama, PA-C 06/05/2023, 2:47 PM   I spent a total of 25 minutes at the the patient's bedside AND on the patient's hospital floor or unit, greater than 50% of which was counseling/coordinating care for port a cath placement

## 2023-06-06 ENCOUNTER — Other Ambulatory Visit: Payer: Self-pay

## 2023-06-06 ENCOUNTER — Ambulatory Visit (HOSPITAL_COMMUNITY)
Admission: RE | Admit: 2023-06-06 | Discharge: 2023-06-06 | Disposition: A | Discharge status: Home / Self Care | Payer: Medicare Other | Source: Ambulatory Visit | Attending: Oncology

## 2023-06-06 ENCOUNTER — Ambulatory Visit (HOSPITAL_COMMUNITY)
Admission: RE | Admit: 2023-06-06 | Discharge: 2023-06-06 | Disposition: A | Discharge status: Home / Self Care | Payer: Medicare Other | Source: Ambulatory Visit | Attending: Oncology | Admitting: Oncology

## 2023-06-06 ENCOUNTER — Encounter (HOSPITAL_COMMUNITY): Payer: Self-pay

## 2023-06-06 DIAGNOSIS — Z21 Asymptomatic human immunodeficiency virus [HIV] infection status: Secondary | ICD-10-CM | POA: Diagnosis not present

## 2023-06-06 DIAGNOSIS — F419 Anxiety disorder, unspecified: Secondary | ICD-10-CM | POA: Insufficient documentation

## 2023-06-06 DIAGNOSIS — C8338 Diffuse large B-cell lymphoma, lymph nodes of multiple sites: Secondary | ICD-10-CM | POA: Diagnosis present

## 2023-06-06 DIAGNOSIS — D649 Anemia, unspecified: Secondary | ICD-10-CM | POA: Diagnosis not present

## 2023-06-06 DIAGNOSIS — F32A Depression, unspecified: Secondary | ICD-10-CM | POA: Diagnosis not present

## 2023-06-06 HISTORY — PX: IR IMAGING GUIDED PORT INSERTION: IMG5740

## 2023-06-06 MED ORDER — MIDAZOLAM HCL 2 MG/2ML IJ SOLN
INTRAMUSCULAR | Status: AC | PRN
  Administered 2023-06-06: .5 mg via INTRAVENOUS

## 2023-06-06 MED ORDER — MIDAZOLAM HCL 2 MG/2ML IJ SOLN
INTRAMUSCULAR | Status: AC
  Filled 2023-06-06: qty 4

## 2023-06-06 MED ORDER — HEPARIN SOD (PORK) LOCK FLUSH 100 UNIT/ML IV SOLN
500.0000 [IU] | Freq: Once | INTRAVENOUS | Status: AC
  Administered 2023-06-06: 500 [IU] via INTRAVENOUS

## 2023-06-06 MED ORDER — FENTANYL CITRATE (PF) 100 MCG/2ML IJ SOLN
INTRAMUSCULAR | Status: AC | PRN
  Administered 2023-06-06: 50 ug via INTRAVENOUS

## 2023-06-06 MED ORDER — LIDOCAINE-EPINEPHRINE 1 %-1:100000 IJ SOLN
INTRAMUSCULAR | Status: AC
  Filled 2023-06-06: qty 1

## 2023-06-06 MED ORDER — FENTANYL CITRATE (PF) 100 MCG/2ML IJ SOLN
INTRAMUSCULAR | Status: AC
  Filled 2023-06-06: qty 2

## 2023-06-06 MED ORDER — SODIUM CHLORIDE 0.9 % IV SOLN: INTRAVENOUS | Status: DC

## 2023-06-06 MED ORDER — LIDOCAINE-EPINEPHRINE 1 %-1:100000 IJ SOLN
20.0000 mL | Freq: Once | INTRAMUSCULAR | Status: AC
  Administered 2023-06-06: 20 mL via INTRADERMAL

## 2023-06-06 MED ORDER — FENTANYL CITRATE (PF) 100 MCG/2ML IJ SOLN
INTRAMUSCULAR | Status: AC | PRN
  Administered 2023-06-06: 25 ug via INTRAVENOUS

## 2023-06-06 MED ORDER — MIDAZOLAM HCL 2 MG/2ML IJ SOLN
INTRAMUSCULAR | Status: AC | PRN
  Administered 2023-06-06: 1 mg via INTRAVENOUS

## 2023-06-06 MED ORDER — HEPARIN SOD (PORK) LOCK FLUSH 100 UNIT/ML IV SOLN
INTRAVENOUS | Status: AC
  Filled 2023-06-06: qty 5

## 2023-06-06 NOTE — Discharge Instructions (Signed)

## 2023-06-06 NOTE — Procedures (Signed)
 Interventional Radiology Procedure Note  Procedure: Placement of a right IJ approach single lumen PowerPort.  Tip is positioned at the superior cavoatrial junction and catheter is ready for immediate use.  Complications: None Recommendations:  - Ok to shower tomorrow - Do not submerge for 7 days - Routine line care  Signed,  Yvone Neu. Loreta Ave, DO, ABVM, RPVI

## 2023-06-07 ENCOUNTER — Other Ambulatory Visit: Payer: Medicare Other

## 2023-06-07 DIAGNOSIS — C8510 Unspecified B-cell lymphoma, unspecified site: Secondary | ICD-10-CM

## 2023-06-07 LAB — SURGICAL PATHOLOGY

## 2023-06-08 ENCOUNTER — Ambulatory Visit (HOSPITAL_COMMUNITY)
Admission: RE | Admit: 2023-06-08 | Discharge: 2023-06-08 | Disposition: A | Discharge status: Home / Self Care | Payer: Medicare Other | Source: Ambulatory Visit | Attending: Oncology | Admitting: Oncology

## 2023-06-08 DIAGNOSIS — C8510 Unspecified B-cell lymphoma, unspecified site: Secondary | ICD-10-CM | POA: Diagnosis present

## 2023-06-08 LAB — CSF CELL COUNT WITH DIFFERENTIAL
RBC Count, CSF: 0 /mm3
Tube #: 1
WBC, CSF: 0 /mm3 (ref 0–5)

## 2023-06-08 MED ORDER — LIDOCAINE 1 % OPTIME INJ - NO CHARGE
5.0000 mL | Freq: Once | INTRAMUSCULAR | Status: AC
  Administered 2023-06-08: 5 mL via INTRADERMAL
  Filled 2023-06-08: qty 6

## 2023-06-08 MED FILL — Fosaprepitant Dimeglumine For IV Infusion 150 MG (Base Eq): INTRAVENOUS | Qty: 5 | Status: AC

## 2023-06-08 NOTE — Progress Notes (Signed)
Successful LP performed at L2-L3 level. No complications.  Brayton El PA-C Interventional Radiology 06/08/2023 9:29 AM

## 2023-06-09 ENCOUNTER — Encounter: Payer: Self-pay | Admitting: Oncology

## 2023-06-09 ENCOUNTER — Inpatient Hospital Stay: Payer: Medicare Other

## 2023-06-09 ENCOUNTER — Other Ambulatory Visit: Payer: Self-pay | Admitting: Oncology

## 2023-06-09 ENCOUNTER — Inpatient Hospital Stay (HOSPITAL_BASED_OUTPATIENT_CLINIC_OR_DEPARTMENT_OTHER): Payer: Medicare Other | Admitting: Oncology

## 2023-06-09 VITALS — BP 128/83 | HR 69 | Temp 98.3°F | Resp 18

## 2023-06-09 DIAGNOSIS — Z5111 Encounter for antineoplastic chemotherapy: Secondary | ICD-10-CM | POA: Diagnosis not present

## 2023-06-09 DIAGNOSIS — B20 Human immunodeficiency virus [HIV] disease: Secondary | ICD-10-CM | POA: Diagnosis not present

## 2023-06-09 DIAGNOSIS — R768 Other specified abnormal immunological findings in serum: Secondary | ICD-10-CM | POA: Insufficient documentation

## 2023-06-09 DIAGNOSIS — C8510 Unspecified B-cell lymphoma, unspecified site: Secondary | ICD-10-CM

## 2023-06-09 DIAGNOSIS — D649 Anemia, unspecified: Secondary | ICD-10-CM | POA: Diagnosis not present

## 2023-06-09 DIAGNOSIS — C8338 Diffuse large B-cell lymphoma, lymph nodes of multiple sites: Secondary | ICD-10-CM

## 2023-06-09 LAB — CBC WITH DIFFERENTIAL (CANCER CENTER ONLY)
Abs Immature Granulocytes: 0.01 10*3/uL (ref 0.00–0.07)
Basophils Absolute: 0.1 10*3/uL (ref 0.0–0.1)
Basophils Relative: 1 %
Eosinophils Absolute: 0.4 10*3/uL (ref 0.0–0.5)
Eosinophils Relative: 5 %
HCT: 35 % — ABNORMAL LOW (ref 39.0–52.0)
Hemoglobin: 11.6 g/dL — ABNORMAL LOW (ref 13.0–17.0)
Immature Granulocytes: 0 %
Lymphocytes Relative: 21 %
Lymphs Abs: 1.5 10*3/uL (ref 0.7–4.0)
MCH: 31.7 pg (ref 26.0–34.0)
MCHC: 33.1 g/dL (ref 30.0–36.0)
MCV: 95.6 fL (ref 80.0–100.0)
Monocytes Absolute: 0.7 10*3/uL (ref 0.1–1.0)
Monocytes Relative: 11 %
Neutro Abs: 4.4 10*3/uL (ref 1.7–7.7)
Neutrophils Relative %: 62 %
Platelet Count: 226 10*3/uL (ref 150–400)
RBC: 3.66 MIL/uL — ABNORMAL LOW (ref 4.22–5.81)
RDW: 13.4 % (ref 11.5–15.5)
WBC Count: 7.1 10*3/uL (ref 4.0–10.5)
nRBC: 0 % (ref 0.0–0.2)

## 2023-06-09 LAB — CMP (CANCER CENTER ONLY)
ALT: 18 U/L (ref 0–44)
AST: 19 U/L (ref 15–41)
Albumin: 4.1 g/dL (ref 3.5–5.0)
Alkaline Phosphatase: 119 U/L (ref 38–126)
Anion gap: 9 (ref 5–15)
BUN: 23 mg/dL (ref 8–23)
CO2: 26 mmol/L (ref 22–32)
Calcium: 9.7 mg/dL (ref 8.9–10.3)
Chloride: 101 mmol/L (ref 98–111)
Creatinine: 1.26 mg/dL — ABNORMAL HIGH (ref 0.61–1.24)
GFR, Estimated: >60 mL/min (ref >60)
Glucose, Bld: 105 mg/dL — ABNORMAL HIGH (ref 70–99)
Potassium: 4.1 mmol/L (ref 3.5–5.1)
Sodium: 136 mmol/L (ref 135–145)
Total Bilirubin: 0.5 mg/dL (ref <1.2)
Total Protein: 7.9 g/dL (ref 6.5–8.1)

## 2023-06-09 LAB — CYTOLOGY - NON PAP

## 2023-06-09 LAB — LACTATE DEHYDROGENASE: LDH: 175 U/L (ref 98–192)

## 2023-06-09 LAB — HEPATITIS B SURFACE ANTIGEN: Hepatitis B Surface Ag: NONREACTIVE

## 2023-06-09 MED ORDER — SODIUM CHLORIDE 0.9 % IV SOLN
2.0000 mg | Freq: Once | INTRAVENOUS | Status: AC
  Administered 2023-06-09: 2 mg via INTRAVENOUS
  Filled 2023-06-09: qty 2

## 2023-06-09 MED ORDER — SODIUM CHLORIDE 0.9 % IV SOLN
150.0000 mg | Freq: Once | INTRAVENOUS | Status: AC
  Administered 2023-06-09: 150 mg via INTRAVENOUS
  Filled 2023-06-09: qty 150

## 2023-06-09 MED ORDER — PALONOSETRON HCL INJECTION 0.25 MG/5ML
0.2500 mg | Freq: Once | INTRAVENOUS | Status: AC
  Administered 2023-06-09: 0.25 mg via INTRAVENOUS
  Filled 2023-06-09: qty 5

## 2023-06-09 MED ORDER — ACETAMINOPHEN 325 MG PO TABS
650.0000 mg | ORAL_TABLET | Freq: Once | ORAL | Status: AC
  Administered 2023-06-09: 650 mg via ORAL
  Filled 2023-06-09: qty 2

## 2023-06-09 MED ORDER — CYCLOPHOSPHAMIDE CHEMO INJECTION 1 GM
750.0000 mg/m2 | Freq: Once | INTRAMUSCULAR | Status: AC
  Administered 2023-06-09: 1500 mg via INTRAVENOUS
  Filled 2023-06-09: qty 75

## 2023-06-09 MED ORDER — DOXORUBICIN HCL CHEMO IV INJECTION 2 MG/ML
50.0000 mg/m2 | Freq: Once | INTRAVENOUS | Status: AC
  Administered 2023-06-09: 94 mg via INTRAVENOUS
  Filled 2023-06-09: qty 47

## 2023-06-09 MED ORDER — SODIUM CHLORIDE 0.9 % IV SOLN: INTRAVENOUS | Status: DC

## 2023-06-09 MED ORDER — DEXAMETHASONE SODIUM PHOSPHATE 10 MG/ML IJ SOLN
10.0000 mg | Freq: Once | INTRAMUSCULAR | Status: AC
  Administered 2023-06-09: 10 mg via INTRAVENOUS
  Filled 2023-06-09: qty 1

## 2023-06-09 MED ORDER — SODIUM CHLORIDE 0.9 % IV SOLN
375.0000 mg/m2 | Freq: Once | INTRAVENOUS | Status: AC
Start: 2023-06-09 — End: 2023-06-09
  Administered 2023-06-09: 700 mg via INTRAVENOUS
  Filled 2023-06-09: qty 50

## 2023-06-09 MED ORDER — DIPHENHYDRAMINE HCL 25 MG PO CAPS
50.0000 mg | ORAL_CAPSULE | Freq: Once | ORAL | Status: AC
  Administered 2023-06-09: 50 mg via ORAL
  Filled 2023-06-09: qty 2

## 2023-06-09 MED ORDER — SODIUM CHLORIDE 0.9% FLUSH
10.0000 mL | INTRAVENOUS | Status: DC | PRN
  Administered 2023-06-09: 10 mL

## 2023-06-09 MED ORDER — HEPARIN SOD (PORK) LOCK FLUSH 100 UNIT/ML IV SOLN
500.0000 [IU] | Freq: Once | INTRAVENOUS | Status: AC | PRN
Start: 2023-06-09 — End: 2023-06-09
  Administered 2023-06-09: 500 [IU]

## 2023-06-09 NOTE — Assessment & Plan Note (Signed)
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  Could also be from his medications.  -Iron studies grossly unremarkable recently.  B12, folate are within normal limits.  Will monitor this during treatments.  Hemoglobin is overall stable at 11.6 today.

## 2023-06-09 NOTE — Assessment & Plan Note (Addendum)
-   Currently under reasonable control.  Has close follow-up with ID, Dr.Comer.  He is considering switching his HIV therapy to [facility].  Continue Symtuza for now

## 2023-06-09 NOTE — Patient Instructions (Signed)
[facility] - A DEPT OF [facility]  Discharge Instructions: Thank you for choosing [facility] to provide your oncology and hematology care.   If you have a lab appointment with the Cancer Center, please go directly to the Cancer Center and check in at the registration area.   Wear comfortable clothing and clothing appropriate for easy access to any Portacath or PICC line.   We strive to give you quality time with your provider. You may need to reschedule your appointment if you arrive late (15 or more minutes).  Arriving late affects you and other patients whose appointments are after yours.  Also, if you miss three or more appointments without notifying the office, you may be dismissed from the clinic at the provider's discretion.      For prescription refill requests, have your pharmacy contact our office and allow 72 hours for refills to be completed.    Today you received the following chemotherapy and/or immunotherapy agents: Doxorubicin, Vincristine, Cytoxan, Rituximab.       To help prevent nausea and vomiting after your treatment, we encourage you to take your nausea medication as directed.  BELOW ARE SYMPTOMS THAT SHOULD BE REPORTED IMMEDIATELY: *FEVER GREATER THAN 100.4 F (38 C) OR HIGHER *CHILLS OR SWEATING *NAUSEA AND VOMITING THAT IS NOT CONTROLLED WITH YOUR NAUSEA MEDICATION *UNUSUAL SHORTNESS OF BREATH *UNUSUAL BRUISING OR BLEEDING *URINARY PROBLEMS (pain or burning when urinating, or frequent urination) *BOWEL PROBLEMS (unusual diarrhea, constipation, pain near the anus) TENDERNESS IN MOUTH AND THROAT WITH OR WITHOUT PRESENCE OF ULCERS (sore throat, sores in mouth, or a toothache) UNUSUAL RASH, SWELLING OR PAIN  UNUSUAL VAGINAL DISCHARGE OR ITCHING   Items with * indicate a potential emergency and should be followed up as soon as possible or go to the Emergency Department if any problems should occur.  Please show the  CHEMOTHERAPY ALERT CARD or IMMUNOTHERAPY ALERT CARD at check-in to the Emergency Department and triage nurse.  Should you have questions after your visit or need to cancel or reschedule your appointment, please contact [facility] - A DEPT OF [facility]  Dept: [phone]  and follow the prompts.  Office hours are 8:00 a.m. to 4:30 p.m. Monday - Friday. Please note that voicemails left after 4:00 p.m. may not be returned until the following business day.  We are closed weekends and major holidays. You have access to a nurse at all times for urgent questions. Please call the main number to the clinic Dept: [phone] and follow the prompts.   For any non-urgent questions, you may also contact your provider using MyChart. We now offer e-Visits for anyone 89 and older to request care online for non-urgent symptoms. For details visit [email or web address].   Also download the MyChart app! Go to the app store, search "MyChart", open the app, select [place], and log in with your MyChart username and password.  Doxorubicin Injection What is this medication? DOXORUBICIN (dox oh ROO bi sin) treats some types of cancer. It works by slowing down the growth of cancer cells. This medicine may be used for other purposes; ask your health care provider or pharmacist if you have questions. COMMON BRAND NAME(S): Adriamycin, Adriamycin PFS, Adriamycin RDF, Rubex What should I tell my care team before I take this medication? They need to know if you have any of these conditions: Heart disease History of low blood cell levels caused by  a medication Liver disease Recent or ongoing radiation An unusual or allergic reaction to doxorubicin, other medications, foods, dyes, or preservatives If you or your partner are pregnant or trying to get pregnant Breast-feeding How should I use this medication? This medication is injected into a vein. It is given by your care team in a  hospital or clinic setting. Talk to your care team about the use of this medication in children. Special care may be needed. Overdosage: If you think you have taken too much of this medicine contact a poison control center or emergency room at once. NOTE: This medicine is only for you. Do not share this medicine with others. What if I miss a dose? Keep appointments for follow-up doses. It is important not to miss your dose. Call your care team if you are unable to keep an appointment. What may interact with this medication? 6-mercaptopurine Paclitaxel Phenytoin St. John's wort Trastuzumab Verapamil This list may not describe all possible interactions. Give your health care provider a list of all the medicines, herbs, non-prescription drugs, or dietary supplements you use. Also tell them if you smoke, drink alcohol, or use illegal drugs. Some items may interact with your medicine. What should I watch for while using this medication? Your condition will be monitored carefully while you are receiving this medication. You may need blood work while taking this medication. This medication may make you feel generally unwell. This is not uncommon as chemotherapy can affect healthy cells as well as cancer cells. Report any side effects. Continue your course of treatment even though you feel ill unless your care team tells you to stop. There is a maximum amount of this medication you should receive throughout your life. The amount depends on the medical condition being treated and your overall health. Your care team will watch how much of this medication you receive. Tell your care team if you have taken this medication before. Your urine may turn red for a few days after your dose. This is not blood. If your urine is dark or brown, call your care team. In some cases, you may be given additional medications to help with side effects. Follow all directions for their use. This medication may increase your  risk of getting an infection. Call your care team for advice if you get a fever, chills, sore throat, or other symptoms of a cold or flu. Do not treat yourself. Try to avoid being around people who are sick. This medication may increase your risk to bruise or bleed. Call your care team if you notice any unusual bleeding. Talk to your care team about your risk of cancer. You may be more at risk for certain types of cancers if you take this medication. Talk to your care team if you or your partner may be pregnant. Serious birth defects can occur if you take this medication during pregnancy and for 6 months after the last dose. Contraception is recommended while taking this medication and for 6 months after the last dose. Your care team can help you find the option that works for you. If your partner can get pregnant, use a condom while taking this medication and for 6 months after the last dose. Do not breastfeed while taking this medication. This medication may cause infertility. Talk to your care team if you are concerned about your fertility. What side effects may I notice from receiving this medication? Side effects that you should report to your care team as soon as possible:  Allergic reactions--skin rash, itching, hives, swelling of the face, lips, tongue, or throat Heart failure--shortness of breath, swelling of the ankles, feet, or hands, sudden weight gain, unusual weakness or fatigue Heart rhythm changes--fast or irregular heartbeat, dizziness, feeling faint or lightheaded, chest pain, trouble breathing Infection--fever, chills, cough, sore throat, wounds that don't heal, pain or trouble when passing urine, general feeling of discomfort or being unwell Low red blood cell level--unusual weakness or fatigue, dizziness, headache, trouble breathing Painful swelling, warmth, or redness of the skin, blisters or sores at the infusion site Unusual bruising or bleeding Side effects that usually do not  require medical attention (report to your care team if they continue or are bothersome): Diarrhea Hair loss Nausea Pain, redness, or swelling with sores inside the mouth or throat Red urine This list may not describe all possible side effects. Call your doctor for medical advice about side effects. You may report side effects to FDA at 1-800-FDA-1088. Where should I keep my medication? This medication is given in a hospital or clinic. It will not be stored at home. NOTE: This sheet is a summary. It may not cover all possible information. If you have questions about this medicine, talk to your doctor, pharmacist, or health care provider.  2024 Elsevier/Gold Standard (2022-09-08 00:00:00)  Vincristine Injection What is this medication? VINCRISTINE (vin KRIS teen) treats some types of cancer. It works by slowing down the growth of cancer cells. This medicine may be used for other purposes; ask your health care provider or pharmacist if you have questions. COMMON BRAND NAME(S): Oncovin, Vincasar PFS What should I tell my care team before I take this medication? They need to know if you have any of these conditions: Infection Kidney disease Liver disease Low white blood cell levels Lung disease Nervous system disease, such as Charcot-Marie-Tooth (CMT) Recent or ongoing radiation therapy An unusual or allergic reaction to vincristine, other chemotherapy agents, other medications, foods, dyes, or preservatives Pregnant or trying to get pregnant Breast-feeding How should I use this medication? This medication is infused into a vein. It is given by your care team in a hospital or clinic setting. Talk to your care team about the use of this medication in children. While it may be given to children for selected conditions, precautions do apply. Overdosage: If you think you have taken too much of this medicine contact a poison control center or emergency room at once. NOTE: This medicine is  only for you. Do not share this medicine with others. What if I miss a dose? Keep appointments for follow-up doses. It is important not to miss your dose. Call your care team if you are unable to keep an appointment. What may interact with this medication? Do not take this medication with any of the following: Live virus vaccines This medication may also interact with the following: Medications for fungal infections, such as itraconazole or fluconazole Phenytoin Supplements, such as St. John's wort This list may not describe all possible interactions. Give your health care provider a list of all the medicines, herbs, non-prescription drugs, or dietary supplements you use. Also tell them if you smoke, drink alcohol, or use illegal drugs. Some items may interact with your medicine. What should I watch for while using this medication? Your condition will be monitored carefully while you are receiving this medication. This medication may make you feel generally unwell. This is not uncommon as chemotherapy can affect healthy cells as well as cancer cells. Report any side effects. Continue  your course of treatment even though you feel ill unless your care team tells you to stop. You may need blood work while taking this medication. This medication may increase your risk to bruise or bleed. Call your care team if you notice any unusual bleeding. This medication may increase your risk of getting an infection. Call your care team for advice if you get a fever, chills, sore throat, or other symptoms of a cold or flu. Do not treat yourself. Try to avoid being around people who are sick. This medication will cause constipation. If you do not have a bowel movement for 3 days, call your care team. Call your care team if you are around anyone with measles, chickenpox, or if you develop sores or blisters that do not heal properly. Be careful brushing or flossing your teeth or using a toothpick because you may get  an infection or bleed more easily. If you have any dental work done, tell your dentist you are receiving this medication Talk to your care team if you or your partner wish to become pregnant or think either of you might be pregnant. This medication can cause serious birth defects. This medication may cause infertility. Talk to your care team if you are concerned about your fertility. Talk to your care team before breastfeeding. Changes to your treatment plan may be needed. What side effects may I notice from receiving this medication? Side effects that you should report to your care team as soon as possible: Allergic reactions--skin rash, itching, hives, swelling of the face, lips, tongue, or throat High uric acid level--severe pain, redness, warmth, or swelling in joints, pain or trouble passing urine, pain in the lower back or sides Infection--fever, chills, cough, sore throat, wounds that don't heal, pain or trouble when passing urine, general feeling of discomfort or being unwell Pain, tingling, or numbness in the hands or feet, muscle weakness, change in vision, confusion or trouble speaking, loss of balance or coordination, trouble walking, seizures Painful swelling, warmth, or redness of the skin, blisters or sores at the infusion site Shortness of breath or trouble breathing Side effects that usually do not require medical attention (report to your care team if they continue or are bothersome): Constipation Diarrhea Hair loss Loss of appetite Nausea Stomach cramping Vomiting This list may not describe all possible side effects. Call your doctor for medical advice about side effects. You may report side effects to FDA at 1-800-FDA-1088. Where should I keep my medication? This medication is given in a hospital or clinic. It will not be stored at home. NOTE: This sheet is a summary. It may not cover all possible information. If you have questions about this medicine, talk to your doctor,  pharmacist, or health care provider.  2024 Elsevier/Gold Standard (2021-08-31 00:00:00)  Cyclophosphamide Injection What is this medication? CYCLOPHOSPHAMIDE (sye kloe FOSS fa mide) treats some types of cancer. It works by slowing down the growth of cancer cells. This medicine may be used for other purposes; ask your health care provider or pharmacist if you have questions. COMMON BRAND NAME(S): Cyclophosphamide, Cytoxan, Neosar What should I tell my care team before I take this medication? They need to know if you have any of these conditions: Heart disease Irregular heartbeat or rhythm Infection Kidney problems Liver disease Low blood cell levels (white cells, platelets, or red blood cells) Lung disease Previous radiation Trouble passing urine An unusual or allergic reaction to cyclophosphamide, other medications, foods, dyes, or preservatives Pregnant or trying to  get pregnant Breast-feeding How should I use this medication? This medication is injected into a vein. It is given by your care team in a hospital or clinic setting. Talk to your care team about the use of this medication in children. Special care may be needed. Overdosage: If you think you have taken too much of this medicine contact a poison control center or emergency room at once. NOTE: This medicine is only for you. Do not share this medicine with others. What if I miss a dose? Keep appointments for follow-up doses. It is important not to miss your dose. Call your care team if you are unable to keep an appointment. What may interact with this medication? Amphotericin B Amiodarone Azathioprine Certain antivirals for HIV or hepatitis Certain medications for blood pressure, such as enalapril, lisinopril, quinapril Cyclosporine Diuretics Etanercept Indomethacin Medications that relax muscles Metronidazole Natalizumab Tamoxifen Warfarin This list may not describe all possible interactions. Give your health  care provider a list of all the medicines, herbs, non-prescription drugs, or dietary supplements you use. Also tell them if you smoke, drink alcohol, or use illegal drugs. Some items may interact with your medicine. What should I watch for while using this medication? This medication may make you feel generally unwell. This is not uncommon as chemotherapy can affect healthy cells as well as cancer cells. Report any side effects. Continue your course of treatment even though you feel ill unless your care team tells you to stop. You may need blood work while you are taking this medication. This medication may increase your risk of getting an infection. Call your care team for advice if you get a fever, chills, sore throat, or other symptoms of a cold or flu. Do not treat yourself. Try to avoid being around people who are sick. Avoid taking medications that contain aspirin, acetaminophen, ibuprofen, naproxen, or ketoprofen unless instructed by your care team. These medications may hide a fever. Be careful brushing or flossing your teeth or using a toothpick because you may get an infection or bleed more easily. If you have any dental work done, tell your dentist you are receiving this medication. Drink water or other fluids as directed. Urinate often, even at night. Some products may contain alcohol. Ask your care team if this medication contains alcohol. Be sure to tell all care teams you are taking this medicine. Certain medicines, like metronidazole and disulfiram, can cause an unpleasant reaction when taken with alcohol. The reaction includes flushing, headache, nausea, vomiting, sweating, and increased thirst. The reaction can last from 30 minutes to several hours. Talk to your care team if you wish to become pregnant or think you might be pregnant. This medication can cause serious birth defects if taken during pregnancy and for 1 year after the last dose. A negative pregnancy test is required before  starting this medication. A reliable form of contraception is recommended while taking this medication and for 1 year after the last dose. Talk to your care team about reliable forms of contraception. Do not father a child while taking this medication and for 4 months after the last dose. Use a condom during this time period. Do not breast-feed while taking this medication or for 1 week after the last dose. This medication may cause infertility. Talk to your care team if you are concerned about your fertility. Talk to your care team about your risk of cancer. You may be more at risk for certain types of cancer if you take this medication.  What side effects may I notice from receiving this medication? Side effects that you should report to your care team as soon as possible: Allergic reactions--skin rash, itching, hives, swelling of the face, lips, tongue, or throat Dry cough, shortness of breath or trouble breathing Heart failure--shortness of breath, swelling of the ankles, feet, or hands, sudden weight gain, unusual weakness or fatigue Heart muscle inflammation--unusual weakness or fatigue, shortness of breath, chest pain, fast or irregular heartbeat, dizziness, swelling of the ankles, feet, or hands Heart rhythm changes--fast or irregular heartbeat, dizziness, feeling faint or lightheaded, chest pain, trouble breathing Infection--fever, chills, cough, sore throat, wounds that don't heal, pain or trouble when passing urine, general feeling of discomfort or being unwell Kidney injury--decrease in the amount of urine, swelling of the ankles, hands, or feet Liver injury--right upper belly pain, loss of appetite, nausea, light-colored stool, dark yellow or brown urine, yellowing skin or eyes, unusual weakness or fatigue Low red blood cell level--unusual weakness or fatigue, dizziness, headache, trouble breathing Low sodium level--muscle weakness, fatigue, dizziness, headache, confusion Red or dark  brown urine Unusual bruising or bleeding Side effects that usually do not require medical attention (report to your care team if they continue or are bothersome): Hair loss Irregular menstrual cycles or spotting Loss of appetite Nausea Pain, redness, or swelling with sores inside the mouth or throat Vomiting This list may not describe all possible side effects. Call your doctor for medical advice about side effects. You may report side effects to FDA at 1-800-FDA-1088. Where should I keep my medication? This medication is given in a hospital or clinic. It will not be stored at home. NOTE: This sheet is a summary. It may not cover all possible information. If you have questions about this medicine, talk to your doctor, pharmacist, or health care provider.  2024 Elsevier/Gold Standard (2021-10-22 00:00:00)  Rituximab Injection What is this medication? RITUXIMAB (ri TUX i mab) treats leukemia and lymphoma. It works by blocking a protein that causes cancer cells to grow and multiply. This helps to slow or stop the spread of cancer cells. It may also be used to treat autoimmune conditions, such as arthritis. It works by slowing down an overactive immune system. It is a monoclonal antibody. This medicine may be used for other purposes; ask your health care provider or pharmacist if you have questions. COMMON BRAND NAME(S): RIABNI, Rituxan, RUXIENCE, truxima What should I tell my care team before I take this medication? They need to know if you have any of these conditions: Chest pain Heart disease Immune system problems Infection, such as chickenpox, cold sores, hepatitis B, herpes Irregular heartbeat or rhythm Kidney disease Low blood counts, such as low white cells, platelets, red cells Lung disease Recent or upcoming vaccine An unusual or allergic reaction to rituximab, other medications, foods, dyes, or preservatives Pregnant or trying to get pregnant Breast-feeding How should I use  this medication? This medication is injected into a vein. It is given by a care team in a hospital or clinic setting. A special MedGuide will be given to you before each treatment. Be sure to read this information carefully each time. Talk to your care team about the use of this medication in children. While this medication may be prescribed for children as young as 6 months for selected conditions, precautions do apply. Overdosage: If you think you have taken too much of this medicine contact a poison control center or emergency room at once. NOTE: This medicine is only for  you. Do not share this medicine with others. What if I miss a dose? Keep appointments for follow-up doses. It is important not to miss your dose. Call your care team if you are unable to keep an appointment. What may interact with this medication? Do not take this medication with any of the following: Live vaccines This medication may also interact with the following: Cisplatin This list may not describe all possible interactions. Give your health care provider a list of all the medicines, herbs, non-prescription drugs, or dietary supplements you use. Also tell them if you smoke, drink alcohol, or use illegal drugs. Some items may interact with your medicine. What should I watch for while using this medication? Your condition will be monitored carefully while you are receiving this medication. You may need blood work while taking this medication. This medication can cause serious infusion reactions. To reduce the risk your care team may give you other medications to take before receiving this one. Be sure to follow the directions from your care team. This medication may increase your risk of getting an infection. Call your care team for advice if you get a fever, chills, sore throat, or other symptoms of a cold or flu. Do not treat yourself. Try to avoid being around people who are sick. Call your care team if you are around  anyone with measles, chickenpox, or if you develop sores or blisters that do not heal properly. Avoid taking medications that contain aspirin, acetaminophen, ibuprofen, naproxen, or ketoprofen unless instructed by your care team. These medications may hide a fever. This medication may cause serious skin reactions. They can happen weeks to months after starting the medication. Contact your care team right away if you notice fevers or flu-like symptoms with a rash. The rash may be red or purple and then turn into blisters or peeling of the skin. You may also notice a red rash with swelling of the face, lips, or lymph nodes in your neck or under your arms. In some patients, this medication may cause a serious brain infection that may cause death. If you have any problems seeing, thinking, speaking, walking, or standing, tell your care team right away. If you cannot reach your care team, urgently seek another source of medical care. Talk to your care team if you may be pregnant. Serious birth defects can occur if you take this medication during pregnancy and for 12 months after the last dose. You will need a negative pregnancy test before starting this medication. Contraception is recommended while taking this medication and for 12 months after the last dose. Your care team can help you find the option that works for you. Do not breastfeed while taking this medication and for at least 6 months after the last dose. What side effects may I notice from receiving this medication? Side effects that you should report to your care team as soon as possible: Allergic reactions or angioedema--skin rash, itching or hives, swelling of the face, eyes, lips, tongue, arms, or legs, trouble swallowing or breathing Bowel blockage--stomach cramping, unable to have a bowel movement or pass gas, loss of appetite, vomiting Dizziness, loss of balance or coordination, confusion or trouble speaking Heart attack--pain or tightness in  the chest, shoulders, arms, or jaw, nausea, shortness of breath, cold or clammy skin, feeling faint or lightheaded Heart rhythm changes--fast or irregular heartbeat, dizziness, feeling faint or lightheaded, chest pain, trouble breathing Infection--fever, chills, cough, sore throat, wounds that don't heal, pain or trouble when passing  urine, general feeling of discomfort or being unwell Infusion reactions--chest pain, shortness of breath or trouble breathing, feeling faint or lightheaded Kidney injury--decrease in the amount of urine, swelling of the ankles, hands, or feet Liver injury--right upper belly pain, loss of appetite, nausea, light-colored stool, dark yellow or brown urine, yellowing skin or eyes, unusual weakness or fatigue Redness, blistering, peeling, or loosening of the skin, including inside the mouth Stomach pain that is severe, does not go away, or gets worse Tumor lysis syndrome (TLS)--nausea, vomiting, diarrhea, decrease in the amount of urine, dark urine, unusual weakness or fatigue, confusion, muscle pain or cramps, fast or irregular heartbeat, joint pain Side effects that usually do not require medical attention (report to your care team if they continue or are bothersome): Headache Joint pain Nausea Runny or stuffy nose Unusual weakness or fatigue This list may not describe all possible side effects. Call your doctor for medical advice about side effects. You may report side effects to FDA at 1-800-FDA-1088. Where should I keep my medication? This medication is given in a hospital or clinic. It will not be stored at home. NOTE: This sheet is a summary. It may not cover all possible information. If you have questions about this medicine, talk to your doctor, pharmacist, or health care provider.  2024 [employer] (2021-10-28 00:00:00)

## 2023-06-09 NOTE — Progress Notes (Signed)
[facility]  ONCOLOGY CLINIC PROGRESS NOTE   Patient Care Team: Pcp, No as PCP - General Roderic Scarce, LCSW as Counselor Comer, Belia Heman, MD as Consulting Physician (Infectious Diseases)  PATIENT NAME: Jordan Calderon   MR#: [id number] DOB: Jul 28, 1960  Date of visit: 06/09/2023   ASSESSMENT & PLAN:   Jordan Calderon is a 62 y.o. gentleman with past medical history of HIV disease, depression, hypothyroidism, recent syncopal episode in November 2024 for which he was hospitalized from 05/11/2023 until 05/12/2023 with grossly negative TTE and CT head workup, was admitted to the hospital again on 05/17/2023 after he presented with dizziness, headaches.  CT angiogram of the head and neck on 05/17/2023 showed large right nasopharyngeal skull base mass which was suboptimally evaluated but was worrisome for nasopharyngeal carcinoma.  He was admitted for further evaluation and management.  Eventual workup revealed large B-cell lymphoma, high-grade, with multiple liver lesions and biopsies from stomach positive for non-Hodgkin lymphoma.  Stage IV disease.  FISH testing positive for BCL6 and negative for Bcl-2 and C-MYC.  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 Plan to monitor response of mass to lymphoma treatment.  -Discussed diagnosis, prognosis, staging, plan of care, treatment options.  Reviewed NCCN guidelines and discussed management options.  - FISH testing positive for BCL6 and negative for Bcl-2 and C-MYC.  -Staging PET/CT obtained on 06/05/2023.  Results pending.  -As patient was complaining of recent onset headaches and tremors, we obtained CSF analysis to make sure there is no lymphoma involvement.  Underwent LP on 06/08/2023.  Cytology from CSF fluid pending.  Imaging studies showed no gross abnormalities intracranially.  -Hepatitis B core antibody positive and  surface antigen negative, likely from past infection.  Discussed his case with his infectious disease doctor Dr. Luciana Axe.  Since patient is on Symtuza, he does not need additional treatment for this.  Submitted request for HBV DNA PCR today.  -Labs reveal no dose-limiting toxicities.  Proceeded with cycle 1 of R-CHOP today.  He will return to clinic in 2 days for Neulasta injection.  -We will monitor his CD4 count periodically.  We will have to hold chemotherapy for CD4 count of less than 50.  -RTC in 1 week for toxicity evaluation.  Human immunodeficiency virus (HIV) disease (HCC) - Currently under reasonable control.  Has close follow-up with ID, Dr.Comer.  He is considering switching his HIV therapy to [facility].  Continue Symtuza for now  Hepatitis B core antibody positive - He does have a past history of hepatitis B infection  -Hepatitis B surface antigen is negative and core total antibody was positive.  It could be from his past infection.  We will check HBV DNA.  -Since patient is already on Symtuza, there is low risk of reactivation and hence we will proceed with rituximab along with rest of the chemotherapy regimen  Normocytic anemia Likely from newly diagnosed large B-cell lymphoma.  Could also be from his medications.  -Iron studies grossly unremarkable recently.  B12, folate are within normal limits.  Will monitor this during treatments.  Hemoglobin is overall stable at 11.6 today.    I reviewed lab results and outside records for this visit and discussed relevant results with the patient. Diagnosis, plan of care and treatment options were also discussed in detail with the patient. Opportunity provided to ask questions and answers provided to his apparent satisfaction. Provided instructions  to call our clinic with any problems, questions or concerns prior to return visit. I recommended to continue follow-up with PCP and sub-specialists. He verbalized understanding and agreed with  the plan.   NCCN guidelines have been consulted in the planning of this patient's care.  I spent a total of 55 minutes during this encounter with the patient including review of chart and various tests results, discussions about plan of care and coordination of care plan.   Meryl Crutch, MD  06/09/2023 5:03 PM  [facility] - A DEPT OF [facility] [address] [place] [address] Dept: [phone] Dept Fax: [phone]    CHIEF COMPLAINT/ REASON FOR VISIT:   Recently diagnosed high-grade B-cell lymphoma, large cell type, stage IV disease with multiple liver lesions. FISH testing positive for BCL6 and negative for Bcl-2 and C-MYC.  Current Treatment: R-CHOP started from 06/09/2023.  INTERVAL HISTORY:    Discussed the use of AI scribe software for clinical note transcription with the patient, who gave verbal consent to proceed.   He was accompanied by his sister today, who is very supportive.  His sister lives in [place], [place].    He was recently diagnosed with large B cell non-Hodgkin's lymphoma involving the liver and stomach, presents with severe headaches and facial pain. The pain is localized to the right side of the face and neck, and is described as constant, with episodes of increased intensity. The patient also reports dizziness and lightheadedness. The headaches and facial pain have been ongoing since November and have not responded to Tylenol.  On last visit, we prescribed tramadol and this seems to help.  The patient also reports night sweats, which have been severe enough to require changing clothes during the night. The patient's appetite has decreased, but he is still able to eat and has been maintaining his weight. The patient also reports a tremor that has recently developed.  I have reviewed the past medical history, past surgical history, social history and family history with the patient and  they are unchanged from previous note.  HISTORY OF PRESENT ILLNESS:   62 y.o. gentleman with past medical history of HIV disease, depression, hypothyroidism, recent syncopal episode in November 2024 for which he was hospitalized from 05/11/2023 until 05/12/2023 with grossly negative TTE and CT head workup, was admitted to the hospital again on 05/17/2023 after he presented with dizziness, headaches.  CT angiogram of the head and neck on 05/17/2023 showed large right nasopharyngeal skull base mass which was suboptimally evaluated but was worrisome for nasopharyngeal carcinoma.  He was admitted for further evaluation and management.  Eventual workup revealed large B-cell lymphoma, high-grade, with multiple liver lesions and biopsies from stomach positive for non-Hodgkin lymphoma.  Stage IV disease.   ENT evaluated him and performed biopsy of right nasopharyngeal mass on 05/19/2023.  We were consulted for any additional recommendations.   MR angiogram of the head without IV contrast on 05/11/2023 showed 3 mm outpouching extending posteriorly and laterally from the left ICA terminus, potentially reflecting a vascular infundibulum versus aneurysm.  Otherwise normal intracranial MRA.   CT soft tissue of the neck on 05/18/2023 showed large right nasopharyngeal mass that extended inferiorly to the level of the greater horn of the hyoid bone on the right.  This mass involves right-sided adenoid and palatine tonsils, right parapharyngeal space, right masticator space, right parotid space.  Superiorly this most extended to the level of foreman  ovale bilaterally.  A 9 mm hypodense lesion in the left parotid gland was noted, unclear if necrotic lymph node versus intraparotid cyst.  On 05/19/2023, ENT performed excision of nasopharyngeal mass.  Pathology showed respiratory mucosa with chronic sinusitis and prominent secondary follicle/germinal center formation consistent with the presence of tonsillar type tissue.   Negative for malignancy.  On 05/20/2023, CT scan of the chest showed multiple liver lesions, concerning for metastatic disease, at least 15 lesions.  It also showed upper abdominal abnormal lymph nodes.  On 05/23/2023, CT abdomen and pelvis showed similar findings in the liver and gastrohepatic lymph nodes.  Focal wall thickening of the mid gastric lumen with also greater at the lesser curvature of stomach.  Left lung base lung nodule measuring 6 mm.  On 05/22/2023, GI performed upper endoscopy for further evaluation of anemia.  Esophageal mucosal changes classified as Barrett's stage C2-M3.  LA grade D esophagitis.  Esophageal plaques were found, consistent with candidiasis.  Nonbleeding gastric ulcer which was biopsied.  Pathology from stomach ulcer biopsy came back positive for non-Hodgkin's lymphoma, large cell type.  Esophageal biopsy showed Barrett's esophagus.  On 05/23/2023, he underwent core needle biopsy of liver lesions.  Pathology showed high-grade B-cell lymphoma, large cell type.  Ki-67 was more than 50%. FISH testing positive for BCL6 and negative for Bcl-2 and C-MYC.  Staging PET/CT performed on 06/05/2023.  Results pending.  Started chemotherapy with R-CHOP from 06/09/2023.  Oncology History  High grade B-cell lymphoma (HCC)  05/31/2023 Initial Diagnosis   DLBCL (diffuse large B cell lymphoma) (HCC)   06/05/2023 Cancer Staging   Staging form: Hodgkin and Non-Hodgkin Lymphoma, AJCC 8th Edition - Clinical: Stage IV (Diffuse large B-cell lymphoma) - Signed by Meryl Crutch, MD on 06/05/2023 Stage prefix: Initial diagnosis   06/09/2023 -  Chemotherapy   Patient is on Treatment Plan : NON-HODGKINS LYMPHOMA R-CHOP q21d         REVIEW OF SYSTEMS:   Review of Systems - Oncology  All other pertinent systems were reviewed with the patient and are negative.  ALLERGIES: He is allergic to sulfonamide derivatives.  MEDICATIONS:  Current Outpatient Medications  Medication Sig  Dispense Refill   acyclovir (ZOVIRAX) 400 MG tablet Take 1 tablet (400 mg total) by mouth daily. 30 tablet 3   allopurinol (ZYLOPRIM) 300 MG tablet Take 1 tablet (300 mg total) by mouth daily. 30 tablet 3   calcium carbonate (TUMS - DOSED IN MG ELEMENTAL CALCIUM) 500 MG chewable tablet Chew 1 tablet by mouth 3 (three) times daily with meals.     dapsone 100 MG tablet Take 1 tablet (100 mg total) by mouth daily. (Patient taking differently: Take 100 mg by mouth daily with breakfast.) 30 tablet 5   Darunavir-Cobicistat-Emtricitabine-Tenofovir Alafenamide (SYMTUZA) 800-150-200-10 MG TABS Take 1 tablet by mouth daily with breakfast. 30 tablet 11   fluconazole (DIFLUCAN) 200 MG tablet Take 1 tablet (200 mg total) by mouth daily. 30 tablet 0   ibuprofen (ADVIL) 200 MG tablet Take 200-400 mg by mouth every 6 (six) hours as needed for mild pain (pain score 1-3).     lidocaine-prilocaine (EMLA) cream Apply to affected area once 30 g 3   ondansetron (ZOFRAN) 8 MG tablet Take 1 tablet (8 mg total) by mouth every 8 (eight) hours as needed for nausea or vomiting. Start on the third day after cyclophosphamide chemotherapy. 30 tablet 1   pantoprazole (PROTONIX) 40 MG tablet Take 1 tablet (40 mg total) by mouth 2 (two) times  daily before a meal. 60 tablet 0   predniSONE (DELTASONE) 20 MG tablet Take 5 tablets (100 mg total) by mouth daily. Take with food on days 1-5 of chemotherapy. 25 tablet 5   prochlorperazine (COMPAZINE) 10 MG tablet Take 1 tablet (10 mg total) by mouth every 6 (six) hours as needed for nausea or vomiting. 30 tablet 6   rosuvastatin (CRESTOR) 10 MG tablet Take 1 tablet (10 mg total) by mouth daily. (Patient not taking: Reported on 05/17/2023) 30 tablet 11   traMADol (ULTRAM) 50 MG tablet Take 1 tablet (50 mg total) by mouth every 8 (eight) hours as needed. 60 tablet 0   No current facility-administered medications for this visit.   Facility-Administered Medications Ordered in Other Visits   Medication Dose Route Frequency Provider Last Rate Last Admin   0.9 %  sodium chloride infusion   Intravenous Continuous Tierra Thoma, MD   Stopped at 06/09/23 1545   sodium chloride flush (NS) 0.9 % injection 10 mL  10 mL Intracatheter PRN Iara Monds, MD   10 mL at 06/09/23 1542     VITALS:   There were no vitals taken for this visit.  Wt Readings from Last 3 Encounters:  06/06/23 162 lb (73.5 kg)  06/02/23 162 lb 8 oz (73.7 kg)  05/29/23 160 lb (72.6 kg)    There is no height or weight on file to calculate BMI.  Performance status (ECOG): 1 - Symptomatic but completely ambulatory  PHYSICAL EXAM:   Physical Exam Constitutional:      General: He is not in acute distress.    Appearance: Normal appearance.  HENT:     Head: Normocephalic and atraumatic.  Eyes:     General: No scleral icterus.    Conjunctiva/sclera: Conjunctivae normal.  Cardiovascular:     Rate and Rhythm: Normal rate and regular rhythm.     Heart sounds: Normal heart sounds.  Pulmonary:     Effort: Pulmonary effort is normal.     Breath sounds: Normal breath sounds.  Abdominal:     General: There is no distension.  Musculoskeletal:     Right lower leg: No edema.     Left lower leg: No edema.  Lymphadenopathy:     Cervical: Cervical adenopathy present.  Neurological:     General: No focal deficit present.     Mental Status: He is alert and oriented to person, place, and time.  Psychiatric:     Comments: Intermittently tearful during the interview      LABORATORY DATA:   I have reviewed the data as listed.  Results for orders placed or performed in visit on 06/09/23  Hepatitis B surface antigen  Result Value Ref Range   Hepatitis B Surface Ag NON REACTIVE NON REACTIVE  CMP (Cancer Center only)  Result Value Ref Range   Sodium 136 135 - 145 mmol/L   Potassium 4.1 3.5 - 5.1 mmol/L   Chloride 101 98 - 111 mmol/L   CO2 26 22 - 32 mmol/L   Glucose, Bld 105 (H) 70 - 99 mg/dL   BUN 23 8 -  23 mg/dL   Creatinine 1.61 (H) 0.96 - 1.24 mg/dL   Calcium 9.7 8.9 - 04.5 mg/dL   Total Protein 7.9 6.5 - 8.1 g/dL   Albumin 4.1 3.5 - 5.0 g/dL   AST 19 15 - 41 U/L   ALT 18 0 - 44 U/L   Alkaline Phosphatase 119 38 - 126 U/L   Total Bilirubin 0.5 <1.2  mg/dL   GFR, Estimated >30 >16 mL/min   Anion gap 9 5 - 15  CBC with Differential (Cancer Center Only)  Result Value Ref Range   WBC Count 7.1 4.0 - 10.5 K/uL   RBC 3.66 (L) 4.22 - 5.81 MIL/uL   Hemoglobin 11.6 (L) 13.0 - 17.0 g/dL   HCT 01.0 (L) 93.2 - 35.5 %   MCV 95.6 80.0 - 100.0 fL   MCH 31.7 26.0 - 34.0 pg   MCHC 33.1 30.0 - 36.0 g/dL   RDW 73.2 20.2 - 54.2 %   Platelet Count 226 150 - 400 K/uL   nRBC 0.0 0.0 - 0.2 %   Neutrophils Relative % 62 %   Neutro Abs 4.4 1.7 - 7.7 K/uL   Lymphocytes Relative 21 %   Lymphs Abs 1.5 0.7 - 4.0 K/uL   Monocytes Relative 11 %   Monocytes Absolute 0.7 0.1 - 1.0 K/uL   Eosinophils Relative 5 %   Eosinophils Absolute 0.4 0.0 - 0.5 K/uL   Basophils Relative 1 %   Basophils Absolute 0.1 0.0 - 0.1 K/uL   Immature Granulocytes 0 %   Abs Immature Granulocytes 0.01 0.00 - 0.07 K/uL  Lactate dehydrogenase  Result Value Ref Range   LDH 175 98 - 192 U/L    RADIOGRAPHIC STUDIES:  I have personally reviewed the radiological images as listed and agree with the findings in the report.  DG FL GUIDED LUMBAR PUNCTURE Result Date: 06/08/2023 CLINICAL DATA:  Newly diagnosed high-grade B-cell lymphoma. Concern for CNS involvement. Request for diagnostic lumbar puncture EXAM: DIAGNOSTIC LUMBAR PUNCTURE UNDER FLUOROSCOPIC GUIDANCE FLUOROSCOPY TIME:  Radiation Exposure Index (as provided by the fluoroscopic device): 5.4 mGy If the device does not provide the exposure index: Fluoroscopy Time (in minutes and seconds):  0 minutes, 12 seconds Number of Acquired Images:  1 PROCEDURE: Informed consent was obtained from the patient prior to the procedure, including potential complications of headache, allergy,  and pain. With the patient prone, the lower back was prepped with Betadine. 1% Lidocaine was used for local anesthesia. Lumbar puncture was performed at the L2-L3 level using a 20 gauge needle with return of clear colorless CSF with an opening pressure of 12 cm water. Approximately 10 ml of CSF were obtained for laboratory studies. The patient tolerated the procedure well and there were no apparent complications. IMPRESSION: Technically successful lumbar puncture from L2-L3 level without complication. Procedure performed by Brayton El PA-C supervised by Dr. Marliss Coots Electronically Signed   By: Marliss Coots M.D.   On: 06/08/2023 14:31   IR IMAGING GUIDED PORT INSERTION Result Date: 06/06/2023 INDICATION: 62 year old male referred for port catheter EXAM: IMAGE GUIDED PORT CATHETER MEDICATIONS: None ANESTHESIA/SEDATION: Moderate (conscious) sedation was employed during this procedure. A total of Versed 2.0 mg and Fentanyl 100 mcg was administered intravenously. Moderate Sedation Time: 20 minutes. The patient's level of consciousness and vital signs were monitored continuously by radiology nursing throughout the procedure under my direct supervision. FLUOROSCOPY TIME:  Fluoroscopy Time:   (1 mGy). COMPLICATIONS: None PROCEDURE: Informed written consent was obtained from the patient after a discussion of the risks, benefits, and alternatives to treatment. Questions regarding the procedure were encouraged and answered. The right neck and chest were prepped with chlorhexidine in a sterile fashion, and a sterile drape was applied covering the operative field. Maximum barrier sterile technique with sterile gowns and gloves were used for the procedure. A timeout was performed prior to the initiation of the procedure. Ultrasound  survey was performed with images stored and sent to PACs. Right IJ vein documented to be patent. The right neck and chest was prepped with chlorhexidine, and draped in the usual sterile  fashion using maximum barrier technique (cap and mask, sterile gown, sterile gloves, large sterile sheet, hand hygiene and cutaneous antiseptic). Local anesthesia was attained by infiltration with 1% lidocaine without epinephrine. Ultrasound demonstrated patency of the right internal jugular vein, and this was documented with an image. Under real-time ultrasound guidance, this vein was accessed with a 21 gauge micropuncture needle and image documentation was performed. A small dermatotomy was made at the access site with an 11 scalpel. A 0.018" wire was advanced into the SVC and used to estimate the length of the internal catheter. The access needle exchanged for a 105F micropuncture vascular sheath. The 0.018" wire was then removed and a 0.035" wire advanced into the IVC. An appropriate location for the subcutaneous reservoir was selected below the clavicle and an incision was made through the skin and underlying soft tissues. The subcutaneous tissues were then dissected using a combination of blunt and sharp surgical technique and a pocket was formed. A single lumen power injectable portacatheter was then tunneled through the subcutaneous tissues from the pocket to the dermatotomy and the port reservoir placed within the subcutaneous pocket. The venous access site was then serially dilated and a peel away vascular sheath placed over the wire. The wire was removed and the port catheter advanced into position under fluoroscopic guidance. The catheter tip is positioned in the cavoatrial junction. This was documented with a spot image. The portacatheter was then tested and found to flush and aspirate well. The port was flushed with saline followed by 100 units/mL heparinized saline. The pocket was then closed in two layers using first subdermal inverted interrupted absorbable sutures followed by a running subcuticular suture. The epidermis was then sealed with Dermabond. The dermatotomy at the venous access site was  also seal with Dermabond. Patient tolerated the procedure well and remained hemodynamically stable throughout. No complications encountered and no significant blood loss encountered IMPRESSION: Status post right IJ port catheter Signed, Yvone Neu. Miachel Roux, RPVI [facility] Electronically Signed   By: Gilmer Mor D.O.   On: 06/06/2023 14:30   CT Angio Chest PE W and/or Wo Contrast Result Date: 05/29/2023 CLINICAL DATA:  Septic arterial embolism. EXAM: CT ANGIOGRAPHY CHEST WITH CONTRAST TECHNIQUE: Multidetector CT imaging of the chest was performed using the standard protocol during bolus administration of intravenous contrast. Multiplanar CT image reconstructions and MIPs were obtained to evaluate the vascular anatomy. RADIATION DOSE REDUCTION: This exam was performed according to the departmental dose-optimization program which includes automated exposure control, adjustment of the mA and/or kV according to patient size and/or use of iterative reconstruction technique. CONTRAST:  75mL OMNIPAQUE IOHEXOL 350 MG/ML SOLN COMPARISON:  05/20/2023 chest CT. FINDINGS: Cardiovascular: Satisfactory opacification of the pulmonary arteries to the segmental level. No evidence of pulmonary embolism. Normal heart size. No pericardial effusion. 3.8 cm diameter ascending aorta. Aortic atherosclerosis. Mediastinum/Nodes: No worrisome lymph node. Lungs/Pleura: Generalized airway thickening since prior. There is airway thickening that is focally marked in the posterior segment of the left upper lobe, largely acute when compared to recent prior. Patchy areas of ground-glass density in the bilateral lungs, mild. Branching density over the right diaphragm and nodular density over the left diaphragm, a pre-existing nodule measuring approximately 1 cm, three-month follow-up already recommended in the setting of  active malignancy. Paraseptal emphysema at the apices. Upper  Abdomen: Known hepatic metastatic disease.  No acute finding Musculoskeletal: No emergent finding. Review of the MIP images confirms the above findings. IMPRESSION: 1. Bronchitis with patchy areas of mild bronchopneumonia. 2. Known malignancy with pre-existing pulmonary nodule and recommended follow-up. 3. Atherosclerosis including the coronary arteries. Electronically Signed   By: Tiburcio Pea M.D.   On: 05/29/2023 04:40   DG Chest Portable 1 View Result Date: 05/29/2023 CLINICAL DATA:  Shortness of breath EXAM: PORTABLE CHEST 1 VIEW COMPARISON:  05/17/2023 FINDINGS: Heart and mediastinal contours are within normal limits. No focal opacities or effusions. No acute bony abnormality. IMPRESSION: No active disease. Electronically Signed   By: Charlett Nose M.D.   On: 05/29/2023 03:39   DG Swallowing Func-Speech Pathology Result Date: 05/24/2023 Table formatting from the original result was not included. Modified Barium Swallow Study Patient Details Name: Jordan Calderon MRN: [id number] Date of Birth: 06-25-1960 Today's Date: 05/24/2023 HPI/PMH: HPI: Amirali Canfield is a 62 yo male who presented to [facility] on 11/27 with lightheadedness, R ear pain, R ptosis, HA, cough, n/v. 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 CT NECK FINDINGS:  Pharynx and larynx: There is a large right nasopharyngeal mass that  extends inferiorly to the level the greater horn of the hyoid bone  on right. This mass involves the right-sided adenoid and palatine  tonsils, the right parapharyngeal space, the right masticator space,  the right parotid space. Superiorly this mass likely extends to the  level of the foramen ovale bilaterally.  CT CHEST showed liver lesions concerning for mets; patulous esophagus. Results of biopsies pending. EGD 12/2: gastric ulcer, severe esophagitis, candidiasis. ENT and oncology following. PMH includes HIV on Hart therapy, HLD, depression, anxiety, AKI.  Clinical Impression: Pt demonstrates mild oropharyngeal dysphagia characterized by mild residue in the vallecula and pyriform sinuses. There is appearance of mildly decreased hyoid excursion but very good laryngeal elevation. Pt may have decreased base of tongue retraction and decreased pharyngeal contraction that lead to pharyngeal residuals. The pt clears with an effortful swallow. Residue most significant with thin liquids. There was one instance of trace penetration post swallow with wet vocal quality and throat clear ejecting. Pt recommended to continue foods and drinks of choice. Suggested effortful swallows as a way to clear residue, but also to use as part of an initial home exercise program for pre radiation treatment (potential). Introduced concepts of radiation fibrosis, dysphagia and the importance of continueing swallowing. Pt not yet ready to absorb this and will need further education. Pt to f/u with OP SLP for further education and HEP. Factors that may increase risk of adverse event in presence of aspiration Rubye Oaks [employer] Clearance Coots 2021): No data recorded Recommendations/Plan: Swallowing Evaluation Recommendations Swallowing Evaluation Recommendations Recommendations: PO diet PO Diet Recommendation: Regular; Thin liquids (Level 0) Liquid Administration via: Cup; Straw Medication Administration: Whole meds with liquid Supervision: Patient able to self-feed Swallowing strategies  : effortful swallow Postural changes: Stay upright 30-60 min after meals Oral care recommendations: Oral care BID (2x/day) Treatment Plan Treatment Plan Treatment recommendations: Defer treatment plan to SLP at other venue (see follow-up recommendations) Follow-up recommendations: Outpatient SLP Recommendations Recommendations for follow up therapy are one component of a multi-disciplinary discharge planning process, led by the attending physician.  Recommendations may be updated based on patient status, additional functional  criteria and insurance authorization. Assessment: Orofacial Exam: Orofacial Exam Oral Cavity - Dentition: Adequate natural dentition  Anatomy: No data recorded Boluses Administered: Boluses Administered Boluses Administered: Thin liquids (Level 0); Mildly thick liquids (Level 2, nectar thick); Moderately thick liquids (Level 3, honey thick); Puree; Solid  Oral Impairment Domain: Oral Impairment Domain Lip Closure: No labial escape Tongue control during bolus hold: Cohesive bolus between tongue to palatal seal Bolus preparation/mastication: Timely and efficient chewing and mashing Bolus transport/lingual motion: Brisk tongue motion Oral residue: Complete oral clearance Location of oral residue : N/A Initiation of pharyngeal swallow : Posterior laryngeal surface of the epiglottis  Pharyngeal Impairment Domain: Pharyngeal Impairment Domain Soft palate elevation: No bolus between soft palate (SP)/pharyngeal wall (PW) Laryngeal elevation: Complete superior movement of thyroid cartilage with complete approximation of arytenoids to epiglottic petiole Anterior hyoid excursion: Partial anterior movement Epiglottic movement: Complete inversion Laryngeal vestibule closure: Complete, no air/contrast in laryngeal vestibule Pharyngeal stripping wave : Present - diminished Pharyngeal contraction (A/P view only): N/A Pharyngoesophageal segment opening: Complete distension and complete duration, no obstruction of flow Tongue base retraction: Trace column of contrast or air between tongue base and PPW Pharyngeal residue: Trace residue within or on pharyngeal structures; Collection of residue within or on pharyngeal structures Location of pharyngeal residue: Valleculae; Pyriform sinuses  Esophageal Impairment Domain: Esophageal Impairment Domain Esophageal clearance upright position: Esophageal retention Pill: Pill Consistency administered: Thin liquids (Level 0) Penetration/Aspiration Scale Score: No data recorded Compensatory  Strategies: Compensatory Strategies Compensatory strategies: Yes Effortful swallow: Effective Chin tuck: Ineffective   General Information: Caregiver present: No  Diet Prior to this Study: Regular; Thin liquids (Level 0)   Temperature : Normal   Respiratory Status: WFL   Supplemental O2: None (Room air)   No data recorded Behavior/Cognition: Alert; Cooperative Self-Feeding Abilities: Able to self-feed Baseline vocal quality/speech: Dysphonic Volitional Cough: Able to elicit Volitional Swallow: Able to elicit No data recorded Goal Planning: No data recorded No data recorded No data recorded No data recorded No data recorded Pain: No data recorded End of Session: Start Time:SLP Start Time (ACUTE ONLY): 1115 Stop Time: SLP Stop Time (ACUTE ONLY): 1136 Time Calculation:SLP Time Calculation (min) (ACUTE ONLY): 21 min Charges: SLP Evaluations $ SLP Speech Visit: 1 Visit SLP Evaluations $BSS Swallow: 1 Procedure $MBS Swallow: 1 Procedure $Swallowing Treatment: 1 Procedure SLP visit diagnosis: SLP Visit Diagnosis: Dysphagia, oropharyngeal phase (R13.12) Past Medical History: Past Medical History: Diagnosis Date  Anxiety   Cellulitis 02/11/2014  LEFT ARM  Depression   Headache(784.0)   HIV (human immunodeficiency virus infection) (HCC)  Past Surgical History: Past Surgical History: Procedure Laterality Date  ARM HARDWARE REMOVAL Left   arm surgery    HEMORRHOID SURGERY    NASOPHARYNGEAL BIOPSY N/A 05/19/2023  Procedure: NASOPHARYNGEAL BIOPSY;  Surgeon: Newman Pies, MD;  Location: [facility] OR;  Service: ENT;  Laterality: N/A; [place], Riley Nearing 05/24/2023, 12:22 PM  CT ABDOMEN PELVIS W CONTRAST Result Date: 05/23/2023 CLINICAL DATA:  Abnormal neck and chest CT, nasopharyngeal mass EXAM: CT ABDOMEN AND PELVIS WITH CONTRAST TECHNIQUE: Multidetector CT imaging of the abdomen and pelvis was performed using the standard protocol following bolus administration of intravenous contrast. RADIATION DOSE REDUCTION: This exam was performed  according to the departmental dose-optimization program which includes automated exposure control, adjustment of the mA and/or kV according to patient size and/or use of iterative reconstruction technique. CONTRAST:  75mL OMNIPAQUE IOHEXOL 350 MG/ML SOLN COMPARISON:  CT 05/20/2023 FINDINGS: Lower chest: See separately dictated recently performed chest CT. Left lung base pulmonary nodule measuring 6 mm on series 4, image 21. No acute airspace disease.  Hepatobiliary: Multiple hypodense liver masses concerning for metastatic disease. Index right hepatic lobe mass lesion measures 2.8 x 2.3 cm on series 3, image 22. Index left hepatic lobe lesion measures 2.4 x 2.6 cm on series 3, image 31. Numerous additional hypodense liver masses. No calcified gallstone or biliary dilatation. Pancreas: Unremarkable. No pancreatic ductal dilatation or surrounding inflammatory changes. Spleen: Normal in size without focal abnormality. Adrenals/Urinary Tract: Adrenal glands are normal. Kidneys show no hydronephrosis. The bladder is unremarkable Stomach/Bowel: Stomach nonenlarged. Focal wall thickening of the mid gastric lumen, series 3, image 36 and coronal series 6, image 77. On sagittal views, ulcer crater at the lesser curvature of stomach, series 7, image 87 and 88. No small bowel distension. No acute bowel wall thickening. Vascular/Lymphatic: Nonaneurysmal aorta with mild atherosclerosis. Mildly enlarged gastrohepatic lymph nodes measuring 14 x 14 mm on series 3, image 27. Negative for retroperitoneal or pelvic adenopathy. Reproductive: Prostate is unremarkable. Other: Negative for pelvic effusion or free air for small fat containing inguinal hernias. Musculoskeletal: No acute or suspicious osseous abnormality. IMPRESSION: 1. Multiple hypodense liver masses concerning for metastatic disease. Mildly enlarged gastrohepatic lymph nodes also suspicious for metastatic disease. 2. Focal wall thickening of the mid gastric lumen with ulcer  crater at the lesser curvature of stomach. Correlate with endoscopy if not already performed. 3. Left lung base pulmonary nodule measuring 6 mm. See separately dictated recently performed chest CT. 4. Aortic atherosclerosis. Aortic Atherosclerosis (ICD10-I70.0). Electronically Signed   By: Jasmine Pang M.D.   On: 05/23/2023 17:52   US BIOPSY (LIVER) Result Date: 05/23/2023 INDICATION: 62 year old with multiple liver lesions. Findings are suggestive for metastatic disease. Diagnosis is needed. EXAM: ULTRASOUND-GUIDED LIVER LESION BIOPSY MEDICATIONS: Moderate sedation ANESTHESIA/SEDATION: Moderate (conscious) sedation was employed during this procedure. A total of Versed 1 mg and Fentanyl 25 mcg was administered intravenously by the radiology nurse. Total intra-service moderate Sedation Time: 18 minutes. The patient's level of consciousness and vital signs were monitored continuously by radiology nursing throughout the procedure under my direct supervision. FLUOROSCOPY TIME:  None COMPLICATIONS: None immediate. PROCEDURE: Informed written consent was obtained from the patient after a thorough discussion of the procedural risks, benefits and alternatives. All questions were addressed. Maximal Sterile Barrier Technique was utilized including caps, mask, sterile gowns, sterile gloves, sterile drape, hand hygiene and skin antiseptic. A timeout was performed prior to the initiation of the procedure. Abdomen was evaluated with ultrasound. A right hepatic lesion was targeted. The right side of the abdomen was prepped with chlorhexidine and sterile field was created. Skin was anesthetized using 1% lidocaine. Small incision was made. Using ultrasound guidance, 17 gauge coaxial needle was directed into a right hepatic lesion. Total of 3 core biopsies were performed with an 18 gauge core device. Three adequate specimens obtained and placed in formalin. 17 gauge coaxial needle was removed without complication. Bandage placed  over the puncture site. FINDINGS: Several hyperechoic lesions scattered throughout the liver. A lesion in the right hepatic lobe was targeted. Biopsy needle confirmed within the lesion. Three adequate specimens obtained. No immediate bleeding or hematoma formation. IMPRESSION: Successful ultrasound-guided core biopsies from a right hepatic lesion. Electronically Signed   By: Richarda Overlie M.D.   On: 05/23/2023 10:22   CT CHEST W CONTRAST Result Date: 05/20/2023 CLINICAL DATA:  Staging colon cancer. New nasopharyngeal mass. * Tracking Code: BO * EXAM: CT CHEST WITH CONTRAST TECHNIQUE: Multidetector CT imaging of the chest was performed during intravenous contrast administration. RADIATION DOSE REDUCTION: This exam was  performed according to the departmental dose-optimization program which includes automated exposure control, adjustment of the mA and/or kV according to patient size and/or use of iterative reconstruction technique. CONTRAST:  50mL OMNIPAQUE IOHEXOL 350 MG/ML SOLN COMPARISON:  None Available. FINDINGS: Cardiovascular: Heart is nonenlarged. Coronary artery calcifications are seen. Please correlate for other coronary risk factors. Diameter of the ascending aorta measures 4.4 by 4.4 cm at the level of the right pulmonary artery. More normal caliber arch and descending thoracic aorta. Mild vascular calcifications along the aorta. Mediastinum/Nodes: Preserved thyroid gland. Slightly patulous thoracic esophagus with some luminal air and fluid. No specific abnormal lymph node enlargement present in the axillary region, hilum or mediastinum. A few small less than 1 cm size mediastinal and right hilar nodes are seen, nonpathologic by size criteria. Lungs/Pleura: No consolidation, pneumothorax or effusion. Slight breathing motion. Left lower lobe lung nodule identified measuring 8 by 3 mm on series 4, image 136. Similar focus in the right lung base measuring 7 mm on series 4 image 123. Few other tiny nodules in  the right lung such as series 4, image 119 in the lower lobe. Few patchy areas of ground-glass type areas are seen as well. Apical pleural thickening identified with some paraseptal and centrilobular emphysematous changes. Some areas of interstitial septal thickening as well. Upper Abdomen: Multiple liver masses are identified. At least 15 lesions are seen. Example in segment 2 on series 3, image 147 measures 2.7 by 2.6 cm. Middle lobe lesion on series 3, image 174 measures 2.7 by 2.7 cm. Adrenal glands are preserved. There also abnormal nodes identified such as lesser curve of the stomach measuring 16 x 20 mm. Musculoskeletal: Mild degenerative changes along the spine. IMPRESSION: Numerous liver metastases. Upper abdominal abnormal nodes. Further workup of the abdomen and pelvis may be useful when clinically appropriate if there is no known previous. Patulous esophagus with slight wall thickening. Please correlate with symptoms and additional evaluation when clinically appropriate. Small mediastinal lymph nodes.  Attention on follow-up. Small lung nodules measuring up to 8 mm. Recommend follow up in 3 months or as per the patient's primary neoplasm. Coronary artery calcifications. In addition dilatation of the ascending aorta. Recommend annual imaging followup by CTA or MRA. This recommendation follows 2010 ACCF/AHA/AATS/ACR/ASA/SCA/SCAI/SIR/STS/SVM Guidelines for the Diagnosis and Management of Patients with Thoracic Aortic Disease. Circulation. 2010; 121: Z610-R604. Aortic aneurysm NOS (ICD10-I71.9) Aortic Atherosclerosis (ICD10-I70.0) and Emphysema (ICD10-J43.9). Electronically Signed   By: Karen Kays M.D.   On: 05/20/2023 14:09   CT SOFT TISSUE NECK W CONTRAST Result Date: 05/18/2023 CLINICAL DATA:  Neck mass EXAM: CT NECK WITH CONTRAST TECHNIQUE: Multidetector CT imaging of the neck was performed using the standard protocol following the bolus administration of intravenous contrast. RADIATION DOSE  REDUCTION: This exam was performed according to the departmental dose-optimization program which includes automated exposure control, adjustment of the mA and/or kV according to patient size and/or use of iterative reconstruction technique. CONTRAST:  75mL OMNIPAQUE IOHEXOL 350 MG/ML SOLN COMPARISON:  CTA head/neck 05/17/23 FINDINGS: Pharynx and larynx: There is a large right nasopharyngeal mass that extends inferiorly to the level the greater horn of the hyoid bone on right. This mass involves the right-sided adenoid and palatine tonsils, the right parapharyngeal space, the right masticator space, the right parotid space. Superiorly this mass likely extends to the level of the foramen ovale bilaterally. Salivary glands: The lateral extension of the large nasopharyngeal mass abuts the right parotid gland in the superior aspect of the right submandibular  gland. There is a 9 mm hypodense lesion in the left parotid gland. Thyroid: Normal. Lymph nodes: There is a 9 mm hypodense lesion in the left parotid gland (series 2, image 46). It is unclear if this represents a necrotic lymph node or an intraparotid cyst. Vascular: Negative. Limited intracranial: Negative. Visualized orbits: Negative. Mastoids and visualized paranasal sinuses: No middle ear or mastoid effusion. Paranasal sinuses are clear. Skeleton: No acute or aggressive process. Upper chest: Negative. Other: None IMPRESSION: 1. Large right nasopharyngeal mass that extends inferiorly to the level the greater horn of the hyoid bone on the right. This mass involves the right-sided adenoid and palatine tonsils, the right parapharyngeal space, the right masticator space, the right parotid space. Superiorly this mass likely extends to the level of the foramen ovale bilaterally. 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 Recommend ENT consultation. 2. A 9 mm hypodense lesion in the left parotid gland. It is unclear if this represents a necrotic  lymph node or an intraparotid cyst. Electronically Signed   By: Lorenza Cambridge M.D.   On: 05/18/2023 11:31   MR ANGIO HEAD WO CONTRAST Result Date: 05/17/2023 CLINICAL DATA:  Initial evaluation for persistent dizziness. EXAM: MRA HEAD WITHOUT CONTRAST TECHNIQUE: Angiographic images of the Circle of Willis were acquired using MRA technique without intravenous contrast. COMPARISON:  CTA from earlier the same day. FINDINGS: Anterior circulation: Both internal carotid arteries are widely patent through the siphons without stenosis or other abnormality. Again seen is a 3 mm outpouching extending posteriorly and laterally from the left ICA terminus (series 2, image 93). Possible small vessel emanating from its apex, not entirely certain. Finding could reflect an vascular infundibulum versus aneurysm. A1 segments patent bilaterally, with the right dominant. Normal anterior communicating artery complex. Anterior cerebral arteries patent without stenosis. No M1 stenosis or occlusion. Distal MCA branches perfused and symmetric. Posterior circulation: Visualized V4 segments patent without stenosis. Right PICA patent. Left PICA origin not seen. Basilar patent without stenosis. Superior cerebellar and posterior cerebral arteries widely patent bilaterally. Anatomic variants: As above. Other: Previously identified mass at the right skull base noted, partially visualized, better evaluated on prior CT. IMPRESSION: 1. 3 mm outpouching extending posteriorly and laterally from the left ICA terminus, potentially reflecting an vascular infundibulum versus aneurysm. Attention at follow-up recommended. 2. Otherwise normal intracranial MRA. No large vessel occlusion or other emergent finding. Electronically Signed   By: Rise Mu M.D.   On: 05/17/2023 20:58   CT ANGIO HEAD NECK W WO CM Result Date: 05/17/2023 CLINICAL DATA:  Cerebral vasospasm suspected EXAM: CT ANGIOGRAPHY HEAD AND NECK WITH AND WITHOUT CONTRAST  TECHNIQUE: Multidetector CT imaging of the head and neck was performed using the standard protocol during bolus administration of intravenous contrast. Multiplanar CT image reconstructions and MIPs were obtained to evaluate the vascular anatomy. Carotid stenosis measurements (when applicable) are obtained utilizing NASCET criteria, using the distal internal carotid diameter as the denominator. RADIATION DOSE REDUCTION: This exam was performed according to the departmental dose-optimization program which includes automated exposure control, adjustment of the mA and/or kV according to patient size and/or use of iterative reconstruction technique. CONTRAST:  75mL OMNIPAQUE IOHEXOL 350 MG/ML SOLN COMPARISON:  None Available. FINDINGS: CT HEAD FINDINGS Brain: No evidence of acute infarction, hemorrhage, hydrocephalus, extra-axial collection or intraparenchymal mass lesion/mass effect. Vascular: See below. Skull: No acute fracture. Sinuses/Orbits: Clear sinuses.  No acute orbital findings. Other: No mastoid effusions. Review of the MIP images confirms the above findings CTA NECK  FINDINGS Aortic arch: Great vessel origins are patent without significant stenosis. Right carotid system: No evidence of dissection, stenosis (50% or greater), or occlusion. Left carotid system: No evidence of dissection, stenosis (50% or greater), or occlusion. Mild atherosclerosis at the carotid bifurcation. Vertebral arteries: Codominant. No evidence of dissection, stenosis (50% or greater), or occlusion. Skeleton: No acute abnormality on limited assessment. 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 Review of the MIP images confirms the above findings CTA HEAD FINDINGS Anterior circulation: Bilateral intracranial ICAs, MCAs, and ACAs are patent without proximal hemodynamically significant stenosis. Hypoplastic left A1 ACA, likely congenital.  Approximately 2 mm posteriorly inferiorly directed outpouching arising from the left carotid terminus (for example see series 605, image 111 and series 604, image 154 Posterior circulation: Tortuous vertebral arteries. The vertebral arteries, basilar artery and bilateral posterior cerebral arteries are patent without proximal hemodynamically significant stenosis. Venous sinuses: As permitted by contrast timing, patent. Anatomic variants: Described above. Review of the MIP images confirms the above findings IMPRESSION: 1. 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 Recommend ENT consultation, correlation with direct inspection, and follow-up dedicated CT of the neck with contrast for better characterization. 2. No emergent large vessel occlusion or proximal hemodynamically significant stenosis. 3. Approximately 2 mm posteriorly inferiorly directed outpouching arising from the left carotid terminus which could represent an infundibulum with vessel not well seen versus aneurysm. 4. Aortic Atherosclerosis (ICD10-I70.0) and Emphysema (ICD10-J43.9). Electronically Signed   By: Feliberto Harts M.D.   On: 05/17/2023 14:55   DG Chest Port 1 View Result Date: 05/17/2023 CLINICAL DATA:  Worsening cough and dry heaving with shortness of breath EXAM: PORTABLE CHEST 1 VIEW COMPARISON:  Chest radiograph dated 05/11/2023 FINDINGS: Normal lung volumes. No focal consolidations. No pleural effusion or pneumothorax. The heart size and mediastinal contours are within normal limits. No acute osseous abnormality. IMPRESSION: No active disease. Electronically Signed   By: Agustin Cree M.D.   On: 05/17/2023 14:04   ECHOCARDIOGRAM COMPLETE Result Date: 05/12/2023    ECHOCARDIOGRAM REPORT   Patient Name:   Jordan Calderon Date of Exam: 05/12/2023 Medical Rec #:  [id number]       Height:       69.0 in Accession #:    [id number]      Weight:        154.6 lb Date of Birth:  [birth year]       BSA:          1.852 m Patient Age:    62 years        BP:           124/67 mmHg Patient Gender: M               HR:           73 bpm. Exam Location:  Inpatient Procedure: 2D Echo, Color Doppler and Cardiac Doppler Indications:    Syncope  History:        Patient has no prior history of Echocardiogram examinations.                 Signs/Symptoms:Syncope; Risk Factors:HIV, Polysubstance Abuse                 and Former Smoker.  Sonographer:    Milbert Coulter Referring Phys: 5 JARED M GARDNER IMPRESSIONS  1. Left ventricular ejection fraction, by estimation, is 60 to  65%. The left ventricle has normal function. The left ventricle has no regional wall motion abnormalities. Left ventricular diastolic parameters were normal.  2. Right ventricular systolic function is normal. The right ventricular size is normal. There is normal pulmonary artery systolic pressure.  3. The mitral valve is normal in structure. No evidence of mitral valve regurgitation. No evidence of mitral stenosis.  4. The aortic valve is tricuspid. There is mild calcification of the aortic valve. There is mild thickening of the aortic valve. Aortic valve regurgitation is not visualized. Aortic valve sclerosis/calcification is present, without any evidence of aortic stenosis.  5. Aortic dilatation noted. There is mild dilatation of the ascending aorta, measuring 40 mm.  6. The inferior vena cava is normal in size with greater than 50% respiratory variability, suggesting right atrial pressure of 3 mmHg. Comparison(s): No prior Echocardiogram. Conclusion(s)/Recommendation(s): Otherwise normal echocardiogram, with minor abnormalities described in the report. Mild dilation of ascending aorta. FINDINGS  Left Ventricle: Left ventricular ejection fraction, by estimation, is 60 to 65%. The left ventricle has normal function. The left ventricle has no regional wall motion abnormalities. The left ventricular internal  cavity size was normal in size. There is  no left ventricular hypertrophy. Left ventricular diastolic parameters were normal. Right Ventricle: The right ventricular size is normal. No increase in right ventricular wall thickness. Right ventricular systolic function is normal. There is normal pulmonary artery systolic pressure. The tricuspid regurgitant velocity is 2.54 m/s, and  with an assumed right atrial pressure of 3 mmHg, the estimated right ventricular systolic pressure is 28.8 mmHg. Left Atrium: Left atrial size was normal in size. Right Atrium: Right atrial size was normal in size. Prominent Chiari network. Pericardium: There is no evidence of pericardial effusion. Mitral Valve: The mitral valve is normal in structure. No evidence of mitral valve regurgitation. No evidence of mitral valve stenosis. Tricuspid Valve: The tricuspid valve is normal in structure. Tricuspid valve regurgitation is mild . No evidence of tricuspid stenosis. Aortic Valve: The aortic valve is tricuspid. There is mild calcification of the aortic valve. There is mild thickening of the aortic valve. Aortic valve regurgitation is not visualized. Aortic valve sclerosis/calcification is present, without any evidence of aortic stenosis. Aortic valve mean gradient measures 4.0 mmHg. Aortic valve peak gradient measures 8.3 mmHg. Aortic valve area, by VTI measures 4.09 cm. Pulmonic Valve: The pulmonic valve was grossly normal. Pulmonic valve regurgitation is trivial. No evidence of pulmonic stenosis. Aorta: Aortic dilatation noted. There is mild dilatation of the ascending aorta, measuring 40 mm. Venous: The inferior vena cava is normal in size with greater than 50% respiratory variability, suggesting right atrial pressure of 3 mmHg. IAS/Shunts: No atrial level shunt detected by color flow Doppler.  LEFT VENTRICLE PLAX 2D LVIDd:         4.60 cm   Diastology LVIDs:         3.10 cm   LV e' medial:    7.18 cm/s LV PW:         1.10 cm   LV E/e'  medial:  6.4 LV IVS:        1.20 cm   LV e' lateral:   16.00 cm/s LVOT diam:     2.40 cm   LV E/e' lateral: 2.8 LV SV:         94 LV SV Index:   51 LVOT Area:     4.52 cm  RIGHT VENTRICLE RV Basal diam:  3.00 cm RV Mid diam:  2.80 cm RV S prime:     13.60 cm/s TAPSE (M-mode): 2.0 cm LEFT ATRIUM             Index        RIGHT ATRIUM           Index LA diam:        3.80 cm 2.05 cm/m   RA Area:     13.10 cm LA Vol (A2C):   34.3 ml 18.52 ml/m  RA Volume:   29.50 ml  15.93 ml/m LA Vol (A4C):   45.1 ml 24.35 ml/m LA Biplane Vol: 40.7 ml 21.98 ml/m  AORTIC VALVE AV Area (Vmax):    3.96 cm AV Area (Vmean):   3.80 cm AV Area (VTI):     4.09 cm AV Vmax:           144.00 cm/s AV Vmean:          94.700 cm/s AV VTI:            0.230 m AV Peak Grad:      8.3 mmHg AV Mean Grad:      4.0 mmHg LVOT Vmax:         126.00 cm/s LVOT Vmean:        79.600 cm/s LVOT VTI:          0.208 m LVOT/AV VTI ratio: 0.90  AORTA Ao Root diam: 4.00 cm Ao Asc diam:  4.00 cm MITRAL VALVE               TRICUSPID VALVE MV Area (PHT): 2.33 cm    TR Peak grad:   25.8 mmHg MV Decel Time: 325 msec    TR Vmax:        254.00 cm/s MV E velocity: 45.60 cm/s MV A velocity: 81.20 cm/s  SHUNTS MV E/A ratio:  0.56        Systemic VTI:  0.21 m                            Systemic Diam: 2.40 cm Jodelle Red MD Electronically signed by Jodelle Red MD Signature Date/Time: 05/12/2023/5:59:52 PM    Final    DG Chest Portable 1 View Result Date: 05/11/2023 CLINICAL DATA:  Cough. EXAM: PORTABLE CHEST 1 VIEW COMPARISON:  07/01/2018. FINDINGS: Bilateral lung fields are clear. No consolidation or lung collapse. No pulmonary edema. Bilateral costophrenic angles are clear. Normal cardio-mediastinal silhouette. No acute osseous abnormalities. The soft tissues are within normal limits. IMPRESSION: *No active disease. Electronically Signed   By: Jules Schick M.D.   On: 05/11/2023 13:47   CT Head Wo Contrast Result Date: 05/11/2023 CLINICAL  DATA:  Headache, new onset (Age >= 51y) EXAM: CT HEAD WITHOUT CONTRAST TECHNIQUE: Contiguous axial images were obtained from the base of the skull through the vertex without intravenous contrast. RADIATION DOSE REDUCTION: This exam was performed according to the departmental dose-optimization program which includes automated exposure control, adjustment of the mA and/or kV according to patient size and/or use of iterative reconstruction technique. COMPARISON:  None Available. FINDINGS: Brain: No evidence of acute infarction, hemorrhage, hydrocephalus, extra-axial collection or mass lesion/mass effect. Vascular: No hyperdense vessel or unexpected calcification. Skull: Normal. Negative for fracture or focal lesion. Sinuses/Orbits: No middle ear or mastoid effusion. Paranasal sinuses are notable for mucosal thickening of the floor of the right maxillary sinus. Orbits are unremarkable. Other: None. IMPRESSION: No acute intracranial abnormality. Electronically Signed   By:  Lorenza Cambridge M.D.   On: 05/11/2023 12:33    CODE STATUS:  Code Status History     Date Active Date Inactive Code Status Order ID Comments User Context   05/29/2023 0850 05/30/2023 2252 Full Code [id number]  Maryln Gottron, MD ED   05/17/2023 1906 05/24/2023 1834 Full Code [id number]  Rometta Emery, MD Inpatient   05/17/2023 1532 05/17/2023 1906 Full Code [id number]  Nolberto Hanlon, MD ED   05/11/2023 2024 05/12/2023 2308 Full Code [id number]  Hillary Bow, DO Inpatient   07/01/2018 1624 07/03/2018 1537 Full Code [id number]  Dorcas Carrow, MD Inpatient   02/10/2014 2054 02/12/2014 0031 Full Code [id number]  Christen Bame, MD Inpatient    Questions for Most Recent Historical Code Status (Order [id number])     Question Answer   By: Consent: discussion documented in EHR            Orders Placed This Encounter  Procedures   Hepatitis B DNA, ultraquantitative, PCR    Standing Status:   Future    Number of Occurrences:   1     Expiration Date:   06/08/2024   CBC with Differential/Platelet    Standing Status:   Future    Expected Date:   06/16/2023    Expiration Date:   06/08/2024   Comprehensive metabolic panel    Standing Status:   Future    Expected Date:   06/16/2023    Expiration Date:   06/08/2024   Lactate dehydrogenase    Standing Status:   Future    Expected Date:   06/16/2023    Expiration Date:   06/08/2024   Magnesium    Standing Status:   Future    Expected Date:   06/16/2023    Expiration Date:   06/08/2024   Uric acid    Standing Status:   Future    Expected Date:   06/16/2023    Expiration Date:   06/08/2024   Phosphorus    Standing Status:   Future    Expected Date:   06/16/2023    Expiration Date:   06/08/2024     Future Appointments  Date Time Provider Department Center  06/12/2023  2:30 PM [facility] FLUSH [facility] None  06/16/2023  9:00 AM [facility] LAB [facility] None  06/16/2023  9:30 AM Kailin Leu, MD [facility] None  06/28/2023  9:15 AM [facility] FLUSH [facility] None  06/28/2023  9:45 AM Quentina Fronek, MD [facility] None  06/28/2023 10:30 AM [facility] INFUSION [facility] None  06/30/2023  2:15 PM [facility] FLUSH [facility] None  08/23/2023  9:30 AM Comer, Belia Heman, MD [facility]  08/30/2023 10:10 AM Armbruster, [name], MD [facility]      This document was completed utilizing speech recognition software. Grammatical errors, random word insertions, pronoun errors, and incomplete sentences are an occasional consequence of this system due to software limitations, ambient noise, and hardware issues. Any formal questions or concerns about the content, text or information contained within the body of this dictation should be directly addressed to the provider for clarification.

## 2023-06-09 NOTE — Assessment & Plan Note (Addendum)
-   He does have a past history of hepatitis B infection  -Hepatitis B surface antigen is negative and core total antibody was positive.  It could be from his past infection.  We will check HBV DNA.  -Since patient is already on Symtuza, there is low risk of reactivation and hence we will proceed with rituximab along with rest of the chemotherapy regimen

## 2023-06-09 NOTE — Assessment & Plan Note (Addendum)
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 Plan to monitor response of mass to lymphoma treatment.  -Discussed diagnosis, prognosis, staging, plan of care, treatment options.  Reviewed NCCN guidelines and discussed management options.  - FISH testing positive for BCL6 and negative for Bcl-2 and C-MYC.  -Staging PET/CT obtained on 06/05/2023.  Results pending.  -As patient was complaining of recent onset headaches and tremors, we obtained CSF analysis to make sure there is no lymphoma involvement.  Underwent LP on 06/08/2023.  Cytology from CSF fluid pending.  Imaging studies showed no gross abnormalities intracranially.  -Hepatitis B core antibody positive and surface antigen negative, likely from past infection.  Discussed his case with his infectious disease doctor Dr. Luciana Axe.  Since patient is on Symtuza, he does not need additional treatment for this.  Submitted request for HBV DNA PCR today.  -Labs reveal no dose-limiting toxicities.  Proceeded with cycle 1 of R-CHOP today.  He will return to clinic in 2 days for Neulasta injection.  -We will monitor his CD4 count periodically.  We will have to hold chemotherapy for CD4 count of less than 50.  -RTC in 1 week for toxicity evaluation.

## 2023-06-10 LAB — HEPATITIS B DNA, ULTRAQUANTITATIVE, PCR
HBV DNA SERPL PCR-ACNC: NOT DETECTED [IU]/mL
HBV DNA SERPL PCR-LOG IU: UNDETERMINED {Log}

## 2023-06-12 ENCOUNTER — Other Ambulatory Visit: Payer: Self-pay

## 2023-06-12 ENCOUNTER — Emergency Department (HOSPITAL_COMMUNITY)
Admission: EM | Admit: 2023-06-12 | Discharge: 2023-06-12 | Disposition: A | Discharge status: Home / Self Care | Payer: Medicare Other | Attending: Emergency Medicine | Admitting: Emergency Medicine

## 2023-06-12 ENCOUNTER — Telehealth: Payer: Self-pay

## 2023-06-12 ENCOUNTER — Encounter (HOSPITAL_COMMUNITY): Payer: Self-pay

## 2023-06-12 ENCOUNTER — Inpatient Hospital Stay: Payer: Medicare Other

## 2023-06-12 ENCOUNTER — Emergency Department (HOSPITAL_COMMUNITY): Payer: Medicare Other

## 2023-06-12 ENCOUNTER — Encounter: Payer: Self-pay | Admitting: *Deleted

## 2023-06-12 VITALS — BP 157/90 | HR 70 | Temp 98.0°F | Resp 18

## 2023-06-12 DIAGNOSIS — G893 Neoplasm related pain (acute) (chronic): Secondary | ICD-10-CM | POA: Diagnosis not present

## 2023-06-12 DIAGNOSIS — R519 Headache, unspecified: Secondary | ICD-10-CM | POA: Diagnosis present

## 2023-06-12 DIAGNOSIS — C119 Malignant neoplasm of nasopharynx, unspecified: Secondary | ICD-10-CM | POA: Diagnosis not present

## 2023-06-12 DIAGNOSIS — E860 Dehydration: Secondary | ICD-10-CM | POA: Insufficient documentation

## 2023-06-12 DIAGNOSIS — C8510 Unspecified B-cell lymphoma, unspecified site: Secondary | ICD-10-CM

## 2023-06-12 LAB — CBC WITH DIFFERENTIAL/PLATELET
Abs Immature Granulocytes: 0.02 10*3/uL (ref 0.00–0.07)
Basophils Absolute: 0 10*3/uL (ref 0.0–0.1)
Basophils Relative: 0 %
Eosinophils Absolute: 0 10*3/uL (ref 0.0–0.5)
Eosinophils Relative: 0 %
HCT: 32.1 % — ABNORMAL LOW (ref 39.0–52.0)
Hemoglobin: 10.3 g/dL — ABNORMAL LOW (ref 13.0–17.0)
Immature Granulocytes: 0 %
Lymphocytes Relative: 4 %
Lymphs Abs: 0.2 10*3/uL — ABNORMAL LOW (ref 0.7–4.0)
MCH: 32 pg (ref 26.0–34.0)
MCHC: 32.1 g/dL (ref 30.0–36.0)
MCV: 99.7 fL (ref 80.0–100.0)
Monocytes Absolute: 0 10*3/uL — ABNORMAL LOW (ref 0.1–1.0)
Monocytes Relative: 0 %
Neutro Abs: 4.7 10*3/uL (ref 1.7–7.7)
Neutrophils Relative %: 96 %
Platelets: 162 10*3/uL (ref 150–400)
RBC: 3.22 MIL/uL — ABNORMAL LOW (ref 4.22–5.81)
RDW: 13.2 % (ref 11.5–15.5)
WBC: 4.9 10*3/uL (ref 4.0–10.5)
nRBC: 0 % (ref 0.0–0.2)

## 2023-06-12 LAB — BASIC METABOLIC PANEL
Anion gap: 8 (ref 5–15)
BUN: 35 mg/dL — ABNORMAL HIGH (ref 8–23)
CO2: 24 mmol/L (ref 22–32)
Calcium: 8.7 mg/dL — ABNORMAL LOW (ref 8.9–10.3)
Chloride: 104 mmol/L (ref 98–111)
Creatinine, Ser: 0.92 mg/dL (ref 0.61–1.24)
GFR, Estimated: >60 mL/min (ref >60)
Glucose, Bld: 206 mg/dL — ABNORMAL HIGH (ref 70–99)
Potassium: 4.7 mmol/L (ref 3.5–5.1)
Sodium: 136 mmol/L (ref 135–145)

## 2023-06-12 MED ORDER — MORPHINE SULFATE 15 MG PO TABS
15.0000 mg | ORAL_TABLET | ORAL | Status: DC | PRN
  Administered 2023-06-12: 15 mg via ORAL
  Filled 2023-06-12: qty 1

## 2023-06-12 MED ORDER — MORPHINE SULFATE 15 MG PO TABS: 15.0000 mg | ORAL_TABLET | Freq: Four times a day (QID) | ORAL | 0 refills | Status: DC | PRN

## 2023-06-12 MED ORDER — IOHEXOL 300 MG/ML  SOLN
75.0000 mL | Freq: Once | INTRAMUSCULAR | Status: AC | PRN
  Administered 2023-06-12: 75 mL via INTRAVENOUS

## 2023-06-12 MED ORDER — PEGFILGRASTIM-FPGK 6 MG/0.6ML ~~LOC~~ SOSY
6.0000 mg | PREFILLED_SYRINGE | Freq: Once | SUBCUTANEOUS | Status: AC
  Administered 2023-06-12: 6 mg via SUBCUTANEOUS
  Filled 2023-06-12: qty 0.6

## 2023-06-12 MED ORDER — FENTANYL CITRATE PF 50 MCG/ML IJ SOSY
50.0000 ug | PREFILLED_SYRINGE | Freq: Once | INTRAMUSCULAR | Status: AC
  Administered 2023-06-12: 50 ug via INTRAVENOUS
  Filled 2023-06-12: qty 1

## 2023-06-12 NOTE — Telephone Encounter (Signed)
-----   Message from Belia Heman Comer sent at 06/12/2023  1:02 PM EST ----- He will be getting chemotherapy coming up and I think it will be ideal for him to switch to Biktarvy to get rid of the booster in Symtuza.   Can we get him in with pharmacy or someone? thanks

## 2023-06-12 NOTE — Progress Notes (Signed)
Patient presented to flush room for injection post treatment. Flush nurse alerted this RN that patient did not seem at baseline. Elevated BP, would not talk, and not at baseline. Patient guest stated he was also not acting like himself. He complained of jaw pain this morning.Marland Kitchen No provider available to assess patient at this time. Due to concern for high BP and jaw pain patient to be escorted to ED by nursing. Report called to ED charge and room held for patient.

## 2023-06-12 NOTE — Telephone Encounter (Signed)
Sure - I can discuss over the phone, no problem! I don't have time today but can probably reach out next Monday?

## 2023-06-12 NOTE — Progress Notes (Signed)
Pt here for stimufend injection appointment, upon arrival patients family member states he has not been his usual self today. Reports nausea, jaw pain, and weakness. Vitals indicate high BP which is not his baseline. Patient was very lethargic and pale. Dr Arlana Pouch is out for [facility] and our symptom management team is not here today. Went to two of the RN's that had him for his last treatment and they agreed it would be best if he went to the ED to get checked out. Sent to ED by wheelchair.

## 2023-06-12 NOTE — Telephone Encounter (Signed)
Spoke with [name] and his friend, discussed that Dr. Luciana Axe would like to potentially change his Symtuza. Attempted to schedule appointment with pharmacist or provider. Galvin only has assistance from his friend on MWF (for both transportation and coordinating appointments). He has many oncology/chemo appointments coming up and finding a time to come in is difficult.   Sandie Ano, RN

## 2023-06-12 NOTE — Telephone Encounter (Signed)
-----   Message from Nurse Threasa Beards sent at 06/09/2023  3:57 PM EST ----- Regarding: Dr Arlana Pouch Jordan Calderon, first time Doxorubicin, Vincristine, Cytoxan, Rituxan Dr Arlana Pouch Jordan Calderon came in 06/08/13 for first time Doxorubicin, Vincristine, Cytoxan, Rituxan. Tolerated infusions well. Needs call back.

## 2023-06-12 NOTE — ED Triage Notes (Signed)
Patient from cancer center c/o headache and jaw pain. Patient worsening pain today at cancer center. Patient had chemo done last Friday. Patient denies N/V.

## 2023-06-12 NOTE — ED Notes (Signed)
Patient d/c with home care instructions. VS obtained. IV discontinued. Wife at bedside

## 2023-06-12 NOTE — Telephone Encounter (Signed)
Mr. Jordan Calderon states that he is doing fine. He is eating, drinking, and urinating well. He knows to call the office at [phone] if he has any questions or concerns.

## 2023-06-12 NOTE — Discharge Instructions (Signed)
You were seen in the emergency room for severe jaw pain and headache.  We suspect that your pain is because of your cancer.  Please take morphine sulfate immediate release, ideally spaced out as long as possible, for pain control for now.  When you follow-up with the oncologist on Friday, please have conversation on long-term pain management.  Return to the emergency room if you start having any one-sided weakness, numbness, seizure-like activity, severe confusion.

## 2023-06-12 NOTE — ED Provider Notes (Signed)
[place] EMERGENCY DEPARTMENT AT [facility] Provider Note   CSN: [id number] Arrival date & time: 06/12/23  1504     History  Chief Complaint  Patient presents with   Jaw Pain   Headache    Jordan Calderon is a 62 y.o. male.  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  Patient was recently diagnosed with metastatic cancer and is undergoing chemotherapy and radiation.  He had gone today for postchemotherapy injection.  The nursing staff over there noted that patient was not himself, and noted that his blood pressure was high.  Patient complained of jaw pain and headache, therefore they advised that he come to the ER.  Patient's friend is at the bedside.  Patient indicates that he has been having headache and jaw pain for a long time.  However, over the last 2 days the symptoms are worse.  Pain is worse with any kind of swallowing or drinking.  Headache is generalized and constant.  He takes tramadol for pain control.  However over the last few days, the symptoms have been more severe.  The quality of pain in the location is the same as usual.  Patient's friend indicates that patient was quiet today when she picked him up, and he was quiet during the assessment by the oncology team as well, which was concerning to them.  She denies any blank stare.  Patient denies any loss of consciousness and has no memory issues, vision change, one-sided weakness, numbness, slurred speech, dizziness.  Home Medications Prior to Admission medications   Medication Sig Start Date End Date Taking? Authorizing Provider  acyclovir (ZOVIRAX) 400 MG tablet Take 1 tablet (400 mg total) by mouth daily. 06/02/23   Pasam, Archie Patten, MD  allopurinol (ZYLOPRIM) 300 MG tablet Take 1 tablet (300 mg total) by mouth daily. 06/02/23   Pasam, Archie Patten, MD  calcium carbonate (TUMS - DOSED IN MG ELEMENTAL CALCIUM) 500 MG chewable tablet Chew 1 tablet  by mouth 3 (three) times daily with meals.    [provider]  dapsone 100 MG tablet Take 1 tablet (100 mg total) by mouth daily. Patient taking differently: Take 100 mg by mouth daily with breakfast. 01/17/23   Comer, Belia Heman, MD  Darunavir-Cobicistat-Emtricitabine-Tenofovir Alafenamide [facility]) 800-150-200-10 MG TABS Take 1 tablet by mouth daily with breakfast. 01/17/23   Comer, Belia Heman, MD  fluconazole (DIFLUCAN) 200 MG tablet Take 1 tablet (200 mg total) by mouth daily. 05/25/23   Amin, Ameliarose Shark C, MD  ibuprofen (ADVIL) 200 MG tablet Take 200-400 mg by mouth every 6 (six) hours as needed for mild pain (pain score 1-3).    [provider]  lidocaine-prilocaine (EMLA) cream Apply to affected area once 06/02/23   Pasam, Avinash, MD  ondansetron (ZOFRAN) 8 MG tablet Take 1 tablet (8 mg total) by mouth every 8 (eight) hours as needed for nausea or vomiting. Start on the third day after cyclophosphamide chemotherapy. 06/02/23   Pasam, Archie Patten, MD  pantoprazole (PROTONIX) 40 MG tablet Take 1 tablet (40 mg total) by mouth 2 (two) times daily before a meal. 05/24/23   Amin, Arihant Pennings C, MD  predniSONE (DELTASONE) 20 MG tablet Take 5 tablets (100 mg total) by mouth daily. Take with food on days 1-5 of chemotherapy. 06/02/23   Pasam, Archie Patten, MD  prochlorperazine (COMPAZINE) 10 MG tablet Take 1 tablet (10 mg total) by mouth every 6 (six) hours as needed for nausea  or vomiting. 06/02/23   Pasam, Avinash, MD  rosuvastatin (CRESTOR) 10 MG tablet Take 1 tablet (10 mg total) by mouth daily. Patient not taking: Reported on 05/17/2023 04/19/23   Gardiner Barefoot, MD  traMADol (ULTRAM) 50 MG tablet Take 1 tablet (50 mg total) by mouth every 8 (eight) hours as needed. 06/02/23   Pasam, Archie Patten, MD      Allergies    Sulfonamide derivatives    Review of Systems   Review of Systems  All other systems reviewed and are negative.   Physical Exam Updated Vital Signs BP (!) 121/92   Pulse 65   Temp 97.9  F (36.6 C) (Oral)   Resp 13   Ht 5\' 9"  (1.753 m)   Wt 73.5 kg   SpO2 95%   BMI 23.93 kg/m  Physical Exam Vitals and nursing note reviewed.  Constitutional:      Appearance: He is well-developed.  HENT:     Head: Atraumatic.  Eyes:     Extraocular Movements: Extraocular movements intact.     Pupils: Pupils are equal, round, and reactive to light.  Cardiovascular:     Rate and Rhythm: Normal rate.  Pulmonary:     Effort: Pulmonary effort is normal.     Breath sounds: No stridor.     Comments: No drooling, trismus Musculoskeletal:     Cervical back: Neck supple.  Skin:    General: Skin is warm.  Neurological:     Mental Status: He is alert and oriented to person, place, and time.     GCS: GCS eye subscore is 4. GCS verbal subscore is 5. GCS motor subscore is 6.     Cranial Nerves: No cranial nerve deficit.     Sensory: No sensory deficit.     Motor: No weakness.     ED Results / Procedures / Treatments   Labs (all labs ordered are listed, but only abnormal results are displayed) Labs Reviewed  BASIC METABOLIC PANEL - Abnormal; Notable for the following components:      Result Value   Glucose, Bld 206 (*)    BUN 35 (*)    Calcium 8.7 (*)    All other components within normal limits  CBC WITH DIFFERENTIAL/PLATELET - Abnormal; Notable for the following components:   RBC 3.22 (*)    Hemoglobin 10.3 (*)    HCT 32.1 (*)    Lymphs Abs 0.2 (*)    Monocytes Absolute 0.0 (*)    All other components within normal limits    EKG None  Radiology No results found.  Procedures Procedures    Medications Ordered in ED Medications  fentaNYL (SUBLIMAZE) injection 50 mcg (50 mcg Intravenous Given 06/12/23 1544)  iohexol (OMNIPAQUE) 300 MG/ML solution 75 mL (75 mLs Intravenous Contrast Given 06/12/23 1659)    ED Course/ Medical Decision Making/ A&P                                 Medical Decision Making Amount and/or Complexity of Data Reviewed Labs:  ordered. Radiology: ordered.  Risk Prescription drug management.   This patient presents to the ED with chief complaint(s) of jaw pain, headache -and per oncology staff patient was not at his baseline self and had hypertension with pertinent past medical history of recently diagnosed nasopharyngeal cancer, currently on chemo/radiation.The complaint involves an extensive differential diagnosis and also carries with it a high risk of complications and morbidity.  The differential diagnosis includes : Metastatic cancer with complication such as bleed, edema, cancer related pain, worsening of the cancer, elevated intracranial pressure.  I reviewed patient's records including recent CT angiogram, CT soft tissue neck, MRI of the brain and MRA, and the PET scan from 1 week ago.  Clinically, it appears to me that patient most likely is having cancer related pain.  I do not suspect acute neurologic syndrome.  The initial plan is to get basic labs, consult with oncology to see if it is worthwhile getting imaging and follow-up recommendations.   Additional history obtained: Additional history obtained from  patient's friend who is at the bedside Records reviewed  oncology note from today and recent oncologic workup  I consulted oncology and spoke with Dr. Arbutus Ped.  He recommends that we either get CT head with and without contrast or MRI of the brain.  He thinks most likely the symptoms are because of cancer related pain and not this is early worsening of the tumor, especially in the setting of patient getting some treatment and his PET scan being only 54 week old.  He would like for [place] to focus on symptom management and have patient follow-up with his oncologist next week.  Independent labs interpretation:  The following labs were independently interpreted: Metabolic profile and CBC are reassuring with lab results that are at baseline normal for the patient  Independent visualization and  interpretation of imaging: - I independently visualized the following imaging with scope of interpretation limited to determining acute life threatening conditions related to emergency care: CT scan with and without contrast of the head, which revealed no evidence of brain bleed.  Awaiting radiology read to comment on the tumor itself.  Treatment and Reassessment: Patient reassessed after I had a consultation discussion with Dr. Arbutus Ped from oncology.  At that time he had already received something for pain and was in good spirits and smiling. His blood pressure is improved over time.  CT scan pending at this time, but do think that we will need to focus on pain management for now if the CT is negative.   7:26 PM Patient received morphine sulfate immediate release for his pain and he had significant improvement. He indicated that tramadol was not helping him at all. At this time, based on recommendation with the oncologist, I will start patient on MS IR.  I advised that he takes it every 8 hours if needed.  Will give him 15 mg MS IR, which is the lowest dose.  Patient has a follow-up appoint with oncology on Friday which is reassuring.  Stable for discharge.  Final Clinical Impression(s) / ED Diagnoses Final diagnoses:  Dehydration  Cancer associated pain    Rx / DC Orders ED Discharge Orders     None         Derwood Kaplan, MD 06/12/23 1930

## 2023-06-16 ENCOUNTER — Encounter: Payer: Self-pay | Admitting: Oncology

## 2023-06-16 ENCOUNTER — Inpatient Hospital Stay (HOSPITAL_BASED_OUTPATIENT_CLINIC_OR_DEPARTMENT_OTHER): Payer: Medicare Other | Admitting: Oncology

## 2023-06-16 ENCOUNTER — Inpatient Hospital Stay: Payer: Medicare Other

## 2023-06-16 ENCOUNTER — Other Ambulatory Visit: Payer: Self-pay

## 2023-06-16 VITALS — BP 115/76 | HR 95 | Temp 97.2°F | Resp 15 | Wt 166.5 lb

## 2023-06-16 DIAGNOSIS — T451X5A Adverse effect of antineoplastic and immunosuppressive drugs, initial encounter: Secondary | ICD-10-CM | POA: Insufficient documentation

## 2023-06-16 DIAGNOSIS — Z5111 Encounter for antineoplastic chemotherapy: Secondary | ICD-10-CM | POA: Diagnosis not present

## 2023-06-16 DIAGNOSIS — R768 Other specified abnormal immunological findings in serum: Secondary | ICD-10-CM

## 2023-06-16 DIAGNOSIS — C8518 Unspecified B-cell lymphoma, lymph nodes of multiple sites: Secondary | ICD-10-CM | POA: Diagnosis not present

## 2023-06-16 DIAGNOSIS — D701 Agranulocytosis secondary to cancer chemotherapy: Secondary | ICD-10-CM

## 2023-06-16 DIAGNOSIS — K7210 Chronic hepatic failure without coma: Secondary | ICD-10-CM

## 2023-06-16 DIAGNOSIS — C8510 Unspecified B-cell lymphoma, unspecified site: Secondary | ICD-10-CM

## 2023-06-16 DIAGNOSIS — B20 Human immunodeficiency virus [HIV] disease: Secondary | ICD-10-CM

## 2023-06-16 DIAGNOSIS — G893 Neoplasm related pain (acute) (chronic): Secondary | ICD-10-CM

## 2023-06-16 LAB — LACTATE DEHYDROGENASE: LDH: 103 U/L (ref 98–192)

## 2023-06-16 LAB — COMPREHENSIVE METABOLIC PANEL
ALT: 12 U/L (ref 0–44)
AST: 9 U/L — ABNORMAL LOW (ref 15–41)
Albumin: 3.9 g/dL (ref 3.5–5.0)
Alkaline Phosphatase: 69 U/L (ref 38–126)
Anion gap: 7 (ref 5–15)
BUN: 29 mg/dL — ABNORMAL HIGH (ref 8–23)
CO2: 28 mmol/L (ref 22–32)
Calcium: 9.5 mg/dL (ref 8.9–10.3)
Chloride: 100 mmol/L (ref 98–111)
Creatinine, Ser: 0.9 mg/dL (ref 0.61–1.24)
GFR, Estimated: >60 mL/min (ref >60)
Glucose, Bld: 97 mg/dL (ref 70–99)
Potassium: 4.5 mmol/L (ref 3.5–5.1)
Sodium: 135 mmol/L (ref 135–145)
Total Bilirubin: 0.6 mg/dL (ref <1.2)
Total Protein: 7.4 g/dL (ref 6.5–8.1)

## 2023-06-16 LAB — CBC WITH DIFFERENTIAL/PLATELET
Abs Immature Granulocytes: 0 10*3/uL (ref 0.00–0.07)
Basophils Absolute: 0 10*3/uL (ref 0.0–0.1)
Basophils Relative: 0 %
Eosinophils Absolute: 0 10*3/uL (ref 0.0–0.5)
Eosinophils Relative: 3 %
HCT: 34.3 % — ABNORMAL LOW (ref 39.0–52.0)
Hemoglobin: 11.6 g/dL — ABNORMAL LOW (ref 13.0–17.0)
Immature Granulocytes: 0 %
Lymphocytes Relative: 87 %
Lymphs Abs: 0.3 10*3/uL — ABNORMAL LOW (ref 0.7–4.0)
MCH: 31.6 pg (ref 26.0–34.0)
MCHC: 33.8 g/dL (ref 30.0–36.0)
MCV: 93.5 fL (ref 80.0–100.0)
Monocytes Absolute: 0 10*3/uL — ABNORMAL LOW (ref 0.1–1.0)
Monocytes Relative: 3 %
Neutro Abs: 0 10*3/uL — CL (ref 1.7–7.7)
Neutrophils Relative %: 7 %
Platelets: 108 10*3/uL — ABNORMAL LOW (ref 150–400)
RBC: 3.67 MIL/uL — ABNORMAL LOW (ref 4.22–5.81)
RDW: 13.1 % (ref 11.5–15.5)
Smear Review: NORMAL
WBC: 0.3 10*3/uL — CL (ref 4.0–10.5)
nRBC: 0 % (ref 0.0–0.2)

## 2023-06-16 LAB — PHOSPHORUS: Phosphorus: 3.8 mg/dL (ref 2.5–4.6)

## 2023-06-16 LAB — URIC ACID: Uric Acid, Serum: 2.6 mg/dL — ABNORMAL LOW (ref 3.7–8.6)

## 2023-06-16 LAB — HEPATITIS PANEL, ACUTE
HCV Ab: NONREACTIVE
Hep A IgM: NONREACTIVE
Hep B C IgM: NONREACTIVE
Hepatitis B Surface Ag: NONREACTIVE

## 2023-06-16 LAB — MAGNESIUM: Magnesium: 2 mg/dL (ref 1.7–2.4)

## 2023-06-16 NOTE — Assessment & Plan Note (Signed)
-   White count 300, ANC 0 today.  We expect white count to improve over the next 2 to 3 days, since he received Neulasta.  Advised neutropenic precautions.  He is on dapsone, Diflucan, acyclovir and Symtuza already  -Advised patient to avoid exposure to sick individuals due to lowered immunity.

## 2023-06-16 NOTE — Assessment & Plan Note (Addendum)
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 FISH testing positive for BCL6 and negative for Bcl-2 and C-MYC.  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 Associated cervical, upper abdominal, and mesenteric nodal implants. Osseous involvement in the left sacrum/pelvis.   -Today I discussed PET/CT findings with the patient, his sister and also showed them the images.  This will serve as a good baseline.  -As patient was complaining of recent onset headaches and tremors, we obtained CSF analysis to make sure there is no lymphoma involvement.  Underwent LP on 06/08/2023.  Cytology from CSF fluid was negative.  Imaging studies showed no gross abnormalities intracranially.  -Hepatitis B core antibody positive and surface antigen negative, likely from past infection.  Previously I discussed his case with his infectious disease doctor Dr. Luciana Axe.  Since patient is on Symtuza, he does not need additional treatment for this. HBV DNA PCR was negative on 06/09/2023.  -He started cycle 1 of R-CHOP on 06/09/2023.  Tolerated first cycle well and has not noticed any major side effects.  He did receive Neulasta on 06/12/2023.  -Labs today showed white count of 300, ANC 0.  Hemoglobin 11.6.  Platelet count 108,000.  We expect white count to improve over the next 2 to 3 days, since he received Neulasta.  Advised neutropenic precautions.  He is on dapsone, Diflucan, acyclovir and Symtuza already.  -We will monitor his CD4 count periodically.  We will have to hold chemotherapy for CD4 count of less than 50.  -RTC on 06/28/2023 for cycle 2 of chemotherapy.  Restaging PET scan will be obtained after completion of 3 cycles.

## 2023-06-16 NOTE — Assessment & Plan Note (Signed)
-   He does have a past history of hepatitis B infection  -Hepatitis B surface antigen is negative and core total antibody was positive.  It could be from his past infection.  HBV DNA PCR was negative on 06/09/2023.  -Since patient is already on Symtuza, there is low risk of reactivation and hence we proceeded with rituximab along with rest of the chemotherapy regimen

## 2023-06-16 NOTE — Progress Notes (Signed)
[facility]  ONCOLOGY CLINIC PROGRESS NOTE   Patient Care Team: Pcp, No as PCP - General Roderic Scarce, LCSW as Counselor Comer, Belia Heman, MD as Consulting Physician (Infectious Diseases) Meryl Crutch, MD as Consulting Physician (Hematology and Oncology)  PATIENT NAME: Jordan Calderon   MR#: [id number] DOB: 11-30-1960  Date of visit: 06/16/2023   ASSESSMENT & PLAN:   LESTER IMMING is a 62 y.o. gentleman with past medical history of HIV disease, depression, hypothyroidism, recent syncopal episode in November 2024 for which he was hospitalized from 05/11/2023 until 05/12/2023 with grossly negative TTE and CT head workup, was admitted to the hospital again on 05/17/2023 after he presented with dizziness, headaches.  CT angiogram of the head and neck on 05/17/2023 showed large right nasopharyngeal skull base mass which was suboptimally evaluated but was worrisome for nasopharyngeal carcinoma.  He was admitted for further evaluation and management.  Eventual workup revealed large B-cell lymphoma, high-grade, with multiple liver lesions and biopsies from stomach positive for non-Hodgkin lymphoma.  Stage IV disease.  FISH testing positive for BCL6 and negative for Bcl-2 and C-MYC.  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 FISH testing positive for BCL6 and negative for Bcl-2 and C-MYC.  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 Associated cervical, upper abdominal, and mesenteric nodal implants. Osseous involvement in the left sacrum/pelvis.   -Today I discussed PET/CT findings with the patient, his sister and also showed them the images.  This will serve as a good  baseline.  -As patient was complaining of recent onset headaches and tremors, we obtained CSF analysis to make sure there is no lymphoma involvement.  Underwent LP on 06/08/2023.  Cytology from CSF fluid was negative.  Imaging studies showed no gross abnormalities intracranially.  -Hepatitis B core antibody positive and surface antigen negative, likely from past infection.  Previously I discussed his case with his infectious disease doctor Dr. Luciana Axe.  Since patient is on Symtuza, he does not need additional treatment for this. HBV DNA PCR was negative on 06/09/2023.  -He started cycle 1 of R-CHOP on 06/09/2023.  Tolerated first cycle well and has not noticed any major side effects.  He did receive Neulasta on 06/12/2023.  -Labs today showed white count of 300, ANC 0.  Hemoglobin 11.6.  Platelet count 108,000.  We expect white count to improve over the next 2 to 3 days, since he received Neulasta.  Advised neutropenic precautions.  He is on dapsone, Diflucan, acyclovir and Symtuza already.  -We will monitor his CD4 count periodically.  We will have to hold chemotherapy for CD4 count of less than 50.  -RTC on 06/28/2023 for cycle 2 of chemotherapy.  Restaging PET scan will be obtained after completion of 3 cycles.  Chemotherapy induced neutropenia (HCC) - White count 300, ANC 0 today.  We expect white count to improve over the next 2 to 3 days, since he received Neulasta.  Advised neutropenic precautions.  He is on dapsone, Diflucan, acyclovir and Symtuza already  -Advised patient to avoid exposure to sick individuals due to lowered immunity.  Human immunodeficiency virus (HIV) disease (HCC) - Currently under reasonable control.  Has close follow-up with ID, Dr.Comer.  He is considering switching his HIV therapy to [facility].  Continue Symtuza for  now  -Patient's sister reported that they are expecting a phone call from Dr. Ephriam Knuckles office early next week regarding a follow-up appointment.  Advised  them to contact the office in case of no communication by mid-week.  Hepatitis B core antibody positive - He does have a past history of hepatitis B infection  -Hepatitis B surface antigen is negative and core total antibody was positive.  It could be from his past infection.  HBV DNA PCR was negative on 06/09/2023.  -Since patient is already on Symtuza, there is low risk of reactivation and hence we proceeded with rituximab along with rest of the chemotherapy regimen  Cancer associated pain Severe, persistent headache and facial pain, possibly related to nasopharyngeal mass. -Recently started him on tramadol and since pain was inadequately controlled, he was prescribed morphine.  Will monitor and titrate medications as needed.     I reviewed lab results and outside records for this visit and discussed relevant results with the patient. Diagnosis, plan of care and treatment options were also discussed in detail with the patient. Opportunity provided to ask questions and answers provided to his apparent satisfaction. Provided instructions to call our clinic with any problems, questions or concerns prior to return visit. I recommended to continue follow-up with PCP and sub-specialists. He verbalized understanding and agreed with the plan.   NCCN guidelines have been consulted in the planning of this patient's care.  I spent a total of 45 minutes during this encounter with the patient including review of chart and various tests results, discussions about plan of care and coordination of care plan.   Meryl Crutch, MD  06/16/2023 2:26 PM  [facility] - A DEPT OF [facility] [address] [place] [address] Dept: [phone] Dept Fax: [phone]    CHIEF COMPLAINT/ REASON FOR VISIT:   Recently diagnosed high-grade B-cell lymphoma, large cell type, stage IV disease with multiple liver lesions. FISH testing positive  for BCL6 and negative for Bcl-2 and C-MYC.  Current Treatment: R-CHOP started from 06/09/2023.  INTERVAL HISTORY:    Discussed the use of AI scribe software for clinical note transcription with the patient, who gave verbal consent to proceed.   He was accompanied by his sister today, who is very supportive.  His sister lives in [place], [place].    He reports headaches that are described as a throbbing pressure and are severe enough to disrupt sleep. The patient has been managing the pain with tramadol and morphine, but reports that these medications only dull the pain and do not eliminate it. The patient was recently in the ER due to the severity of the pain.  His sister reports that the patient has good and bad days, with some days spent mostly in bed due to the pain. The patient also reports some changes in ambulation, which may be related to involvement of the lymphoma in the hip bone.  I have reviewed the past medical history, past surgical history, social history and family history with the patient and they are unchanged from previous note.  HISTORY OF PRESENT ILLNESS:   62 y.o. gentleman with past medical history of HIV disease, depression, hypothyroidism, recent syncopal episode in November 2024 for which he was hospitalized from 05/11/2023 until 05/12/2023 with grossly negative TTE and CT head workup, was admitted to the hospital again on 05/17/2023 after he presented with dizziness, headaches.  CT angiogram of the head and neck on  05/17/2023 showed large right nasopharyngeal skull base mass which was suboptimally evaluated but was worrisome for nasopharyngeal carcinoma.  He was admitted for further evaluation and management.  Eventual workup revealed large B-cell lymphoma, high-grade, with multiple liver lesions and biopsies from stomach positive for non-Hodgkin lymphoma.  Stage IV disease.   ENT evaluated him and performed biopsy of right nasopharyngeal mass on 05/19/2023.  We were  consulted for any additional recommendations.   MR angiogram of the head without IV contrast on 05/11/2023 showed 3 mm outpouching extending posteriorly and laterally from the left ICA terminus, potentially reflecting a vascular infundibulum versus aneurysm.  Otherwise normal intracranial MRA.   CT soft tissue of the neck on 05/18/2023 showed large right nasopharyngeal mass that extended inferiorly to the level of the greater horn of the hyoid bone on the right.  This mass involves right-sided adenoid and palatine tonsils, right parapharyngeal space, right masticator space, right parotid space.  Superiorly this most extended to the level of foreman ovale bilaterally.  A 9 mm hypodense lesion in the left parotid gland was noted, unclear if necrotic lymph node versus intraparotid cyst.  On 05/19/2023, ENT performed excision of nasopharyngeal mass.  Pathology showed respiratory mucosa with chronic sinusitis and prominent secondary follicle/germinal center formation consistent with the presence of tonsillar type tissue.  Negative for malignancy.  On 05/20/2023, CT scan of the chest showed multiple liver lesions, concerning for metastatic disease, at least 15 lesions.  It also showed upper abdominal abnormal lymph nodes.  On 05/23/2023, CT abdomen and pelvis showed similar findings in the liver and gastrohepatic lymph nodes.  Focal wall thickening of the mid gastric lumen with also greater at the lesser curvature of stomach.  Left lung base lung nodule measuring 6 mm.  On 05/22/2023, GI performed upper endoscopy for further evaluation of anemia.  Esophageal mucosal changes classified as Barrett's stage C2-M3.  LA grade D esophagitis.  Esophageal plaques were found, consistent with candidiasis.  Nonbleeding gastric ulcer which was biopsied.  Pathology from stomach ulcer biopsy came back positive for non-Hodgkin's lymphoma, large cell type.  Esophageal biopsy showed Barrett's esophagus.  On 05/23/2023, he  underwent core needle biopsy of liver lesions.  Pathology showed high-grade B-cell lymphoma, large cell type.  Ki-67 was more than 50%. FISH testing positive for BCL6 and negative for Bcl-2 and C-MYC.  Staging PET/CT performed on 06/05/2023: 5.4 cm right nasopharyngeal mass, likely corresponding to the patient's known lymphoma.Multifocal pulmonary lymphoma.  Innumerable hepatic metastases. Associated cervical, upper abdominal, and mesenteric nodal implants. Osseous involvement in the left sacrum/pelvis.  Started chemotherapy with R-CHOP from 06/09/2023.  Oncology History  High grade B-cell lymphoma (HCC)  05/31/2023 Initial Diagnosis   DLBCL (diffuse large B cell lymphoma) (HCC)   06/05/2023 Cancer Staging   Staging form: Hodgkin and Non-Hodgkin Lymphoma, AJCC 8th Edition - Clinical: Stage IV (Diffuse large B-cell lymphoma) - Signed by Meryl Crutch, MD on 06/05/2023 Stage prefix: Initial diagnosis   06/09/2023 -  Chemotherapy   Patient is on Treatment Plan : NON-HODGKINS LYMPHOMA R-CHOP q21d         REVIEW OF SYSTEMS:   Review of Systems - Oncology  All other pertinent systems were reviewed with the patient and are negative.  ALLERGIES: He is allergic to sulfonamide derivatives.  MEDICATIONS:  Current Outpatient Medications  Medication Sig Dispense Refill   acyclovir (ZOVIRAX) 400 MG tablet Take 1 tablet (400 mg total) by mouth daily. 30 tablet 3   allopurinol (ZYLOPRIM) 300 MG tablet Take  1 tablet (300 mg total) by mouth daily. 30 tablet 3   calcium carbonate (TUMS - DOSED IN MG ELEMENTAL CALCIUM) 500 MG chewable tablet Chew 1 tablet by mouth 3 (three) times daily with meals.     dapsone 100 MG tablet Take 1 tablet (100 mg total) by mouth daily. (Patient taking differently: Take 100 mg by mouth daily with breakfast.) 30 tablet 5   Darunavir-Cobicistat-Emtricitabine-Tenofovir Alafenamide (SYMTUZA) 800-150-200-10 MG TABS Take 1 tablet by mouth daily with breakfast. 30 tablet 11    fluconazole (DIFLUCAN) 200 MG tablet Take 1 tablet (200 mg total) by mouth daily. 30 tablet 0   ibuprofen (ADVIL) 200 MG tablet Take 200-400 mg by mouth every 6 (six) hours as needed for mild pain (pain score 1-3).     lidocaine-prilocaine (EMLA) cream Apply to affected area once 30 g 3   morphine (MSIR) 15 MG tablet Take 1 tablet (15 mg total) by mouth every 6 (six) hours as needed for severe pain (pain score 7-10). 25 tablet 0   ondansetron (ZOFRAN) 8 MG tablet Take 1 tablet (8 mg total) by mouth every 8 (eight) hours as needed for nausea or vomiting. Start on the third day after cyclophosphamide chemotherapy. 30 tablet 1   pantoprazole (PROTONIX) 40 MG tablet Take 1 tablet (40 mg total) by mouth 2 (two) times daily before a meal. 60 tablet 0   predniSONE (DELTASONE) 20 MG tablet Take 5 tablets (100 mg total) by mouth daily. Take with food on days 1-5 of chemotherapy. 25 tablet 5   prochlorperazine (COMPAZINE) 10 MG tablet Take 1 tablet (10 mg total) by mouth every 6 (six) hours as needed for nausea or vomiting. 30 tablet 6   No current facility-administered medications for this visit.     VITALS:   Blood pressure 115/76, pulse 95, temperature (!) 97.2 F (36.2 C), temperature source Temporal, resp. rate 15, weight 166 lb 8 oz (75.5 kg), SpO2 98%.  Wt Readings from Last 3 Encounters:  06/16/23 166 lb 8 oz (75.5 kg)  06/12/23 162 lb 0.6 oz (73.5 kg)  06/06/23 162 lb (73.5 kg)    Body mass index is 24.59 kg/m.  Performance status (ECOG): 1 - Symptomatic but completely ambulatory  PHYSICAL EXAM:   Physical Exam Constitutional:      General: He is not in acute distress.    Appearance: Normal appearance.  HENT:     Head: Normocephalic and atraumatic.  Eyes:     General: No scleral icterus.    Conjunctiva/sclera: Conjunctivae normal.  Cardiovascular:     Rate and Rhythm: Normal rate and regular rhythm.     Heart sounds: Normal heart sounds.  Pulmonary:     Effort: Pulmonary  effort is normal.     Breath sounds: Normal breath sounds.  Abdominal:     General: There is no distension.  Musculoskeletal:     Right lower leg: No edema.     Left lower leg: No edema.  Lymphadenopathy:     Cervical: Cervical adenopathy present.  Neurological:     General: No focal deficit present.     Mental Status: He is alert and oriented to person, place, and time.  Psychiatric:     Comments: In good spirits today      LABORATORY DATA:   I have reviewed the data as listed.  Results for orders placed or performed in visit on 06/16/23  Hepatitis panel, acute  Result Value Ref Range   Hepatitis B Surface Ag NON  REACTIVE NON REACTIVE   HCV Ab NON REACTIVE NON REACTIVE   Hep A IgM NON REACTIVE NON REACTIVE   Hep B C IgM NON REACTIVE NON REACTIVE  Phosphorus  Result Value Ref Range   Phosphorus 3.8 2.5 - 4.6 mg/dL  Uric acid  Result Value Ref Range   Uric Acid, Serum 2.6 (L) 3.7 - 8.6 mg/dL  Magnesium  Result Value Ref Range   Magnesium 2.0 1.7 - 2.4 mg/dL  Lactate dehydrogenase  Result Value Ref Range   LDH 103 98 - 192 U/L  Comprehensive metabolic panel  Result Value Ref Range   Sodium 135 135 - 145 mmol/L   Potassium 4.5 3.5 - 5.1 mmol/L   Chloride 100 98 - 111 mmol/L   CO2 28 22 - 32 mmol/L   Glucose, Bld 97 70 - 99 mg/dL   BUN 29 (H) 8 - 23 mg/dL   Creatinine, Ser 4.09 0.61 - 1.24 mg/dL   Calcium 9.5 8.9 - 81.1 mg/dL   Total Protein 7.4 6.5 - 8.1 g/dL   Albumin 3.9 3.5 - 5.0 g/dL   AST 9 (L) 15 - 41 U/L   ALT 12 0 - 44 U/L   Alkaline Phosphatase 69 38 - 126 U/L   Total Bilirubin 0.6 <1.2 mg/dL   GFR, Estimated >91 >47 mL/min   Anion gap 7 5 - 15  CBC with Differential/Platelet  Result Value Ref Range   WBC 0.3 (LL) 4.0 - 10.5 K/uL   RBC 3.67 (L) 4.22 - 5.81 MIL/uL   Hemoglobin 11.6 (L) 13.0 - 17.0 g/dL   HCT 82.9 (L) 56.2 - 13.0 %   MCV 93.5 80.0 - 100.0 fL   MCH 31.6 26.0 - 34.0 pg   MCHC 33.8 30.0 - 36.0 g/dL   RDW 86.5 78.4 - 69.6 %    Platelets 108 (L) 150 - 400 K/uL   nRBC 0.0 0.0 - 0.2 %   Neutrophils Relative % 7 %   Neutro Abs 0.0 (LL) 1.7 - 7.7 K/uL   Lymphocytes Relative 87 %   Lymphs Abs 0.3 (L) 0.7 - 4.0 K/uL   Monocytes Relative 3 %   Monocytes Absolute 0.0 (L) 0.1 - 1.0 K/uL   Eosinophils Relative 3 %   Eosinophils Absolute 0.0 0.0 - 0.5 K/uL   Basophils Relative 0 %   Basophils Absolute 0.0 0.0 - 0.1 K/uL   WBC Morphology MORPHOLOGY UNREMARKABLE    RBC Morphology MORPHOLOGY UNREMARKABLE    Smear Review Normal platelet morphology    Immature Granulocytes 0 %   Abs Immature Granulocytes 0.00 0.00 - 0.07 K/uL     RADIOGRAPHIC STUDIES:  I have personally reviewed the radiological images as listed and agree with the findings in the report.  CT HEAD W & WO CONTRAST ( ) Result Date: 06/12/2023 CLINICAL DATA:  Head/neck cancer, monitor nasopharnyngeal mass, worsening headache and neck pain EXAM: CT HEAD WITHOUT AND WITH CONTRAST TECHNIQUE: Contiguous axial images were obtained from the base of the skull through the vertex without and with intravenous contrast. RADIATION DOSE REDUCTION: This exam was performed according to the departmental dose-optimization program which includes automated exposure control, adjustment of the mA and/or kV according to patient size and/or use of iterative reconstruction technique. CONTRAST:  75mL OMNIPAQUE IOHEXOL 300 MG/ML  SOLN COMPARISON:  CT head May 11, 2023. FINDINGS: Brain: No evidence of acute infarction, hemorrhage, hydrocephalus, extra-axial collection or mass lesion/mass effect. Vascular: No hyperdense vessel identified. Skull: No acute fracture. Sinuses/Orbits: Clear sinuses.  No acute orbital findings. Other: Known nasopharyngeal mass is only partially imaged on this study. IMPRESSION: 1. No evidence of acute intracranial abnormality. 2. Known nasopharyngeal mass is only partially imaged on this study. A CT of the neck with contrast could further characterize if  clinically warranted. Electronically Signed   By: Feliberto Harts M.D.   On: 06/12/2023 18:46   NM PET Image Initial (PI) Skull Base To Thigh Result Date: 06/11/2023 CLINICAL DATA:  Initial treatment strategy for DLBCL. EXAM: NUCLEAR MEDICINE PET SKULL BASE TO THIGH TECHNIQUE: 8.1 mCi F-18 FDG was injected intravenously. Full-ring PET imaging was performed from the skull base to thigh after the radiotracer. CT data was obtained and used for attenuation correction and anatomic localization. Fasting blood glucose: 90 mg/dl COMPARISON:  CTA chest dated 05/29/2023. CT abdomen/pelvis dated 05/23/2023. CT neck dated 05/18/2023. FINDINGS: Mediastinal blood pool activity: SUV max 2.2 Liver activity: SUV max NA NECK: 5.4 cm right nasopharyngeal mass (series 4/image 25), max SUV 37.0, likely corresponding the patient's known lymphoma. Small bilateral cervical nodes, including a 5 mm short axis left level 3 node (series 4/image 37), max SUV 11.6 Incidental CT findings: None. CHEST: Mild soft tissue thickening along a left upper lobe bronchus (series 7/image 27), max SUV 23.6. Additional 2.3 cm nodule in the medial left upper lobe/lingula (series 7/image 36), max SUV 22.2. Mild focal hypermetabolism in the right lower lobe, max SUV 6.7, possibly corresponding to a 7 mm subpleural nodule at the right lung base (series 7, image 81). No hypermetabolic thoracic lymphadenopathy. Incidental CT findings: Moderate coronary atherosclerosis of the LAD and left circumflex. ABDOMEN/PELVIS: Innumerable hypermetabolic hepatic lesions, including a dominant 5.3 cm lesion in segment 2 (series 4/image 106), max SUV 30.6. Masslike wall thickening along the posterior aspect of the gastric antrum (series 4/image 122), max SUV 32.2. 1.9 cm gastrohepatic node (series 4/image 111), max SUV 29.2. Additional 10 mm short axis portacaval node (series 4/image 115), max SUV 15.5. Scattered small mesenteric nodes versus peritoneal implants, including a  14 x 9 mm lesion along the left renal mesentery (series 4/image 145), max SUV 15.9. Incidental CT findings: Mild atherosclerotic calcifications of the abdominal aorta and branch vessels. SKELETON: Left sacral hypermetabolism, max SUV 7.7. Posterior left iliac bone hypermetabolism, max SUV 24.5. No CT correlate. Incidental CT findings: None. IMPRESSION: 5.4 cm right nasopharyngeal mass, likely corresponding to the patient's known lymphoma. Multifocal pulmonary lymphoma.  Innumerable hepatic metastases. Associated cervical, upper abdominal, and mesenteric nodal implants, as above. Osseous involvement in the left sacrum/pelvis. Electronically Signed   By: Charline Bills M.D.   On: 06/11/2023 21:01   DG FL GUIDED LUMBAR PUNCTURE Result Date: 06/08/2023 CLINICAL DATA:  Newly diagnosed high-grade B-cell lymphoma. Concern for CNS involvement. Request for diagnostic lumbar puncture EXAM: DIAGNOSTIC LUMBAR PUNCTURE UNDER FLUOROSCOPIC GUIDANCE FLUOROSCOPY TIME:  Radiation Exposure Index (as provided by the fluoroscopic device): 5.4 mGy If the device does not provide the exposure index: Fluoroscopy Time (in minutes and seconds):  0 minutes, 12 seconds Number of Acquired Images:  1 PROCEDURE: Informed consent was obtained from the patient prior to the procedure, including potential complications of headache, allergy, and pain. With the patient prone, the lower back was prepped with Betadine. 1% Lidocaine was used for local anesthesia. Lumbar puncture was performed at the L2-L3 level using a 20 gauge needle with return of clear colorless CSF with an opening pressure of 12 cm water. Approximately 10 ml of CSF were obtained for laboratory studies. The patient tolerated  the procedure well and there were no apparent complications. IMPRESSION: Technically successful lumbar puncture from L2-L3 level without complication. Procedure performed by Brayton El PA-C supervised by Dr. Marliss Coots Electronically Signed   By: Marliss Coots M.D.   On: 06/08/2023 14:31   IR IMAGING GUIDED PORT INSERTION Result Date: 06/06/2023 INDICATION: 62 year old male referred for port catheter EXAM: IMAGE GUIDED PORT CATHETER MEDICATIONS: None ANESTHESIA/SEDATION: Moderate (conscious) sedation was employed during this procedure. A total of Versed 2.0 mg and Fentanyl 100 mcg was administered intravenously. Moderate Sedation Time: 20 minutes. The patient's level of consciousness and vital signs were monitored continuously by radiology nursing throughout the procedure under my direct supervision. FLUOROSCOPY TIME:  Fluoroscopy Time:   (1 mGy). COMPLICATIONS: None PROCEDURE: Informed written consent was obtained from the patient after a discussion of the risks, benefits, and alternatives to treatment. Questions regarding the procedure were encouraged and answered. The right neck and chest were prepped with chlorhexidine in a sterile fashion, and a sterile drape was applied covering the operative field. Maximum barrier sterile technique with sterile gowns and gloves were used for the procedure. A timeout was performed prior to the initiation of the procedure. Ultrasound survey was performed with images stored and sent to PACs. Right IJ vein documented to be patent. The right neck and chest was prepped with chlorhexidine, and draped in the usual sterile fashion using maximum barrier technique (cap and mask, sterile gown, sterile gloves, large sterile sheet, hand hygiene and cutaneous antiseptic). Local anesthesia was attained by infiltration with 1% lidocaine without epinephrine. Ultrasound demonstrated patency of the right internal jugular vein, and this was documented with an image. Under real-time ultrasound guidance, this vein was accessed with a 21 gauge micropuncture needle and image documentation was performed. A small dermatotomy was made at the access site with an 11 scalpel. A 0.018" wire was advanced into the SVC and used to estimate the length of  the internal catheter. The access needle exchanged for a 66F micropuncture vascular sheath. The 0.018" wire was then removed and a 0.035" wire advanced into the IVC. An appropriate location for the subcutaneous reservoir was selected below the clavicle and an incision was made through the skin and underlying soft tissues. The subcutaneous tissues were then dissected using a combination of blunt and sharp surgical technique and a pocket was formed. A single lumen power injectable portacatheter was then tunneled through the subcutaneous tissues from the pocket to the dermatotomy and the port reservoir placed within the subcutaneous pocket. The venous access site was then serially dilated and a peel away vascular sheath placed over the wire. The wire was removed and the port catheter advanced into position under fluoroscopic guidance. The catheter tip is positioned in the cavoatrial junction. This was documented with a spot image. The portacatheter was then tested and found to flush and aspirate well. The port was flushed with saline followed by 100 units/mL heparinized saline. The pocket was then closed in two layers using first subdermal inverted interrupted absorbable sutures followed by a running subcuticular suture. The epidermis was then sealed with Dermabond. The dermatotomy at the venous access site was also seal with Dermabond. Patient tolerated the procedure well and remained hemodynamically stable throughout. No complications encountered and no significant blood loss encountered IMPRESSION: Status post right IJ port catheter Signed, Yvone Neu. Miachel Roux, RPVI [facility] Radiology Electronically Signed   By: Gilmer Mor D.O.   On: 06/06/2023 14:30   CT  Angio Chest PE W and/or Wo Contrast Result Date: 05/29/2023 CLINICAL DATA:  Septic arterial embolism. EXAM: CT ANGIOGRAPHY CHEST WITH CONTRAST TECHNIQUE: Multidetector CT imaging of the chest was  performed using the standard protocol during bolus administration of intravenous contrast. Multiplanar CT image reconstructions and MIPs were obtained to evaluate the vascular anatomy. RADIATION DOSE REDUCTION: This exam was performed according to the departmental dose-optimization program which includes automated exposure control, adjustment of the mA and/or kV according to patient size and/or use of iterative reconstruction technique. CONTRAST:  75mL OMNIPAQUE IOHEXOL 350 MG/ML SOLN COMPARISON:  05/20/2023 chest CT. FINDINGS: Cardiovascular: Satisfactory opacification of the pulmonary arteries to the segmental level. No evidence of pulmonary embolism. Normal heart size. No pericardial effusion. 3.8 cm diameter ascending aorta. Aortic atherosclerosis. Mediastinum/Nodes: No worrisome lymph node. Lungs/Pleura: Generalized airway thickening since prior. There is airway thickening that is focally marked in the posterior segment of the left upper lobe, largely acute when compared to recent prior. Patchy areas of ground-glass density in the bilateral lungs, mild. Branching density over the right diaphragm and nodular density over the left diaphragm, a pre-existing nodule measuring approximately 1 cm, three-month follow-up already recommended in the setting of active malignancy. Paraseptal emphysema at the apices. Upper Abdomen: Known hepatic metastatic disease.  No acute finding Musculoskeletal: No emergent finding. Review of the MIP images confirms the above findings. IMPRESSION: 1. Bronchitis with patchy areas of mild bronchopneumonia. 2. Known malignancy with pre-existing pulmonary nodule and recommended follow-up. 3. Atherosclerosis including the coronary arteries. Electronically Signed   By: Tiburcio Pea M.D.   On: 05/29/2023 04:40   DG Chest Portable 1 View Result Date: 05/29/2023 CLINICAL DATA:  Shortness of breath EXAM: PORTABLE CHEST 1 VIEW COMPARISON:  05/17/2023 FINDINGS: Heart and mediastinal contours  are within normal limits. No focal opacities or effusions. No acute bony abnormality. IMPRESSION: No active disease. Electronically Signed   By: Charlett Nose M.D.   On: 05/29/2023 03:39   DG Swallowing Func-Speech Pathology Result Date: 05/24/2023 Table formatting from the original result was not included. Modified Barium Swallow Study Patient Details Name: TRAVONN ALEXIS MRN: [id number] Date of Birth: May 19, 1961 Today's Date: 05/24/2023 HPI/PMH: HPI: Dillen Grayer is a 62 yo male who presented [facility] on 11/27 with lightheadedness, R ear pain, R ptosis, HA, cough, n/v. 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 CT NECK FINDINGS:  Pharynx and larynx: There is a large right nasopharyngeal mass that  extends inferiorly to the level the greater horn of the hyoid bone  on right. This mass involves the right-sided adenoid and palatine  tonsils, the right parapharyngeal space, the right masticator space,  the right parotid space. Superiorly this mass likely extends to the  level of the foramen ovale bilaterally.  CT CHEST showed liver lesions concerning for mets; patulous esophagus. Results of biopsies pending. EGD 12/2: gastric ulcer, severe esophagitis, candidiasis. ENT and oncology following. PMH includes HIV on Hart therapy, HLD, depression, anxiety, AKI. Clinical Impression: Pt demonstrates mild oropharyngeal dysphagia characterized by mild residue in the vallecula and pyriform sinuses. There is appearance of mildly decreased hyoid excursion but very good laryngeal elevation. Pt may have decreased base of tongue retraction and decreased pharyngeal contraction that lead to pharyngeal residuals. The pt clears with an effortful swallow. Residue most significant with thin liquids. There was one instance of trace penetration post swallow with wet vocal quality and throat clear ejecting. Pt recommended to continue foods and drinks of choice.  Suggested effortful  swallows as a way to clear residue, but also to use as part of an initial home exercise program for pre radiation treatment (potential). Introduced concepts of radiation fibrosis, dysphagia and the importance of continueing swallowing. Pt not yet ready to absorb this and will need further education. Pt to f/u with OP SLP for further education and HEP. Factors that may increase risk of adverse event in presence of aspiration Rubye Oaks [employer] Clearance Coots 2021): No data recorded Recommendations/Plan: Swallowing Evaluation Recommendations Swallowing Evaluation Recommendations Recommendations: PO diet PO Diet Recommendation: Regular; Thin liquids (Level 0) Liquid Administration via: Cup; Straw Medication Administration: Whole meds with liquid Supervision: Patient able to self-feed Swallowing strategies  : effortful swallow Postural changes: Stay upright 30-60 min after meals Oral care recommendations: Oral care BID (2x/day) Treatment Plan Treatment Plan Treatment recommendations: Defer treatment plan to SLP at other venue (see follow-up recommendations) Follow-up recommendations: Outpatient SLP Recommendations Recommendations for follow up therapy are one component of a multi-disciplinary discharge planning process, led by the attending physician.  Recommendations may be updated based on patient status, additional functional criteria and insurance authorization. Assessment: Orofacial Exam: Orofacial Exam Oral Cavity - Dentition: Adequate natural dentition Anatomy: No data recorded Boluses Administered: Boluses Administered Boluses Administered: Thin liquids (Level 0); Mildly thick liquids (Level 2, nectar thick); Moderately thick liquids (Level 3, honey thick); Puree; Solid  Oral Impairment Domain: Oral Impairment Domain Lip Closure: No labial escape Tongue control during bolus hold: Cohesive bolus between tongue to palatal seal Bolus preparation/mastication: Timely and efficient chewing and mashing Bolus transport/lingual motion:  Brisk tongue motion Oral residue: Complete oral clearance Location of oral residue : N/A Initiation of pharyngeal swallow : Posterior laryngeal surface of the epiglottis  Pharyngeal Impairment Domain: Pharyngeal Impairment Domain Soft palate elevation: No bolus between soft palate (SP)/pharyngeal wall (PW) Laryngeal elevation: Complete superior movement of thyroid cartilage with complete approximation of arytenoids to epiglottic petiole Anterior hyoid excursion: Partial anterior movement Epiglottic movement: Complete inversion Laryngeal vestibule closure: Complete, no air/contrast in laryngeal vestibule Pharyngeal stripping wave : Present - diminished Pharyngeal contraction (A/P view only): N/A Pharyngoesophageal segment opening: Complete distension and complete duration, no obstruction of flow Tongue base retraction: Trace column of contrast or air between tongue base and PPW Pharyngeal residue: Trace residue within or on pharyngeal structures; Collection of residue within or on pharyngeal structures Location of pharyngeal residue: Valleculae; Pyriform sinuses  Esophageal Impairment Domain: Esophageal Impairment Domain Esophageal clearance upright position: Esophageal retention Pill: Pill Consistency administered: Thin liquids (Level 0) Penetration/Aspiration Scale Score: No data recorded Compensatory Strategies: Compensatory Strategies Compensatory strategies: Yes Effortful swallow: Effective Chin tuck: Ineffective   General Information: Caregiver present: No  Diet Prior to this Study: Regular; Thin liquids (Level 0)   Temperature : Normal   Respiratory Status: WFL   Supplemental O2: None (Room air)   No data recorded Behavior/Cognition: Alert; Cooperative Self-Feeding Abilities: Able to self-feed Baseline vocal quality/speech: Dysphonic Volitional Cough: Able to elicit Volitional Swallow: Able to elicit No data recorded Goal Planning: No data recorded No data recorded No data recorded No data recorded No data  recorded Pain: No data recorded End of Session: Start Time:SLP Start Time (ACUTE ONLY): 1115 Stop Time: SLP Stop Time (ACUTE ONLY): 1136 Time Calculation:SLP Time Calculation (min) (ACUTE ONLY): 21 min Charges: SLP Evaluations $ SLP Speech Visit: 1 Visit SLP Evaluations $BSS Swallow: 1 Procedure $MBS Swallow: 1 Procedure $Swallowing Treatment: 1 Procedure SLP visit diagnosis: SLP Visit Diagnosis: Dysphagia, oropharyngeal phase (R13.12) Past Medical  History: Past Medical History: Diagnosis Date  Anxiety   Cellulitis 02/11/2014  LEFT ARM  Depression   Headache(784.0)   HIV (human immunodeficiency virus infection) (HCC)  Past Surgical History: Past Surgical History: Procedure Laterality Date  ARM HARDWARE REMOVAL Left   arm surgery    HEMORRHOID SURGERY    NASOPHARYNGEAL BIOPSY N/A 05/19/2023  Procedure: NASOPHARYNGEAL BIOPSY;  Surgeon: Newman Pies, MD;  Location: [facility] OR;  Service: ENT;  Laterality: N/A; DeBlois, Riley Nearing 05/24/2023, 12:22 PM  CT ABDOMEN PELVIS W CONTRAST Result Date: 05/23/2023 CLINICAL DATA:  Abnormal neck and chest CT, nasopharyngeal mass EXAM: CT ABDOMEN AND PELVIS WITH CONTRAST TECHNIQUE: Multidetector CT imaging of the abdomen and pelvis was performed using the standard protocol following bolus administration of intravenous contrast. RADIATION DOSE REDUCTION: This exam was performed according to the departmental dose-optimization program which includes automated exposure control, adjustment of the mA and/or kV according to patient size and/or use of iterative reconstruction technique. CONTRAST:  75mL OMNIPAQUE IOHEXOL 350 MG/ML SOLN COMPARISON:  CT 05/20/2023 FINDINGS: Lower chest: See separately dictated recently performed chest CT. Left lung base pulmonary nodule measuring 6 mm on series 4, image 21. No acute airspace disease. Hepatobiliary: Multiple hypodense liver masses concerning for metastatic disease. Index right hepatic lobe mass lesion measures 2.8 x 2.3 cm on series 3, image 22.  Index left hepatic lobe lesion measures 2.4 x 2.6 cm on series 3, image 31. Numerous additional hypodense liver masses. No calcified gallstone or biliary dilatation. Pancreas: Unremarkable. No pancreatic ductal dilatation or surrounding inflammatory changes. Spleen: Normal in size without focal abnormality. Adrenals/Urinary Tract: Adrenal glands are normal. Kidneys show no hydronephrosis. The bladder is unremarkable Stomach/Bowel: Stomach nonenlarged. Focal wall thickening of the mid gastric lumen, series 3, image 36 and coronal series 6, image 77. On sagittal views, ulcer crater at the lesser curvature of stomach, series 7, image 87 and 88. No small bowel distension. No acute bowel wall thickening. Vascular/Lymphatic: Nonaneurysmal aorta with mild atherosclerosis. Mildly enlarged gastrohepatic lymph nodes measuring 14 x 14 mm on series 3, image 27. Negative for retroperitoneal or pelvic adenopathy. Reproductive: Prostate is unremarkable. Other: Negative for pelvic effusion or free air for small fat containing inguinal hernias. Musculoskeletal: No acute or suspicious osseous abnormality. IMPRESSION: 1. Multiple hypodense liver masses concerning for metastatic disease. Mildly enlarged gastrohepatic lymph nodes also suspicious for metastatic disease. 2. Focal wall thickening of the mid gastric lumen with ulcer crater at the lesser curvature of stomach. Correlate with endoscopy if not already performed. 3. Left lung base pulmonary nodule measuring 6 mm. See separately dictated recently performed chest CT. 4. Aortic atherosclerosis. Aortic Atherosclerosis (ICD10-I70.0). Electronically Signed   By: Jasmine Pang M.D.   On: 05/23/2023 17:52   US BIOPSY (LIVER) Result Date: 05/23/2023 INDICATION: 62 year old with multiple liver lesions. Findings are suggestive for metastatic disease. Diagnosis is needed. EXAM: ULTRASOUND-GUIDED LIVER LESION BIOPSY MEDICATIONS: Moderate sedation ANESTHESIA/SEDATION: Moderate (conscious)  sedation was employed during this procedure. A total of Versed 1 mg and Fentanyl 25 mcg was administered intravenously by the radiology nurse. Total intra-service moderate Sedation Time: 18 minutes. The patient's level of consciousness and vital signs were monitored continuously by radiology nursing throughout the procedure under my direct supervision. FLUOROSCOPY TIME:  None COMPLICATIONS: None immediate. PROCEDURE: Informed written consent was obtained from the patient after a thorough discussion of the procedural risks, benefits and alternatives. All questions were addressed. Maximal Sterile Barrier Technique was utilized including caps, mask, sterile gowns, sterile gloves, sterile drape, hand  hygiene and skin antiseptic. A timeout was performed prior to the initiation of the procedure. Abdomen was evaluated with ultrasound. A right hepatic lesion was targeted. The right side of the abdomen was prepped with chlorhexidine and sterile field was created. Skin was anesthetized using 1% lidocaine. Small incision was made. Using ultrasound guidance, 17 gauge coaxial needle was directed into a right hepatic lesion. Total of 3 core biopsies were performed with an 18 gauge core device. Three adequate specimens obtained and placed in formalin. 17 gauge coaxial needle was removed without complication. Bandage placed over the puncture site. FINDINGS: Several hyperechoic lesions scattered throughout the liver. A lesion in the right hepatic lobe was targeted. Biopsy needle confirmed within the lesion. Three adequate specimens obtained. No immediate bleeding or hematoma formation. IMPRESSION: Successful ultrasound-guided core biopsies from a right hepatic lesion. Electronically Signed   By: Richarda Overlie M.D.   On: 05/23/2023 10:22   CT CHEST W CONTRAST Result Date: 05/20/2023 CLINICAL DATA:  Staging colon cancer. New nasopharyngeal mass. * Tracking Code: BO * EXAM: CT CHEST WITH CONTRAST TECHNIQUE: Multidetector CT imaging of  the chest was performed during intravenous contrast administration. RADIATION DOSE REDUCTION: This exam was performed according to the departmental dose-optimization program which includes automated exposure control, adjustment of the mA and/or kV according to patient size and/or use of iterative reconstruction technique. CONTRAST:  50mL OMNIPAQUE IOHEXOL 350 MG/ML SOLN COMPARISON:  None Available. FINDINGS: Cardiovascular: Heart is nonenlarged. Coronary artery calcifications are seen. Please correlate for other coronary risk factors. Diameter of the ascending aorta measures 4.4 by 4.4 cm at the level of the right pulmonary artery. More normal caliber arch and descending thoracic aorta. Mild vascular calcifications along the aorta. Mediastinum/Nodes: Preserved thyroid gland. Slightly patulous thoracic esophagus with some luminal air and fluid. No specific abnormal lymph node enlargement present in the axillary region, hilum or mediastinum. A few small less than 1 cm size mediastinal and right hilar nodes are seen, nonpathologic by size criteria. Lungs/Pleura: No consolidation, pneumothorax or effusion. Slight breathing motion. Left lower lobe lung nodule identified measuring 8 by 3 mm on series 4, image 136. Similar focus in the right lung base measuring 7 mm on series 4 image 123. Few other tiny nodules in the right lung such as series 4, image 119 in the lower lobe. Few patchy areas of ground-glass type areas are seen as well. Apical pleural thickening identified with some paraseptal and centrilobular emphysematous changes. Some areas of interstitial septal thickening as well. Upper Abdomen: Multiple liver masses are identified. At least 15 lesions are seen. Example in segment 2 on series 3, image 147 measures 2.7 by 2.6 cm. Middle lobe lesion on series 3, image 174 measures 2.7 by 2.7 cm. Adrenal glands are preserved. There also abnormal nodes identified such as lesser curve of the stomach measuring 16 x 20 mm.  Musculoskeletal: Mild degenerative changes along the spine. IMPRESSION: Numerous liver metastases. Upper abdominal abnormal nodes. Further workup of the abdomen and pelvis may be useful when clinically appropriate if there is no known previous. Patulous esophagus with slight wall thickening. Please correlate with symptoms and additional evaluation when clinically appropriate. Small mediastinal lymph nodes.  Attention on follow-up. Small lung nodules measuring up to 8 mm. Recommend follow up in 3 months or as per the patient's primary neoplasm. Coronary artery calcifications. In addition dilatation of the ascending aorta. Recommend annual imaging followup by CTA or MRA. This recommendation follows 2010 ACCF/AHA/AATS/ACR/ASA/SCA/SCAI/SIR/STS/SVM Guidelines for the Diagnosis and Management of  Patients with Thoracic Aortic Disease. Circulation. 2010; 121: N562-Z308. Aortic aneurysm NOS (ICD10-I71.9) Aortic Atherosclerosis (ICD10-I70.0) and Emphysema (ICD10-J43.9). Electronically Signed   By: Karen Kays M.D.   On: 05/20/2023 14:09   CT SOFT TISSUE NECK W CONTRAST Result Date: 05/18/2023 CLINICAL DATA:  Neck mass EXAM: CT NECK WITH CONTRAST TECHNIQUE: Multidetector CT imaging of the neck was performed using the standard protocol following the bolus administration of intravenous contrast. RADIATION DOSE REDUCTION: This exam was performed according to the departmental dose-optimization program which includes automated exposure control, adjustment of the mA and/or kV according to patient size and/or use of iterative reconstruction technique. CONTRAST:  75mL OMNIPAQUE IOHEXOL 350 MG/ML SOLN COMPARISON:  CTA head/neck 05/17/23 FINDINGS: Pharynx and larynx: There is a large right nasopharyngeal mass that extends inferiorly to the level the greater horn of the hyoid bone on right. This mass involves the right-sided adenoid and palatine tonsils, the right parapharyngeal space, the right masticator space, the right parotid  space. Superiorly this mass likely extends to the level of the foramen ovale bilaterally. Salivary glands: The lateral extension of the large nasopharyngeal mass abuts the right parotid gland in the superior aspect of the right submandibular gland. There is a 9 mm hypodense lesion in the left parotid gland. Thyroid: Normal. Lymph nodes: There is a 9 mm hypodense lesion in the left parotid gland (series 2, image 46). It is unclear if this represents a necrotic lymph node or an intraparotid cyst. Vascular: Negative. Limited intracranial: Negative. Visualized orbits: Negative. Mastoids and visualized paranasal sinuses: No middle ear or mastoid effusion. Paranasal sinuses are clear. Skeleton: No acute or aggressive process. Upper chest: Negative. Other: None IMPRESSION: 1. Large right nasopharyngeal mass that extends inferiorly to the level the greater horn of the hyoid bone on the right. This mass involves the right-sided adenoid and palatine tonsils, the right parapharyngeal space, the right masticator space, the right parotid space. Superiorly this mass likely extends to the level of the foramen ovale bilaterally. 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 Recommend ENT consultation. 2. A 9 mm hypodense lesion in the left parotid gland. It is unclear if this represents a necrotic lymph node or an intraparotid cyst. Electronically Signed   By: Lorenza Cambridge M.D.   On: 05/18/2023 11:31   MR ANGIO HEAD WO CONTRAST Result Date: 05/17/2023 CLINICAL DATA:  Initial evaluation for persistent dizziness. EXAM: MRA HEAD WITHOUT CONTRAST TECHNIQUE: Angiographic images of the Circle of Willis were acquired using MRA technique without intravenous contrast. COMPARISON:  CTA from earlier the same day. FINDINGS: Anterior circulation: Both internal carotid arteries are widely patent through the siphons without stenosis or other abnormality. Again seen is a 3 mm outpouching extending posteriorly and laterally  from the left ICA terminus (series 2, image 93). Possible small vessel emanating from its apex, not entirely certain. Finding could reflect an vascular infundibulum versus aneurysm. A1 segments patent bilaterally, with the right dominant. Normal anterior communicating artery complex. Anterior cerebral arteries patent without stenosis. No M1 stenosis or occlusion. Distal MCA branches perfused and symmetric. Posterior circulation: Visualized V4 segments patent without stenosis. Right PICA patent. Left PICA origin not seen. Basilar patent without stenosis. Superior cerebellar and posterior cerebral arteries widely patent bilaterally. Anatomic variants: As above. Other: Previously identified mass at the right skull base noted, partially visualized, better evaluated on prior CT. IMPRESSION: 1. 3 mm outpouching extending posteriorly and laterally from the left ICA terminus, potentially reflecting an vascular infundibulum versus aneurysm. Attention at follow-up  recommended. 2. Otherwise normal intracranial MRA. No large vessel occlusion or other emergent finding. Electronically Signed   By: Rise Mu M.D.   On: 05/17/2023 20:58    CODE STATUS:  Code Status History     Date Active Date Inactive Code Status Order ID Comments User Context   05/29/2023 0850 05/30/2023 2252 Full Code [id number]  Maryln Gottron, MD ED   05/17/2023 1906 05/24/2023 1834 Full Code [id number]  Rometta Emery, MD Inpatient   05/17/2023 1532 05/17/2023 1906 Full Code [id number]  Nolberto Hanlon, MD ED   05/11/2023 2024 05/12/2023 2308 Full Code [id number]  Hillary Bow, DO Inpatient   07/01/2018 1624 07/03/2018 1537 Full Code [id number]  Dorcas Carrow, MD Inpatient   02/10/2014 2054 02/12/2014 0031 Full Code [id number]  Christen Bame, MD Inpatient    Questions for Most Recent Historical Code Status (Order [id number])     Question Answer   By: Consent: discussion documented in EHR            No orders of the defined types  were placed in this encounter.    Future Appointments  Date Time Provider Department Center  06/28/2023  9:15 AM [facility] FLUSH [facility] None  06/28/2023  9:45 AM Jenie Parish, Archie Patten, MD [facility] None  06/28/2023 10:30 AM [facility] INFUSION [facility] None  06/30/2023  2:15 PM [facility] FLUSH [facility] None  08/23/2023  9:30 AM Comer, Belia Heman, MD [facility]  08/30/2023 10:10 AM Armbruster, [name], MD [facility]      This document was completed utilizing speech recognition software. Grammatical errors, random word insertions, pronoun errors, and incomplete sentences are an occasional consequence of this system due to software limitations, ambient noise, and hardware issues. Any formal questions or concerns about the content, text or information contained within the body of this dictation should be directly addressed to the provider for clarification.

## 2023-06-16 NOTE — Assessment & Plan Note (Signed)
-   Currently under reasonable control.  Has close follow-up with ID, Dr.Comer.  He is considering switching his HIV therapy to [facility].  Continue Symtuza for now  -Patient's sister reported that they are expecting a phone call from Dr. Ephriam Knuckles office early next week regarding a follow-up appointment.  Advised them to contact the office in case of no communication by mid-week.

## 2023-06-16 NOTE — Assessment & Plan Note (Signed)
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 -Recently started him on tramadol and since pain was inadequately controlled, he was prescribed morphine.  Will monitor and titrate medications as needed.

## 2023-06-16 NOTE — Progress Notes (Signed)
CRITICAL VALUE STICKER  CRITICAL VALUE: WBC 0.3, Preliminary ANC 0.0  RECEIVER (on-site recipient of call): Morrie Sheldon   DATE & TIME NOTIFIED: 12/27 @ 0924  MESSENGER (representative from lab): Lanora Manis   MD NOTIFIED: Dr. Arlana Pouch   TIME OF NOTIFICATION: [phone]  RESPONSE: Dr. Verlan Friends notified

## 2023-06-19 ENCOUNTER — Other Ambulatory Visit: Payer: Self-pay | Admitting: Pharmacist

## 2023-06-19 DIAGNOSIS — B20 Human immunodeficiency virus [HIV] disease: Secondary | ICD-10-CM

## 2023-06-19 MED ORDER — BIKTARVY 50-200-25 MG PO TABS: 1.0000 | ORAL_TABLET | Freq: Every day | ORAL | 11 refills | Status: DC

## 2023-06-19 NOTE — Telephone Encounter (Signed)
Called and spoke to [name]. Explained the switch from [place] to [place]. Counseled on administration and potential for any side effects. No drug interactions with his medications. Will send script to [facility] for him. Asked him to reach out if he has any issues or concerns. Made him a lab appointment on 07/21/23 for a viral load check. He will call me with any questions.  Sidhant Helderman L. Janecia Palau, PharmD, BCIDP, AAHIVP, CPP Clinical Pharmacist Practitioner Infectious Diseases Clinical Pharmacist [facility] 06/19/2023, 1:47 PM

## 2023-06-20 ENCOUNTER — Other Ambulatory Visit: Payer: Self-pay

## 2023-06-20 MED ORDER — PANTOPRAZOLE SODIUM 40 MG PO TBEC: 40.0000 mg | DELAYED_RELEASE_TABLET | Freq: Two times a day (BID) | ORAL | 5 refills | Status: DC

## 2023-06-22 ENCOUNTER — Other Ambulatory Visit: Payer: Self-pay | Admitting: Oncology

## 2023-06-22 DIAGNOSIS — C8510 Unspecified B-cell lymphoma, unspecified site: Secondary | ICD-10-CM

## 2023-06-26 ENCOUNTER — Other Ambulatory Visit: Payer: Self-pay

## 2023-06-26 MED ORDER — FLUCONAZOLE 200 MG PO TABS: 200.0000 mg | ORAL_TABLET | Freq: Every day | ORAL | 5 refills | Status: DC

## 2023-06-27 ENCOUNTER — Telehealth: Payer: Self-pay | Admitting: Oncology

## 2023-06-27 MED FILL — Fosaprepitant Dimeglumine For IV Infusion 150 MG (Base Eq): INTRAVENOUS | Qty: 5 | Status: AC

## 2023-06-27 NOTE — Telephone Encounter (Signed)
 Jordan Calderon

## 2023-06-28 ENCOUNTER — Encounter: Payer: Self-pay | Admitting: Oncology

## 2023-06-28 ENCOUNTER — Telehealth: Payer: Self-pay

## 2023-06-28 ENCOUNTER — Inpatient Hospital Stay: Payer: Medicare Other

## 2023-06-28 ENCOUNTER — Inpatient Hospital Stay (HOSPITAL_BASED_OUTPATIENT_CLINIC_OR_DEPARTMENT_OTHER): Payer: Medicare Other | Admitting: Oncology

## 2023-06-28 ENCOUNTER — Inpatient Hospital Stay: Payer: Medicare Other | Attending: Oncology

## 2023-06-28 ENCOUNTER — Other Ambulatory Visit: Payer: Self-pay

## 2023-06-28 VITALS — BP 135/87 | HR 91 | Temp 97.9°F | Resp 17 | Ht 69.0 in | Wt 169.3 lb

## 2023-06-28 DIAGNOSIS — C8510 Unspecified B-cell lymphoma, unspecified site: Secondary | ICD-10-CM

## 2023-06-28 DIAGNOSIS — D701 Agranulocytosis secondary to cancer chemotherapy: Secondary | ICD-10-CM

## 2023-06-28 DIAGNOSIS — R768 Other specified abnormal immunological findings in serum: Secondary | ICD-10-CM

## 2023-06-28 DIAGNOSIS — C8338 Diffuse large B-cell lymphoma, lymph nodes of multiple sites: Secondary | ICD-10-CM | POA: Insufficient documentation

## 2023-06-28 DIAGNOSIS — G893 Neoplasm related pain (acute) (chronic): Secondary | ICD-10-CM

## 2023-06-28 DIAGNOSIS — T451X5A Adverse effect of antineoplastic and immunosuppressive drugs, initial encounter: Secondary | ICD-10-CM

## 2023-06-28 DIAGNOSIS — B20 Human immunodeficiency virus [HIV] disease: Secondary | ICD-10-CM | POA: Diagnosis not present

## 2023-06-28 DIAGNOSIS — Z5112 Encounter for antineoplastic immunotherapy: Secondary | ICD-10-CM | POA: Diagnosis not present

## 2023-06-28 DIAGNOSIS — Z5111 Encounter for antineoplastic chemotherapy: Secondary | ICD-10-CM | POA: Insufficient documentation

## 2023-06-28 DIAGNOSIS — Z8619 Personal history of other infectious and parasitic diseases: Secondary | ICD-10-CM | POA: Insufficient documentation

## 2023-06-28 DIAGNOSIS — Z5189 Encounter for other specified aftercare: Secondary | ICD-10-CM | POA: Insufficient documentation

## 2023-06-28 DIAGNOSIS — Z95828 Presence of other vascular implants and grafts: Secondary | ICD-10-CM | POA: Insufficient documentation

## 2023-06-28 LAB — CBC WITH DIFFERENTIAL (CANCER CENTER ONLY)
Abs Immature Granulocytes: 0.33 10*3/uL — ABNORMAL HIGH (ref 0.00–0.07)
Basophils Absolute: 0.1 10*3/uL (ref 0.0–0.1)
Basophils Relative: 2 %
Eosinophils Absolute: 0 10*3/uL (ref 0.0–0.5)
Eosinophils Relative: 0 %
HCT: 31.1 % — ABNORMAL LOW (ref 39.0–52.0)
Hemoglobin: 10.4 g/dL — ABNORMAL LOW (ref 13.0–17.0)
Immature Granulocytes: 4 %
Lymphocytes Relative: 8 %
Lymphs Abs: 0.6 10*3/uL — ABNORMAL LOW (ref 0.7–4.0)
MCH: 31.6 pg (ref 26.0–34.0)
MCHC: 33.4 g/dL (ref 30.0–36.0)
MCV: 94.5 fL (ref 80.0–100.0)
Monocytes Absolute: 0.5 10*3/uL (ref 0.1–1.0)
Monocytes Relative: 6 %
Neutro Abs: 6.7 10*3/uL (ref 1.7–7.7)
Neutrophils Relative %: 80 %
Platelet Count: 286 10*3/uL (ref 150–400)
RBC: 3.29 MIL/uL — ABNORMAL LOW (ref 4.22–5.81)
RDW: 15.6 % — ABNORMAL HIGH (ref 11.5–15.5)
WBC Count: 8.3 10*3/uL (ref 4.0–10.5)
nRBC: 0.4 % — ABNORMAL HIGH (ref 0.0–0.2)

## 2023-06-28 LAB — CMP (CANCER CENTER ONLY)
ALT: 17 U/L (ref 0–44)
AST: 19 U/L (ref 15–41)
Albumin: 4.1 g/dL (ref 3.5–5.0)
Alkaline Phosphatase: 78 U/L (ref 38–126)
Anion gap: 7 (ref 5–15)
BUN: 25 mg/dL — ABNORMAL HIGH (ref 8–23)
CO2: 27 mmol/L (ref 22–32)
Calcium: 9.5 mg/dL (ref 8.9–10.3)
Chloride: 104 mmol/L (ref 98–111)
Creatinine: 1.11 mg/dL (ref 0.61–1.24)
GFR, Estimated: >60 mL/min (ref >60)
Glucose, Bld: 100 mg/dL — ABNORMAL HIGH (ref 70–99)
Potassium: 4.2 mmol/L (ref 3.5–5.1)
Sodium: 138 mmol/L (ref 135–145)
Total Bilirubin: 0.3 mg/dL (ref 0.0–1.2)
Total Protein: 7.5 g/dL (ref 6.5–8.1)

## 2023-06-28 LAB — HEPATITIS B SURFACE ANTIGEN: Hepatitis B Surface Ag: NONREACTIVE

## 2023-06-28 LAB — HEPATITIS B CORE ANTIBODY, TOTAL: Hep B Core Total Ab: REACTIVE — AB

## 2023-06-28 MED ORDER — SODIUM CHLORIDE 0.9 % IV SOLN
750.0000 mg/m2 | Freq: Once | INTRAVENOUS | Status: AC
  Administered 2023-06-28: 1500 mg via INTRAVENOUS
  Filled 2023-06-28: qty 75

## 2023-06-28 MED ORDER — SODIUM CHLORIDE 0.9% FLUSH
10.0000 mL | Freq: Once | INTRAVENOUS | Status: AC
  Administered 2023-06-28: 10 mL

## 2023-06-28 MED ORDER — SODIUM CHLORIDE 0.9 % IV SOLN
INTRAVENOUS | Status: DC
Start: 2023-06-28 — End: 2023-06-28

## 2023-06-28 MED ORDER — SODIUM CHLORIDE 0.9 % IV SOLN
375.0000 mg/m2 | Freq: Once | INTRAVENOUS | Status: AC
  Administered 2023-06-28: 700 mg via INTRAVENOUS
  Filled 2023-06-28: qty 50

## 2023-06-28 MED ORDER — ACETAMINOPHEN 325 MG PO TABS
650.0000 mg | ORAL_TABLET | Freq: Once | ORAL | Status: AC
  Administered 2023-06-28: 650 mg via ORAL
  Filled 2023-06-28: qty 2

## 2023-06-28 MED ORDER — DOXORUBICIN HCL CHEMO IV INJECTION 2 MG/ML
50.0000 mg/m2 | Freq: Once | INTRAVENOUS | Status: AC
  Administered 2023-06-28: 94 mg via INTRAVENOUS
  Filled 2023-06-28: qty 47

## 2023-06-28 MED ORDER — SODIUM CHLORIDE 0.9 % IV SOLN
150.0000 mg | Freq: Once | INTRAVENOUS | Status: AC
  Administered 2023-06-28: 150 mg via INTRAVENOUS
  Filled 2023-06-28: qty 150

## 2023-06-28 MED ORDER — PALONOSETRON HCL INJECTION 0.25 MG/5ML
0.2500 mg | Freq: Once | INTRAVENOUS | Status: AC
  Administered 2023-06-28: 0.25 mg via INTRAVENOUS
  Filled 2023-06-28: qty 5

## 2023-06-28 MED ORDER — HEPARIN SOD (PORK) LOCK FLUSH 100 UNIT/ML IV SOLN
500.0000 [IU] | Freq: Once | INTRAVENOUS | Status: AC | PRN
Start: 2023-06-28 — End: 2023-06-28
  Administered 2023-06-28: 500 [IU]

## 2023-06-28 MED ORDER — SODIUM CHLORIDE 0.9 % IV SOLN
2.0000 mg | Freq: Once | INTRAVENOUS | Status: AC
  Administered 2023-06-28: 2 mg via INTRAVENOUS
  Filled 2023-06-28: qty 2

## 2023-06-28 MED ORDER — DEXAMETHASONE SODIUM PHOSPHATE 10 MG/ML IJ SOLN
10.0000 mg | Freq: Once | INTRAMUSCULAR | Status: AC
  Administered 2023-06-28: 10 mg via INTRAVENOUS
  Filled 2023-06-28: qty 1

## 2023-06-28 MED ORDER — DIPHENHYDRAMINE HCL 25 MG PO CAPS
50.0000 mg | ORAL_CAPSULE | Freq: Once | ORAL | Status: AC
  Administered 2023-06-28: 50 mg via ORAL
  Filled 2023-06-28: qty 2

## 2023-06-28 MED ORDER — SODIUM CHLORIDE 0.9% FLUSH
10.0000 mL | INTRAVENOUS | Status: DC | PRN
  Administered 2023-06-28: 10 mL

## 2023-06-28 MED ORDER — SODIUM CHLORIDE 0.9 % IV SOLN: 375.0000 mg/m2 | Freq: Once | INTRAVENOUS | Status: DC

## 2023-06-28 NOTE — Assessment & Plan Note (Addendum)
-   Currently under reasonable control.  Has close follow-up with ID, Dr.Comer.  He is considering switching his HIV therapy to [facility]  Continue Symtuza for now  -He has an appointment coming up with Dr. Luciana Axe later this month.

## 2023-06-28 NOTE — Assessment & Plan Note (Addendum)
-   Neutropenia has resolved now.  White count is normal with ANC of 6700.  Will continue Neulasta with each cycle.  -Advised patient to avoid exposure to sick individuals due to lowered immunity.

## 2023-06-28 NOTE — Assessment & Plan Note (Signed)
 -  He does have a past history of hepatitis B infection  -Hepatitis B surface antigen is negative and core total antibody was positive.  It could be from his past infection.  HBV DNA PCR was negative on 06/09/2023.  -Since patient is already on Symtuza, there is low risk of reactivation and hence we proceeded with rituximab along with rest of the chemotherapy regimen

## 2023-06-28 NOTE — Assessment & Plan Note (Addendum)
 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 -Recently started him on tramadol and since pain was inadequately controlled, he was prescribed morphine.  Will monitor and titrate medications as needed.  Lately he has not needed morphine and has been taking tramadol as needed.

## 2023-06-28 NOTE — Telephone Encounter (Signed)
Open in error

## 2023-06-28 NOTE — Patient Instructions (Signed)
 [facility] - A DEPT OF [facility]  Discharge Instructions: Thank you for choosing [facility] to provide your oncology and hematology care.   If you have a lab appointment with the Cancer Center, please go directly to the Cancer Center and check in at the registration area.   Wear comfortable clothing and clothing appropriate for easy access to any Portacath or PICC line.   We strive to give you quality time with your provider. You may need to reschedule your appointment if you arrive late (15 or more minutes).  Arriving late affects you and other patients whose appointments are after yours.  Also, if you miss three or more appointments without notifying the office, you may be dismissed from the clinic at the provider's discretion.      For prescription refill requests, have your pharmacy contact our office and allow 72 hours for refills to be completed.    Today you received the following chemotherapy and/or immunotherapy agents: Doxorubicin, Vincristine, Cytoxan, Rituximab.       To help prevent nausea and vomiting after your treatment, we encourage you to take your nausea medication as directed.  BELOW ARE SYMPTOMS THAT SHOULD BE REPORTED IMMEDIATELY: *FEVER GREATER THAN 100.4 F (38 C) OR HIGHER *CHILLS OR SWEATING *NAUSEA AND VOMITING THAT IS NOT CONTROLLED WITH YOUR NAUSEA MEDICATION *UNUSUAL SHORTNESS OF BREATH *UNUSUAL BRUISING OR BLEEDING *URINARY PROBLEMS (pain or burning when urinating, or frequent urination) *BOWEL PROBLEMS (unusual diarrhea, constipation, pain near the anus) TENDERNESS IN MOUTH AND THROAT WITH OR WITHOUT PRESENCE OF ULCERS (sore throat, sores in mouth, or a toothache) UNUSUAL RASH, SWELLING OR PAIN  UNUSUAL VAGINAL DISCHARGE OR ITCHING   Items with * indicate a potential emergency and should be followed up as soon as possible or go to the Emergency Department if any problems should occur.  Please show the  CHEMOTHERAPY ALERT CARD or IMMUNOTHERAPY ALERT CARD at check-in to the Emergency Department and triage nurse.  Should you have questions after your visit or need to cancel or reschedule your appointment, please contact [facility] - A DEPT OF [facility]  Dept: [phone]  and follow the prompts.  Office hours are 8:00 a.m. to 4:30 p.m. Monday - Friday. Please note that voicemails left after 4:00 p.m. may not be returned until the following business day.  We are closed weekends and major holidays. You have access to a nurse at all times for urgent questions. Please call the main number to the clinic Dept: [phone] and follow the prompts.   For any non-urgent questions, you may also contact your provider using MyChart. We now offer e-Visits for anyone 89 and older to request care online for non-urgent symptoms. For details visit [email or web address].   Also download the MyChart app! Go to the app store, search "MyChart", open the app, select [place], and log in with your MyChart username and password.  Doxorubicin Injection What is this medication? DOXORUBICIN (dox oh ROO bi sin) treats some types of cancer. It works by slowing down the growth of cancer cells. This medicine may be used for other purposes; ask your health care provider or pharmacist if you have questions. COMMON BRAND NAME(S): Adriamycin, Adriamycin PFS, Adriamycin RDF, Rubex What should I tell my care team before I take this medication? They need to know if you have any of these conditions: Heart disease History of low blood cell levels caused by  a medication Liver disease Recent or ongoing radiation An unusual or allergic reaction to doxorubicin, other medications, foods, dyes, or preservatives If you or your partner are pregnant or trying to get pregnant Breast-feeding How should I use this medication? This medication is injected into a vein. It is given by your care team in a  hospital or clinic setting. Talk to your care team about the use of this medication in children. Special care may be needed. Overdosage: If you think you have taken too much of this medicine contact a poison control center or emergency room at once. NOTE: This medicine is only for you. Do not share this medicine with others. What if I miss a dose? Keep appointments for follow-up doses. It is important not to miss your dose. Call your care team if you are unable to keep an appointment. What may interact with this medication? 6-mercaptopurine Paclitaxel Phenytoin St. John's wort Trastuzumab Verapamil This list may not describe all possible interactions. Give your health care provider a list of all the medicines, herbs, non-prescription drugs, or dietary supplements you use. Also tell them if you smoke, drink alcohol, or use illegal drugs. Some items may interact with your medicine. What should I watch for while using this medication? Your condition will be monitored carefully while you are receiving this medication. You may need blood work while taking this medication. This medication may make you feel generally unwell. This is not uncommon as chemotherapy can affect healthy cells as well as cancer cells. Report any side effects. Continue your course of treatment even though you feel ill unless your care team tells you to stop. There is a maximum amount of this medication you should receive throughout your life. The amount depends on the medical condition being treated and your overall health. Your care team will watch how much of this medication you receive. Tell your care team if you have taken this medication before. Your urine may turn red for a few days after your dose. This is not blood. If your urine is dark or brown, call your care team. In some cases, you may be given additional medications to help with side effects. Follow all directions for their use. This medication may increase your  risk of getting an infection. Call your care team for advice if you get a fever, chills, sore throat, or other symptoms of a cold or flu. Do not treat yourself. Try to avoid being around people who are sick. This medication may increase your risk to bruise or bleed. Call your care team if you notice any unusual bleeding. Talk to your care team about your risk of cancer. You may be more at risk for certain types of cancers if you take this medication. Talk to your care team if you or your partner may be pregnant. Serious birth defects can occur if you take this medication during pregnancy and for 6 months after the last dose. Contraception is recommended while taking this medication and for 6 months after the last dose. Your care team can help you find the option that works for you. If your partner can get pregnant, use a condom while taking this medication and for 6 months after the last dose. Do not breastfeed while taking this medication. This medication may cause infertility. Talk to your care team if you are concerned about your fertility. What side effects may I notice from receiving this medication? Side effects that you should report to your care team as soon as possible:  Allergic reactions--skin rash, itching, hives, swelling of the face, lips, tongue, or throat Heart failure--shortness of breath, swelling of the ankles, feet, or hands, sudden weight gain, unusual weakness or fatigue Heart rhythm changes--fast or irregular heartbeat, dizziness, feeling faint or lightheaded, chest pain, trouble breathing Infection--fever, chills, cough, sore throat, wounds that don't heal, pain or trouble when passing urine, general feeling of discomfort or being unwell Low red blood cell level--unusual weakness or fatigue, dizziness, headache, trouble breathing Painful swelling, warmth, or redness of the skin, blisters or sores at the infusion site Unusual bruising or bleeding Side effects that usually do not  require medical attention (report to your care team if they continue or are bothersome): Diarrhea Hair loss Nausea Pain, redness, or swelling with sores inside the mouth or throat Red urine This list may not describe all possible side effects. Call your doctor for medical advice about side effects. You may report side effects to FDA at 1-800-FDA-1088. Where should I keep my medication? This medication is given in a hospital or clinic. It will not be stored at home. NOTE: This sheet is a summary. It may not cover all possible information. If you have questions about this medicine, talk to your doctor, pharmacist, or health care provider.  2024 Elsevier/Gold Standard (2022-09-08 00:00:00)  Vincristine Injection What is this medication? VINCRISTINE (vin KRIS teen) treats some types of cancer. It works by slowing down the growth of cancer cells. This medicine may be used for other purposes; ask your health care provider or pharmacist if you have questions. COMMON BRAND NAME(S): Oncovin, Vincasar PFS What should I tell my care team before I take this medication? They need to know if you have any of these conditions: Infection Kidney disease Liver disease Low white blood cell levels Lung disease Nervous system disease, such as Charcot-Marie-Tooth (CMT) Recent or ongoing radiation therapy An unusual or allergic reaction to vincristine, other chemotherapy agents, other medications, foods, dyes, or preservatives Pregnant or trying to get pregnant Breast-feeding How should I use this medication? This medication is infused into a vein. It is given by your care team in a hospital or clinic setting. Talk to your care team about the use of this medication in children. While it may be given to children for selected conditions, precautions do apply. Overdosage: If you think you have taken too much of this medicine contact a poison control center or emergency room at once. NOTE: This medicine is  only for you. Do not share this medicine with others. What if I miss a dose? Keep appointments for follow-up doses. It is important not to miss your dose. Call your care team if you are unable to keep an appointment. What may interact with this medication? Do not take this medication with any of the following: Live virus vaccines This medication may also interact with the following: Medications for fungal infections, such as itraconazole or fluconazole Phenytoin Supplements, such as St. John's wort This list may not describe all possible interactions. Give your health care provider a list of all the medicines, herbs, non-prescription drugs, or dietary supplements you use. Also tell them if you smoke, drink alcohol, or use illegal drugs. Some items may interact with your medicine. What should I watch for while using this medication? Your condition will be monitored carefully while you are receiving this medication. This medication may make you feel generally unwell. This is not uncommon as chemotherapy can affect healthy cells as well as cancer cells. Report any side effects. Continue  your course of treatment even though you feel ill unless your care team tells you to stop. You may need blood work while taking this medication. This medication may increase your risk to bruise or bleed. Call your care team if you notice any unusual bleeding. This medication may increase your risk of getting an infection. Call your care team for advice if you get a fever, chills, sore throat, or other symptoms of a cold or flu. Do not treat yourself. Try to avoid being around people who are sick. This medication will cause constipation. If you do not have a bowel movement for 3 days, call your care team. Call your care team if you are around anyone with measles, chickenpox, or if you develop sores or blisters that do not heal properly. Be careful brushing or flossing your teeth or using a toothpick because you may get  an infection or bleed more easily. If you have any dental work done, tell your dentist you are receiving this medication Talk to your care team if you or your partner wish to become pregnant or think either of you might be pregnant. This medication can cause serious birth defects. This medication may cause infertility. Talk to your care team if you are concerned about your fertility. Talk to your care team before breastfeeding. Changes to your treatment plan may be needed. What side effects may I notice from receiving this medication? Side effects that you should report to your care team as soon as possible: Allergic reactions--skin rash, itching, hives, swelling of the face, lips, tongue, or throat High uric acid level--severe pain, redness, warmth, or swelling in joints, pain or trouble passing urine, pain in the lower back or sides Infection--fever, chills, cough, sore throat, wounds that don't heal, pain or trouble when passing urine, general feeling of discomfort or being unwell Pain, tingling, or numbness in the hands or feet, muscle weakness, change in vision, confusion or trouble speaking, loss of balance or coordination, trouble walking, seizures Painful swelling, warmth, or redness of the skin, blisters or sores at the infusion site Shortness of breath or trouble breathing Side effects that usually do not require medical attention (report to your care team if they continue or are bothersome): Constipation Diarrhea Hair loss Loss of appetite Nausea Stomach cramping Vomiting This list may not describe all possible side effects. Call your doctor for medical advice about side effects. You may report side effects to FDA at 1-800-FDA-1088. Where should I keep my medication? This medication is given in a hospital or clinic. It will not be stored at home. NOTE: This sheet is a summary. It may not cover all possible information. If you have questions about this medicine, talk to your doctor,  pharmacist, or health care provider.  2024 Elsevier/Gold Standard (2021-08-31 00:00:00)  Cyclophosphamide Injection What is this medication? CYCLOPHOSPHAMIDE (sye kloe FOSS fa mide) treats some types of cancer. It works by slowing down the growth of cancer cells. This medicine may be used for other purposes; ask your health care provider or pharmacist if you have questions. COMMON BRAND NAME(S): Cyclophosphamide, Cytoxan, Neosar What should I tell my care team before I take this medication? They need to know if you have any of these conditions: Heart disease Irregular heartbeat or rhythm Infection Kidney problems Liver disease Low blood cell levels (white cells, platelets, or red blood cells) Lung disease Previous radiation Trouble passing urine An unusual or allergic reaction to cyclophosphamide, other medications, foods, dyes, or preservatives Pregnant or trying to  get pregnant Breast-feeding How should I use this medication? This medication is injected into a vein. It is given by your care team in a hospital or clinic setting. Talk to your care team about the use of this medication in children. Special care may be needed. Overdosage: If you think you have taken too much of this medicine contact a poison control center or emergency room at once. NOTE: This medicine is only for you. Do not share this medicine with others. What if I miss a dose? Keep appointments for follow-up doses. It is important not to miss your dose. Call your care team if you are unable to keep an appointment. What may interact with this medication? Amphotericin B Amiodarone Azathioprine Certain antivirals for HIV or hepatitis Certain medications for blood pressure, such as enalapril, lisinopril, quinapril Cyclosporine Diuretics Etanercept Indomethacin Medications that relax muscles Metronidazole Natalizumab Tamoxifen Warfarin This list may not describe all possible interactions. Give your health  care provider a list of all the medicines, herbs, non-prescription drugs, or dietary supplements you use. Also tell them if you smoke, drink alcohol, or use illegal drugs. Some items may interact with your medicine. What should I watch for while using this medication? This medication may make you feel generally unwell. This is not uncommon as chemotherapy can affect healthy cells as well as cancer cells. Report any side effects. Continue your course of treatment even though you feel ill unless your care team tells you to stop. You may need blood work while you are taking this medication. This medication may increase your risk of getting an infection. Call your care team for advice if you get a fever, chills, sore throat, or other symptoms of a cold or flu. Do not treat yourself. Try to avoid being around people who are sick. Avoid taking medications that contain aspirin, acetaminophen, ibuprofen, naproxen, or ketoprofen unless instructed by your care team. These medications may hide a fever. Be careful brushing or flossing your teeth or using a toothpick because you may get an infection or bleed more easily. If you have any dental work done, tell your dentist you are receiving this medication. Drink water or other fluids as directed. Urinate often, even at night. Some products may contain alcohol. Ask your care team if this medication contains alcohol. Be sure to tell all care teams you are taking this medicine. Certain medicines, like metronidazole and disulfiram, can cause an unpleasant reaction when taken with alcohol. The reaction includes flushing, headache, nausea, vomiting, sweating, and increased thirst. The reaction can last from 30 minutes to several hours. Talk to your care team if you wish to become pregnant or think you might be pregnant. This medication can cause serious birth defects if taken during pregnancy and for 1 year after the last dose. A negative pregnancy test is required before  starting this medication. A reliable form of contraception is recommended while taking this medication and for 1 year after the last dose. Talk to your care team about reliable forms of contraception. Do not father a child while taking this medication and for 4 months after the last dose. Use a condom during this time period. Do not breast-feed while taking this medication or for 1 week after the last dose. This medication may cause infertility. Talk to your care team if you are concerned about your fertility. Talk to your care team about your risk of cancer. You may be more at risk for certain types of cancer if you take this medication.  What side effects may I notice from receiving this medication? Side effects that you should report to your care team as soon as possible: Allergic reactions--skin rash, itching, hives, swelling of the face, lips, tongue, or throat Dry cough, shortness of breath or trouble breathing Heart failure--shortness of breath, swelling of the ankles, feet, or hands, sudden weight gain, unusual weakness or fatigue Heart muscle inflammation--unusual weakness or fatigue, shortness of breath, chest pain, fast or irregular heartbeat, dizziness, swelling of the ankles, feet, or hands Heart rhythm changes--fast or irregular heartbeat, dizziness, feeling faint or lightheaded, chest pain, trouble breathing Infection--fever, chills, cough, sore throat, wounds that don't heal, pain or trouble when passing urine, general feeling of discomfort or being unwell Kidney injury--decrease in the amount of urine, swelling of the ankles, hands, or feet Liver injury--right upper belly pain, loss of appetite, nausea, light-colored stool, dark yellow or brown urine, yellowing skin or eyes, unusual weakness or fatigue Low red blood cell level--unusual weakness or fatigue, dizziness, headache, trouble breathing Low sodium level--muscle weakness, fatigue, dizziness, headache, confusion Red or dark  brown urine Unusual bruising or bleeding Side effects that usually do not require medical attention (report to your care team if they continue or are bothersome): Hair loss Irregular menstrual cycles or spotting Loss of appetite Nausea Pain, redness, or swelling with sores inside the mouth or throat Vomiting This list may not describe all possible side effects. Call your doctor for medical advice about side effects. You may report side effects to FDA at 1-800-FDA-1088. Where should I keep my medication? This medication is given in a hospital or clinic. It will not be stored at home. NOTE: This sheet is a summary. It may not cover all possible information. If you have questions about this medicine, talk to your doctor, pharmacist, or health care provider.  2024 Elsevier/Gold Standard (2021-10-22 00:00:00)  Rituximab Injection What is this medication? RITUXIMAB (ri TUX i mab) treats leukemia and lymphoma. It works by blocking a protein that causes cancer cells to grow and multiply. This helps to slow or stop the spread of cancer cells. It may also be used to treat autoimmune conditions, such as arthritis. It works by slowing down an overactive immune system. It is a monoclonal antibody. This medicine may be used for other purposes; ask your health care provider or pharmacist if you have questions. COMMON BRAND NAME(S): RIABNI, Rituxan, RUXIENCE, truxima What should I tell my care team before I take this medication? They need to know if you have any of these conditions: Chest pain Heart disease Immune system problems Infection, such as chickenpox, cold sores, hepatitis B, herpes Irregular heartbeat or rhythm Kidney disease Low blood counts, such as low white cells, platelets, red cells Lung disease Recent or upcoming vaccine An unusual or allergic reaction to rituximab, other medications, foods, dyes, or preservatives Pregnant or trying to get pregnant Breast-feeding How should I use  this medication? This medication is injected into a vein. It is given by a care team in a hospital or clinic setting. A special MedGuide will be given to you before each treatment. Be sure to read this information carefully each time. Talk to your care team about the use of this medication in children. While this medication may be prescribed for children as young as 6 months for selected conditions, precautions do apply. Overdosage: If you think you have taken too much of this medicine contact a poison control center or emergency room at once. NOTE: This medicine is only for  you. Do not share this medicine with others. What if I miss a dose? Keep appointments for follow-up doses. It is important not to miss your dose. Call your care team if you are unable to keep an appointment. What may interact with this medication? Do not take this medication with any of the following: Live vaccines This medication may also interact with the following: Cisplatin This list may not describe all possible interactions. Give your health care provider a list of all the medicines, herbs, non-prescription drugs, or dietary supplements you use. Also tell them if you smoke, drink alcohol, or use illegal drugs. Some items may interact with your medicine. What should I watch for while using this medication? Your condition will be monitored carefully while you are receiving this medication. You may need blood work while taking this medication. This medication can cause serious infusion reactions. To reduce the risk your care team may give you other medications to take before receiving this one. Be sure to follow the directions from your care team. This medication may increase your risk of getting an infection. Call your care team for advice if you get a fever, chills, sore throat, or other symptoms of a cold or flu. Do not treat yourself. Try to avoid being around people who are sick. Call your care team if you are around  anyone with measles, chickenpox, or if you develop sores or blisters that do not heal properly. Avoid taking medications that contain aspirin, acetaminophen, ibuprofen, naproxen, or ketoprofen unless instructed by your care team. These medications may hide a fever. This medication may cause serious skin reactions. They can happen weeks to months after starting the medication. Contact your care team right away if you notice fevers or flu-like symptoms with a rash. The rash may be red or purple and then turn into blisters or peeling of the skin. You may also notice a red rash with swelling of the face, lips, or lymph nodes in your neck or under your arms. In some patients, this medication may cause a serious brain infection that may cause death. If you have any problems seeing, thinking, speaking, walking, or standing, tell your care team right away. If you cannot reach your care team, urgently seek another source of medical care. Talk to your care team if you may be pregnant. Serious birth defects can occur if you take this medication during pregnancy and for 12 months after the last dose. You will need a negative pregnancy test before starting this medication. Contraception is recommended while taking this medication and for 12 months after the last dose. Your care team can help you find the option that works for you. Do not breastfeed while taking this medication and for at least 6 months after the last dose. What side effects may I notice from receiving this medication? Side effects that you should report to your care team as soon as possible: Allergic reactions or angioedema--skin rash, itching or hives, swelling of the face, eyes, lips, tongue, arms, or legs, trouble swallowing or breathing Bowel blockage--stomach cramping, unable to have a bowel movement or pass gas, loss of appetite, vomiting Dizziness, loss of balance or coordination, confusion or trouble speaking Heart attack--pain or tightness in  the chest, shoulders, arms, or jaw, nausea, shortness of breath, cold or clammy skin, feeling faint or lightheaded Heart rhythm changes--fast or irregular heartbeat, dizziness, feeling faint or lightheaded, chest pain, trouble breathing Infection--fever, chills, cough, sore throat, wounds that don't heal, pain or trouble when passing  urine, general feeling of discomfort or being unwell Infusion reactions--chest pain, shortness of breath or trouble breathing, feeling faint or lightheaded Kidney injury--decrease in the amount of urine, swelling of the ankles, hands, or feet Liver injury--right upper belly pain, loss of appetite, nausea, light-colored stool, dark yellow or brown urine, yellowing skin or eyes, unusual weakness or fatigue Redness, blistering, peeling, or loosening of the skin, including inside the mouth Stomach pain that is severe, does not go away, or gets worse Tumor lysis syndrome (TLS)--nausea, vomiting, diarrhea, decrease in the amount of urine, dark urine, unusual weakness or fatigue, confusion, muscle pain or cramps, fast or irregular heartbeat, joint pain Side effects that usually do not require medical attention (report to your care team if they continue or are bothersome): Headache Joint pain Nausea Runny or stuffy nose Unusual weakness or fatigue This list may not describe all possible side effects. Call your doctor for medical advice about side effects. You may report side effects to FDA at 1-800-FDA-1088. Where should I keep my medication? This medication is given in a hospital or clinic. It will not be stored at home. NOTE: This sheet is a summary. It may not cover all possible information. If you have questions about this medicine, talk to your doctor, pharmacist, or health care provider.  2024 [employer]vier/Gold Standard (2021-10-28 00:00:00)

## 2023-06-28 NOTE — Progress Notes (Signed)
 Rapid Infusion Rituximab  Pharmacist Evaluation  Jordan Calderon is a 63 y.o. male being treated with rituximab  for NHL. This patient may be considered for RIR.   A pharmacist has verified the patient tolerated rituximab  infusions per the [facility] standard infusion protocol without grade 3-4 infusion reactions. The treatment plan will be updated to reflect RIR if the patient qualifies per the checklist below:   Age > 69 years old Yes   Clinically significant cardiovascular disease No   Circulating lymphocyte count < 5000/uL prior to cycle two Yes  Lab Results  Component Value Date   LYMPHSABS 0.6 (L) 06/28/2023    Prior documented grade 3-4 infusion reaction to rituximab  No   Prior documented grade 1-2 infusion reaction to rituximab  (If YES, Pharmacist will confirm with Physician if patient is still a candidate for RIR) No   Previous rituximab  infusion within the past 6 months Yes   Treatment Plan updated orders to reflect RIR Yes    Jordan Calderon does meet the criteria for Rapid Infusion Rituximab . This patient is going to be switched to rapid infusion rituximab . Confirmed w Dr. Remus Wilma Dollar, Pharm.D., CPP 06/28/2023@10 :56 AM

## 2023-06-28 NOTE — Assessment & Plan Note (Addendum)
 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 FISH testing positive for BCL6 and negative for Bcl-2 and C-MYC.  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 Associated cervical, upper abdominal, and mesenteric nodal implants. Osseous involvement in the left sacrum/pelvis.   -Previously I discussed PET/CT findings with the patient, his sister and also showed them the images.  This will serve as a good baseline.  -As patient was complaining of recent onset headaches and tremors, we obtained CSF analysis to make sure there is no lymphoma involvement.  Underwent LP on 06/08/2023.  Cytology from CSF fluid was negative.  Imaging studies showed no gross abnormalities intracranially.  -Hepatitis B core antibody positive and surface antigen negative, likely from past infection.  Previously I discussed his case with his infectious disease doctor Dr. Efrain.  Since patient is on Symtuza , he does not need additional treatment for this. HBV DNA PCR was negative on 06/09/2023.  -He started cycle 1 of R-CHOP on 06/09/2023.  Tolerated first cycle well and has not noticed any major side effects.  He did receive Neulasta  on 06/12/2023.  -Labs today showed normalization of white count with a white count of 8300, ANC of 6700.  Hemoglobin stable at 10.4, platelet count stable at 286,000.  CMP unremarkable.  No dose-limiting toxicities.  -Due for cycle 2 of R-CHOP today.  Will proceed with chemotherapy without dose reductions.  He will receive Neulasta  on day 3.  -We will monitor his CD4 count periodically.  We will have to hold chemotherapy for CD4 count of less than 50.  -RTC in 3 weeks for cycle 3 of chemotherapy.  Restaging PET scan will be obtained after  completion of 3 cycles.

## 2023-06-28 NOTE — Progress Notes (Signed)
 [facility]  ONCOLOGY CLINIC PROGRESS NOTE   Patient Care Team: Pcp, No as PCP - General Liddie Kent, LCSW as Counselor Comer, Lamar ORN, MD as Consulting Physician (Infectious Diseases) Autumn Millman, MD as Consulting Physician (Hematology and Oncology)  PATIENT NAME: Jordan Calderon   MR#: [id number] DOB: 11/24/1960  Date of visit: 06/28/2023   ASSESSMENT & PLAN:   Jordan Calderon is a 63 y.o. gentleman with past medical history of HIV disease, depression, hypothyroidism, recent syncopal episode in November 2024 for which he was hospitalized from 05/11/2023 until 05/12/2023 with grossly negative TTE and CT head workup, was admitted to the hospital again on 05/17/2023 after he presented with dizziness, headaches.  CT angiogram of the head and neck on 05/17/2023 showed large right nasopharyngeal skull base mass which was suboptimally evaluated but was worrisome for nasopharyngeal carcinoma.  He was admitted for further evaluation and management.  Eventual workup revealed large B-cell lymphoma, high-grade, with multiple liver lesions and biopsies from stomach positive for non-Hodgkin lymphoma.  Stage IV disease.  FISH testing positive for BCL6 and negative for Bcl-2 and C-MYC.  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 FISH testing positive for BCL6 and negative for Bcl-2 and C-MYC.  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 Associated cervical, upper abdominal, and mesenteric nodal implants. Osseous involvement in the left sacrum/pelvis.   -Previously I discussed PET/CT findings with the patient, his sister and also showed them the images.  This will serve as a good  baseline.  -As patient was complaining of recent onset headaches and tremors, we obtained CSF analysis to make sure there is no lymphoma involvement.  Underwent LP on 06/08/2023.  Cytology from CSF fluid was negative.  Imaging studies showed no gross abnormalities intracranially.  -Hepatitis B core antibody positive and surface antigen negative, likely from past infection.  Previously I discussed his case with his infectious disease doctor Dr. Efrain.  Since patient is on Symtuza , he does not need additional treatment for this. HBV DNA PCR was negative on 06/09/2023.  -He started cycle 1 of R-CHOP on 06/09/2023.  Tolerated first cycle well and has not noticed any major side effects.  He did receive Neulasta  on 06/12/2023.  -Labs today showed normalization of white count with a white count of 8300, ANC of 6700.  Hemoglobin stable at 10.4, platelet count stable at 286,000.  CMP unremarkable.  No dose-limiting toxicities.  -Due for cycle 2 of R-CHOP today.  Will proceed with chemotherapy without dose reductions.  He will receive Neulasta  on day 3.  -We will monitor his CD4 count periodically.  We will have to hold chemotherapy for CD4 count of less than 50.  -RTC in 3 weeks for cycle 3 of chemotherapy.  Restaging PET scan will be obtained after completion of 3 cycles.  Chemotherapy induced neutropenia (HCC) - Neutropenia has resolved now.  White count is normal with ANC of 6700.  Will continue Neulasta  with each cycle.  -Advised patient to avoid exposure to sick individuals due to lowered immunity.  Human immunodeficiency virus (HIV) disease (HCC) - Currently under reasonable control.  Has close follow-up with ID, Dr.Comer.  He is considering switching his HIV therapy to Biktarvy .  Continue Symtuza  for now  -He has an appointment coming up  with Dr. Efrain later this month.  Hepatitis B core antibody positive - He does have a past history of hepatitis B infection  -Hepatitis B surface antigen  is negative and core total antibody was positive.  It could be from his past infection.  HBV DNA PCR was negative on 06/09/2023.  -Since patient is already on Symtuza , there is low risk of reactivation and hence we proceeded with rituximab  along with rest of the chemotherapy regimen  Cancer associated pain Severe, intermittent headache and facial pain, possibly related to nasopharyngeal mass. -Recently started him on tramadol  and since pain was inadequately controlled, he was prescribed morphine .  Will monitor and titrate medications as needed.  Lately he has not needed morphine  and has been taking tramadol  as needed.    I reviewed lab results and outside records for this visit and discussed relevant results with the patient. Diagnosis, plan of care and treatment options were also discussed in detail with the patient. Opportunity provided to ask questions and answers provided to his apparent satisfaction. Provided instructions to call our clinic with any problems, questions or concerns prior to return visit. I recommended to continue follow-up with PCP and sub-specialists. He verbalized understanding and agreed with the plan.   NCCN guidelines have been consulted in the planning of this patient's care.  I spent a total of 45 minutes during this encounter with the patient including review of chart and various tests results, discussions about plan of care and coordination of care plan.   Chinita Patten, MD  06/28/2023 1:26 PM   [facility] - A DEPT OF [facility] [address] [place] [address] Dept: [phone] Dept Fax: [phone]    CHIEF COMPLAINT/ REASON FOR VISIT:   Recently diagnosed high-grade B-cell lymphoma, large cell type, stage IV disease with multiple liver lesions. FISH testing positive for BCL6 and negative for Bcl-2 and C-MYC.  Current Treatment: R-CHOP started from 06/09/2023.  INTERVAL HISTORY:     Discussed the use of AI scribe software for clinical note transcription with the patient, who gave verbal consent to proceed.   He was accompanied by his sister today, who is very supportive.  His sister lives in [place], [place].    He reports persistent jaw pain and headaches, although improved compared to prior. The jaw pain is most severe with the first bite or drink, but subsides thereafter. The headaches, attributed to the mask used during treatment, are constant and have not improved. Despite these symptoms, the patient reports feeling generally well, with no complaints of nausea or vomiting. The patient's appetite is good, and he has gained weight since the last visit.  I have reviewed the past medical history, past surgical history, social history and family history with the patient and they are unchanged from previous note.  HISTORY OF PRESENT ILLNESS:   63 y.o. gentleman with past medical history of HIV disease, depression, hypothyroidism, recent syncopal episode in November 2024 for which he was hospitalized from 05/11/2023 until 05/12/2023 with grossly negative TTE and CT head workup, was admitted to the hospital again on 05/17/2023 after he presented with dizziness, headaches.  CT angiogram of the head and neck on 05/17/2023 showed large right nasopharyngeal skull base mass which was suboptimally evaluated but was worrisome for nasopharyngeal carcinoma.  He was admitted for further evaluation and management.  Eventual workup revealed large B-cell lymphoma, high-grade, with multiple liver lesions and biopsies from stomach positive for non-Hodgkin  lymphoma.  Stage IV disease.   ENT evaluated him and performed biopsy of right nasopharyngeal mass on 05/19/2023.  We were consulted for any additional recommendations.   MR angiogram of the head without IV contrast on 05/11/2023 showed 3 mm outpouching extending posteriorly and laterally from the left ICA terminus, potentially reflecting a  vascular infundibulum versus aneurysm.  Otherwise normal intracranial MRA.   CT soft tissue of the neck on 05/18/2023 showed large right nasopharyngeal mass that extended inferiorly to the level of the greater horn of the hyoid bone on the right.  This mass involves right-sided adenoid and palatine tonsils, right parapharyngeal space, right masticator space, right parotid space.  Superiorly this most extended to the level of foreman ovale bilaterally.  A 9 mm hypodense lesion in the left parotid gland was noted, unclear if necrotic lymph node versus intraparotid cyst.  On 05/19/2023, ENT performed excision of nasopharyngeal mass.  Pathology showed respiratory mucosa with chronic sinusitis and prominent secondary follicle/germinal center formation consistent with the presence of tonsillar type tissue.  Negative for malignancy.  On 05/20/2023, CT scan of the chest showed multiple liver lesions, concerning for metastatic disease, at least 15 lesions.  It also showed upper abdominal abnormal lymph nodes.  On 05/23/2023, CT abdomen and pelvis showed similar findings in the liver and gastrohepatic lymph nodes.  Focal wall thickening of the mid gastric lumen with also greater at the lesser curvature of stomach.  Left lung base lung nodule measuring 6 mm.  On 05/22/2023, GI performed upper endoscopy for further evaluation of anemia.  Esophageal mucosal changes classified as Barrett's stage C2-M3.  LA grade D esophagitis.  Esophageal plaques were found, consistent with candidiasis.  Nonbleeding gastric ulcer which was biopsied.  Pathology from stomach ulcer biopsy came back positive for non-Hodgkin's lymphoma, large cell type.  Esophageal biopsy showed Barrett's esophagus.  On 05/23/2023, he underwent core needle biopsy of liver lesions.  Pathology showed high-grade B-cell lymphoma, large cell type.  Ki-67 was more than 50%. FISH testing positive for BCL6 and negative for Bcl-2 and C-MYC.  Staging PET/CT performed  on 06/05/2023: 5.4 cm right nasopharyngeal mass, likely corresponding to the patient's known lymphoma.Multifocal pulmonary lymphoma.  Innumerable hepatic metastases. Associated cervical, upper abdominal, and mesenteric nodal implants. Osseous involvement in the left sacrum/pelvis.  Started chemotherapy with R-CHOP from 06/09/2023.  Oncology History  High grade B-cell lymphoma (HCC)  05/31/2023 Initial Diagnosis   DLBCL (diffuse large B cell lymphoma) (HCC)   06/05/2023 Cancer Staging   Staging form: Hodgkin and Non-Hodgkin Lymphoma, AJCC 8th Edition - Clinical: Stage IV (Diffuse large B-cell lymphoma) - Signed by Autumn Millman, MD on 06/05/2023 Stage prefix: Initial diagnosis   06/09/2023 -  Chemotherapy   Patient is on Treatment Plan : NON-HODGKINS LYMPHOMA R-CHOP q21d         REVIEW OF SYSTEMS:   Review of Systems  HENT:          Has trouble swallowing with the first swallow but no problems thereafter.  Respiratory:  Positive for shortness of breath (on moderate exertion).   Neurological:  Positive for numbness.    All other pertinent systems were reviewed with the patient and are negative.  ALLERGIES: He is allergic to sulfonamide derivatives.  MEDICATIONS:  Current Outpatient Medications  Medication Sig Dispense Refill   acyclovir  (ZOVIRAX ) 400 MG tablet Take 1 tablet (400 mg total) by mouth daily. 30 tablet 3   allopurinol  (ZYLOPRIM ) 300 MG tablet Take 1 tablet (300 mg total) by  mouth daily. 30 tablet 3   bictegravir-emtricitabine -tenofovir  AF (BIKTARVY ) 50-200-25 MG TABS tablet Take 1 tablet by mouth daily. 30 tablet 11   dapsone  100 MG tablet Take 1 tablet (100 mg total) by mouth daily. (Patient taking differently: Take 100 mg by mouth daily with breakfast.) 30 tablet 5   fluconazole  (DIFLUCAN ) 200 MG tablet Take 1 tablet (200 mg total) by mouth daily. 30 tablet 5   lidocaine -prilocaine  (EMLA ) cream Apply to affected area once 30 g 3   morphine  (MSIR) 15 MG tablet  Take 1 tablet (15 mg total) by mouth every 6 (six) hours as needed for severe pain (pain score 7-10). 25 tablet 0   ondansetron  (ZOFRAN ) 8 MG tablet Take 1 tablet (8 mg total) by mouth every 8 (eight) hours as needed for nausea or vomiting. Start on the third day after cyclophosphamide  chemotherapy. 30 tablet 1   pantoprazole  (PROTONIX ) 40 MG tablet Take 1 tablet (40 mg total) by mouth 2 (two) times daily before a meal. 60 tablet 5   predniSONE  (DELTASONE ) 20 MG tablet Take 5 tablets (100 mg total) by mouth daily. Take with food on days 1-5 of chemotherapy. 25 tablet 5   prochlorperazine  (COMPAZINE ) 10 MG tablet Take 1 tablet (10 mg total) by mouth every 6 (six) hours as needed for nausea or vomiting. 30 tablet 6   traMADol  (ULTRAM ) 50 MG tablet Take 50 mg by mouth 3 (three) times daily as needed for moderate pain (pain score 4-6).     calcium  carbonate (TUMS - DOSED IN MG ELEMENTAL CALCIUM ) 500 MG chewable tablet Chew 1 tablet by mouth 3 (three) times daily with meals. (Patient not taking: Reported on 06/28/2023)     No current facility-administered medications for this visit.   Facility-Administered Medications Ordered in Other Visits  Medication Dose Route Frequency Provider Last Rate Last Admin   0.9 %  sodium chloride  infusion   Intravenous Continuous Fynlee Rowlands, MD 10 mL/hr at 06/28/23 1046 New Bag at 06/28/23 1046   heparin  lock flush 100 unit/mL  500 Units Intracatheter Once PRN Evangelynn Lochridge, MD       sodium chloride  flush (NS) 0.9 % injection 10 mL  10 mL Intracatheter PRN Rusty Villella, MD         VITALS:   Blood pressure 135/87, pulse 91, temperature 97.9 F (36.6 C), temperature source Oral, resp. rate 17, height 5' 9 (1.753 m), weight 169 lb 4.8 oz (76.8 kg), SpO2 98%.  Wt Readings from Last 3 Encounters:  06/28/23 169 lb 4.8 oz (76.8 kg)  06/16/23 166 lb 8 oz (75.5 kg)  06/12/23 162 lb 0.6 oz (73.5 kg)    Body mass index is 25 kg/m.  Performance status (ECOG): 1 -  Symptomatic but completely ambulatory  PHYSICAL EXAM:   Physical Exam Constitutional:      General: He is not in acute distress.    Appearance: Normal appearance.  HENT:     Head: Normocephalic and atraumatic.  Eyes:     General: No scleral icterus.    Conjunctiva/sclera: Conjunctivae normal.  Cardiovascular:     Rate and Rhythm: Normal rate and regular rhythm.     Heart sounds: Normal heart sounds.  Pulmonary:     Effort: Pulmonary effort is normal.     Breath sounds: Normal breath sounds.  Abdominal:     General: There is no distension.  Musculoskeletal:     Right lower leg: No edema.     Left lower leg: No edema.  Lymphadenopathy:     Cervical: Cervical adenopathy (much improved) present.  Neurological:     General: No focal deficit present.     Mental Status: He is alert and oriented to person, place, and time.  Psychiatric:     Comments: In good spirits today      LABORATORY DATA:   I have reviewed the data as listed.  Results for orders placed or performed in visit on 06/28/23  CBC with Differential (Cancer Center Only)  Result Value Ref Range   WBC Count 8.3 4.0 - 10.5 K/uL   RBC 3.29 (L) 4.22 - 5.81 MIL/uL   Hemoglobin 10.4 (L) 13.0 - 17.0 g/dL   HCT 68.8 (L) 60.9 - 47.9 %   MCV 94.5 80.0 - 100.0 fL   MCH 31.6 26.0 - 34.0 pg   MCHC 33.4 30.0 - 36.0 g/dL   RDW 84.3 (H) 88.4 - 84.4 %   Platelet Count 286 150 - 400 K/uL   nRBC 0.4 (H) 0.0 - 0.2 %   Neutrophils Relative % 80 %   Neutro Abs 6.7 1.7 - 7.7 K/uL   Lymphocytes Relative 8 %   Lymphs Abs 0.6 (L) 0.7 - 4.0 K/uL   Monocytes Relative 6 %   Monocytes Absolute 0.5 0.1 - 1.0 K/uL   Eosinophils Relative 0 %   Eosinophils Absolute 0.0 0.0 - 0.5 K/uL   Basophils Relative 2 %   Basophils Absolute 0.1 0.0 - 0.1 K/uL   Immature Granulocytes 4 %   Abs Immature Granulocytes 0.33 (H) 0.00 - 0.07 K/uL  CMP (Cancer Center only)  Result Value Ref Range   Sodium 138 135 - 145 mmol/L   Potassium 4.2 3.5 -  5.1 mmol/L   Chloride 104 98 - 111 mmol/L   CO2 27 22 - 32 mmol/L   Glucose, Bld 100 (H) 70 - 99 mg/dL   BUN 25 (H) 8 - 23 mg/dL   Creatinine 8.88 9.38 - 1.24 mg/dL   Calcium  9.5 8.9 - 10.3 mg/dL   Total Protein 7.5 6.5 - 8.1 g/dL   Albumin 4.1 3.5 - 5.0 g/dL   AST 19 15 - 41 U/L   ALT 17 0 - 44 U/L   Alkaline Phosphatase 78 38 - 126 U/L   Total Bilirubin 0.3 0.0 - 1.2 mg/dL   GFR, Estimated >39 >39 mL/min   Anion gap 7 5 - 15     RADIOGRAPHIC STUDIES:  I have personally reviewed the radiological images as listed and agree with the findings in the report.  CT HEAD W & WO CONTRAST ( ) Result Date: 06/12/2023 CLINICAL DATA:  Head/neck cancer, monitor nasopharnyngeal mass, worsening headache and neck pain EXAM: CT HEAD WITHOUT AND WITH CONTRAST TECHNIQUE: Contiguous axial images were obtained from the base of the skull through the vertex without and with intravenous contrast. RADIATION DOSE REDUCTION: This exam was performed according to the departmental dose-optimization program which includes automated exposure control, adjustment of the mA and/or kV according to patient size and/or use of iterative reconstruction technique. CONTRAST:  75mL OMNIPAQUE  IOHEXOL  300 MG/ML  SOLN COMPARISON:  CT head May 11, 2023. FINDINGS: Brain: No evidence of acute infarction, hemorrhage, hydrocephalus, extra-axial collection or mass lesion/mass effect. Vascular: No hyperdense vessel identified. Skull: No acute fracture. Sinuses/Orbits: Clear sinuses.  No acute orbital findings. Other: Known nasopharyngeal mass is only partially imaged on this study. IMPRESSION: 1. No evidence of acute intracranial abnormality. 2. Known nasopharyngeal mass is only partially imaged on this study.  A CT of the neck with contrast could further characterize if clinically warranted. Electronically Signed   By: Gilmore GORMAN Molt M.D.   On: 06/12/2023 18:46   NM PET Image Initial (PI) Skull Base To Thigh Result Date:  06/11/2023 CLINICAL DATA:  Initial treatment strategy for DLBCL. EXAM: NUCLEAR MEDICINE PET SKULL BASE TO THIGH TECHNIQUE: 8.1 mCi F-18 FDG was injected intravenously. Full-ring PET imaging was performed from the skull base to thigh after the radiotracer. CT data was obtained and used for attenuation correction and anatomic localization. Fasting blood glucose: 90 mg/dl COMPARISON:  CTA chest dated 05/29/2023. CT abdomen/pelvis dated 05/23/2023. CT neck dated 05/18/2023. FINDINGS: Mediastinal blood pool activity: SUV max 2.2 Liver activity: SUV max NA NECK: 5.4 cm right nasopharyngeal mass (series 4/image 25), max SUV 37.0, likely corresponding the patient's known lymphoma. Small bilateral cervical nodes, including a 5 mm short axis left level 3 node (series 4/image 37), max SUV 11.6 Incidental CT findings: None. CHEST: Mild soft tissue thickening along a left upper lobe bronchus (series 7/image 27), max SUV 23.6. Additional 2.3 cm nodule in the medial left upper lobe/lingula (series 7/image 36), max SUV 22.2. Mild focal hypermetabolism in the right lower lobe, max SUV 6.7, possibly corresponding to a 7 mm subpleural nodule at the right lung base (series 7, image 81). No hypermetabolic thoracic lymphadenopathy. Incidental CT findings: Moderate coronary atherosclerosis of the LAD and left circumflex. ABDOMEN/PELVIS: Innumerable hypermetabolic hepatic lesions, including a dominant 5.3 cm lesion in segment 2 (series 4/image 106), max SUV 30.6. Masslike wall thickening along the posterior aspect of the gastric antrum (series 4/image 122), max SUV 32.2. 1.9 cm gastrohepatic node (series 4/image 111), max SUV 29.2. Additional 10 mm short axis portacaval node (series 4/image 115), max SUV 15.5. Scattered small mesenteric nodes versus peritoneal implants, including a 14 x 9 mm lesion along the left renal mesentery (series 4/image 145), max SUV 15.9. Incidental CT findings: Mild atherosclerotic calcifications of the  abdominal aorta and branch vessels. SKELETON: Left sacral hypermetabolism, max SUV 7.7. Posterior left iliac bone hypermetabolism, max SUV 24.5. No CT correlate. Incidental CT findings: None. IMPRESSION: 5.4 cm right nasopharyngeal mass, likely corresponding to the patient's known lymphoma. Multifocal pulmonary lymphoma.  Innumerable hepatic metastases. Associated cervical, upper abdominal, and mesenteric nodal implants, as above. Osseous involvement in the left sacrum/pelvis. Electronically Signed   By: Pinkie Pebbles M.D.   On: 06/11/2023 21:01   DG FL GUIDED LUMBAR PUNCTURE Result Date: 06/08/2023 CLINICAL DATA:  Newly diagnosed high-grade B-cell lymphoma. Concern for CNS involvement. Request for diagnostic lumbar puncture EXAM: DIAGNOSTIC LUMBAR PUNCTURE UNDER FLUOROSCOPIC GUIDANCE FLUOROSCOPY TIME:  Radiation Exposure Index (as provided by the fluoroscopic device): 5.4 mGy If the device does not provide the exposure index: Fluoroscopy Time (in minutes and seconds):  0 minutes, 12 seconds Number of Acquired Images:  1 PROCEDURE: Informed consent was obtained from the patient prior to the procedure, including potential complications of headache, allergy, and pain. With the patient prone, the lower back was prepped with Betadine. 1% Lidocaine  was used for local anesthesia. Lumbar puncture was performed at the L2-L3 level using a 20 gauge needle with return of clear colorless CSF with an opening pressure of 12 cm water. Approximately 10 ml of CSF were obtained for laboratory studies. The patient tolerated the procedure well and there were no apparent complications. IMPRESSION: Technically successful lumbar puncture from L2-L3 level without complication. Procedure performed by Franky Rusk PA-C supervised by Dr. Ester Sides Electronically Signed  By: Ester Sides M.D.   On: 06/08/2023 14:31   IR IMAGING GUIDED PORT INSERTION Result Date: 06/06/2023 INDICATION: 63 year old male referred for port  catheter EXAM: IMAGE GUIDED PORT CATHETER MEDICATIONS: None ANESTHESIA/SEDATION: Moderate (conscious) sedation was employed during this procedure. A total of Versed  2.0 mg and Fentanyl  100 mcg was administered intravenously. Moderate Sedation Time: 20 minutes. The patient's level of consciousness and vital signs were monitored continuously by radiology nursing throughout the procedure under my direct supervision. FLUOROSCOPY TIME:  Fluoroscopy Time:   (1 mGy). COMPLICATIONS: None PROCEDURE: Informed written consent was obtained from the patient after a discussion of the risks, benefits, and alternatives to treatment. Questions regarding the procedure were encouraged and answered. The right neck and chest were prepped with chlorhexidine  in a sterile fashion, and a sterile drape was applied covering the operative field. Maximum barrier sterile technique with sterile gowns and gloves were used for the procedure. A timeout was performed prior to the initiation of the procedure. Ultrasound survey was performed with images stored and sent to PACs. Right IJ vein documented to be patent. The right neck and chest was prepped with chlorhexidine , and draped in the usual sterile fashion using maximum barrier technique (cap and mask, sterile gown, sterile gloves, large sterile sheet, hand hygiene and cutaneous antiseptic). Local anesthesia was attained by infiltration with 1% lidocaine  without epinephrine . Ultrasound demonstrated patency of the right internal jugular vein, and this was documented with an image. Under real-time ultrasound guidance, this vein was accessed with a 21 gauge micropuncture needle and image documentation was performed. A small dermatotomy was made at the access site with an 11 scalpel. A 0.018 wire was advanced into the SVC and used to estimate the length of the internal catheter. The access needle exchanged for a 28F micropuncture vascular sheath. The 0.018 wire was then removed and a 0.035 wire  advanced into the IVC. An appropriate location for the subcutaneous reservoir was selected below the clavicle and an incision was made through the skin and underlying soft tissues. The subcutaneous tissues were then dissected using a combination of blunt and sharp surgical technique and a pocket was formed. A single lumen power injectable portacatheter was then tunneled through the subcutaneous tissues from the pocket to the dermatotomy and the port reservoir placed within the subcutaneous pocket. The venous access site was then serially dilated and a peel away vascular sheath placed over the wire. The wire was removed and the port catheter advanced into position under fluoroscopic guidance. The catheter tip is positioned in the cavoatrial junction. This was documented with a spot image. The portacatheter was then tested and found to flush and aspirate well. The port was flushed with saline followed by 100 units/mL heparinized saline. The pocket was then closed in two layers using first subdermal inverted interrupted absorbable sutures followed by a running subcuticular suture. The epidermis was then sealed with Dermabond. The dermatotomy at the venous access site was also seal with Dermabond. Patient tolerated the procedure well and remained hemodynamically stable throughout. No complications encountered and no significant blood loss encountered IMPRESSION: Status post right IJ port catheter Signed, Ami RAMAN. Alona ROSALEA GRAVER, RPVI [facility] Radiology Electronically Signed   By: Ami Alona D.O.   On: 06/06/2023 14:30    CODE STATUS:  Code Status History     Date Active Date Inactive Code Status Order ID Comments User Context   05/29/2023 0850 05/30/2023 2252 Full Code [id number]  Zella Katha HERO,  MD ED   05/17/2023 1906 05/24/2023 1834 Full Code [id number]  Sim Emery CROME, MD Inpatient   05/17/2023 1532 05/17/2023 1906 Full Code [id number]  Moody Alto,  MD ED   05/11/2023 2024 05/12/2023 2308 Full Code [id number]  Lonzell Emeline HERO, DO Inpatient   07/01/2018 1624 07/03/2018 1537 Full Code [id number]  Raenelle Coria, MD Inpatient   02/10/2014 2054 02/12/2014 0031 Full Code [id number]  Monty Bail, MD Inpatient    Questions for Most Recent Historical Code Status (Order [id number])     Question Answer   By: Consent: discussion documented in EHR            No orders of the defined types were placed in this encounter.    Future Appointments  Date Time Provider Department Center  06/30/2023  2:15 PM [facility] FLUSH [facility] None  07/19/2023 10:45 AM [facility] FLUSH [facility] None  07/19/2023 11:20 AM Wlliam Grosso, MD [facility] None  07/19/2023 12:30 PM [facility] INFUSION [facility] None  07/21/2023  8:45 AM [facility] LAB [facility]  07/21/2023  2:15 PM [facility] FLUSH [facility] None  08/09/2023 11:00 AM [facility] FLUSH [facility] None  08/09/2023 11:40 AM Charmane Protzman, MD [facility] None  08/09/2023 12:30 PM [facility] INFUSION [facility] None  08/11/2023  2:15 PM [facility] FLUSH [facility] None  08/23/2023  9:30 AM Comer, Lamar ORN, MD [facility]  08/30/2023 10:10 AM Armbruster, Elspeth SQUIBB, MD [facility]      This document was completed utilizing speech recognition software. Grammatical errors, random word insertions, pronoun errors, and incomplete sentences are an occasional consequence of this system due to software limitations, ambient noise, and hardware issues. Any formal questions or concerns about the content, text or information contained within the body of this dictation should be directly addressed to the provider for clarification.

## 2023-06-30 ENCOUNTER — Other Ambulatory Visit: Payer: Self-pay

## 2023-06-30 ENCOUNTER — Encounter: Payer: Self-pay | Admitting: Oncology

## 2023-06-30 ENCOUNTER — Inpatient Hospital Stay: Payer: Medicare Other

## 2023-06-30 VITALS — BP 154/90 | HR 86 | Temp 98.0°F | Resp 18

## 2023-06-30 DIAGNOSIS — Z5111 Encounter for antineoplastic chemotherapy: Secondary | ICD-10-CM | POA: Diagnosis not present

## 2023-06-30 DIAGNOSIS — C8510 Unspecified B-cell lymphoma, unspecified site: Secondary | ICD-10-CM

## 2023-06-30 MED ORDER — PEGFILGRASTIM-FPGK 6 MG/0.6ML ~~LOC~~ SOSY
6.0000 mg | PREFILLED_SYRINGE | Freq: Once | SUBCUTANEOUS | Status: AC
Start: 2023-06-30 — End: 2023-06-30
  Administered 2023-06-30: 6 mg via SUBCUTANEOUS
  Filled 2023-06-30: qty 0.6

## 2023-07-13 ENCOUNTER — Other Ambulatory Visit: Payer: Self-pay

## 2023-07-13 DIAGNOSIS — B20 Human immunodeficiency virus [HIV] disease: Secondary | ICD-10-CM

## 2023-07-18 MED FILL — Fosaprepitant Dimeglumine For IV Infusion 150 MG (Base Eq): INTRAVENOUS | Qty: 5 | Status: AC

## 2023-07-19 ENCOUNTER — Encounter: Payer: Self-pay | Admitting: Oncology

## 2023-07-19 ENCOUNTER — Other Ambulatory Visit: Payer: Medicare Other

## 2023-07-19 ENCOUNTER — Inpatient Hospital Stay: Payer: Medicare Other

## 2023-07-19 ENCOUNTER — Telehealth: Payer: Self-pay

## 2023-07-19 ENCOUNTER — Other Ambulatory Visit: Payer: Self-pay

## 2023-07-19 ENCOUNTER — Inpatient Hospital Stay: Payer: Medicare Other | Admitting: Oncology

## 2023-07-19 VITALS — BP 173/74 | HR 88 | Temp 97.8°F | Resp 18 | Ht 69.0 in | Wt 175.6 lb

## 2023-07-19 DIAGNOSIS — Z95828 Presence of other vascular implants and grafts: Secondary | ICD-10-CM

## 2023-07-19 DIAGNOSIS — T451X5A Adverse effect of antineoplastic and immunosuppressive drugs, initial encounter: Secondary | ICD-10-CM

## 2023-07-19 DIAGNOSIS — G893 Neoplasm related pain (acute) (chronic): Secondary | ICD-10-CM | POA: Diagnosis not present

## 2023-07-19 DIAGNOSIS — C8510 Unspecified B-cell lymphoma, unspecified site: Secondary | ICD-10-CM

## 2023-07-19 DIAGNOSIS — D701 Agranulocytosis secondary to cancer chemotherapy: Secondary | ICD-10-CM

## 2023-07-19 DIAGNOSIS — B20 Human immunodeficiency virus [HIV] disease: Secondary | ICD-10-CM | POA: Diagnosis not present

## 2023-07-19 DIAGNOSIS — Z5111 Encounter for antineoplastic chemotherapy: Secondary | ICD-10-CM | POA: Diagnosis not present

## 2023-07-19 DIAGNOSIS — R768 Other specified abnormal immunological findings in serum: Secondary | ICD-10-CM

## 2023-07-19 LAB — CBC WITH DIFFERENTIAL (CANCER CENTER ONLY)
Abs Immature Granulocytes: 0.08 10*3/uL — ABNORMAL HIGH (ref 0.00–0.07)
Basophils Absolute: 0.1 10*3/uL (ref 0.0–0.1)
Basophils Relative: 1 %
Eosinophils Absolute: 0.1 10*3/uL (ref 0.0–0.5)
Eosinophils Relative: 1 %
HCT: 32.1 % — ABNORMAL LOW (ref 39.0–52.0)
Hemoglobin: 10.5 g/dL — ABNORMAL LOW (ref 13.0–17.0)
Immature Granulocytes: 1 %
Lymphocytes Relative: 6 %
Lymphs Abs: 0.5 10*3/uL — ABNORMAL LOW (ref 0.7–4.0)
MCH: 31.1 pg (ref 26.0–34.0)
MCHC: 32.7 g/dL (ref 30.0–36.0)
MCV: 95 fL (ref 80.0–100.0)
Monocytes Absolute: 0.2 10*3/uL (ref 0.1–1.0)
Monocytes Relative: 2 %
Neutro Abs: 7 10*3/uL (ref 1.7–7.7)
Neutrophils Relative %: 89 %
Platelet Count: 285 10*3/uL (ref 150–400)
RBC: 3.38 MIL/uL — ABNORMAL LOW (ref 4.22–5.81)
RDW: 17.4 % — ABNORMAL HIGH (ref 11.5–15.5)
WBC Count: 7.9 10*3/uL (ref 4.0–10.5)
nRBC: 0.3 % — ABNORMAL HIGH (ref 0.0–0.2)

## 2023-07-19 LAB — CMP (CANCER CENTER ONLY)
ALT: 21 U/L (ref 0–44)
AST: 20 U/L (ref 15–41)
Albumin: 4.5 g/dL (ref 3.5–5.0)
Alkaline Phosphatase: 68 U/L (ref 38–126)
Anion gap: 8 (ref 5–15)
BUN: 24 mg/dL — ABNORMAL HIGH (ref 8–23)
CO2: 24 mmol/L (ref 22–32)
Calcium: 9.6 mg/dL (ref 8.9–10.3)
Chloride: 108 mmol/L (ref 98–111)
Creatinine: 1.05 mg/dL (ref 0.61–1.24)
GFR, Estimated: >60 mL/min (ref >60)
Glucose, Bld: 126 mg/dL — ABNORMAL HIGH (ref 70–99)
Potassium: 4 mmol/L (ref 3.5–5.1)
Sodium: 140 mmol/L (ref 135–145)
Total Bilirubin: 0.4 mg/dL (ref 0.0–1.2)
Total Protein: 8 g/dL (ref 6.5–8.1)

## 2023-07-19 MED ORDER — SODIUM CHLORIDE 0.9% FLUSH
10.0000 mL | Freq: Once | INTRAVENOUS | Status: AC
  Administered 2023-07-19: 10 mL

## 2023-07-19 MED ORDER — DIPHENHYDRAMINE HCL 25 MG PO CAPS
50.0000 mg | ORAL_CAPSULE | Freq: Once | ORAL | Status: AC
Start: 2023-07-19 — End: 2023-07-19
  Administered 2023-07-19: 50 mg via ORAL
  Filled 2023-07-19 (×2): qty 2

## 2023-07-19 MED ORDER — HEPARIN SOD (PORK) LOCK FLUSH 100 UNIT/ML IV SOLN: 500.0000 [IU] | Freq: Once | INTRAVENOUS | Status: DC | PRN

## 2023-07-19 MED ORDER — MORPHINE SULFATE 15 MG PO TABS: 15.0000 mg | ORAL_TABLET | Freq: Four times a day (QID) | ORAL | 0 refills | Status: DC | PRN

## 2023-07-19 MED ORDER — TRAMADOL HCL 50 MG PO TABS: 50.0000 mg | ORAL_TABLET | Freq: Three times a day (TID) | ORAL | 0 refills | Status: DC | PRN

## 2023-07-19 MED ORDER — PALONOSETRON HCL INJECTION 0.25 MG/5ML
0.2500 mg | Freq: Once | INTRAVENOUS | Status: AC
  Administered 2023-07-19: 0.25 mg via INTRAVENOUS
  Filled 2023-07-19: qty 5

## 2023-07-19 MED ORDER — SODIUM CHLORIDE 0.9% FLUSH: 10.0000 mL | INTRAVENOUS | Status: DC | PRN

## 2023-07-19 MED ORDER — SODIUM CHLORIDE 0.9 % IV SOLN
375.0000 mg/m2 | Freq: Once | INTRAVENOUS | Status: AC
  Administered 2023-07-19: 700 mg via INTRAVENOUS
  Filled 2023-07-19: qty 50

## 2023-07-19 MED ORDER — SODIUM CHLORIDE 0.9 % IV SOLN
750.0000 mg/m2 | Freq: Once | INTRAVENOUS | Status: AC
  Administered 2023-07-19: 1500 mg via INTRAVENOUS
  Filled 2023-07-19: qty 75

## 2023-07-19 MED ORDER — DOXORUBICIN HCL CHEMO IV INJECTION 2 MG/ML
50.0000 mg/m2 | Freq: Once | INTRAVENOUS | Status: AC
  Administered 2023-07-19: 94 mg via INTRAVENOUS
  Filled 2023-07-19: qty 47

## 2023-07-19 MED ORDER — SODIUM CHLORIDE 0.9 % IV SOLN: INTRAVENOUS | Status: DC

## 2023-07-19 MED ORDER — SODIUM CHLORIDE 0.9 % IV SOLN
150.0000 mg | Freq: Once | INTRAVENOUS | Status: AC
  Administered 2023-07-19: 150 mg via INTRAVENOUS
  Filled 2023-07-19: qty 150

## 2023-07-19 MED ORDER — VINCRISTINE SULFATE CHEMO INJECTION 1 MG/ML
2.0000 mg | Freq: Once | INTRAVENOUS | Status: AC
  Administered 2023-07-19: 2 mg via INTRAVENOUS
  Filled 2023-07-19: qty 2

## 2023-07-19 MED ORDER — DEXAMETHASONE SODIUM PHOSPHATE 10 MG/ML IJ SOLN
10.0000 mg | Freq: Once | INTRAMUSCULAR | Status: AC
Start: 2023-07-19 — End: 2023-07-19
  Administered 2023-07-19: 10 mg via INTRAVENOUS
  Filled 2023-07-19: qty 1

## 2023-07-19 MED ORDER — ACETAMINOPHEN 325 MG PO TABS
650.0000 mg | ORAL_TABLET | Freq: Once | ORAL | Status: AC
  Administered 2023-07-19: 650 mg via ORAL
  Filled 2023-07-19: qty 2

## 2023-07-19 NOTE — Telephone Encounter (Signed)
Spoke with patient to provide Central Scheduling to get his Restaging PET Scan set up in two weeks (Feb 12th). He agreed to call Central Scheduling at [phone] to set up. Pre-authorization not needed per Merla Riches.

## 2023-07-19 NOTE — Assessment & Plan Note (Signed)
-  He does have a past history of hepatitis B infection  -Hepatitis B surface antigen is negative and core total antibody was positive.  It could be from his past infection.  HBV DNA PCR was negative on 06/09/2023.  -Since patient is already on Symtuza, there is low risk of reactivation and hence we proceeded with rituximab along with rest of the chemotherapy regimen

## 2023-07-19 NOTE — Patient Instructions (Signed)
[facility] - A DEPT OF [facility]  Discharge Instructions: Thank you for choosing [facility] to provide your oncology and hematology care.   If you have a lab appointment with the Cancer Center, please go directly to the Cancer Center and check in at the registration area.   Wear comfortable clothing and clothing appropriate for easy access to any Portacath or PICC line.   We strive to give you quality time with your provider. You may need to reschedule your appointment if you arrive late (15 or more minutes).  Arriving late affects you and other patients whose appointments are after yours.  Also, if you miss three or more appointments without notifying the office, you may be dismissed from the clinic at the provider's discretion.      For prescription refill requests, have your pharmacy contact our office and allow 72 hours for refills to be completed.    Today you received the following chemotherapy and/or immunotherapy agents :  Doxorubicin, Vincristine, Cyclophosphamide, & Rituxumab.       To help prevent nausea and vomiting after your treatment, we encourage you to take your nausea medication as directed.  BELOW ARE SYMPTOMS THAT SHOULD BE REPORTED IMMEDIATELY: *FEVER GREATER THAN 100.4 F (38 C) OR HIGHER *CHILLS OR SWEATING *NAUSEA AND VOMITING THAT IS NOT CONTROLLED WITH YOUR NAUSEA MEDICATION *UNUSUAL SHORTNESS OF BREATH *UNUSUAL BRUISING OR BLEEDING *URINARY PROBLEMS (pain or burning when urinating, or frequent urination) *BOWEL PROBLEMS (unusual diarrhea, constipation, pain near the anus) TENDERNESS IN MOUTH AND THROAT WITH OR WITHOUT PRESENCE OF ULCERS (sore throat, sores in mouth, or a toothache) UNUSUAL RASH, SWELLING OR PAIN  UNUSUAL VAGINAL DISCHARGE OR ITCHING   Items with * indicate a potential emergency and should be followed up as soon as possible or go to the Emergency Department if any problems should occur.  Please  show the CHEMOTHERAPY ALERT CARD or IMMUNOTHERAPY ALERT CARD at check-in to the Emergency Department and triage nurse.  Should you have questions after your visit or need to cancel or reschedule your appointment, please contact [facility] - A DEPT OF [facility]  Dept: [phone]  and follow the prompts.  Office hours are 8:00 a.m. to 4:30 p.m. Monday - Friday. Please note that voicemails left after 4:00 p.m. may not be returned until the following business day.  We are closed weekends and major holidays. You have access to a nurse at all times for urgent questions. Please call the main number to the clinic Dept: [phone] and follow the prompts.   For any non-urgent questions, you may also contact your provider using MyChart. We now offer e-Visits for anyone 35 and older to request care online for non-urgent symptoms. For details visit [email or web address].   Also download the MyChart app! Go to the app store, search "MyChart", open the app, select [place], and log in with your MyChart username and password.

## 2023-07-19 NOTE — Assessment & Plan Note (Signed)
-   Currently under reasonable control.  Has close follow-up with ID, Dr.Comer.  He is considering switching his HIV therapy to [facility]  Continue Symtuza for now  -He has an appointment coming up with Dr. Luciana Axe later this month.

## 2023-07-19 NOTE — Assessment & Plan Note (Signed)
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 -Recently started him on tramadol and since pain was inadequately controlled, he was prescribed morphine.  Will monitor and titrate medications as needed.  Lately he has not needed morphine and has been taking tramadol as needed.

## 2023-07-19 NOTE — Progress Notes (Signed)
[facility]  ONCOLOGY CLINIC PROGRESS NOTE   Patient Care Team: Pcp, No as PCP - General Roderic Scarce, LCSW as Counselor Comer, Belia Heman, MD as Consulting Physician (Infectious Diseases) Meryl Crutch, MD as Consulting Physician (Hematology and Oncology)  PATIENT NAME: Jordan Calderon   MR#: [id number] DOB: [birth year]  Date of visit: 07/19/2023   ASSESSMENT & PLAN:   Jordan Calderon is a 63 y.o. gentleman with past medical history of HIV disease, depression, hypothyroidism, recent syncopal episode in November 2024 for which he was hospitalized from 05/11/2023 until 05/12/2023 with grossly negative TTE and CT head workup, was admitted to the hospital again on 05/17/2023 after he presented with dizziness, headaches.  CT angiogram of the head and neck on 05/17/2023 showed large right nasopharyngeal skull base mass which was suboptimally evaluated but was worrisome for nasopharyngeal carcinoma.  He was admitted for further evaluation and management.  Eventual workup revealed large B-cell lymphoma, high-grade, with multiple liver lesions and biopsies from stomach positive for non-Hodgkin lymphoma.  Stage IV disease.  FISH testing positive for BCL6 and negative for Bcl-2 and C-MYC.  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 FISH testing positive for BCL6 and negative for Bcl-2 and C-MYC.  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 Associated cervical, upper abdominal, and mesenteric nodal implants. Osseous involvement in the left sacrum/pelvis.   -Previously I discussed PET/CT findings with the patient, his sister and also showed them the images.  This will serve as a good  baseline.  -As patient was complaining of recent onset headaches and tremors, we obtained CSF analysis to make sure there is no lymphoma involvement.  Underwent LP on 06/08/2023.  Cytology from CSF fluid was negative.  Imaging studies showed no gross abnormalities intracranially.  -Hepatitis B core antibody positive and surface antigen negative, likely from past infection.  Previously I discussed his case with his infectious disease doctor Dr. Luciana Axe.  Since patient is on Symtuza, he does not need additional treatment for this. HBV DNA PCR was negative on 06/09/2023.  -He started cycle 1 of R-CHOP on 06/09/2023.  He has been tolerating chemotherapy well.  -Labs today showed white count of 7900 with ANC of 7000.  Hemoglobin stable at 10.5, platelet count stable at 285,000.  CMP unremarkable.  No dose-limiting toxicities.  -Due for cycle 3 of R-CHOP today.  Will proceed with chemotherapy without dose reductions.  He will receive Neulasta on day 3.  -We will monitor his CD4 count periodically.  We will have to hold chemotherapy for CD4 count of less than 50.  -RTC in 3 weeks for cycle 4 of chemotherapy.  Restaging PET scan will be obtained in approximately 2 weeks from now and we will discuss results on return visit.  Chemotherapy induced neutropenia (HCC) - Neutropenia has resolved now.  White count is normal with ANC of 7000.  Will continue Neulasta with each cycle.  -Advised patient to avoid exposure to sick individuals due to lowered immunity.  Human immunodeficiency virus (HIV) disease (HCC) - Currently under reasonable control.  Has close follow-up with ID, Dr.Comer.  He is considering switching his HIV therapy to [facility].  Continue Symtuza for now  -He has an appointment coming up with Dr. Luciana Axe later this month.  Hepatitis B  core antibody positive - He does have a past history of hepatitis B infection  -Hepatitis B surface antigen is negative and core total antibody was positive.   It could be from his past infection.  HBV DNA PCR was negative on 06/09/2023.  -Since patient is already on Symtuza, there is low risk of reactivation and hence we proceeded with rituximab along with rest of the chemotherapy regimen  Cancer associated pain Severe, intermittent headache and facial pain, possibly related to nasopharyngeal mass. -Recently started him on tramadol and since pain was inadequately controlled, he was prescribed morphine.  Will monitor and titrate medications as needed.  Lately he has not needed morphine and has been taking tramadol as needed.     I reviewed lab results and outside records for this visit and discussed relevant results with the patient. Diagnosis, plan of care and treatment options were also discussed in detail with the patient. Opportunity provided to ask questions and answers provided to his apparent satisfaction. Provided instructions to call our clinic with any problems, questions or concerns prior to return visit. I recommended to continue follow-up with PCP and sub-specialists. He verbalized understanding and agreed with the plan.   NCCN guidelines have been consulted in the planning of this patient's care.  I spent a total of 30 minutes during this encounter with the patient including review of chart and various tests results, discussions about plan of care and coordination of care plan.   Meryl Crutch, MD  07/19/2023 1:23 PM  [facility] - A DEPT OF [facility] [address] [place] [address] Dept: [phone] Dept Fax: [phone]    CHIEF COMPLAINT/ REASON FOR VISIT:   Recently diagnosed high-grade B-cell lymphoma, large cell type, stage IV disease with multiple liver lesions. FISH testing positive for BCL6 and negative for Bcl-2 and C-MYC.  Current Treatment: R-CHOP started from 06/09/2023.  INTERVAL HISTORY:    Discussed the use of AI scribe software for  clinical note transcription with the patient, who gave verbal consent to proceed.   He was accompanied by his sister today, who is very supportive.  His sister lives in [place], [place].    He reports a significant weight gain, which is considered a positive sign in his treatment progress. He continues to experience pressure in his head and pain in the jaw, particularly during the initial swallow, which then eases. He also reports a one-time occurrence of a severe cramp in his right calf and toe, which lasted about an hour. The patient is currently on tramadol and morphine for pain management.   I have reviewed the past medical history, past surgical history, social history and family history with the patient and they are unchanged from previous note.  HISTORY OF PRESENT ILLNESS:   63 y.o. gentleman with past medical history of HIV disease, depression, hypothyroidism, recent syncopal episode in November 2024 for which he was hospitalized from 05/11/2023 until 05/12/2023 with grossly negative TTE and CT head workup, was admitted to the hospital again on 05/17/2023 after he presented with dizziness, headaches.  CT angiogram of the head and neck on 05/17/2023 showed large right nasopharyngeal skull base mass which was suboptimally evaluated but was worrisome for nasopharyngeal carcinoma.  He was admitted for further evaluation and management.  Eventual workup revealed large B-cell lymphoma, high-grade, with multiple liver lesions and biopsies from stomach positive for non-Hodgkin lymphoma.  Stage IV disease.   ENT evaluated him  and performed biopsy of right nasopharyngeal mass on 05/19/2023.  We were consulted for any additional recommendations.   MR angiogram of the head without IV contrast on 05/11/2023 showed 3 mm outpouching extending posteriorly and laterally from the left ICA terminus, potentially reflecting a vascular infundibulum versus aneurysm.  Otherwise normal intracranial MRA.   CT soft  tissue of the neck on 05/18/2023 showed large right nasopharyngeal mass that extended inferiorly to the level of the greater horn of the hyoid bone on the right.  This mass involves right-sided adenoid and palatine tonsils, right parapharyngeal space, right masticator space, right parotid space.  Superiorly this most extended to the level of foreman ovale bilaterally.  A 9 mm hypodense lesion in the left parotid gland was noted, unclear if necrotic lymph node versus intraparotid cyst.  On 05/19/2023, ENT performed excision of nasopharyngeal mass.  Pathology showed respiratory mucosa with chronic sinusitis and prominent secondary follicle/germinal center formation consistent with the presence of tonsillar type tissue.  Negative for malignancy.  On 05/20/2023, CT scan of the chest showed multiple liver lesions, concerning for metastatic disease, at least 15 lesions.  It also showed upper abdominal abnormal lymph nodes.  On 05/23/2023, CT abdomen and pelvis showed similar findings in the liver and gastrohepatic lymph nodes.  Focal wall thickening of the mid gastric lumen with also greater at the lesser curvature of stomach.  Left lung base lung nodule measuring 6 mm.  On 05/22/2023, GI performed upper endoscopy for further evaluation of anemia.  Esophageal mucosal changes classified as Barrett's stage C2-M3.  LA grade D esophagitis.  Esophageal plaques were found, consistent with candidiasis.  Nonbleeding gastric ulcer which was biopsied.  Pathology from stomach ulcer biopsy came back positive for non-Hodgkin's lymphoma, large cell type.  Esophageal biopsy showed Barrett's esophagus.  On 05/23/2023, he underwent core needle biopsy of liver lesions.  Pathology showed high-grade B-cell lymphoma, large cell type.  Ki-67 was more than 50%. FISH testing positive for BCL6 and negative for Bcl-2 and C-MYC.  Staging PET/CT performed on 06/05/2023: 5.4 cm right nasopharyngeal mass, likely corresponding to the patient's  known lymphoma.Multifocal pulmonary lymphoma.  Innumerable hepatic metastases. Associated cervical, upper abdominal, and mesenteric nodal implants. Osseous involvement in the left sacrum/pelvis.  Started chemotherapy with R-CHOP from 06/09/2023.  Oncology History  High grade B-cell lymphoma (HCC)  05/31/2023 Initial Diagnosis   DLBCL (diffuse large B cell lymphoma) (HCC)   06/05/2023 Cancer Staging   Staging form: Hodgkin and Non-Hodgkin Lymphoma, AJCC 8th Edition - Clinical: Stage IV (Diffuse large B-cell lymphoma) - Signed by Meryl Crutch, MD on 06/05/2023 Stage prefix: Initial diagnosis   06/09/2023 -  Chemotherapy   Patient is on Treatment Plan : NON-HODGKINS LYMPHOMA R-CHOP q21d         REVIEW OF SYSTEMS:   Review of Systems  HENT:          Has trouble swallowing with the first swallow but no problems thereafter.  Respiratory:  Positive for shortness of breath (on moderate exertion).   Neurological:  Positive for numbness.    All other pertinent systems were reviewed with the patient and are negative.  ALLERGIES: He is allergic to sulfonamide derivatives.  MEDICATIONS:  Current Outpatient Medications  Medication Sig Dispense Refill   acyclovir (ZOVIRAX) 400 MG tablet Take 1 tablet (400 mg total) by mouth daily. 30 tablet 3   allopurinol (ZYLOPRIM) 300 MG tablet Take 1 tablet (300 mg total) by mouth daily. 30 tablet 3   bictegravir-emtricitabine-tenofovir AF (BIKTARVY)  50-200-25 MG TABS tablet Take 1 tablet by mouth daily. 30 tablet 11   calcium carbonate (TUMS - DOSED IN MG ELEMENTAL CALCIUM) 500 MG chewable tablet Chew 1 tablet by mouth 3 (three) times daily with meals. (Patient not taking: Reported on 06/28/2023)     dapsone 100 MG tablet Take 1 tablet (100 mg total) by mouth daily. (Patient taking differently: Take 100 mg by mouth daily with breakfast.) 30 tablet 5   fluconazole (DIFLUCAN) 200 MG tablet Take 1 tablet (200 mg total) by mouth daily. 30 tablet 5    lidocaine-prilocaine (EMLA) cream Apply to affected area once 30 g 3   morphine (MSIR) 15 MG tablet Take 1 tablet (15 mg total) by mouth every 6 (six) hours as needed for severe pain (pain score 7-10). 30 tablet 0   ondansetron (ZOFRAN) 8 MG tablet Take 1 tablet (8 mg total) by mouth every 8 (eight) hours as needed for nausea or vomiting. Start on the third day after cyclophosphamide chemotherapy. 30 tablet 1   pantoprazole (PROTONIX) 40 MG tablet Take 1 tablet (40 mg total) by mouth 2 (two) times daily before a meal. 60 tablet 5   predniSONE (DELTASONE) 20 MG tablet Take 5 tablets (100 mg total) by mouth daily. Take with food on days 1-5 of chemotherapy. 25 tablet 5   prochlorperazine (COMPAZINE) 10 MG tablet Take 1 tablet (10 mg total) by mouth every 6 (six) hours as needed for nausea or vomiting. 30 tablet 6   traMADol (ULTRAM) 50 MG tablet Take 1 tablet (50 mg total) by mouth 3 (three) times daily as needed for moderate pain (pain score 4-6). 30 tablet 0   No current facility-administered medications for this visit.   Facility-Administered Medications Ordered in Other Visits  Medication Dose Route Frequency Provider Last Rate Last Admin   0.9 %  sodium chloride infusion   Intravenous Continuous Cedrik Heindl, MD 10 mL/hr at 07/19/23 1314 New Bag at 07/19/23 1314   acetaminophen (TYLENOL) tablet 650 mg  650 mg Oral Once Gracieann Stannard, MD       cyclophosphamide (CYTOXAN) 1,500 mg in sodium chloride 0.9 % 250 mL chemo infusion  750 mg/m2 (Treatment Plan Recorded) Intravenous Once Garold Sheeler, MD       dexamethasone (DECADRON) injection 10 mg  10 mg Intravenous Once Rasheda Ledger, MD       diphenhydrAMINE (BENADRYL) capsule 50 mg  50 mg Oral Once Stryder Poitra, MD       DOXOrubicin (ADRIAMYCIN) chemo injection 94 mg  50 mg/m2 (Treatment Plan Recorded) Intravenous Once Knut Rondinelli, MD       fosaprepitant (EMEND) 150 mg in sodium chloride 0.9 % 145 mL IVPB  150 mg Intravenous Once Raiyah Speakman,  Jerriann Schrom, MD       heparin lock flush 100 unit/mL  500 Units Intracatheter Once PRN Marquitta Persichetti, MD       palonosetron (ALOXI) injection 0.25 mg  0.25 mg Intravenous Once Malvina Schadler, MD       riTUXimab-pvvr (RUXIENCE) 700 mg in sodium chloride 0.9 % 180 mL infusion  375 mg/m2 (Treatment Plan Recorded) Intravenous Once Hadlee Burback, MD       sodium chloride flush (NS) 0.9 % injection 10 mL  10 mL Intracatheter PRN Margree Gimbel, MD       vinCRIStine (ONCOVIN) 2 mg in sodium chloride 0.9 % 50 mL chemo infusion  2 mg Intravenous Once Saveah Bahar, MD         VITALS:   Blood  pressure (!) 173/74, pulse 88, temperature 97.8 F (36.6 C), temperature source Oral, resp. rate 18, height 5\' 9"  (1.753 m), weight 175 lb 9.6 oz (79.7 kg), SpO2 96%.  Wt Readings from Last 3 Encounters:  07/19/23 175 lb 9.6 oz (79.7 kg)  06/28/23 169 lb 4.8 oz (76.8 kg)  06/16/23 166 lb 8 oz (75.5 kg)    Body mass index is 25.93 kg/m.  Performance status (ECOG): 1 - Symptomatic but completely ambulatory  PHYSICAL EXAM:   Physical Exam Constitutional:      General: He is not in acute distress.    Appearance: Normal appearance.  HENT:     Head: Normocephalic and atraumatic.  Eyes:     General: No scleral icterus.    Conjunctiva/sclera: Conjunctivae normal.  Cardiovascular:     Rate and Rhythm: Normal rate and regular rhythm.     Heart sounds: Normal heart sounds.  Pulmonary:     Effort: Pulmonary effort is normal.     Breath sounds: Normal breath sounds.  Abdominal:     General: There is no distension.  Musculoskeletal:     Right lower leg: No edema.     Left lower leg: No edema.  Lymphadenopathy:     Cervical: Cervical adenopathy (much improved) present.  Neurological:     General: No focal deficit present.     Mental Status: He is alert and oriented to person, place, and time.  Psychiatric:     Comments: In good spirits today      LABORATORY DATA:   I have reviewed the data as  listed.  Results for orders placed or performed in visit on 07/19/23  CBC with Differential (Cancer Center Only)  Result Value Ref Range   WBC Count 7.9 4.0 - 10.5 K/uL   RBC 3.38 (L) 4.22 - 5.81 MIL/uL   Hemoglobin 10.5 (L) 13.0 - 17.0 g/dL   HCT 40.9 (L) 81.1 - 91.4 %   MCV 95.0 80.0 - 100.0 fL   MCH 31.1 26.0 - 34.0 pg   MCHC 32.7 30.0 - 36.0 g/dL   RDW 78.2 (H) 95.6 - 21.3 %   Platelet Count 285 150 - 400 K/uL   nRBC 0.3 (H) 0.0 - 0.2 %   Neutrophils Relative % 89 %   Neutro Abs 7.0 1.7 - 7.7 K/uL   Lymphocytes Relative 6 %   Lymphs Abs 0.5 (L) 0.7 - 4.0 K/uL   Monocytes Relative 2 %   Monocytes Absolute 0.2 0.1 - 1.0 K/uL   Eosinophils Relative 1 %   Eosinophils Absolute 0.1 0.0 - 0.5 K/uL   Basophils Relative 1 %   Basophils Absolute 0.1 0.0 - 0.1 K/uL   Immature Granulocytes 1 %   Abs Immature Granulocytes 0.08 (H) 0.00 - 0.07 K/uL  CMP (Cancer Center only)  Result Value Ref Range   Sodium 140 135 - 145 mmol/L   Potassium 4.0 3.5 - 5.1 mmol/L   Chloride 108 98 - 111 mmol/L   CO2 24 22 - 32 mmol/L   Glucose, Bld 126 (H) 70 - 99 mg/dL   BUN 24 (H) 8 - 23 mg/dL   Creatinine 0.86 5.78 - 1.24 mg/dL   Calcium 9.6 8.9 - 46.9 mg/dL   Total Protein 8.0 6.5 - 8.1 g/dL   Albumin 4.5 3.5 - 5.0 g/dL   AST 20 15 - 41 U/L   ALT 21 0 - 44 U/L   Alkaline Phosphatase 68 38 - 126 U/L   Total Bilirubin  0.4 0.0 - 1.2 mg/dL   GFR, Estimated >21 >30 mL/min   Anion gap 8 5 - 15     RADIOGRAPHIC STUDIES:  I have personally reviewed the radiological images as listed and agree with the findings in the report.  No results found.   CODE STATUS:  Code Status History     Date Active Date Inactive Code Status Order ID Comments User Context   05/29/2023 0850 05/30/2023 2252 Full Code [id number]  Maryln Gottron, MD ED   05/17/2023 1906 05/24/2023 1834 Full Code [id number]  Rometta Emery, MD Inpatient   05/17/2023 1532 05/17/2023 1906 Full Code [id number]  Nolberto Hanlon, MD ED    05/11/2023 2024 05/12/2023 2308 Full Code [id number]  Hillary Bow, DO Inpatient   07/01/2018 1624 07/03/2018 1537 Full Code [id number]  Dorcas Carrow, MD Inpatient   02/10/2014 2054 02/12/2014 0031 Full Code [id number]  Christen Bame, MD Inpatient    Questions for Most Recent Historical Code Status (Order [id number])     Question Answer   By: Consent: discussion documented in EHR            Orders Placed This Encounter  Procedures   NM PET Image Restag (PS) Skull Base To Thigh    Standing Status:   Future    Expected Date:   08/02/2023    Expiration Date:   07/18/2024    If indicated for the ordered procedure, I authorize the administration of a radiopharmaceutical per Radiology protocol:   Yes    Preferred imaging location?:   [place]   Lactate dehydrogenase    Standing Status:   Future    Expected Date:   08/09/2023    Expiration Date:   08/08/2024   Uric acid    Standing Status:   Future    Expected Date:   08/09/2023    Expiration Date:   08/08/2024   CBC with Differential (Cancer Center Only)    Standing Status:   Future    Expected Date:   08/30/2023    Expiration Date:   08/29/2024   CMP (Cancer Center only)    Standing Status:   Future    Expected Date:   08/30/2023    Expiration Date:   08/29/2024   CBC with Differential (Cancer Center Only)    Standing Status:   Future    Expected Date:   09/20/2023    Expiration Date:   09/19/2024   CMP (Cancer Center only)    Standing Status:   Future    Expected Date:   09/20/2023    Expiration Date:   09/19/2024     Future Appointments  Date Time Provider Department Center  07/21/2023  8:45 AM [facility] LAB [facility]  07/21/2023  2:15 PM [facility] FLUSH [facility] None  08/09/2023 11:00 AM [facility] FLUSH [facility] None  08/09/2023 11:40 AM Vashaun Osmon, MD [facility] None  08/09/2023 12:30 PM [facility] INFUSION [facility] None  08/11/2023  2:15 PM [facility] FLUSH [facility] None  08/23/2023  9:30 AM Comer, Belia Heman,  MD [facility]  09/12/2023 11:00 AM Arnaldo Natal, NP [facility]      This document was completed utilizing speech recognition software. Grammatical errors, random word insertions, pronoun errors, and incomplete sentences are an occasional consequence of this system due to software limitations, ambient noise, and hardware issues. Any formal questions or concerns about the content, text or information contained within the body of this dictation should be directly addressed to  the provider for clarification.

## 2023-07-19 NOTE — Progress Notes (Signed)
Rituxan started at 13ml/h x 30 min. Increased to 200 mg/h till finished.

## 2023-07-19 NOTE — Assessment & Plan Note (Signed)
-   Neutropenia has resolved now.  White count is normal with ANC of 7000.  Will continue Neulasta with each cycle.  -Advised patient to avoid exposure to sick individuals due to lowered immunity.

## 2023-07-19 NOTE — Assessment & Plan Note (Signed)
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 FISH testing positive for BCL6 and negative for Bcl-2 and C-MYC.  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 Associated cervical, upper abdominal, and mesenteric nodal implants. Osseous involvement in the left sacrum/pelvis.   -Previously I discussed PET/CT findings with the patient, his sister and also showed them the images.  This will serve as a good baseline.  -As patient was complaining of recent onset headaches and tremors, we obtained CSF analysis to make sure there is no lymphoma involvement.  Underwent LP on 06/08/2023.  Cytology from CSF fluid was negative.  Imaging studies showed no gross abnormalities intracranially.  -Hepatitis B core antibody positive and surface antigen negative, likely from past infection.  Previously I discussed his case with his infectious disease doctor Dr. Luciana Axe.  Since patient is on Symtuza, he does not need additional treatment for this. HBV DNA PCR was negative on 06/09/2023.  -He started cycle 1 of R-CHOP on 06/09/2023.  He has been tolerating chemotherapy well.  -Labs today showed white count of 7900 with ANC of 7000.  Hemoglobin stable at 10.5, platelet count stable at 285,000.  CMP unremarkable.  No dose-limiting toxicities.  -Due for cycle 3 of R-CHOP today.  Will proceed with chemotherapy without dose reductions.  He will receive Neulasta on day 3.  -We will monitor his CD4 count periodically.  We will have to hold chemotherapy for CD4 count of less than 50.  -RTC in 3 weeks for cycle 4 of chemotherapy.  Restaging PET scan will be obtained in approximately 2 weeks from now and we will discuss results on return visit.

## 2023-07-20 ENCOUNTER — Telehealth: Payer: Self-pay | Admitting: Oncology

## 2023-07-20 NOTE — Telephone Encounter (Signed)
Completed - Called patient to update patient with scheduled appointments. Patient is acceptable to all appointment times.

## 2023-07-21 ENCOUNTER — Other Ambulatory Visit: Payer: Self-pay

## 2023-07-21 ENCOUNTER — Other Ambulatory Visit: Payer: Medicare Other

## 2023-07-21 ENCOUNTER — Inpatient Hospital Stay: Payer: Medicare Other

## 2023-07-21 VITALS — BP 144/89 | HR 76 | Temp 97.9°F | Resp 20

## 2023-07-21 DIAGNOSIS — C8510 Unspecified B-cell lymphoma, unspecified site: Secondary | ICD-10-CM

## 2023-07-21 DIAGNOSIS — B20 Human immunodeficiency virus [HIV] disease: Secondary | ICD-10-CM

## 2023-07-21 DIAGNOSIS — Z5111 Encounter for antineoplastic chemotherapy: Secondary | ICD-10-CM | POA: Diagnosis not present

## 2023-07-21 MED ORDER — PEGFILGRASTIM-FPGK 6 MG/0.6ML ~~LOC~~ SOSY
6.0000 mg | PREFILLED_SYRINGE | Freq: Once | SUBCUTANEOUS | Status: AC
  Administered 2023-07-21: 6 mg via SUBCUTANEOUS
  Filled 2023-07-21: qty 0.6

## 2023-07-25 LAB — HIV-1 RNA QUANT-NO REFLEX-BLD
HIV 1 RNA Quant: <20 {copies}/mL — ABNORMAL HIGH
HIV-1 RNA Quant, Log: <1.3 {Log} — ABNORMAL HIGH

## 2023-07-28 ENCOUNTER — Encounter (HOSPITAL_COMMUNITY): Payer: Medicare Other

## 2023-08-02 ENCOUNTER — Encounter: Payer: Self-pay | Admitting: Oncology

## 2023-08-02 ENCOUNTER — Ambulatory Visit (HOSPITAL_COMMUNITY)
Admission: RE | Admit: 2023-08-02 | Discharge: 2023-08-02 | Disposition: A | Discharge status: Home / Self Care | Payer: Medicare Other | Source: Ambulatory Visit | Attending: Oncology | Admitting: Oncology

## 2023-08-02 DIAGNOSIS — C8510 Unspecified B-cell lymphoma, unspecified site: Secondary | ICD-10-CM | POA: Insufficient documentation

## 2023-08-02 LAB — GLUCOSE, CAPILLARY: Glucose-Capillary: 106 mg/dL — ABNORMAL HIGH (ref 70–99)

## 2023-08-02 MED ORDER — FLUDEOXYGLUCOSE F - 18 (FDG) INJECTION
8.7500 | Freq: Once | INTRAVENOUS | Status: AC
  Administered 2023-08-02: 8.75 via INTRAVENOUS

## 2023-08-07 ENCOUNTER — Telehealth: Payer: Self-pay

## 2023-08-07 NOTE — Telephone Encounter (Signed)
Returned patient's call with the following instructions from Dr. Arlana Pouch to treat diarrhea: Take Imodium as directed.

## 2023-08-08 MED FILL — Fosaprepitant Dimeglumine For IV Infusion 150 MG (Base Eq): INTRAVENOUS | Qty: 5 | Status: AC

## 2023-08-09 ENCOUNTER — Inpatient Hospital Stay: Payer: Medicare Other | Attending: Oncology

## 2023-08-09 ENCOUNTER — Inpatient Hospital Stay (HOSPITAL_BASED_OUTPATIENT_CLINIC_OR_DEPARTMENT_OTHER): Payer: Medicare Other | Attending: Oncology | Admitting: Oncology

## 2023-08-09 ENCOUNTER — Inpatient Hospital Stay: Payer: Medicare Other

## 2023-08-09 ENCOUNTER — Encounter: Payer: Self-pay | Admitting: Oncology

## 2023-08-09 VITALS — BP 137/93 | HR 79 | Temp 98.6°F | Resp 16 | Wt 173.8 lb

## 2023-08-09 DIAGNOSIS — C8510 Unspecified B-cell lymphoma, unspecified site: Secondary | ICD-10-CM

## 2023-08-09 DIAGNOSIS — F5109 Other insomnia not due to a substance or known physiological condition: Secondary | ICD-10-CM | POA: Insufficient documentation

## 2023-08-09 DIAGNOSIS — C8338 Diffuse large B-cell lymphoma, lymph nodes of multiple sites: Secondary | ICD-10-CM | POA: Insufficient documentation

## 2023-08-09 DIAGNOSIS — Z5111 Encounter for antineoplastic chemotherapy: Secondary | ICD-10-CM | POA: Insufficient documentation

## 2023-08-09 DIAGNOSIS — Z95828 Presence of other vascular implants and grafts: Secondary | ICD-10-CM

## 2023-08-09 DIAGNOSIS — B20 Human immunodeficiency virus [HIV] disease: Secondary | ICD-10-CM

## 2023-08-09 DIAGNOSIS — Z79899 Other long term (current) drug therapy: Secondary | ICD-10-CM | POA: Insufficient documentation

## 2023-08-09 DIAGNOSIS — Z5189 Encounter for other specified aftercare: Secondary | ICD-10-CM | POA: Diagnosis not present

## 2023-08-09 DIAGNOSIS — Z5112 Encounter for antineoplastic immunotherapy: Secondary | ICD-10-CM | POA: Insufficient documentation

## 2023-08-09 DIAGNOSIS — G4700 Insomnia, unspecified: Secondary | ICD-10-CM | POA: Diagnosis not present

## 2023-08-09 LAB — URIC ACID: Uric Acid, Serum: 3.5 mg/dL — ABNORMAL LOW (ref 3.7–8.6)

## 2023-08-09 LAB — CBC WITH DIFFERENTIAL (CANCER CENTER ONLY)
Abs Immature Granulocytes: 0.04 10*3/uL (ref 0.00–0.07)
Basophils Absolute: 0.1 10*3/uL (ref 0.0–0.1)
Basophils Relative: 1 %
Eosinophils Absolute: 0 10*3/uL (ref 0.0–0.5)
Eosinophils Relative: 0 %
HCT: 30 % — ABNORMAL LOW (ref 39.0–52.0)
Hemoglobin: 10 g/dL — ABNORMAL LOW (ref 13.0–17.0)
Immature Granulocytes: 1 %
Lymphocytes Relative: 7 %
Lymphs Abs: 0.5 10*3/uL — ABNORMAL LOW (ref 0.7–4.0)
MCH: 31.7 pg (ref 26.0–34.0)
MCHC: 33.3 g/dL (ref 30.0–36.0)
MCV: 95.2 fL (ref 80.0–100.0)
Monocytes Absolute: 0.2 10*3/uL (ref 0.1–1.0)
Monocytes Relative: 2 %
Neutro Abs: 6.9 10*3/uL (ref 1.7–7.7)
Neutrophils Relative %: 89 %
Platelet Count: 240 10*3/uL (ref 150–400)
RBC: 3.15 MIL/uL — ABNORMAL LOW (ref 4.22–5.81)
RDW: 17.4 % — ABNORMAL HIGH (ref 11.5–15.5)
WBC Count: 7.7 10*3/uL (ref 4.0–10.5)
nRBC: 0 % (ref 0.0–0.2)

## 2023-08-09 LAB — CMP (CANCER CENTER ONLY)
ALT: 20 U/L (ref 0–44)
AST: 18 U/L (ref 15–41)
Albumin: 4.4 g/dL (ref 3.5–5.0)
Alkaline Phosphatase: 77 U/L (ref 38–126)
Anion gap: 6 (ref 5–15)
BUN: 19 mg/dL (ref 8–23)
CO2: 24 mmol/L (ref 22–32)
Calcium: 9.5 mg/dL (ref 8.9–10.3)
Chloride: 107 mmol/L (ref 98–111)
Creatinine: 1.08 mg/dL (ref 0.61–1.24)
GFR, Estimated: >60 mL/min (ref >60)
Glucose, Bld: 118 mg/dL — ABNORMAL HIGH (ref 70–99)
Potassium: 3.9 mmol/L (ref 3.5–5.1)
Sodium: 137 mmol/L (ref 135–145)
Total Bilirubin: 0.4 mg/dL (ref 0.0–1.2)
Total Protein: 7.3 g/dL (ref 6.5–8.1)

## 2023-08-09 LAB — LACTATE DEHYDROGENASE: LDH: 178 U/L (ref 98–192)

## 2023-08-09 MED ORDER — SODIUM CHLORIDE 0.9 % IV SOLN
150.0000 mg | Freq: Once | INTRAVENOUS | Status: AC
  Administered 2023-08-09: 150 mg via INTRAVENOUS
  Filled 2023-08-09: qty 150

## 2023-08-09 MED ORDER — SODIUM CHLORIDE 0.9% FLUSH
10.0000 mL | INTRAVENOUS | Status: DC | PRN
  Administered 2023-08-09: 10 mL

## 2023-08-09 MED ORDER — ACETAMINOPHEN 325 MG PO TABS
650.0000 mg | ORAL_TABLET | Freq: Once | ORAL | Status: AC
  Administered 2023-08-09: 650 mg via ORAL
  Filled 2023-08-09: qty 2

## 2023-08-09 MED ORDER — SODIUM CHLORIDE 0.9 % IV SOLN
INTRAVENOUS | Status: DC
Start: 2023-08-09 — End: 2023-08-09

## 2023-08-09 MED ORDER — DIPHENHYDRAMINE HCL 25 MG PO CAPS
50.0000 mg | ORAL_CAPSULE | Freq: Once | ORAL | Status: AC
  Administered 2023-08-09: 50 mg via ORAL
  Filled 2023-08-09: qty 2

## 2023-08-09 MED ORDER — SODIUM CHLORIDE 0.9% FLUSH
10.0000 mL | Freq: Once | INTRAVENOUS | Status: AC
  Administered 2023-08-09: 10 mL

## 2023-08-09 MED ORDER — SODIUM CHLORIDE 0.9 % IV SOLN
750.0000 mg/m2 | Freq: Once | INTRAVENOUS | Status: AC
  Administered 2023-08-09: 1500 mg via INTRAVENOUS
  Filled 2023-08-09: qty 75

## 2023-08-09 MED ORDER — DOXORUBICIN HCL CHEMO IV INJECTION 2 MG/ML
50.0000 mg/m2 | Freq: Once | INTRAVENOUS | Status: AC
  Administered 2023-08-09: 94 mg via INTRAVENOUS
  Filled 2023-08-09: qty 47

## 2023-08-09 MED ORDER — DEXAMETHASONE SODIUM PHOSPHATE 10 MG/ML IJ SOLN
10.0000 mg | Freq: Once | INTRAMUSCULAR | Status: AC
  Administered 2023-08-09: 10 mg via INTRAVENOUS
  Filled 2023-08-09: qty 1

## 2023-08-09 MED ORDER — LORAZEPAM 1 MG PO TABS: 1.0000 mg | ORAL_TABLET | Freq: Every day | ORAL | 0 refills | Status: DC

## 2023-08-09 MED ORDER — PALONOSETRON HCL INJECTION 0.25 MG/5ML
0.2500 mg | Freq: Once | INTRAVENOUS | Status: AC
  Administered 2023-08-09: 0.25 mg via INTRAVENOUS
  Filled 2023-08-09: qty 5

## 2023-08-09 MED ORDER — HEPARIN SOD (PORK) LOCK FLUSH 100 UNIT/ML IV SOLN
500.0000 [IU] | Freq: Once | INTRAVENOUS | Status: AC | PRN
  Administered 2023-08-09: 500 [IU]

## 2023-08-09 MED ORDER — VINCRISTINE SULFATE CHEMO INJECTION 1 MG/ML
2.0000 mg | Freq: Once | INTRAVENOUS | Status: AC
  Administered 2023-08-09: 2 mg via INTRAVENOUS
  Filled 2023-08-09: qty 2

## 2023-08-09 MED ORDER — SODIUM CHLORIDE 0.9 % IV SOLN
375.0000 mg/m2 | Freq: Once | INTRAVENOUS | Status: AC
  Administered 2023-08-09: 700 mg via INTRAVENOUS
  Filled 2023-08-09: qty 50

## 2023-08-09 NOTE — Assessment & Plan Note (Addendum)
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 FISH testing positive for BCL6 and negative for Bcl-2 and C-MYC.  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 Associated cervical, upper abdominal, and mesenteric nodal implants. Osseous involvement in the left sacrum/pelvis.   -Previously I discussed PET/CT findings with the patient, his sister and also showed them the images.  This will serve as a good baseline.  -As patient was complaining of recent onset headaches and tremors, we obtained CSF analysis to make sure there is no lymphoma involvement.  Underwent LP on 06/08/2023.  Cytology from CSF fluid was negative.  Imaging studies showed no gross abnormalities intracranially.  -Hepatitis B core antibody positive and surface antigen negative, likely from past infection.  Previously I discussed his case with his infectious disease doctor Dr. Luciana Axe.  Since patient is on Symtuza, he does not need additional treatment for this. HBV DNA PCR was negative on 06/09/2023.  -He started chemotherapy with R-CHOP on 06/09/2023.  He has been tolerating chemotherapy well.  - After completion of 3 cycles of R-CHOP, restaging PET/CT on 08/02/2023 showed complete response to therapy, Deauville 1.  This is consistent with excellent response and plan is to continue R-CHOP for total of 6 cycles.  Today I reviewed PET images and compared to previous PET scan.  Also showed images to patient and his friend who was accompanying.  Discussed results over the phone with patient's sister.  -Labs today showed white count of 7700 with ANC of 6900.  Hemoglobin stable at 10.0, platelet count stable at 240,000.  CMP unremarkable.  No dose-limiting toxicities.  -Due for cycle 4  of R-CHOP today.  Will proceed with chemotherapy without dose reductions.  He will receive Neulasta on day 3.  -We will monitor his CD4 count periodically.  We will have to hold chemotherapy for CD4 count of less than 50.  -RTC in 3 weeks for cycle 5 of chemotherapy.  Restaging PET scan will be obtained again after completion of 6 cycles.  Uric acid within normal limits today.  He was advised to discontinue allopurinol.

## 2023-08-09 NOTE — Assessment & Plan Note (Signed)
Difficulty sleeping, likely exacerbated by prednisone. Requests sleep aid. - Sent prescription for Ativan to [facility] pharmacy

## 2023-08-09 NOTE — Patient Instructions (Addendum)
[facility] - A DEPT OF [facility]  Discharge Instructions: Thank you for choosing [facility] to provide your oncology and hematology care.   If you have a lab appointment with the Cancer Center, please go directly to the Cancer Center and check in at the registration area.   Wear comfortable clothing and clothing appropriate for easy access to any Portacath or PICC line.   We strive to give you quality time with your provider. You may need to reschedule your appointment if you arrive late (15 or more minutes).  Arriving late affects you and other patients whose appointments are after yours.  Also, if you miss three or more appointments without notifying the office, you may be dismissed from the clinic at the provider's discretion.      For prescription refill requests, have your pharmacy contact our office and allow 72 hours for refills to be completed.    Today you received the following chemotherapy and/or immunotherapy agents: Doxorubicin, Vincristine, Cyclophosphamide, Rituximab      To help prevent nausea and vomiting after your treatment, we encourage you to take your nausea medication as directed.  BELOW ARE SYMPTOMS THAT SHOULD BE REPORTED IMMEDIATELY: *FEVER GREATER THAN 100.4 F (38 C) OR HIGHER *CHILLS OR SWEATING *NAUSEA AND VOMITING THAT IS NOT CONTROLLED WITH YOUR NAUSEA MEDICATION *UNUSUAL SHORTNESS OF BREATH *UNUSUAL BRUISING OR BLEEDING *URINARY PROBLEMS (pain or burning when urinating, or frequent urination) *BOWEL PROBLEMS (unusual diarrhea, constipation, pain near the anus) TENDERNESS IN MOUTH AND THROAT WITH OR WITHOUT PRESENCE OF ULCERS (sore throat, sores in mouth, or a toothache) UNUSUAL RASH, SWELLING OR PAIN  UNUSUAL VAGINAL DISCHARGE OR ITCHING   Items with * indicate a potential emergency and should be followed up as soon as possible or go to the Emergency Department if any problems should occur.  Please show  the CHEMOTHERAPY ALERT CARD or IMMUNOTHERAPY ALERT CARD at check-in to the Emergency Department and triage nurse.  Should you have questions after your visit or need to cancel or reschedule your appointment, please contact [facility] - A DEPT OF [facility]  Dept: [phone]  and follow the prompts.  Office hours are 8:00 a.m. to 4:30 p.m. Monday - Friday. Please note that voicemails left after 4:00 p.m. may not be returned until the following business day.  We are closed weekends and major holidays. You have access to a nurse at all times for urgent questions. Please call the main number to the clinic Dept: [phone] and follow the prompts.   For any non-urgent questions, you may also contact your provider using MyChart. We now offer e-Visits for anyone 60 and older to request care online for non-urgent symptoms. For details visit [email or web address].   Also download the MyChart app! Go to the app store, search "MyChart", open the app, select [facility], and log in with your MyChart username and password.

## 2023-08-09 NOTE — Assessment & Plan Note (Signed)
-   Currently under reasonable control.  Has close follow-up with ID, Dr.Comer.  His HIV therapy was switched to [facility].    -He has an appointment coming up with Dr. Luciana Axe later this month.

## 2023-08-09 NOTE — Progress Notes (Signed)
[facility]  ONCOLOGY CLINIC PROGRESS NOTE   Patient Care Team: Pcp, No as PCP - General Roderic Scarce, LCSW as Counselor Comer, Belia Heman, MD as Consulting Physician (Infectious Diseases) Meryl Crutch, MD as Consulting Physician (Hematology and Oncology)  PATIENT NAME: Jordan Calderon   MR#: [id number] DOB: 07/21/1960  Date of visit: 08/09/2023   ASSESSMENT & PLAN:   Jordan Calderon is a 63 y.o. gentleman with past medical history of HIV disease, depression, hypothyroidism, recent syncopal episode in November 2024 for which he was hospitalized from 05/11/2023 until 05/12/2023 with grossly negative TTE and CT head workup, was admitted to the hospital again on 05/17/2023 after he presented with dizziness, headaches.  CT angiogram of the head and neck on 05/17/2023 showed large right nasopharyngeal skull base mass which was suboptimally evaluated but was worrisome for nasopharyngeal carcinoma.  He was admitted for further evaluation and management.  Eventual workup revealed large B-cell lymphoma, high-grade, with multiple liver lesions and biopsies from stomach positive for non-Hodgkin lymphoma.  Stage IV disease.  FISH testing positive for BCL6 and negative for Bcl-2 and C-MYC.  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 FISH testing positive for BCL6 and negative for Bcl-2 and C-MYC.  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 Associated cervical, upper abdominal, and mesenteric nodal implants. Osseous involvement in the left sacrum/pelvis.   -Previously I discussed PET/CT findings with the patient, his sister and also showed them the images.  This will serve as a good  baseline.  -As patient was complaining of recent onset headaches and tremors, we obtained CSF analysis to make sure there is no lymphoma involvement.  Underwent LP on 06/08/2023.  Cytology from CSF fluid was negative.  Imaging studies showed no gross abnormalities intracranially.  -Hepatitis B core antibody positive and surface antigen negative, likely from past infection.  Previously I discussed his case with his infectious disease doctor Dr. Luciana Axe.  Since patient is on Symtuza, he does not need additional treatment for this. HBV DNA PCR was negative on 06/09/2023.  -He started chemotherapy with R-CHOP on 06/09/2023.  He has been tolerating chemotherapy well.  - After completion of 3 cycles of R-CHOP, restaging PET/CT on 08/02/2023 showed complete response to therapy, Deauville 1.  This is consistent with excellent response and plan is to continue R-CHOP for total of 6 cycles.  Today I reviewed PET images and compared to previous PET scan.  Also showed images to patient and his friend who was accompanying.  Discussed results over the phone with patient's sister.  -Labs today showed white count of 7700 with ANC of 6900.  Hemoglobin stable at 10.0, platelet count stable at 240,000.  CMP unremarkable.  No dose-limiting toxicities.  -Due for cycle 4 of R-CHOP today.  Will proceed with chemotherapy without dose reductions.  He will receive Neulasta on day 3.  -We will monitor his CD4 count periodically.  We will have to hold chemotherapy for CD4 count of less than 50.  -RTC in 3 weeks for cycle 5 of chemotherapy.  Restaging PET scan will be obtained again after completion of 6 cycles.  Uric acid within normal limits today.  He was advised to discontinue allopurinol.  Human immunodeficiency virus (HIV) disease (HCC) - Currently under reasonable control.  Has close follow-up with ID, Dr.Comer.  His HIV therapy was switched to [facility].    -He has an appointment coming up with Dr. Luciana Axe later this  month.  Situational insomnia Difficulty sleeping, likely exacerbated by prednisone. Requests sleep aid. - Sent prescription for Ativan to [facility] pharmacy    I reviewed lab results and outside records for this visit and discussed relevant results with the patient. Diagnosis, plan of care and treatment options were also discussed in detail with the patient. Opportunity provided to ask questions and answers provided to his apparent satisfaction. Provided instructions to call our clinic with any problems, questions or concerns prior to return visit. I recommended to continue follow-up with PCP and sub-specialists. He verbalized understanding and agreed with the plan.   NCCN guidelines have been consulted in the planning of this patient's care.  I spent a total of 40 minutes during this encounter with the patient including review of chart and various tests results, discussions about plan of care and coordination of care plan.   Meryl Crutch, MD  08/09/2023 12:24 PM  [facility] - A DEPT OF [facility] [address] [place] [address] Dept: [phone] Dept Fax: [phone]    CHIEF COMPLAINT/ REASON FOR VISIT:   Recently diagnosed high-grade B-cell lymphoma, large cell type, stage IV disease with multiple liver lesions. FISH testing positive for BCL6 and negative for Bcl-2 and C-MYC.  Current Treatment: R-CHOP started from 06/09/2023.  INTERVAL HISTORY:    Discussed the use of AI scribe software for clinical note transcription with the patient, who gave verbal consent to proceed.   He was accompanied by his friend today, who is very supportive.  His sister lives in [place], [place] and usually accompanies him to appointments but she could not come today because of the weather.  He reports diarrhea after the PET scan, which has since resolved with Imodium. He also reports sleep issues, which he attributes to the  prednisone. He requests a sleep aid. He has a runny nose without cold symptoms, which he manages with Claritin during chemotherapy. He inquires about taking Claritin between chemotherapy sessions. He also reports knee pain, which has improved, and ear ringing.   I have reviewed the past medical history, past surgical history, social history and family history with the patient and they are unchanged from previous note.  HISTORY OF PRESENT ILLNESS:   63 y.o. gentleman with past medical history of HIV disease, depression, hypothyroidism, recent syncopal episode in November 2024 for which he was hospitalized from 05/11/2023 until 05/12/2023 with grossly negative TTE and CT head workup, was admitted to the hospital again on 05/17/2023 after he presented with dizziness, headaches.  CT angiogram of the head and neck on 05/17/2023 showed large right nasopharyngeal skull base mass which was suboptimally evaluated but was worrisome for nasopharyngeal carcinoma.  He was admitted for further evaluation and management.  Eventual workup revealed large B-cell lymphoma, high-grade, with multiple liver lesions and biopsies from stomach positive for non-Hodgkin lymphoma.  Stage IV disease.   ENT evaluated him and performed biopsy of right nasopharyngeal mass on 05/19/2023.  We were consulted for any additional recommendations.   MR angiogram of the head without IV contrast on 05/11/2023 showed 3 mm outpouching extending posteriorly and laterally from the left ICA terminus, potentially reflecting a vascular infundibulum versus aneurysm.  Otherwise normal intracranial MRA.   CT soft tissue of the neck on 05/18/2023 showed  large right nasopharyngeal mass that extended inferiorly to the level of the greater horn of the hyoid bone on the right.  This mass involves right-sided adenoid and palatine tonsils, right parapharyngeal space, right masticator space, right parotid space.  Superiorly this most extended to the level of  foreman ovale bilaterally.  A 9 mm hypodense lesion in the left parotid gland was noted, unclear if necrotic lymph node versus intraparotid cyst.  On 05/19/2023, ENT performed excision of nasopharyngeal mass.  Pathology showed respiratory mucosa with chronic sinusitis and prominent secondary follicle/germinal center formation consistent with the presence of tonsillar type tissue.  Negative for malignancy.  On 05/20/2023, CT scan of the chest showed multiple liver lesions, concerning for metastatic disease, at least 15 lesions.  It also showed upper abdominal abnormal lymph nodes.  On 05/23/2023, CT abdomen and pelvis showed similar findings in the liver and gastrohepatic lymph nodes.  Focal wall thickening of the mid gastric lumen with also greater at the lesser curvature of stomach.  Left lung base lung nodule measuring 6 mm.  On 05/22/2023, GI performed upper endoscopy for further evaluation of anemia.  Esophageal mucosal changes classified as Barrett's stage C2-M3.  LA grade D esophagitis.  Esophageal plaques were found, consistent with candidiasis.  Nonbleeding gastric ulcer which was biopsied.  Pathology from stomach ulcer biopsy came back positive for non-Hodgkin's lymphoma, large cell type.  Esophageal biopsy showed Barrett's esophagus.  On 05/23/2023, he underwent core needle biopsy of liver lesions.  Pathology showed high-grade B-cell lymphoma, large cell type.  Ki-67 was more than 50%. FISH testing positive for BCL6 and negative for Bcl-2 and C-MYC.  Staging PET/CT performed on 06/05/2023: 5.4 cm right nasopharyngeal mass, likely corresponding to the patient's known lymphoma.Multifocal pulmonary lymphoma.  Innumerable hepatic metastases. Associated cervical, upper abdominal, and mesenteric nodal implants. Osseous involvement in the left sacrum/pelvis.  Started chemotherapy with R-CHOP from 06/09/2023.  After completion of 3 cycles of R-CHOP, restaging PET/CT on 08/02/2023 showed complete  response to therapy, Deauville 1.  This is consistent with excellent response and plan is to continue R-CHOP for total of 6 cycles.  Oncology History  High grade B-cell lymphoma (HCC)  05/31/2023 Initial Diagnosis   DLBCL (diffuse large B cell lymphoma) (HCC)   06/05/2023 Cancer Staging   Staging form: Hodgkin and Non-Hodgkin Lymphoma, AJCC 8th Edition - Clinical: Stage IV (Diffuse large B-cell lymphoma) - Signed by Meryl Crutch, MD on 06/05/2023 Stage prefix: Initial diagnosis   06/09/2023 -  Chemotherapy   Patient is on Treatment Plan : NON-HODGKINS LYMPHOMA R-CHOP q21d         REVIEW OF SYSTEMS:   Review of Systems - Oncology  All other pertinent systems were reviewed with the patient and are negative.  ALLERGIES: He is allergic to sulfonamide derivatives.  MEDICATIONS:  Current Outpatient Medications  Medication Sig Dispense Refill   LORazepam (ATIVAN) 1 MG tablet Take 1 tablet (1 mg total) by mouth at bedtime. 30 tablet 0   acyclovir (ZOVIRAX) 400 MG tablet Take 1 tablet (400 mg total) by mouth daily. 30 tablet 3   allopurinol (ZYLOPRIM) 300 MG tablet Take 1 tablet (300 mg total) by mouth daily. 30 tablet 3   bictegravir-emtricitabine-tenofovir AF (BIKTARVY) 50-200-25 MG TABS tablet Take 1 tablet by mouth daily. 30 tablet 11   calcium carbonate (TUMS - DOSED IN MG ELEMENTAL CALCIUM) 500 MG chewable tablet Chew 1 tablet by mouth 3 (three) times daily with meals. (Patient not taking: Reported on 06/28/2023)  dapsone 100 MG tablet Take 1 tablet (100 mg total) by mouth daily. (Patient taking differently: Take 100 mg by mouth daily with breakfast.) 30 tablet 5   fluconazole (DIFLUCAN) 200 MG tablet Take 1 tablet (200 mg total) by mouth daily. 30 tablet 5   lidocaine-prilocaine (EMLA) cream Apply to affected area once 30 g 3   morphine (MSIR) 15 MG tablet Take 1 tablet (15 mg total) by mouth every 6 (six) hours as needed for severe pain (pain score 7-10). 30 tablet 0    ondansetron (ZOFRAN) 8 MG tablet Take 1 tablet (8 mg total) by mouth every 8 (eight) hours as needed for nausea or vomiting. Start on the third day after cyclophosphamide chemotherapy. 30 tablet 1   pantoprazole (PROTONIX) 40 MG tablet Take 1 tablet (40 mg total) by mouth 2 (two) times daily before a meal. 60 tablet 5   predniSONE (DELTASONE) 20 MG tablet Take 5 tablets (100 mg total) by mouth daily. Take with food on days 1-5 of chemotherapy. 25 tablet 5   prochlorperazine (COMPAZINE) 10 MG tablet Take 1 tablet (10 mg total) by mouth every 6 (six) hours as needed for nausea or vomiting. 30 tablet 6   traMADol (ULTRAM) 50 MG tablet Take 1 tablet (50 mg total) by mouth 3 (three) times daily as needed for moderate pain (pain score 4-6). 30 tablet 0   No current facility-administered medications for this visit.   Facility-Administered Medications Ordered in Other Visits  Medication Dose Route Frequency Provider Last Rate Last Admin   0.9 %  sodium chloride infusion   Intravenous Continuous Tyson Masin, MD 10 mL/hr at 08/09/23 1154 New Bag at 08/09/23 1154   cyclophosphamide (CYTOXAN) 1,500 mg in sodium chloride 0.9 % 250 mL chemo infusion  750 mg/m2 (Treatment Plan Recorded) Intravenous Once Andretta Ergle, MD       DOXOrubicin (ADRIAMYCIN) chemo injection 94 mg  50 mg/m2 (Treatment Plan Recorded) Intravenous Once Elizabethanne Lusher, MD       fosaprepitant (EMEND) 150 mg in sodium chloride 0.9 % 145 mL IVPB  150 mg Intravenous Once Kourtnee Lahey, MD 450 mL/hr at 08/09/23 1209 150 mg at 08/09/23 1209   heparin lock flush 100 unit/mL  500 Units Intracatheter Once PRN Aalani Aikens, MD       riTUXimab-pvvr (RUXIENCE) 700 mg in sodium chloride 0.9 % 180 mL infusion  375 mg/m2 (Treatment Plan Recorded) Intravenous Once Basilio Meadow, MD       sodium chloride flush (NS) 0.9 % injection 10 mL  10 mL Intracatheter PRN Daymein Nunnery, MD       vinCRIStine (ONCOVIN) 2 mg in sodium chloride 0.9 % 50 mL chemo  infusion  2 mg Intravenous Once Haedyn Breau, MD         VITALS:   There were no vitals taken for this visit.  Wt Readings from Last 3 Encounters:  08/09/23 173 lb 12 oz (78.8 kg)  07/19/23 175 lb 9.6 oz (79.7 kg)  06/28/23 169 lb 4.8 oz (76.8 kg)    There is no height or weight on file to calculate BMI.   Onc Performance Status - 08/09/23 1100       ECOG Perf Status   ECOG Perf Status Restricted in physically strenuous activity but ambulatory and able to carry out work of a light or sedentary nature, e.g., light house work, office work      KPS SCALE   KPS % SCORE Able to carry on normal activity, minor s/s  of disease              PHYSICAL EXAM:   Physical Exam Constitutional:      General: He is not in acute distress.    Appearance: Normal appearance.  HENT:     Head: Normocephalic and atraumatic.  Eyes:     General: No scleral icterus.    Conjunctiva/sclera: Conjunctivae normal.  Cardiovascular:     Rate and Rhythm: Normal rate and regular rhythm.     Heart sounds: Normal heart sounds.  Pulmonary:     Effort: Pulmonary effort is normal.     Breath sounds: Normal breath sounds.  Abdominal:     General: There is no distension.  Musculoskeletal:     Right lower leg: No edema.     Left lower leg: No edema.  Lymphadenopathy:     Cervical: Cervical adenopathy: resolved.  Neurological:     General: No focal deficit present.     Mental Status: He is alert and oriented to person, place, and time.  Psychiatric:     Comments: In good spirits today      LABORATORY DATA:   I have reviewed the data as listed.  Results for orders placed or performed in visit on 08/09/23  CBC with Differential (Cancer Center Only)  Result Value Ref Range   WBC Count 7.7 4.0 - 10.5 K/uL   RBC 3.15 (L) 4.22 - 5.81 MIL/uL   Hemoglobin 10.0 (L) 13.0 - 17.0 g/dL   HCT 47.8 (L) 29.5 - 62.1 %   MCV 95.2 80.0 - 100.0 fL   MCH 31.7 26.0 - 34.0 pg   MCHC 33.3 30.0 - 36.0 g/dL    RDW 30.8 (H) 65.7 - 15.5 %   Platelet Count 240 150 - 400 K/uL   nRBC 0.0 0.0 - 0.2 %   Neutrophils Relative % 89 %   Neutro Abs 6.9 1.7 - 7.7 K/uL   Lymphocytes Relative 7 %   Lymphs Abs 0.5 (L) 0.7 - 4.0 K/uL   Monocytes Relative 2 %   Monocytes Absolute 0.2 0.1 - 1.0 K/uL   Eosinophils Relative 0 %   Eosinophils Absolute 0.0 0.0 - 0.5 K/uL   Basophils Relative 1 %   Basophils Absolute 0.1 0.0 - 0.1 K/uL   Immature Granulocytes 1 %   Abs Immature Granulocytes 0.04 0.00 - 0.07 K/uL  CMP (Cancer Center only)  Result Value Ref Range   Sodium 137 135 - 145 mmol/L   Potassium 3.9 3.5 - 5.1 mmol/L   Chloride 107 98 - 111 mmol/L   CO2 24 22 - 32 mmol/L   Glucose, Bld 118 (H) 70 - 99 mg/dL   BUN 19 8 - 23 mg/dL   Creatinine 8.46 9.62 - 1.24 mg/dL   Calcium 9.5 8.9 - 95.2 mg/dL   Total Protein 7.3 6.5 - 8.1 g/dL   Albumin 4.4 3.5 - 5.0 g/dL   AST 18 15 - 41 U/L   ALT 20 0 - 44 U/L   Alkaline Phosphatase 77 38 - 126 U/L   Total Bilirubin 0.4 0.0 - 1.2 mg/dL   GFR, Estimated >84 >13 mL/min   Anion gap 6 5 - 15  Lactate dehydrogenase  Result Value Ref Range   LDH 178 98 - 192 U/L  Uric acid  Result Value Ref Range   Uric Acid, Serum 3.5 (L) 3.7 - 8.6 mg/dL     RADIOGRAPHIC STUDIES:  I have personally reviewed the radiological images as listed  and agree with the findings in the report.  NM PET Image Restag (PS) Skull Base To Thigh Result Date: 08/09/2023 CLINICAL DATA:  Subsequent treatment strategy for diffuse large B-cell lymphoma. EXAM: NUCLEAR MEDICINE PET SKULL BASE TO THIGH TECHNIQUE: 8.7 mCi F-18 FDG was injected intravenously. Full-ring PET imaging was performed from the skull base to thigh after the radiotracer. CT data was obtained and used for attenuation correction and anatomic localization. Fasting blood glucose: 106 mg/dl COMPARISON:  PET-CT 52/84/1324 FINDINGS: NECK: Complete resolution of the hypermetabolic mass centered in the posterior RIGHT hypopharynx. Interval  resolution of the bilateral hypermetabolic level 2 lymph nodes. No hypermetabolic mass remains the hyper pharynx. No hypermetabolic pulmonary nodules. Incidental CT findings: None. CHEST: Interval resolution of the previous seen hypermetabolic mass along the mediastinal border in the LEFT upper lobe. No residual measurable mass or metabolic activity. Additionally discrete hypermetabolic pulmonary nodules in the RIGHT lower lobe have resolved. Hypermetabolic mediastinal adenopathy is resolved. No measurable disease remains. Incidental CT findings: None. ABDOMEN/PELVIS: Interval complete resolution of the previously seen hypermetabolic lesions within the liver. No abnormal metabolic activity above background physiologic liver activity. The spleen is normal volume and normal metabolic activity. Previously identified intense hypermetabolic activity throughout the stomach has resolved completely. Intense metabolic scattered throughout the colon small bowel. Intense activity in the cecum associated with fluid within the cecum. Favored physiologic bowel activity. Incidental CT findings: None. SKELETON: radiotracer activity posterior LEFT iliac bone has resolved. There is underlying uniform increase in marrow activity throughout the pelvis and spine. Incidental CT findings: None. IMPRESSION: 1. Complete response to therapy.  Deauville 1 2. Interval resolution of hypermetabolic mass in the RIGHT hypopharynx. 3. Interval resolution of hypermetabolic adenopathy in the neck. 4. Interval resolution of hypermetabolic mass in the LEFT upper lobe. 5. Interval resolution of hypermetabolic pulmonary nodules in the RIGHT lower lobe. 6. Interval resolution of hypermetabolic liver lesions. 7. Interval resolution of hypermetabolic activity in the stomach. 8. Interval resolution of hypermetabolic activity in the LEFT iliac bone. 9. Diffuse increase in marrow activity throughout the spine and pelvis is favored reactive marrow hyperplasia  (GCSF type response). Electronically Signed   By: Genevive Bi M.D.   On: 08/09/2023 11:04     CODE STATUS:  Code Status History     Date Active Date Inactive Code Status Order ID Comments User Context   05/29/2023 0850 05/30/2023 2252 Full Code [id number]  Maryln Gottron, MD ED   05/17/2023 1906 05/24/2023 1834 Full Code [id number]  Rometta Emery, MD Inpatient   05/17/2023 1532 05/17/2023 1906 Full Code [id number]  Nolberto Hanlon, MD ED   05/11/2023 2024 05/12/2023 2308 Full Code [id number]  Hillary Bow, DO Inpatient   07/01/2018 1624 07/03/2018 1537 Full Code [id number]  Dorcas Carrow, MD Inpatient   02/10/2014 2054 02/12/2014 0031 Full Code [id number]  Christen Bame, MD Inpatient    Questions for Most Recent Historical Code Status (Order [id number])     Question Answer   By: Consent: discussion documented in EHR            No orders of the defined types were placed in this encounter.    Future Appointments  Date Time Provider Department Center  08/11/2023  2:15 PM [facility] FLUSH [facility] None  08/23/2023  9:30 AM Comer, Belia Heman, MD [facility]  09/01/2023  9:45 AM [facility] FLUSH [facility] None  09/01/2023 10:15 AM Masey Scheiber, Archie Patten, MD [facility] None  09/01/2023 11:30 AM [facility] INFUSION [facility] None  09/04/2023  1:15 PM [facility] FLUSH [facility] None  09/12/2023 11:00 AM Arnaldo Natal, NP [facility]  09/22/2023 10:30 AM [facility] FLUSH [facility] None  09/22/2023 11:00 AM Jaelynne Hockley, MD [facility] None  09/22/2023 12:00 PM [facility] INFUSION [facility] None  09/25/2023  2:00 PM [facility] FLUSH [facility] None      This document was completed utilizing speech recognition software. Grammatical errors, random word insertions, pronoun errors, and incomplete sentences are an occasional consequence of this system due to software limitations, ambient noise, and hardware issues. Any formal questions or concerns about the content,  text or information contained within the body of this dictation should be directly addressed to the provider for clarification.

## 2023-08-11 ENCOUNTER — Inpatient Hospital Stay: Payer: Medicare Other

## 2023-08-11 VITALS — BP 139/75 | HR 75 | Temp 97.6°F | Resp 16

## 2023-08-11 DIAGNOSIS — C8510 Unspecified B-cell lymphoma, unspecified site: Secondary | ICD-10-CM

## 2023-08-11 DIAGNOSIS — Z5111 Encounter for antineoplastic chemotherapy: Secondary | ICD-10-CM | POA: Diagnosis not present

## 2023-08-11 MED ORDER — PEGFILGRASTIM-FPGK 6 MG/0.6ML ~~LOC~~ SOSY
6.0000 mg | PREFILLED_SYRINGE | Freq: Once | SUBCUTANEOUS | Status: AC
  Administered 2023-08-11: 6 mg via SUBCUTANEOUS
  Filled 2023-08-11: qty 0.6

## 2023-08-15 ENCOUNTER — Other Ambulatory Visit: Payer: Self-pay | Admitting: Oncology

## 2023-08-15 ENCOUNTER — Telehealth: Payer: Self-pay

## 2023-08-15 DIAGNOSIS — R197 Diarrhea, unspecified: Secondary | ICD-10-CM

## 2023-08-15 MED ORDER — DIPHENOXYLATE-ATROPINE 2.5-0.025 MG PO TABS: 1.0000 | ORAL_TABLET | Freq: Four times a day (QID) | ORAL | 1 refills | Status: DC | PRN

## 2023-08-15 NOTE — Telephone Encounter (Signed)
 Responded to VM left by family member, [name], that patient continued to have diarrhea with increasing episodes including some frothy stools. Also, reported that patient had 10 loose stools last night and gone through a whole bottle of Immodium.   Dr. Arlana Pouch aware with Rx of Lomotil sent to patient's pharmacy. Patient was notified by this writer of the Rx and to contact us if this does not help.

## 2023-08-23 ENCOUNTER — Encounter: Payer: Self-pay | Admitting: Internal Medicine

## 2023-08-23 ENCOUNTER — Other Ambulatory Visit: Payer: Self-pay

## 2023-08-23 ENCOUNTER — Ambulatory Visit (INDEPENDENT_AMBULATORY_CARE_PROVIDER_SITE_OTHER): Payer: Medicare Other | Admitting: Internal Medicine

## 2023-08-23 VITALS — BP 131/87 | HR 88 | Temp 97.8°F | Ht 69.0 in | Wt 172.0 lb

## 2023-08-23 DIAGNOSIS — T451X5A Adverse effect of antineoplastic and immunosuppressive drugs, initial encounter: Secondary | ICD-10-CM | POA: Diagnosis not present

## 2023-08-23 DIAGNOSIS — D701 Agranulocytosis secondary to cancer chemotherapy: Secondary | ICD-10-CM | POA: Diagnosis not present

## 2023-08-23 DIAGNOSIS — B20 Human immunodeficiency virus [HIV] disease: Secondary | ICD-10-CM | POA: Diagnosis present

## 2023-08-23 NOTE — Assessment & Plan Note (Signed)
 He is now on Biktarvy due to drug interactions and doing well on this.  He has no complaints with this and we will continue.  He did have a recent viral load that was not detected in January and I will recheck this today.  He also is on dapsone for prophylaxis since he is getting chemotherapy.  I will have him return in about 3 to 4 months otherwise.  I have personally spent 30 minutes involved in face-to-face and non-face-to-face activities for this patient on the day of the visit. Professional time spent includes the following activities: Preparing to see the patient (review of tests), Obtaining and/or reviewing separately obtained history (admission/discharge record), Performing a medically appropriate examination and/or evaluation , Ordering medications/tests/procedures, referring and communicating with other health care professionals, Documenting clinical information in the EMR, Independently interpreting results (not separately reported), Communicating results to the patient/family/caregiver, Counseling and educating the patient/family/caregiver and Care coordination (not separately reported).

## 2023-08-23 NOTE — Progress Notes (Signed)
   Subjective:    Patient ID: Jordan Calderon, male    DOB: 03/02/1961, 63 y.o.   MRN: [id number]  HPI Jordan Calderon is here for follow-up of HIV. He has been on Symtuza but is to [place] with recent medication interactions.  He is undergoing treatment for lymphoma.  He otherwise denies any missed doses.  He also has had a positive anal Pap test and is deferring for now follow-up with general surgery pending his completion of chemotherapy and feeling better.  He otherwise has no significant complaints today.   Review of Systems  Constitutional:  Negative for fatigue and fever.  Gastrointestinal:  Negative for diarrhea.  Skin:  Negative for rash.       Objective:   Physical Exam Eyes:     General: No scleral icterus. Pulmonary:     Effort: Pulmonary effort is normal.  Neurological:     Mental Status: He is alert.    SH: no tobacco       Assessment & Plan:

## 2023-08-24 LAB — T-HELPER CELL (CD4) - (RCID CLINIC ONLY)
CD4 % Helper T Cell: 10 % — ABNORMAL LOW (ref 33–65)
CD4 T Cell Abs: 39 /uL — ABNORMAL LOW (ref 400–1790)

## 2023-08-25 ENCOUNTER — Telehealth: Payer: Self-pay

## 2023-08-25 ENCOUNTER — Other Ambulatory Visit: Payer: Self-pay | Admitting: Oncology

## 2023-08-25 DIAGNOSIS — C8510 Unspecified B-cell lymphoma, unspecified site: Secondary | ICD-10-CM

## 2023-08-25 NOTE — Telephone Encounter (Signed)
 Returned Gina's VM request for concerns around continued diarrhea episodes uncontrolled. She reported that the Lomotil was not helping. Dr. Arlana Pouch was made aware of the continued diarrhea and he did follow up via phone call with the matter per Dr. Zenda Alpers secure chat msg.

## 2023-08-25 NOTE — Telephone Encounter (Signed)
 Returned call to [place] after following up on her concern with the following lab results:  CD4 ct (HIV) of 39. [name] was updated that Dr. Arlana Pouch would collect another lab for this during patient's next visit and will go from there. [name] agreed with this plan.

## 2023-08-26 LAB — HIV-1 RNA QUANT-NO REFLEX-BLD
HIV 1 RNA Quant: 57 {copies}/mL — ABNORMAL HIGH
HIV-1 RNA Quant, Log: 1.76 {Log_copies}/mL — ABNORMAL HIGH

## 2023-08-30 ENCOUNTER — Ambulatory Visit: Payer: Medicare Other | Admitting: Gastroenterology

## 2023-08-31 MED FILL — Fosaprepitant Dimeglumine For IV Infusion 150 MG (Base Eq): INTRAVENOUS | Qty: 5 | Status: AC

## 2023-09-01 ENCOUNTER — Inpatient Hospital Stay: Payer: Medicare Other

## 2023-09-01 ENCOUNTER — Inpatient Hospital Stay: Payer: Medicare Other | Attending: Oncology

## 2023-09-01 ENCOUNTER — Inpatient Hospital Stay: Payer: Medicare Other | Admitting: Oncology

## 2023-09-01 ENCOUNTER — Encounter: Payer: Self-pay | Admitting: Oncology

## 2023-09-01 ENCOUNTER — Ambulatory Visit: Payer: Medicare Other

## 2023-09-01 VITALS — BP 128/84 | HR 77 | Temp 97.6°F | Resp 16 | Ht 69.0 in | Wt 175.8 lb

## 2023-09-01 VITALS — BP 131/88 | HR 72 | Temp 97.6°F | Resp 17

## 2023-09-01 DIAGNOSIS — R197 Diarrhea, unspecified: Secondary | ICD-10-CM | POA: Diagnosis not present

## 2023-09-01 DIAGNOSIS — Z5112 Encounter for antineoplastic immunotherapy: Secondary | ICD-10-CM | POA: Insufficient documentation

## 2023-09-01 DIAGNOSIS — C8338 Diffuse large B-cell lymphoma, lymph nodes of multiple sites: Secondary | ICD-10-CM | POA: Diagnosis not present

## 2023-09-01 DIAGNOSIS — C8510 Unspecified B-cell lymphoma, unspecified site: Secondary | ICD-10-CM | POA: Diagnosis not present

## 2023-09-01 DIAGNOSIS — B20 Human immunodeficiency virus [HIV] disease: Secondary | ICD-10-CM | POA: Diagnosis not present

## 2023-09-01 DIAGNOSIS — Z5189 Encounter for other specified aftercare: Secondary | ICD-10-CM | POA: Insufficient documentation

## 2023-09-01 DIAGNOSIS — G4700 Insomnia, unspecified: Secondary | ICD-10-CM | POA: Diagnosis not present

## 2023-09-01 DIAGNOSIS — Z5111 Encounter for antineoplastic chemotherapy: Secondary | ICD-10-CM | POA: Diagnosis present

## 2023-09-01 DIAGNOSIS — F5109 Other insomnia not due to a substance or known physiological condition: Secondary | ICD-10-CM | POA: Diagnosis not present

## 2023-09-01 DIAGNOSIS — Z95828 Presence of other vascular implants and grafts: Secondary | ICD-10-CM

## 2023-09-01 LAB — CBC WITH DIFFERENTIAL (CANCER CENTER ONLY)
Abs Immature Granulocytes: 0.02 10*3/uL (ref 0.00–0.07)
Basophils Absolute: 0.1 10*3/uL (ref 0.0–0.1)
Basophils Relative: 2 %
Eosinophils Absolute: 0 10*3/uL (ref 0.0–0.5)
Eosinophils Relative: 1 %
HCT: 28.1 % — ABNORMAL LOW (ref 39.0–52.0)
Hemoglobin: 9 g/dL — ABNORMAL LOW (ref 13.0–17.0)
Immature Granulocytes: 0 %
Lymphocytes Relative: 15 %
Lymphs Abs: 0.7 10*3/uL (ref 0.7–4.0)
MCH: 31.7 pg (ref 26.0–34.0)
MCHC: 32 g/dL (ref 30.0–36.0)
MCV: 98.9 fL (ref 80.0–100.0)
Monocytes Absolute: 0.7 10*3/uL (ref 0.1–1.0)
Monocytes Relative: 14 %
Neutro Abs: 3.3 10*3/uL (ref 1.7–7.7)
Neutrophils Relative %: 68 %
Platelet Count: 180 10*3/uL (ref 150–400)
RBC: 2.84 MIL/uL — ABNORMAL LOW (ref 4.22–5.81)
RDW: 15.7 % — ABNORMAL HIGH (ref 11.5–15.5)
WBC Count: 4.8 10*3/uL (ref 4.0–10.5)
nRBC: 0.4 % — ABNORMAL HIGH (ref 0.0–0.2)

## 2023-09-01 LAB — CMP (CANCER CENTER ONLY)
ALT: 24 U/L (ref 0–44)
AST: 23 U/L (ref 15–41)
Albumin: 4.1 g/dL (ref 3.5–5.0)
Alkaline Phosphatase: 73 U/L (ref 38–126)
Anion gap: 4 — ABNORMAL LOW (ref 5–15)
BUN: 26 mg/dL — ABNORMAL HIGH (ref 8–23)
CO2: 26 mmol/L (ref 22–32)
Calcium: 8.7 mg/dL — ABNORMAL LOW (ref 8.9–10.3)
Chloride: 111 mmol/L (ref 98–111)
Creatinine: 1.35 mg/dL — ABNORMAL HIGH (ref 0.61–1.24)
GFR, Estimated: 59 mL/min — ABNORMAL LOW (ref >60)
Glucose, Bld: 84 mg/dL (ref 70–99)
Potassium: 3.8 mmol/L (ref 3.5–5.1)
Sodium: 141 mmol/L (ref 135–145)
Total Bilirubin: 0.4 mg/dL (ref 0.0–1.2)
Total Protein: 6.9 g/dL (ref 6.5–8.1)

## 2023-09-01 LAB — T-HELPER CELLS (CD4) COUNT (NOT AT ARMC)
CD4 % Helper T Cell: 8 % — ABNORMAL LOW (ref 33–65)
CD4 T Cell Abs: 55 /uL — ABNORMAL LOW (ref 400–1790)

## 2023-09-01 MED ORDER — VINCRISTINE SULFATE CHEMO INJECTION 1 MG/ML
2.0000 mg | Freq: Once | INTRAVENOUS | Status: AC
  Administered 2023-09-01: 2 mg via INTRAVENOUS
  Filled 2023-09-01: qty 2

## 2023-09-01 MED ORDER — PALONOSETRON HCL INJECTION 0.25 MG/5ML
0.2500 mg | Freq: Once | INTRAVENOUS | Status: AC
  Administered 2023-09-01: 0.25 mg via INTRAVENOUS
  Filled 2023-09-01: qty 5

## 2023-09-01 MED ORDER — DEXAMETHASONE SODIUM PHOSPHATE 10 MG/ML IJ SOLN
10.0000 mg | Freq: Once | INTRAMUSCULAR | Status: AC
  Administered 2023-09-01: 10 mg via INTRAVENOUS
  Filled 2023-09-01: qty 1

## 2023-09-01 MED ORDER — LORAZEPAM 1 MG PO TABS: 1.0000 mg | ORAL_TABLET | Freq: Two times a day (BID) | ORAL | 0 refills | Status: DC | PRN

## 2023-09-01 MED ORDER — SODIUM CHLORIDE 0.9 % IV SOLN: INTRAVENOUS | Status: DC

## 2023-09-01 MED ORDER — FOSAPREPITANT DIMEGLUMINE INJECTION 150 MG
150.0000 mg | Freq: Once | INTRAVENOUS | Status: AC
  Administered 2023-09-01: 150 mg via INTRAVENOUS
  Filled 2023-09-01: qty 150

## 2023-09-01 MED ORDER — SODIUM CHLORIDE 0.9% FLUSH
10.0000 mL | Freq: Once | INTRAVENOUS | Status: AC
  Administered 2023-09-01: 10 mL

## 2023-09-01 MED ORDER — DIPHENHYDRAMINE HCL 25 MG PO CAPS
50.0000 mg | ORAL_CAPSULE | Freq: Once | ORAL | Status: AC
  Administered 2023-09-01: 50 mg via ORAL
  Filled 2023-09-01: qty 2

## 2023-09-01 MED ORDER — HEPARIN SOD (PORK) LOCK FLUSH 100 UNIT/ML IV SOLN
500.0000 [IU] | Freq: Once | INTRAVENOUS | Status: AC | PRN
  Administered 2023-09-01: 500 [IU]

## 2023-09-01 MED ORDER — SODIUM CHLORIDE 0.9 % IV SOLN
375.0000 mg/m2 | Freq: Once | INTRAVENOUS | Status: AC
  Administered 2023-09-01: 700 mg via INTRAVENOUS
  Filled 2023-09-01: qty 20

## 2023-09-01 MED ORDER — SODIUM CHLORIDE 0.9% FLUSH
10.0000 mL | INTRAVENOUS | Status: DC | PRN
  Administered 2023-09-01: 10 mL

## 2023-09-01 MED ORDER — DOXORUBICIN HCL CHEMO IV INJECTION 2 MG/ML
50.0000 mg/m2 | Freq: Once | INTRAVENOUS | Status: AC
  Administered 2023-09-01: 94 mg via INTRAVENOUS
  Filled 2023-09-01: qty 47

## 2023-09-01 MED ORDER — ACETAMINOPHEN 325 MG PO TABS
650.0000 mg | ORAL_TABLET | Freq: Once | ORAL | Status: AC
  Administered 2023-09-01: 650 mg via ORAL
  Filled 2023-09-01: qty 2

## 2023-09-01 MED ORDER — SODIUM CHLORIDE 0.9 % IV SOLN
750.0000 mg/m2 | Freq: Once | INTRAVENOUS | Status: AC
  Administered 2023-09-01: 1500 mg via INTRAVENOUS
  Filled 2023-09-01: qty 75

## 2023-09-01 NOTE — Assessment & Plan Note (Addendum)
 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 FISH testing positive for BCL6 and negative for Bcl-2 and C-MYC.  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 Associated cervical, upper abdominal, and mesenteric nodal implants. Osseous involvement in the left sacrum/pelvis.   -Previously I discussed PET/CT findings with the patient, his sister and also showed them the images.  This will serve as a good baseline.  -As patient was complaining of recent onset headaches and tremors, we obtained CSF analysis to make sure there is no lymphoma involvement.  Underwent LP on 06/08/2023.  Cytology from CSF fluid was negative.  Imaging studies showed no gross abnormalities intracranially.  -Hepatitis B core antibody positive and surface antigen negative, likely from past infection.  Previously I discussed his case with his infectious disease doctor Dr. Luciana Axe.  Since patient is on Symtuza, he does not need additional treatment for this. HBV DNA PCR was negative on 06/09/2023.  -He started chemotherapy with R-CHOP on 06/09/2023.  He has been tolerating chemotherapy well.  - After completion of 3 cycles of R-CHOP, restaging PET/CT on 08/02/2023 showed complete response to therapy, [place] 1.  This is consistent with excellent response and plan is to continue R-CHOP for total of 6 cycles.  Today I reviewed PET images and compared to previous PET scan.  Also showed images to patient and his friend, his sister who were accompanying.   -Labs today showed white count of 4800 with ANC of 3300.  Hemoglobin stable at 9.0, platelet count stable at 180,000.  CMP showed creatinine of 1.35, otherwise grossly unremarkable.  No dose-limiting toxicities.  -Due for cycle 5  of R-CHOP today.  Will proceed with chemotherapy without dose reductions.  He will receive Neulasta on day 3.  -We will monitor his CD4 count periodically.  Results from today are pending.  -RTC in 3 weeks for cycle 6 of chemotherapy.  That will be the last of the planned chemotherapy.  Restaging PET scan will be obtained again after completion of 6 cycles.

## 2023-09-01 NOTE — Progress Notes (Signed)
 [facility]  ONCOLOGY CLINIC PROGRESS NOTE   Patient Care Team: Pcp, No as PCP - General Roderic Scarce, LCSW as Counselor Comer, Belia Heman, MD as Consulting Physician (Infectious Diseases) Meryl Crutch, MD as Consulting Physician (Hematology and Oncology)  PATIENT NAME: Jordan Calderon   MR#: [id number] DOB: 08-Apr-1961  Date of visit: 09/01/2023   ASSESSMENT & PLAN:   BRONDON WANN is a 63 y.o. gentleman with past medical history of HIV disease, depression, hypothyroidism, recent syncopal episode in November 2024 for which he was hospitalized from 05/11/2023 until 05/12/2023 with grossly negative TTE and CT head workup, was admitted to the hospital again on 05/17/2023 after he presented with dizziness, headaches.  CT angiogram of the head and neck on 05/17/2023 showed large right nasopharyngeal skull base mass which was suboptimally evaluated but was worrisome for nasopharyngeal carcinoma.  He was admitted for further evaluation and management.  Eventual workup revealed large B-cell lymphoma, high-grade, with multiple liver lesions and biopsies from stomach positive for non-Hodgkin lymphoma.  Stage IV disease.  FISH testing positive for BCL6 and negative for Bcl-2 and C-MYC.  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 FISH testing positive for BCL6 and negative for Bcl-2 and C-MYC.  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 Associated cervical, upper abdominal, and mesenteric nodal implants. Osseous involvement in the left sacrum/pelvis.   -Previously I discussed PET/CT findings with the patient, his sister and also showed them the images.  This will serve as a good  baseline.  -As patient was complaining of recent onset headaches and tremors, we obtained CSF analysis to make sure there is no lymphoma involvement.  Underwent LP on 06/08/2023.  Cytology from CSF fluid was negative.  Imaging studies showed no gross abnormalities intracranially.  -Hepatitis B core antibody positive and surface antigen negative, likely from past infection.  Previously I discussed his case with his infectious disease doctor Dr. Luciana Axe.  Since patient is on Symtuza, he does not need additional treatment for this. HBV DNA PCR was negative on 06/09/2023.  -He started chemotherapy with R-CHOP on 06/09/2023.  He has been tolerating chemotherapy well.  - After completion of 3 cycles of R-CHOP, restaging PET/CT on 08/02/2023 showed complete response to therapy, Deauville 1.  This is consistent with excellent response and plan is to continue R-CHOP for total of 6 cycles.  Today I reviewed PET images and compared to previous PET scan.  Also showed images to patient and his friend, his sister who were accompanying.   -Labs today showed white count of 4800 with ANC of 3300.  Hemoglobin stable at 9.0, platelet count stable at 180,000.  CMP showed creatinine of 1.35, otherwise grossly unremarkable.  No dose-limiting toxicities.  -Due for cycle 5 of R-CHOP today.  Will proceed with chemotherapy without dose reductions.  He will receive Neulasta on day 3.  -We will monitor his CD4 count periodically.  Results from today are pending.  -RTC in 3 weeks for cycle 6 of chemotherapy.  That will be the last of the planned chemotherapy.  Restaging PET scan will be obtained again after completion of 6 cycles.  Human immunodeficiency virus (HIV) disease (HCC) - Currently under reasonable control.  Has close follow-up with ID, Dr.Comer.  His HIV therapy was previously  switched to [employer].     Situational insomnia Difficulty sleeping, likely exacerbated by prednisone. Requests sleep aid. - Sent  prescription for Ativan to [facility] pharmacy  Diarrhea Intermittent diarrhea since August 04, 2023, following the second PET scan. Frequency and severity vary, with some days having no episodes and others multiple, including nocturnal diarrhea. Possible relation to recent change in HIV medication to [facility], though unconfirmed. Symptoms managed with Lomotil and Imodium, which help control frequency when taken regularly. - Continue Lomotil 3-4 times daily. - Use Imodium as needed, starting with two tablets at the first sign of loose stools. - Ensure adequate hydration.   I reviewed lab results and outside records for this visit and discussed relevant results with the patient. Diagnosis, plan of care and treatment options were also discussed in detail with the patient. Opportunity provided to ask questions and answers provided to his apparent satisfaction. Provided instructions to call our clinic with any problems, questions or concerns prior to return visit. I recommended to continue follow-up with PCP and sub-specialists. He verbalized understanding and agreed with the plan.   NCCN guidelines have been consulted in the planning of this patient's care.  I spent a total of 40 minutes during this encounter with the patient including review of chart and various tests results, discussions about plan of care and coordination of care plan.   Meryl Crutch, MD  09/01/2023 11:35 AM  [facility] - A DEPT OF [facility] [address] [place] [address] Dept: [phone] Dept Fax: [phone]    CHIEF COMPLAINT/ REASON FOR VISIT:   Recently diagnosed high-grade B-cell lymphoma, large cell type, stage IV disease with multiple liver lesions. FISH testing positive for BCL6 and negative for Bcl-2 and C-MYC.  Current Treatment: R-CHOP started from 06/09/2023.  INTERVAL HISTORY:    Discussed the use of AI scribe software for  clinical note transcription with the patient, who gave verbal consent to proceed.   He was accompanied by his sister today, who is very supportive.  His sister lives in [place], [place].   He reports diarrhea that started around February 14th, after the second PET scan. The patient reports that the diarrhea is intermittent, with some days being symptom-free, while on other days, the diarrhea is severe enough to disrupt sleep. The patient has been taking Lomotil four times a day and alternating between Imodium and Lomotil when the diarrhea is severe. The patient's sister reports that if the patient stops taking the medication, the diarrhea returns. The patient also reports feeling dehydrated and has been trying to drink more water. The patient has been on Biktarvy for HIV, which the doctor suspects might be contributing to the diarrhea. The patient also has a history of hepatitis B, which is currently inactive.   I have reviewed the past medical history, past surgical history, social history and family history with the patient and they are unchanged from previous note.  HISTORY OF PRESENT ILLNESS:   63 y.o. gentleman with past medical history of HIV disease, depression, hypothyroidism, recent syncopal episode in November 2024 for which he was hospitalized from 05/11/2023 until 05/12/2023 with grossly negative TTE and CT head workup, was admitted to the hospital again on 05/17/2023 after he presented with dizziness, headaches.  CT angiogram of the head and neck on 05/17/2023 showed large right nasopharyngeal skull base mass which was suboptimally evaluated but was worrisome for nasopharyngeal carcinoma.  He was admitted for further  evaluation and management.  Eventual workup revealed large B-cell lymphoma, high-grade, with multiple liver lesions and biopsies from stomach positive for non-Hodgkin lymphoma.  Stage IV disease.   ENT evaluated him and performed biopsy of right nasopharyngeal mass on  05/19/2023.  We were consulted for any additional recommendations.   MR angiogram of the head without IV contrast on 05/11/2023 showed 3 mm outpouching extending posteriorly and laterally from the left ICA terminus, potentially reflecting a vascular infundibulum versus aneurysm.  Otherwise normal intracranial MRA.   CT soft tissue of the neck on 05/18/2023 showed large right nasopharyngeal mass that extended inferiorly to the level of the greater horn of the hyoid bone on the right.  This mass involves right-sided adenoid and palatine tonsils, right parapharyngeal space, right masticator space, right parotid space.  Superiorly this most extended to the level of foreman ovale bilaterally.  A 9 mm hypodense lesion in the left parotid gland was noted, unclear if necrotic lymph node versus intraparotid cyst.  On 05/19/2023, ENT performed excision of nasopharyngeal mass.  Pathology showed respiratory mucosa with chronic sinusitis and prominent secondary follicle/germinal center formation consistent with the presence of tonsillar type tissue.  Negative for malignancy.  On 05/20/2023, CT scan of the chest showed multiple liver lesions, concerning for metastatic disease, at least 15 lesions.  It also showed upper abdominal abnormal lymph nodes.  On 05/23/2023, CT abdomen and pelvis showed similar findings in the liver and gastrohepatic lymph nodes.  Focal wall thickening of the mid gastric lumen with also greater at the lesser curvature of stomach.  Left lung base lung nodule measuring 6 mm.  On 05/22/2023, GI performed upper endoscopy for further evaluation of anemia.  Esophageal mucosal changes classified as Barrett's stage C2-M3.  LA grade D esophagitis.  Esophageal plaques were found, consistent with candidiasis.  Nonbleeding gastric ulcer which was biopsied.  Pathology from stomach ulcer biopsy came back positive for non-Hodgkin's lymphoma, large cell type.  Esophageal biopsy showed Barrett's esophagus.  On  05/23/2023, he underwent core needle biopsy of liver lesions.  Pathology showed high-grade B-cell lymphoma, large cell type.  Ki-67 was more than 50%. FISH testing positive for BCL6 and negative for Bcl-2 and C-MYC.  Staging PET/CT performed on 06/05/2023: 5.4 cm right nasopharyngeal mass, likely corresponding to the patient's known lymphoma.Multifocal pulmonary lymphoma.  Innumerable hepatic metastases. Associated cervical, upper abdominal, and mesenteric nodal implants. Osseous involvement in the left sacrum/pelvis.  Started chemotherapy with R-CHOP from 06/09/2023.  After completion of 3 cycles of R-CHOP, restaging PET/CT on 08/02/2023 showed complete response to therapy, Deauville 1.  This is consistent with excellent response and plan is to continue R-CHOP for total of 6 cycles.  Oncology History  High grade B-cell lymphoma (HCC)  05/31/2023 Initial Diagnosis   DLBCL (diffuse large B cell lymphoma) (HCC)   06/05/2023 Cancer Staging   Staging form: Hodgkin and Non-Hodgkin Lymphoma, AJCC 8th Edition - Clinical: Stage IV (Diffuse large B-cell lymphoma) - Signed by Meryl Crutch, MD on 06/05/2023 Stage prefix: Initial diagnosis   06/09/2023 -  Chemotherapy   Patient is on Treatment Plan : NON-HODGKINS LYMPHOMA R-CHOP q21d         REVIEW OF SYSTEMS:   Review of Systems - Oncology  All other pertinent systems were reviewed with the patient and are negative.  ALLERGIES: He is allergic to sulfonamide derivatives.  MEDICATIONS:  Current Outpatient Medications  Medication Sig Dispense Refill   acyclovir (ZOVIRAX) 400 MG tablet Take 1 tablet (400 mg total) by  mouth daily. 30 tablet 3   bictegravir-emtricitabine-tenofovir AF (BIKTARVY) 50-200-25 MG TABS tablet Take 1 tablet by mouth daily. 30 tablet 11   calcium carbonate (TUMS - DOSED IN MG ELEMENTAL CALCIUM) 500 MG chewable tablet Chew 1 tablet by mouth 3 (three) times daily with meals. (Patient not taking: Reported on 06/28/2023)      dapsone 100 MG tablet Take 1 tablet (100 mg total) by mouth daily. (Patient not taking: Reported on 08/23/2023) 30 tablet 5   diphenoxylate-atropine (LOMOTIL) 2.5-0.025 MG tablet Take 1 tablet by mouth 4 (four) times daily as needed for diarrhea or loose stools. 40 tablet 1   fluconazole (DIFLUCAN) 200 MG tablet Take 1 tablet (200 mg total) by mouth daily. 30 tablet 5   lidocaine-prilocaine (EMLA) cream Apply to affected area once 30 g 3   LORazepam (ATIVAN) 1 MG tablet Take 1 tablet (1 mg total) by mouth 2 (two) times daily as needed for anxiety. 60 tablet 0   morphine (MSIR) 15 MG tablet Take 1 tablet (15 mg total) by mouth every 6 (six) hours as needed for severe pain (pain score 7-10). (Patient not taking: Reported on 08/23/2023) 30 tablet 0   ondansetron (ZOFRAN) 8 MG tablet Take 1 tablet (8 mg total) by mouth every 8 (eight) hours as needed for nausea or vomiting. Start on the third day after cyclophosphamide chemotherapy. (Patient not taking: Reported on 09/01/2023) 30 tablet 1   pantoprazole (PROTONIX) 40 MG tablet Take 1 tablet (40 mg total) by mouth 2 (two) times daily before a meal. 60 tablet 5   predniSONE (DELTASONE) 20 MG tablet Take 5 tablets (100 mg total) by mouth daily. Take with food on days 1-5 of chemotherapy. (Patient not taking: Reported on 08/23/2023) 25 tablet 5   prochlorperazine (COMPAZINE) 10 MG tablet Take 1 tablet (10 mg total) by mouth every 6 (six) hours as needed for nausea or vomiting. (Patient not taking: Reported on 09/01/2023) 30 tablet 6   traMADol (ULTRAM) 50 MG tablet Take 1 tablet (50 mg total) by mouth 3 (three) times daily as needed for moderate pain (pain score 4-6). 30 tablet 0   No current facility-administered medications for this visit.   Facility-Administered Medications Ordered in Other Visits  Medication Dose Route Frequency Provider Last Rate Last Admin   0.9 %  sodium chloride infusion   Intravenous Continuous Cowan Pilar, MD 10 mL/hr at 09/01/23 1114  New Bag at 09/01/23 1114   cyclophosphamide (CYTOXAN) 1,500 mg in sodium chloride 0.9 % 250 mL chemo infusion  750 mg/m2 (Treatment Plan Recorded) Intravenous Once Sharai Overbay, MD       DOXOrubicin (ADRIAMYCIN) chemo injection 94 mg  50 mg/m2 (Treatment Plan Recorded) Intravenous Once Geordie Nooney, MD       fosaprepitant (EMEND) 150 mg in sodium chloride 0.9 % 145 mL IVPB  150 mg Intravenous Once Ashvin Adelson, MD 450 mL/hr at 09/01/23 1130 150 mg at 09/01/23 1130   riTUXimab-pvvr (RUXIENCE) 700 mg in sodium chloride 0.9 % 180 mL infusion  375 mg/m2 (Treatment Plan Recorded) Intravenous Once Cecil Bixby, MD       vinCRIStine (ONCOVIN) 2 mg in sodium chloride 0.9 % 50 mL chemo infusion  2 mg Intravenous Once Sherece Gambrill, MD         VITALS:   Blood pressure 128/84, pulse 77, temperature 97.6 F (36.4 C), temperature source Temporal, resp. rate 16, height 5\' 9"  (1.753 m), weight 175 lb 12.8 oz (79.7 kg), SpO2 95%.  Wt Readings  from Last 3 Encounters:  09/01/23 175 lb 12.8 oz (79.7 kg)  08/23/23 172 lb (78 kg)  08/09/23 173 lb 12 oz (78.8 kg)    Body mass index is 25.96 kg/m.   Onc Performance Status - 09/01/23 1039       ECOG Perf Status   ECOG Perf Status Fully active, able to carry on all pre-disease performance without restriction      KPS SCALE   KPS % SCORE Able to carry on normal activity, minor s/s of disease               PHYSICAL EXAM:   Physical Exam Constitutional:      General: He is not in acute distress.    Appearance: Normal appearance.  HENT:     Head: Normocephalic and atraumatic.  Eyes:     General: No scleral icterus.    Conjunctiva/sclera: Conjunctivae normal.  Cardiovascular:     Rate and Rhythm: Normal rate and regular rhythm.     Heart sounds: Normal heart sounds.  Pulmonary:     Effort: Pulmonary effort is normal.     Breath sounds: Normal breath sounds.  Abdominal:     General: There is no distension.  Musculoskeletal:      Right lower leg: No edema.     Left lower leg: No edema.  Lymphadenopathy:     Cervical: Cervical adenopathy: resolved.  Neurological:     General: No focal deficit present.     Mental Status: He is alert and oriented to person, place, and time.  Psychiatric:     Comments: In good spirits today      LABORATORY DATA:   I have reviewed the data as listed.  Results for orders placed or performed in visit on 09/01/23  CBC with Differential (Cancer Center Only)  Result Value Ref Range   WBC Count 4.8 4.0 - 10.5 K/uL   RBC 2.84 (L) 4.22 - 5.81 MIL/uL   Hemoglobin 9.0 (L) 13.0 - 17.0 g/dL   HCT 40.9 (L) 81.1 - 91.4 %   MCV 98.9 80.0 - 100.0 fL   MCH 31.7 26.0 - 34.0 pg   MCHC 32.0 30.0 - 36.0 g/dL   RDW 78.2 (H) 95.6 - 21.3 %   Platelet Count 180 150 - 400 K/uL   nRBC 0.4 (H) 0.0 - 0.2 %   Neutrophils Relative % 68 %   Neutro Abs 3.3 1.7 - 7.7 K/uL   Lymphocytes Relative 15 %   Lymphs Abs 0.7 0.7 - 4.0 K/uL   Monocytes Relative 14 %   Monocytes Absolute 0.7 0.1 - 1.0 K/uL   Eosinophils Relative 1 %   Eosinophils Absolute 0.0 0.0 - 0.5 K/uL   Basophils Relative 2 %   Basophils Absolute 0.1 0.0 - 0.1 K/uL   Immature Granulocytes 0 %   Abs Immature Granulocytes 0.02 0.00 - 0.07 K/uL  CMP (Cancer Center only)  Result Value Ref Range   Sodium 141 135 - 145 mmol/L   Potassium 3.8 3.5 - 5.1 mmol/L   Chloride 111 98 - 111 mmol/L   CO2 26 22 - 32 mmol/L   Glucose, Bld 84 70 - 99 mg/dL   BUN 26 (H) 8 - 23 mg/dL   Creatinine 0.86 (H) 5.78 - 1.24 mg/dL   Calcium 8.7 (L) 8.9 - 10.3 mg/dL   Total Protein 6.9 6.5 - 8.1 g/dL   Albumin 4.1 3.5 - 5.0 g/dL   AST 23 15 - 41 U/L  ALT 24 0 - 44 U/L   Alkaline Phosphatase 73 38 - 126 U/L   Total Bilirubin 0.4 0.0 - 1.2 mg/dL   GFR, Estimated 59 (L) >60 mL/min   Anion gap 4 (L) 5 - 15      RADIOGRAPHIC STUDIES:  I have personally reviewed the radiological images as listed and agree with the findings in the report.  NM PET Image  Restag (PS) Skull Base To Thigh Result Date: 08/09/2023 CLINICAL DATA:  Subsequent treatment strategy for diffuse large B-cell lymphoma. EXAM: NUCLEAR MEDICINE PET SKULL BASE TO THIGH TECHNIQUE: 8.7 mCi F-18 FDG was injected intravenously. Full-ring PET imaging was performed from the skull base to thigh after the radiotracer. CT data was obtained and used for attenuation correction and anatomic localization. Fasting blood glucose: 106 mg/dl COMPARISON:  PET-CT 91/47/8295 FINDINGS: NECK: Complete resolution of the hypermetabolic mass centered in the posterior RIGHT hypopharynx. Interval resolution of the bilateral hypermetabolic level 2 lymph nodes. No hypermetabolic mass remains the hyper pharynx. No hypermetabolic pulmonary nodules. Incidental CT findings: None. CHEST: Interval resolution of the previous seen hypermetabolic mass along the mediastinal border in the LEFT upper lobe. No residual measurable mass or metabolic activity. Additionally discrete hypermetabolic pulmonary nodules in the RIGHT lower lobe have resolved. Hypermetabolic mediastinal adenopathy is resolved. No measurable disease remains. Incidental CT findings: None. ABDOMEN/PELVIS: Interval complete resolution of the previously seen hypermetabolic lesions within the liver. No abnormal metabolic activity above background physiologic liver activity. The spleen is normal volume and normal metabolic activity. Previously identified intense hypermetabolic activity throughout the stomach has resolved completely. Intense metabolic scattered throughout the colon small bowel. Intense activity in the cecum associated with fluid within the cecum. Favored physiologic bowel activity. Incidental CT findings: None. SKELETON: radiotracer activity posterior LEFT iliac bone has resolved. There is underlying uniform increase in marrow activity throughout the pelvis and spine. Incidental CT findings: None. IMPRESSION: 1. Complete response to therapy.  Deauville 1 2.  Interval resolution of hypermetabolic mass in the RIGHT hypopharynx. 3. Interval resolution of hypermetabolic adenopathy in the neck. 4. Interval resolution of hypermetabolic mass in the LEFT upper lobe. 5. Interval resolution of hypermetabolic pulmonary nodules in the RIGHT lower lobe. 6. Interval resolution of hypermetabolic liver lesions. 7. Interval resolution of hypermetabolic activity in the stomach. 8. Interval resolution of hypermetabolic activity in the LEFT iliac bone. 9. Diffuse increase in marrow activity throughout the spine and pelvis is favored reactive marrow hyperplasia (GCSF type response). Electronically Signed   By: Genevive Bi M.D.   On: 08/09/2023 11:04     CODE STATUS:  Code Status History     Date Active Date Inactive Code Status Order ID Comments User Context   05/29/2023 0850 05/30/2023 2252 Full Code [id number]  Maryln Gottron, MD ED   05/17/2023 1906 05/24/2023 1834 Full Code [id number]  Rometta Emery, MD Inpatient   05/17/2023 1532 05/17/2023 1906 Full Code [id number]  Nolberto Hanlon, MD ED   05/11/2023 2024 05/12/2023 2308 Full Code [id number]  Hillary Bow, DO Inpatient   07/01/2018 1624 07/03/2018 1537 Full Code [id number]  Dorcas Carrow, MD Inpatient   02/10/2014 2054 02/12/2014 0031 Full Code [id number]  Christen Bame, MD Inpatient    Questions for Most Recent Historical Code Status (Order [id number])     Question Answer   By: Consent: discussion documented in EHR            No orders of the defined types were placed in this encounter.  Future Appointments  Date Time Provider Department Center  09/04/2023  1:15 PM [facility] FLUSH [facility] None  09/12/2023 11:00 AM Arnaldo Natal, NP [facility]  09/22/2023 10:30 AM [facility] FLUSH [facility] None  09/22/2023 11:00 AM Asha Grumbine, MD [facility] None  09/22/2023 12:00 PM [facility] INFUSION [facility] None  09/25/2023  2:00 PM [facility] FLUSH [facility] None      This  document was completed utilizing speech recognition software. Grammatical errors, random word insertions, pronoun errors, and incomplete sentences are an occasional consequence of this system due to software limitations, ambient noise, and hardware issues. Any formal questions or concerns about the content, text or information contained within the body of this dictation should be directly addressed to the provider for clarification.

## 2023-09-01 NOTE — Patient Instructions (Signed)
 [facility] - A DEPT OF [facility]  Discharge Instructions: Thank you for choosing [facility] to provide your oncology and hematology care.   If you have a lab appointment with the Cancer Center, please go directly to the Cancer Center and check in at the registration area.   Wear comfortable clothing and clothing appropriate for easy access to any Portacath or PICC line.   We strive to give you quality time with your provider. You may need to reschedule your appointment if you arrive late (15 or more minutes).  Arriving late affects you and other patients whose appointments are after yours.  Also, if you miss three or more appointments without notifying the office, you may be dismissed from the clinic at the provider's discretion.      For prescription refill requests, have your pharmacy contact our office and allow 72 hours for refills to be completed.    Today you received the following chemotherapy and/or immunotherapy agents: Doxorubicin, Vincristine, Cyclophosphamide, Rituximab      To help prevent nausea and vomiting after your treatment, we encourage you to take your nausea medication as directed.  BELOW ARE SYMPTOMS THAT SHOULD BE REPORTED IMMEDIATELY: *FEVER GREATER THAN 100.4 F (38 C) OR HIGHER *CHILLS OR SWEATING *NAUSEA AND VOMITING THAT IS NOT CONTROLLED WITH YOUR NAUSEA MEDICATION *UNUSUAL SHORTNESS OF BREATH *UNUSUAL BRUISING OR BLEEDING *URINARY PROBLEMS (pain or burning when urinating, or frequent urination) *BOWEL PROBLEMS (unusual diarrhea, constipation, pain near the anus) TENDERNESS IN MOUTH AND THROAT WITH OR WITHOUT PRESENCE OF ULCERS (sore throat, sores in mouth, or a toothache) UNUSUAL RASH, SWELLING OR PAIN  UNUSUAL VAGINAL DISCHARGE OR ITCHING   Items with * indicate a potential emergency and should be followed up as soon as possible or go to the Emergency Department if any problems should occur.  Please show  the CHEMOTHERAPY ALERT CARD or IMMUNOTHERAPY ALERT CARD at check-in to the Emergency Department and triage nurse.  Should you have questions after your visit or need to cancel or reschedule your appointment, please contact [facility] - A DEPT OF [facility]  Dept: [phone]  and follow the prompts.  Office hours are 8:00 a.m. to 4:30 p.m. Monday - Friday. Please note that voicemails left after 4:00 p.m. may not be returned until the following business day.  We are closed weekends and major holidays. You have access to a nurse at all times for urgent questions. Please call the main number to the clinic Dept: [phone] and follow the prompts.   For any non-urgent questions, you may also contact your provider using MyChart. We now offer e-Visits for anyone 60 and older to request care online for non-urgent symptoms. For details visit [email or web address].   Also download the MyChart app! Go to the app store, search "MyChart", open the app, select [facility], and log in with your MyChart username and password.

## 2023-09-01 NOTE — Assessment & Plan Note (Signed)
 Difficulty sleeping, likely exacerbated by prednisone. Requests sleep aid. - Sent prescription for Ativan to [facility] pharmacy

## 2023-09-01 NOTE — Progress Notes (Signed)
 Patient seen by Dr. Archie Patten Pasam today  Vitals are within treatment parameters:Yes   Labs are within treatment parameters: Yes   Treatment plan has been signed: Yes   Per physician team, Patient is ready for treatment and there are NO modifications to the treatment plan.

## 2023-09-01 NOTE — Assessment & Plan Note (Addendum)
-   Currently under reasonable control.  Has close follow-up with ID, Dr.Comer.  His HIV therapy was previously switched to [employer].

## 2023-09-01 NOTE — Assessment & Plan Note (Signed)
 Intermittent diarrhea since August 04, 2023, following the second PET scan. Frequency and severity vary, with some days having no episodes and others multiple, including nocturnal diarrhea. Possible relation to recent change in HIV medication to [facility], though unconfirmed. Symptoms managed with Lomotil and Imodium, which help control frequency when taken regularly. - Continue Lomotil 3-4 times daily. - Use Imodium as needed, starting with two tablets at the first sign of loose stools. - Ensure adequate hydration.

## 2023-09-04 ENCOUNTER — Inpatient Hospital Stay: Payer: Medicare Other

## 2023-09-04 VITALS — BP 129/81 | HR 64 | Temp 97.7°F | Resp 18

## 2023-09-04 DIAGNOSIS — Z5111 Encounter for antineoplastic chemotherapy: Secondary | ICD-10-CM | POA: Diagnosis not present

## 2023-09-04 DIAGNOSIS — C8510 Unspecified B-cell lymphoma, unspecified site: Secondary | ICD-10-CM

## 2023-09-04 MED ORDER — PEGFILGRASTIM-FPGK 6 MG/0.6ML ~~LOC~~ SOSY
6.0000 mg | PREFILLED_SYRINGE | Freq: Once | SUBCUTANEOUS | Status: AC
  Administered 2023-09-04: 6 mg via SUBCUTANEOUS
  Filled 2023-09-04: qty 0.6

## 2023-09-12 ENCOUNTER — Ambulatory Visit (INDEPENDENT_AMBULATORY_CARE_PROVIDER_SITE_OTHER): Payer: Medicare Other | Admitting: Nurse Practitioner

## 2023-09-12 ENCOUNTER — Encounter: Payer: Self-pay | Admitting: Nurse Practitioner

## 2023-09-12 VITALS — BP 118/62 | HR 94 | Ht 69.0 in | Wt 165.0 lb

## 2023-09-12 DIAGNOSIS — C8510 Unspecified B-cell lymphoma, unspecified site: Secondary | ICD-10-CM

## 2023-09-12 DIAGNOSIS — R197 Diarrhea, unspecified: Secondary | ICD-10-CM

## 2023-09-12 DIAGNOSIS — K2270 Barrett's esophagus without dysplasia: Secondary | ICD-10-CM

## 2023-09-12 NOTE — Patient Instructions (Addendum)
 Your provider has ordered "Diatherix" stool testing for you. You have received a kit from our office today containing all necessary supplies to complete this test. Please carefully read the stool collection instructions provided in the kit before opening the accompanying materials. In addition, be sure there is a label providing your full name and date of birth on the "puritan opti-swab" tube that is supplied in the kit (if you do not see a label with this information on your test tube, please make [place] aware before test collection!). After completing the test, you should secure the purtian tube into the specimen biohazard bag. The [facility] E-Req sheet (including date and time of specimen collection) should be placed into the outside pocket of the specimen biohazard bag and returned to the [place] lab (basement floor of [facility]) within 3 days of collection. Please make sure to give the specimen to a staff member at the lab. DO NOT leave the specimen on the counter.   If the specimen date and time (can be found in the upper right boxed portion of the sheet) are not filled out on the E-Req sheet, the test will NOT be performed.   We will contact your with recommendations from Dr.Armbruster.  Lomotil- take 1 tablet daily  Due to recent changes in healthcare laws, you may see the results of your imaging and laboratory studies on MyChart before your provider has had a chance to review them.  We understand that in some cases there may be results that are confusing or concerning to you. Not all laboratory results come back in the same time frame and the provider may be waiting for multiple results in order to interpret others.  Please give [place] 48 hours in order for your provider to thoroughly review all the results before contacting the office for clarification of your results.   Thank you for trusting me with your gastrointestinal care!   Alcide Evener, CRNP

## 2023-09-12 NOTE — Progress Notes (Unsigned)
 09/12/2023 Jordan Calderon [id number] 29-Mar-1961   Chief Complaint:  History of Present Illness:   He denies having any dysphagia or heartburn. He is passing liquid stools 2 to 4 times daily since 08/05/2023 First chemo 05/27/2023 Oncology doesn't assess diarrhea is d/t  Possible Bictarvy, 6 weeks after started  Taking Lomotil PRN Imodium one tab bid was ineffective  Congested cough    EGD 05/22/2023 as an inpatient by Dr. [name]: - Esophagogastric landmarks identified. - 5 cm hiatal hernia. - Esophageal mucosal changes classified as Barrett's stage C2-M3 per [place] criteria. Biopsied. - LA Grade D esophagitis up through 30cm from the incisors. Biopsied. - Esophageal plaques were found, consistent with candidiasis. - Non-bleeding gastric ulcer with no stigmata of bleeding. Biopsied. - Normal stomach otherwise - biopsies taken to rule out H pylori. - Nodule vs. prominent fold found in the duodenum with duodenal edema. Biopsied. Suspect bleeding symptoms were from gastric ulcer / severe esophagitis in the setting of NSAID use. Impression: - Return patient to hospital ward for ongoing care. - Soft diet - Continue present medications. - Continue protonix 40mg  twice daily - Treat esophageal candidiasis with fluconazole 400mg  x 1 dose then 200mg  / daily - Hold all NSAIDs - Await pathology resu  A. DUODENUM, BIOPSY: -Benign duodenal mucosa  B. STOMACH, ULCER, BIOPSY: -Non-Hodgkin B-cell lymphoma -See comment  C. STOMACH, ANTRUM AND BODY, BIOPSY: -Benign gastric mucosa  D. ESOPHAGUS, BIOPSY: Intestinal metaplasia consistent with Barrett's esophagus -Focal glandular atypia -See comment  E. ESOPHAGUS, ULCER, BIOPSY: -Benign squamous mucosa with acute inflammation -See comment  COMMENT:  B. Sections of the stomach (specimen B) show lamina propria involvement by an infiltrate of primarily large atypical lymphoid appearing cells characterized by vesicular chromatin and small  to inconspicuous nucleoli associated with scattered mitosis. The appearance is similar to that seen in liver biopsy (MCS [id number]).  Immunohistochemical stains performed with appropriate controls show that the large atypical lymphoid cells are positive for CD20, CD79a, PAX5 and CD10.  No significant staining is seen with CD68 or cytokeratin AE1/AE3.  There is an admixed population of T cells to a lesser extent as seen with CD3 and CD5 and there is no apparent co-expression of CD5 in B-cell areas.  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  FISH for high-grade B-cell lymphoma will be performed.  The results were discussed with Dr. [name] on 05/26/2023.   D: The sections show intestinal metaplasia consistent with Barrett's of esophagus.  There is focal glandular atypia with crypt distortion and increased mitosis.  This focus shows some degree of surface maturation. The findings are indeterminant for low-grade glandular dysplasia.  E.  Fungal hyphae are seen on HE-stained slides. No viral cytopathic changes are seen.  I called the patient about the biopsy results from his recent endoscopy and liver biopsy.  Unfortunately we are seeing lymphoma in his liver and his stomach at the ulcer biopsy.  I explained to him what this is and he understood.  Neck step is to follow-up with oncology, I believe they are considering a PET scan.  He understands this.  He is not aware of any follow-up with them or schedule PET scan yet.   He also had esophageal candidiasis which we know about and has been treating with fluconazole.  He will remain on Protonix twice daily.   He had significant esophagitis with Barrett's esophagus noted.  There is some focal atypia on biopsies but no overt dysplasia or  malignancy.  He will need a repeat EGD in 3 months or so if he is stable for it to reassess this area.   Jan can you please coordinate an office visit for this patient with me  in 3 months or so?  We will see if he is stable enough for an EGD at that time  09/21/2023, last chemo,     Liver biopsy  A. LIVER, RIGHT LOBE, NEEDLE CORE BIOPSY:  -High-grade B-cell lymphoma  -See comment     Past Medical History:  Diagnosis Date   Anxiety    Cellulitis 02/11/2014   LEFT ARM   Depression    Headache(784.0)    HIV (human immunodeficiency virus infection) (HCC)    Lymphoma (HCC)    Past Surgical History:  Procedure Laterality Date   ARM HARDWARE REMOVAL Left    arm surgery     BIOPSY  05/22/2023   Procedure: BIOPSY;  Surgeon: Benancio Deeds, MD;  Location: [facility];  Service: Gastroenterology;;   ESOPHAGOGASTRODUODENOSCOPY (EGD) WITH PROPOFOL N/A 05/22/2023   Procedure: ESOPHAGOGASTRODUODENOSCOPY (EGD) WITH PROPOFOL;  Surgeon: Benancio Deeds, MD;  Location: [facility];  Service: Gastroenterology;  Laterality: N/A;   HEMORRHOID SURGERY     IR IMAGING GUIDED PORT INSERTION  06/06/2023   NASOPHARYNGEAL BIOPSY N/A 05/19/2023   Procedure: NASOPHARYNGEAL BIOPSY;  Surgeon: Newman Pies, MD;  Location: [facility] OR;  Service: ENT;  Laterality: N/A;      Current Medications, Allergies, Past Medical History, Past Surgical History, Family History and Social History were reviewed in [employer] medical record.   Review of Systems:   Constitutional: Negative for fever, sweats, chills or weight loss.  Respiratory: Negative for shortness of breath.   Cardiovascular: Negative for chest pain, palpitations and leg swelling.  Gastrointestinal: See HPI.  Musculoskeletal: Negative for back pain or muscle aches.  Neurological: Negative for dizziness, headaches or paresthesias.    Physical Exam: There were no vitals taken for this visit. General: in no acute distress. Head: Normocephalic and atraumatic. Eyes: No scleral icterus. Conjunctiva pink . Ears: Normal auditory acuity. Mouth: Dentition intact. No ulcers or lesions.  Lungs: Clear throughout  to auscultation. Heart: Regular rate and rhythm, no murmur. Abdomen: Soft, nontender and nondistended. No masses or hepatomegaly. Normal bowel sounds x 4 quadrants.  Rectal: Deferred.  Musculoskeletal: Symmetrical with no gross deformities. Extremities: No edema. Neurological: Alert oriented x 4. No focal deficits.  Psychological: Alert and cooperative. Normal mood and affect  Assessment and Recommendations: ***

## 2023-09-13 NOTE — Progress Notes (Signed)
 Thanks for seeing this patient Jordan Calderon. He has widely metastatic malignancy and currently on chemotherapy. He is supposed to be completing a few more cycles of chemotherapy followed by a PET scan. I think probably best for him to complete his chemotherapy, have the PET and await his course over the next few months. EGD will likely not change his management at all right now. I would continue PPI BID for ulcer / esophagitis / Barrett's. He can see [place] in 4 months or so for reassessment. I think risks of EGD outweigh benefits right now given his significant malignancy burden / comorbidities.

## 2023-09-15 NOTE — Progress Notes (Signed)
 I called the patient and his sister [name]) and I discussed Dr. Lanetta Inch recommendations as noted below.  EGD deferred for now.  Patient to schedule an office appoint with Dr. [name] in 4 months.  She stated she will contact the office in 2 months to schedule a follow up appointment. She stated a friend will likely accompany patient to next office appointment and she will need check friend's schedule.  DD, please enter recall appointment letter, patient due for [facility] July 2025,  to make sure we do not lose patient to follow-up.  Thank you

## 2023-09-18 ENCOUNTER — Other Ambulatory Visit: Payer: Self-pay

## 2023-09-18 ENCOUNTER — Other Ambulatory Visit: Payer: Self-pay | Admitting: Oncology

## 2023-09-18 DIAGNOSIS — C8510 Unspecified B-cell lymphoma, unspecified site: Secondary | ICD-10-CM

## 2023-09-20 ENCOUNTER — Telehealth: Payer: Self-pay | Admitting: Nurse Practitioner

## 2023-09-20 ENCOUNTER — Encounter: Payer: Self-pay | Admitting: Oncology

## 2023-09-20 NOTE — Telephone Encounter (Signed)
 I received a call from [facility] requesting that we send over a diafairex requestion panel for this patient. Jordan Calderon stated that a good call back number for her is a [phone]. Please advise.

## 2023-09-21 MED FILL — Fosaprepitant Dimeglumine For IV Infusion 150 MG (Base Eq): INTRAVENOUS | Qty: 5 | Status: AC

## 2023-09-22 ENCOUNTER — Inpatient Hospital Stay: Payer: Medicare Other | Admitting: Oncology

## 2023-09-22 ENCOUNTER — Other Ambulatory Visit: Payer: Self-pay

## 2023-09-22 ENCOUNTER — Inpatient Hospital Stay: Payer: Medicare Other | Attending: Oncology

## 2023-09-22 ENCOUNTER — Encounter: Payer: Self-pay | Admitting: Oncology

## 2023-09-22 ENCOUNTER — Inpatient Hospital Stay: Payer: Medicare Other

## 2023-09-22 VITALS — BP 129/90 | HR 85 | Temp 97.7°F | Resp 14 | Wt 165.0 lb

## 2023-09-22 VITALS — BP 128/91 | HR 74 | Temp 97.9°F | Resp 16

## 2023-09-22 DIAGNOSIS — C8510 Unspecified B-cell lymphoma, unspecified site: Secondary | ICD-10-CM | POA: Diagnosis not present

## 2023-09-22 DIAGNOSIS — F5109 Other insomnia not due to a substance or known physiological condition: Secondary | ICD-10-CM | POA: Diagnosis not present

## 2023-09-22 DIAGNOSIS — R197 Diarrhea, unspecified: Secondary | ICD-10-CM

## 2023-09-22 DIAGNOSIS — D649 Anemia, unspecified: Secondary | ICD-10-CM | POA: Diagnosis not present

## 2023-09-22 DIAGNOSIS — C8338 Diffuse large B-cell lymphoma, lymph nodes of multiple sites: Secondary | ICD-10-CM | POA: Insufficient documentation

## 2023-09-22 DIAGNOSIS — B20 Human immunodeficiency virus [HIV] disease: Secondary | ICD-10-CM

## 2023-09-22 DIAGNOSIS — Z5189 Encounter for other specified aftercare: Secondary | ICD-10-CM | POA: Diagnosis not present

## 2023-09-22 DIAGNOSIS — Z5111 Encounter for antineoplastic chemotherapy: Secondary | ICD-10-CM | POA: Insufficient documentation

## 2023-09-22 DIAGNOSIS — Z5112 Encounter for antineoplastic immunotherapy: Secondary | ICD-10-CM | POA: Insufficient documentation

## 2023-09-22 DIAGNOSIS — Z95828 Presence of other vascular implants and grafts: Secondary | ICD-10-CM

## 2023-09-22 LAB — CBC WITH DIFFERENTIAL (CANCER CENTER ONLY)
Abs Immature Granulocytes: 0.04 10*3/uL (ref 0.00–0.07)
Basophils Absolute: 0.1 10*3/uL (ref 0.0–0.1)
Basophils Relative: 1 %
Eosinophils Absolute: 0 10*3/uL (ref 0.0–0.5)
Eosinophils Relative: 0 %
HCT: 30 % — ABNORMAL LOW (ref 39.0–52.0)
Hemoglobin: 9.8 g/dL — ABNORMAL LOW (ref 13.0–17.0)
Immature Granulocytes: 1 %
Lymphocytes Relative: 8 %
Lymphs Abs: 0.6 10*3/uL — ABNORMAL LOW (ref 0.7–4.0)
MCH: 31.6 pg (ref 26.0–34.0)
MCHC: 32.7 g/dL (ref 30.0–36.0)
MCV: 96.8 fL (ref 80.0–100.0)
Monocytes Absolute: 0.2 10*3/uL (ref 0.1–1.0)
Monocytes Relative: 3 %
Neutro Abs: 6.3 10*3/uL (ref 1.7–7.7)
Neutrophils Relative %: 87 %
Platelet Count: 170 10*3/uL (ref 150–400)
RBC: 3.1 MIL/uL — ABNORMAL LOW (ref 4.22–5.81)
RDW: 15.9 % — ABNORMAL HIGH (ref 11.5–15.5)
WBC Count: 7.2 10*3/uL (ref 4.0–10.5)
nRBC: 0 % (ref 0.0–0.2)

## 2023-09-22 LAB — CMP (CANCER CENTER ONLY)
ALT: 33 U/L (ref 0–44)
AST: 31 U/L (ref 15–41)
Albumin: 4.3 g/dL (ref 3.5–5.0)
Alkaline Phosphatase: 92 U/L (ref 38–126)
Anion gap: 7 (ref 5–15)
BUN: 14 mg/dL (ref 8–23)
CO2: 24 mmol/L (ref 22–32)
Calcium: 9 mg/dL (ref 8.9–10.3)
Chloride: 108 mmol/L (ref 98–111)
Creatinine: 1.16 mg/dL (ref 0.61–1.24)
GFR, Estimated: >60 mL/min (ref >60)
Glucose, Bld: 110 mg/dL — ABNORMAL HIGH (ref 70–99)
Potassium: 3.6 mmol/L (ref 3.5–5.1)
Sodium: 139 mmol/L (ref 135–145)
Total Bilirubin: 0.5 mg/dL (ref 0.0–1.2)
Total Protein: 7.2 g/dL (ref 6.5–8.1)

## 2023-09-22 LAB — T-HELPER CELLS (CD4) COUNT (NOT AT ARMC)
CD4 % Helper T Cell: 5 % — ABNORMAL LOW (ref 33–65)
CD4 T Cell Abs: <35 /uL — ABNORMAL LOW (ref 400–1790)

## 2023-09-22 MED ORDER — VINCRISTINE SULFATE CHEMO INJECTION 1 MG/ML
2.0000 mg | Freq: Once | INTRAVENOUS | Status: AC
  Administered 2023-09-22: 2 mg via INTRAVENOUS
  Filled 2023-09-22: qty 2

## 2023-09-22 MED ORDER — DEXAMETHASONE SODIUM PHOSPHATE 10 MG/ML IJ SOLN
10.0000 mg | Freq: Once | INTRAMUSCULAR | Status: AC
  Administered 2023-09-22: 10 mg via INTRAVENOUS
  Filled 2023-09-22: qty 1

## 2023-09-22 MED ORDER — DIPHENOXYLATE-ATROPINE 2.5-0.025 MG PO TABS: 1.0000 | ORAL_TABLET | Freq: Four times a day (QID) | ORAL | 1 refills | Status: DC | PRN

## 2023-09-22 MED ORDER — DIPHENHYDRAMINE HCL 25 MG PO CAPS
50.0000 mg | ORAL_CAPSULE | Freq: Once | ORAL | Status: AC
  Administered 2023-09-22: 50 mg via ORAL
  Filled 2023-09-22: qty 2

## 2023-09-22 MED ORDER — SODIUM CHLORIDE 0.9 % IV SOLN
750.0000 mg/m2 | Freq: Once | INTRAVENOUS | Status: AC
  Administered 2023-09-22: 1500 mg via INTRAVENOUS
  Filled 2023-09-22: qty 75

## 2023-09-22 MED ORDER — SODIUM CHLORIDE 0.9 % IV SOLN
150.0000 mg | Freq: Once | INTRAVENOUS | Status: AC
  Administered 2023-09-22: 150 mg via INTRAVENOUS
  Filled 2023-09-22: qty 150

## 2023-09-22 MED ORDER — SODIUM CHLORIDE 0.9 % IV SOLN
375.0000 mg/m2 | Freq: Once | INTRAVENOUS | Status: AC
  Administered 2023-09-22: 700 mg via INTRAVENOUS
  Filled 2023-09-22: qty 50

## 2023-09-22 MED ORDER — ACETAMINOPHEN 325 MG PO TABS
650.0000 mg | ORAL_TABLET | Freq: Once | ORAL | Status: AC
  Administered 2023-09-22: 650 mg via ORAL
  Filled 2023-09-22: qty 2

## 2023-09-22 MED ORDER — DOXORUBICIN HCL CHEMO IV INJECTION 2 MG/ML
50.0000 mg/m2 | Freq: Once | INTRAVENOUS | Status: AC
  Administered 2023-09-22: 94 mg via INTRAVENOUS
  Filled 2023-09-22: qty 47

## 2023-09-22 MED ORDER — SODIUM CHLORIDE 0.9 % IV SOLN
INTRAVENOUS | Status: DC
Start: 2023-09-22 — End: 2023-09-22

## 2023-09-22 MED ORDER — SODIUM CHLORIDE 0.9% FLUSH
10.0000 mL | INTRAVENOUS | Status: DC | PRN
Start: 2023-09-22 — End: 2023-09-22
  Administered 2023-09-22: 10 mL

## 2023-09-22 MED ORDER — SODIUM CHLORIDE 0.9% FLUSH
10.0000 mL | Freq: Once | INTRAVENOUS | Status: AC
  Administered 2023-09-22: 10 mL

## 2023-09-22 MED ORDER — PALONOSETRON HCL INJECTION 0.25 MG/5ML
0.2500 mg | Freq: Once | INTRAVENOUS | Status: AC
  Administered 2023-09-22: 0.25 mg via INTRAVENOUS
  Filled 2023-09-22: qty 5

## 2023-09-22 MED ORDER — HEPARIN SOD (PORK) LOCK FLUSH 100 UNIT/ML IV SOLN
500.0000 [IU] | Freq: Once | INTRAVENOUS | Status: AC | PRN
  Administered 2023-09-22: 500 [IU]

## 2023-09-22 NOTE — Progress Notes (Signed)
 [facility]  ONCOLOGY CLINIC PROGRESS NOTE   Patient Care Team: Pcp, No as PCP - General Roderic Scarce, LCSW as Counselor Comer, Belia Heman, MD as Consulting Physician (Infectious Diseases) Meryl Crutch, MD as Consulting Physician (Hematology and Oncology)  PATIENT NAME: Jordan Calderon   MR#: [id number] DOB: Sep 10, 1960  Date of visit: 09/22/2023   ASSESSMENT & PLAN:   Jordan Calderon is a 63 y.o. gentleman with past medical history of HIV disease, depression, hypothyroidism, recent syncopal episode in November 2024 for which he was hospitalized from 05/11/2023 until 05/12/2023 with grossly negative TTE and CT head workup, was admitted to the hospital again on 05/17/2023 after he presented with dizziness, headaches.  CT angiogram of the head and neck on 05/17/2023 showed large right nasopharyngeal skull base mass which was suboptimally evaluated but was worrisome for nasopharyngeal carcinoma.  He was admitted for further evaluation and management.  Eventual workup revealed large B-cell lymphoma, high-grade, with multiple liver lesions and biopsies from stomach positive for non-Hodgkin lymphoma.  Stage IV disease.  FISH testing positive for BCL6 and negative for Bcl-2 and C-MYC.  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 FISH testing positive for BCL6 and negative for Bcl-2 and C-MYC.  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 Associated cervical, upper abdominal, and mesenteric nodal implants. Osseous involvement in the left sacrum/pelvis.   -Previously I discussed PET/CT findings with the patient, his sister and also showed them the images.  This will serve as a good  baseline.  -As patient was complaining of recent onset headaches and tremors, we obtained CSF analysis to make sure there is no lymphoma involvement.  Underwent LP on 06/08/2023.  Cytology from CSF fluid was negative.  Imaging studies showed no gross abnormalities intracranially.  -Hepatitis B core antibody positive and surface antigen negative, likely from past infection.  Previously I discussed his case with his infectious disease doctor Dr. Luciana Axe.  Since patient is on Symtuza, he does not need additional treatment for this. HBV DNA PCR was negative on 06/09/2023.  -He started chemotherapy with R-CHOP on 06/09/2023.  He has been tolerating chemotherapy well.  - After completion of 3 cycles of R-CHOP, restaging PET/CT on 08/02/2023 showed complete response to therapy, Deauville 1.  This is consistent with excellent response and plan is to continue R-CHOP for total of 6 cycles.  Today I reviewed PET images and compared to previous PET scan.  Also showed images to patient and his friend, his sister who were accompanying.   -Labs today showed white count of 7200 with ANC of 6300.  Hemoglobin stable at 9.8, platelet count stable at 170,000.  CMP showed creatinine of 1.16, otherwise grossly unremarkable.  No dose-limiting toxicities.  -Due for cycle 6 of R-CHOP today.  Will proceed with chemotherapy without dose reductions.  He will receive Neulasta on day 3.  -We will monitor his CD4 count periodically.  Results from today are pending.  Today will be the last of the planned chemotherapy.  Restaging PET scan will be obtained in approximately a month from now and I will see him in clinic with results.  Will continue surveillance as per NCCN guidelines going forward.  Normocytic anemia Likely from newly diagnosed large B-cell lymphoma.  Could also be from his  medications.  -Iron studies grossly unremarkable recently.  B12, folate are within normal limits.  Will monitor this during treatments.   Hemoglobin is overall stable at  9.8 today.  Situational insomnia Difficulty sleeping, likely exacerbated by prednisone. Requests sleep aid. - Sent prescription for Ativan to [facility] pharmacy  Human immunodeficiency virus (HIV) disease (HCC) - Currently under reasonable control.  Has close follow-up with ID, Dr.Comer.  His HIV therapy was previously switched to [employer].     Diarrhea Intermittent diarrhea since August 04, 2023, following the second PET scan. Frequency and severity vary, with some days having no episodes and others multiple, including nocturnal diarrhea. Possible relation to recent change in HIV medication to [facility], though unconfirmed. Symptoms managed with Lomotil and Imodium, which help control frequency when taken regularly. - Continue Lomotil 3-4 times daily. - Use Imodium as needed, starting with two tablets at the first sign of loose stools. - Ensure adequate hydration.    I reviewed lab results and outside records for this visit and discussed relevant results with the patient. Diagnosis, plan of care and treatment options were also discussed in detail with the patient. Opportunity provided to ask questions and answers provided to his apparent satisfaction. Provided instructions to call our clinic with any problems, questions or concerns prior to return visit. I recommended to continue follow-up with PCP and sub-specialists. He verbalized understanding and agreed with the plan.   NCCN guidelines have been consulted in the planning of this patient's care.  I spent a total of 40 minutes during this encounter with the patient including review of chart and various tests results, discussions about plan of care and coordination of care plan.   Meryl Crutch, MD  09/22/2023 4:08 PM  [facility] - A DEPT OF [facility] [address] [place] [address] Dept: [phone] Dept Fax: [phone]     CHIEF COMPLAINT/ REASON FOR VISIT:   Recently diagnosed high-grade B-cell lymphoma, large cell type, stage IV disease with multiple liver lesions. FISH testing positive for BCL6 and negative for Bcl-2 and C-MYC.  Current Treatment: R-CHOP started from 06/09/2023.  INTERVAL HISTORY:    Discussed the use of AI scribe software for clinical note transcription with the patient, who gave verbal consent to proceed.   He was accompanied by his sister today, who is very supportive.  His sister lives in [place], [place].   He has been experiencing persistent diarrhea, described as 'ribbon-like' soft stools, which occasionally revert to liquid form. He had about five liquid stools last night, disrupting his sleep. The diarrhea began midway through his treatment, particularly after the fourth cycle of chemotherapy. He has been prescribed Lomotil, which he initially did not take regularly due to it not being included in his pill tray. He is now taking it twice a day. He has 18 pills left and will need a refill soon. A stool test for C. diff was conducted and results are pending. There is a concern about a possible viral cause for the diarrhea, as his family had a similar illness recently.  He has experienced weight loss, body aches, and numbness in his toes since the beginning of his treatment. Despite these symptoms, he was able to mow the lawn recently, which exacerbated his joint aches. He is scheduled for a PET scan in about a month to assess his lymphoma status.  He is on Biktarvy for HIV management. His CD4 count was 55 at  the last check, and his blood work, including kidney and liver function tests, is stable.   He uses lorazepam, taking two pills at night, to aid with sleep, although he did not sleep last night due to diarrhea.  I have reviewed the past medical history, past surgical history, social history and family history with the patient and they are unchanged from previous  note.  HISTORY OF PRESENT ILLNESS:   63 y.o. gentleman with past medical history of HIV disease, depression, hypothyroidism, recent syncopal episode in November 2024 for which he was hospitalized from 05/11/2023 until 05/12/2023 with grossly negative TTE and CT head workup, was admitted to the hospital again on 05/17/2023 after he presented with dizziness, headaches.  CT angiogram of the head and neck on 05/17/2023 showed large right nasopharyngeal skull base mass which was suboptimally evaluated but was worrisome for nasopharyngeal carcinoma.  He was admitted for further evaluation and management.  Eventual workup revealed large B-cell lymphoma, high-grade, with multiple liver lesions and biopsies from stomach positive for non-Hodgkin lymphoma.  Stage IV disease.   ENT evaluated him and performed biopsy of right nasopharyngeal mass on 05/19/2023.  We were consulted for any additional recommendations.   MR angiogram of the head without IV contrast on 05/11/2023 showed 3 mm outpouching extending posteriorly and laterally from the left ICA terminus, potentially reflecting a vascular infundibulum versus aneurysm.  Otherwise normal intracranial MRA.   CT soft tissue of the neck on 05/18/2023 showed large right nasopharyngeal mass that extended inferiorly to the level of the greater horn of the hyoid bone on the right.  This mass involves right-sided adenoid and palatine tonsils, right parapharyngeal space, right masticator space, right parotid space.  Superiorly this most extended to the level of foreman ovale bilaterally.  A 9 mm hypodense lesion in the left parotid gland was noted, unclear if necrotic lymph node versus intraparotid cyst.  On 05/19/2023, ENT performed excision of nasopharyngeal mass.  Pathology showed respiratory mucosa with chronic sinusitis and prominent secondary follicle/germinal center formation consistent with the presence of tonsillar type tissue.  Negative for malignancy.  On  05/20/2023, CT scan of the chest showed multiple liver lesions, concerning for metastatic disease, at least 15 lesions.  It also showed upper abdominal abnormal lymph nodes.  On 05/23/2023, CT abdomen and pelvis showed similar findings in the liver and gastrohepatic lymph nodes.  Focal wall thickening of the mid gastric lumen with also greater at the lesser curvature of stomach.  Left lung base lung nodule measuring 6 mm.  On 05/22/2023, GI performed upper endoscopy for further evaluation of anemia.  Esophageal mucosal changes classified as Barrett's stage C2-M3.  LA grade D esophagitis.  Esophageal plaques were found, consistent with candidiasis.  Nonbleeding gastric ulcer which was biopsied.  Pathology from stomach ulcer biopsy came back positive for non-Hodgkin's lymphoma, large cell type.  Esophageal biopsy showed Barrett's esophagus.  On 05/23/2023, he underwent core needle biopsy of liver lesions.  Pathology showed high-grade B-cell lymphoma, large cell type.  Ki-67 was more than 50%. FISH testing positive for BCL6 and negative for Bcl-2 and C-MYC.  Staging PET/CT performed on 06/05/2023: 5.4 cm right nasopharyngeal mass, likely corresponding to the patient's known lymphoma.Multifocal pulmonary lymphoma.  Innumerable hepatic metastases. Associated cervical, upper abdominal, and mesenteric nodal implants. Osseous involvement in the left sacrum/pelvis.  Started chemotherapy with R-CHOP from 06/09/2023.  After completion of 3 cycles of R-CHOP, restaging PET/CT on 08/02/2023 showed complete response to therapy, Deauville 1.  This is consistent  with excellent response and plan is to continue R-CHOP for total of 6 cycles.  Oncology History  High grade B-cell lymphoma (HCC)  05/31/2023 Initial Diagnosis   DLBCL (diffuse large B cell lymphoma) (HCC)   06/05/2023 Cancer Staging   Staging form: Hodgkin and Non-Hodgkin Lymphoma, AJCC 8th Edition - Clinical: Stage IV (Diffuse large B-cell lymphoma) -  Signed by Meryl Crutch, MD on 06/05/2023 Stage prefix: Initial diagnosis   06/09/2023 -  Chemotherapy   Patient is on Treatment Plan : NON-HODGKINS LYMPHOMA R-CHOP q21d         REVIEW OF SYSTEMS:   Review of Systems - Oncology  All other pertinent systems were reviewed with the patient and are negative.  ALLERGIES: He is allergic to sulfonamide derivatives.  MEDICATIONS:  Current Outpatient Medications  Medication Sig Dispense Refill   bictegravir-emtricitabine-tenofovir AF (BIKTARVY) 50-200-25 MG TABS tablet Take 1 tablet by mouth daily. 30 tablet 11   dapsone 100 MG tablet Take 1 tablet (100 mg total) by mouth daily. 30 tablet 5   fluconazole (DIFLUCAN) 200 MG tablet Take 1 tablet (200 mg total) by mouth daily. 30 tablet 5   lidocaine-prilocaine (EMLA) cream Apply to affected area once 30 g 3   LORazepam (ATIVAN) 1 MG tablet Take 1 tablet (1 mg total) by mouth 2 (two) times daily as needed for anxiety. 60 tablet 0   morphine (MSIR) 15 MG tablet Take 1 tablet (15 mg total) by mouth every 6 (six) hours as needed for severe pain (pain score 7-10). 30 tablet 0   ondansetron (ZOFRAN) 8 MG tablet Take 1 tablet (8 mg total) by mouth every 8 (eight) hours as needed for nausea or vomiting. Start on the third day after cyclophosphamide chemotherapy. 30 tablet 1   pantoprazole (PROTONIX) 40 MG tablet Take 1 tablet (40 mg total) by mouth 2 (two) times daily before a meal. 60 tablet 5   predniSONE (DELTASONE) 20 MG tablet Take 5 tablets (100 mg total) by mouth daily. Take with food on days 1-5 of chemotherapy. 25 tablet 5   prochlorperazine (COMPAZINE) 10 MG tablet Take 1 tablet (10 mg total) by mouth every 6 (six) hours as needed for nausea or vomiting. 30 tablet 6   traMADol (ULTRAM) 50 MG tablet Take 1 tablet (50 mg total) by mouth 3 (three) times daily as needed for moderate pain (pain score 4-6). 30 tablet 0   diphenoxylate-atropine (LOMOTIL) 2.5-0.025 MG tablet Take 1 tablet by mouth 4  (four) times daily as needed for diarrhea or loose stools. 40 tablet 1   No current facility-administered medications for this visit.   Facility-Administered Medications Ordered in Other Visits  Medication Dose Route Frequency Provider Last Rate Last Admin   0.9 %  sodium chloride infusion   Intravenous Continuous Izek Corvino, MD 10 mL/hr at 09/22/23 1211 New Bag at 09/22/23 1211   heparin lock flush 100 unit/mL  500 Units Intracatheter Once PRN Latise Dilley, MD       sodium chloride flush (NS) 0.9 % injection 10 mL  10 mL Intracatheter PRN Woodley Petzold, MD         VITALS:   Blood pressure (!) 129/90, pulse 85, temperature 97.7 F (36.5 C), temperature source Temporal, resp. rate 14, weight 165 lb (74.8 kg), SpO2 97%.  Wt Readings from Last 3 Encounters:  09/22/23 165 lb (74.8 kg)  09/12/23 165 lb (74.8 kg)  09/01/23 175 lb 12.8 oz (79.7 kg)    Body mass index is 24.37 kg/m.  Onc Performance Status - 09/22/23 1112       ECOG Perf Status   ECOG Perf Status Fully active, able to carry on all pre-disease performance without restriction      KPS SCALE   KPS % SCORE Normal activity with effort, some s/s of disease                PHYSICAL EXAM:   Physical Exam Constitutional:      General: He is not in acute distress.    Appearance: Normal appearance.  HENT:     Head: Normocephalic and atraumatic.  Eyes:     General: No scleral icterus.    Conjunctiva/sclera: Conjunctivae normal.  Cardiovascular:     Rate and Rhythm: Normal rate and regular rhythm.     Heart sounds: Normal heart sounds.  Pulmonary:     Effort: Pulmonary effort is normal.     Breath sounds: Normal breath sounds.  Abdominal:     General: There is no distension.  Musculoskeletal:     Right lower leg: No edema.     Left lower leg: No edema.  Lymphadenopathy:     Cervical: Cervical adenopathy: resolved.  Neurological:     General: No focal deficit present.     Mental Status: He is  alert and oriented to person, place, and time.  Psychiatric:     Comments: In good spirits today      LABORATORY DATA:   I have reviewed the data as listed.  Results for orders placed or performed in visit on 09/22/23  CD4 count  Result Value Ref Range   CD4 T Cell Abs <35 (L) 400 - 1,790 /uL   CD4 % Helper T Cell 5 (L) 33 - 65 %  CMP (Cancer Center only)  Result Value Ref Range   Sodium 139 135 - 145 mmol/L   Potassium 3.6 3.5 - 5.1 mmol/L   Chloride 108 98 - 111 mmol/L   CO2 24 22 - 32 mmol/L   Glucose, Bld 110 (H) 70 - 99 mg/dL   BUN 14 8 - 23 mg/dL   Creatinine 0.86 5.78 - 1.24 mg/dL   Calcium 9.0 8.9 - 46.9 mg/dL   Total Protein 7.2 6.5 - 8.1 g/dL   Albumin 4.3 3.5 - 5.0 g/dL   AST 31 15 - 41 U/L   ALT 33 0 - 44 U/L   Alkaline Phosphatase 92 38 - 126 U/L   Total Bilirubin 0.5 0.0 - 1.2 mg/dL   GFR, Estimated >62 >95 mL/min   Anion gap 7 5 - 15  CBC with Differential (Cancer Center Only)  Result Value Ref Range   WBC Count 7.2 4.0 - 10.5 K/uL   RBC 3.10 (L) 4.22 - 5.81 MIL/uL   Hemoglobin 9.8 (L) 13.0 - 17.0 g/dL   HCT 28.4 (L) 13.2 - 44.0 %   MCV 96.8 80.0 - 100.0 fL   MCH 31.6 26.0 - 34.0 pg   MCHC 32.7 30.0 - 36.0 g/dL   RDW 10.2 (H) 72.5 - 36.6 %   Platelet Count 170 150 - 400 K/uL   nRBC 0.0 0.0 - 0.2 %   Neutrophils Relative % 87 %   Neutro Abs 6.3 1.7 - 7.7 K/uL   Lymphocytes Relative 8 %   Lymphs Abs 0.6 (L) 0.7 - 4.0 K/uL   Monocytes Relative 3 %   Monocytes Absolute 0.2 0.1 - 1.0 K/uL   Eosinophils Relative 0 %   Eosinophils Absolute 0.0 0.0 -  0.5 K/uL   Basophils Relative 1 %   Basophils Absolute 0.1 0.0 - 0.1 K/uL   Immature Granulocytes 1 %   Abs Immature Granulocytes 0.04 0.00 - 0.07 K/uL      RADIOGRAPHIC STUDIES:  I have personally reviewed the radiological images as listed and agree with the findings in the report.  No results found.    CODE STATUS:  Code Status History     Date Active Date Inactive Code Status Order ID  Comments User Context   05/29/2023 0850 05/30/2023 2252 Full Code [id number]  Maryln Gottron, MD ED   05/17/2023 1906 05/24/2023 1834 Full Code [id number]  Rometta Emery, MD Inpatient   05/17/2023 1532 05/17/2023 1906 Full Code [id number]  Nolberto Hanlon, MD ED   05/11/2023 2024 05/12/2023 2308 Full Code [id number]  Hillary Bow, DO Inpatient   07/01/2018 1624 07/03/2018 1537 Full Code [id number]  Dorcas Carrow, MD Inpatient   02/10/2014 2054 02/12/2014 0031 Full Code [id number]  Christen Bame, MD Inpatient    Questions for Most Recent Historical Code Status (Order [id number])     Question Answer   By: Consent: discussion documented in EHR            Orders Placed This Encounter  Procedures   NM PET Image Restag (PS) Skull Base To Thigh    Standing Status:   Future    Expected Date:   10/22/2023    Expiration Date:   09/21/2024    If indicated for the ordered procedure, I authorize the administration of a radiopharmaceutical per Radiology protocol:   Yes    Preferred imaging location?:   [facility]     Future Appointments  Date Time Provider Department Center  09/25/2023  2:00 PM [facility] None     This document was completed utilizing speech recognition software. Grammatical errors, random word insertions, pronoun errors, and incomplete sentences are an occasional consequence of this system due to software limitations, ambient noise, and hardware issues. Any formal questions or concerns about the content, text or information contained within the body of this dictation should be directly addressed to the provider for clarification.

## 2023-09-22 NOTE — Assessment & Plan Note (Signed)
 Difficulty sleeping, likely exacerbated by prednisone. Requests sleep aid. - Sent prescription for Ativan to [facility] pharmacy

## 2023-09-22 NOTE — Patient Instructions (Addendum)
 [facility] - A DEPT OF [facility]  Discharge Instructions: Thank you for choosing [facility] to provide your oncology and hematology care.   If you have a lab appointment with the Cancer Center, please go directly to the Cancer Center and check in at the registration area.   Wear comfortable clothing and clothing appropriate for easy access to any Portacath or PICC line.   We strive to give you quality time with your provider. You may need to reschedule your appointment if you arrive late (15 or more minutes).  Arriving late affects you and other patients whose appointments are after yours.  Also, if you miss three or more appointments without notifying the office, you may be dismissed from the clinic at the provider's discretion.      For prescription refill requests, have your pharmacy contact our office and allow 72 hours for refills to be completed.    Today you received the following chemotherapy and/or immunotherapy agents: Adriamycin/Oncovin/Cytoxan/Rituxan      To help prevent nausea and vomiting after your treatment, we encourage you to take your nausea medication as directed.  BELOW ARE SYMPTOMS THAT SHOULD BE REPORTED IMMEDIATELY: *FEVER GREATER THAN 100.4 F (38 C) OR HIGHER *CHILLS OR SWEATING *NAUSEA AND VOMITING THAT IS NOT CONTROLLED WITH YOUR NAUSEA MEDICATION *UNUSUAL SHORTNESS OF BREATH *UNUSUAL BRUISING OR BLEEDING *URINARY PROBLEMS (pain or burning when urinating, or frequent urination) *BOWEL PROBLEMS (unusual diarrhea, constipation, pain near the anus) TENDERNESS IN MOUTH AND THROAT WITH OR WITHOUT PRESENCE OF ULCERS (sore throat, sores in mouth, or a toothache) UNUSUAL RASH, SWELLING OR PAIN  UNUSUAL VAGINAL DISCHARGE OR ITCHING   Items with * indicate a potential emergency and should be followed up as soon as possible or go to the Emergency Department if any problems should occur.  Please show the CHEMOTHERAPY  ALERT CARD or IMMUNOTHERAPY ALERT CARD at check-in to the Emergency Department and triage nurse.  Should you have questions after your visit or need to cancel or reschedule your appointment, please contact [facility] - A DEPT OF [facility]  Dept: [phone]  and follow the prompts.  Office hours are 8:00 a.m. to 4:30 p.m. Monday - Friday. Please note that voicemails left after 4:00 p.m. may not be returned until the following business day.  We are closed weekends and major holidays. You have access to a nurse at all times for urgent questions. Please call the main number to the clinic Dept: [phone] and follow the prompts.   For any non-urgent questions, you may also contact your provider using MyChart. We now offer e-Visits for anyone 65 and older to request care online for non-urgent symptoms. For details visit [email or web address].   Also download the MyChart app! Go to the app store, search "MyChart", open the app, select [facility], and log in with your MyChart username and password.

## 2023-09-22 NOTE — Assessment & Plan Note (Addendum)
 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 FISH testing positive for BCL6 and negative for Bcl-2 and C-MYC.  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 Associated cervical, upper abdominal, and mesenteric nodal implants. Osseous involvement in the left sacrum/pelvis.   -Previously I discussed PET/CT findings with the patient, his sister and also showed them the images.  This will serve as a good baseline.  -As patient was complaining of recent onset headaches and tremors, we obtained CSF analysis to make sure there is no lymphoma involvement.  Underwent LP on 06/08/2023.  Cytology from CSF fluid was negative.  Imaging studies showed no gross abnormalities intracranially.  -Hepatitis B core antibody positive and surface antigen negative, likely from past infection.  Previously I discussed his case with his infectious disease doctor Dr. Luciana Axe.  Since patient is on Symtuza, he does not need additional treatment for this. HBV DNA PCR was negative on 06/09/2023.  -He started chemotherapy with R-CHOP on 06/09/2023.  He has been tolerating chemotherapy well.  - After completion of 3 cycles of R-CHOP, restaging PET/CT on 08/02/2023 showed complete response to therapy, Deauville 1.  This is consistent with excellent response and plan is to continue R-CHOP for total of 6 cycles.  Today I reviewed PET images and compared to previous PET scan.  Also showed images to patient and his friend, his sister who were accompanying.   -Labs today showed white count of 7200 with ANC of 6300.  Hemoglobin stable at 9.8, platelet count stable at 170,000.  CMP showed creatinine of 1.16, otherwise grossly unremarkable.  No dose-limiting toxicities.  -Due for cycle 6  of R-CHOP today.  Will proceed with chemotherapy without dose reductions.  He will receive Neulasta on day 3.  -We will monitor his CD4 count periodically.  Results from today are pending.  Today will be the last of the planned chemotherapy.  Restaging PET scan will be obtained in approximately a month from now and I will see him in clinic with results.  Will continue surveillance as per NCCN guidelines going forward.

## 2023-09-22 NOTE — Assessment & Plan Note (Addendum)
 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  Could also be from his medications.  -Iron studies grossly unremarkable recently.  B12, folate are within normal limits.  Will monitor this during treatments.  Hemoglobin is overall stable at  9.8 today.

## 2023-09-22 NOTE — Assessment & Plan Note (Signed)
 Intermittent diarrhea since August 04, 2023, following the second PET scan. Frequency and severity vary, with some days having no episodes and others multiple, including nocturnal diarrhea. Possible relation to recent change in HIV medication to [facility], though unconfirmed. Symptoms managed with Lomotil and Imodium, which help control frequency when taken regularly. - Continue Lomotil 3-4 times daily. - Use Imodium as needed, starting with two tablets at the first sign of loose stools. - Ensure adequate hydration.

## 2023-09-22 NOTE — Assessment & Plan Note (Signed)
-   Currently under reasonable control.  Has close follow-up with ID, Dr.Comer.  His HIV therapy was previously switched to [employer].

## 2023-09-25 ENCOUNTER — Telehealth: Payer: Self-pay | Admitting: Oncology

## 2023-09-25 ENCOUNTER — Inpatient Hospital Stay: Payer: Medicare Other

## 2023-09-25 VITALS — BP 112/71 | HR 77 | Temp 98.8°F | Resp 16

## 2023-09-25 DIAGNOSIS — C8510 Unspecified B-cell lymphoma, unspecified site: Secondary | ICD-10-CM

## 2023-09-25 DIAGNOSIS — Z5111 Encounter for antineoplastic chemotherapy: Secondary | ICD-10-CM | POA: Diagnosis not present

## 2023-09-25 MED ORDER — PEGFILGRASTIM-FPGK 6 MG/0.6ML ~~LOC~~ SOSY
6.0000 mg | PREFILLED_SYRINGE | Freq: Once | SUBCUTANEOUS | Status: AC
Start: 2023-09-25 — End: 2023-09-25
  Administered 2023-09-25: 6 mg via SUBCUTANEOUS

## 2023-09-25 NOTE — Telephone Encounter (Signed)
 [employer] forms for [facility] c diff test has been faxed over to [employer].

## 2023-09-25 NOTE — Telephone Encounter (Signed)
 Patient scheduled appointments. Patient is aware of all appointment details.

## 2023-09-25 NOTE — Patient Instructions (Signed)

## 2023-09-26 ENCOUNTER — Other Ambulatory Visit: Payer: Self-pay

## 2023-10-16 ENCOUNTER — Other Ambulatory Visit: Payer: Self-pay | Admitting: Oncology

## 2023-10-16 DIAGNOSIS — F5109 Other insomnia not due to a substance or known physiological condition: Secondary | ICD-10-CM

## 2023-10-17 ENCOUNTER — Other Ambulatory Visit: Payer: Self-pay

## 2023-10-17 DIAGNOSIS — R197 Diarrhea, unspecified: Secondary | ICD-10-CM

## 2023-10-18 ENCOUNTER — Other Ambulatory Visit: Payer: Self-pay

## 2023-10-18 ENCOUNTER — Encounter: Payer: Self-pay | Admitting: Oncology

## 2023-10-19 ENCOUNTER — Other Ambulatory Visit: Payer: Self-pay

## 2023-10-23 ENCOUNTER — Encounter (HOSPITAL_COMMUNITY)
Admission: RE | Admit: 2023-10-23 | Discharge: 2023-10-23 | Disposition: A | Discharge status: Home / Self Care | Source: Ambulatory Visit | Attending: Oncology | Admitting: Oncology

## 2023-10-23 DIAGNOSIS — I2510 Atherosclerotic heart disease of native coronary artery without angina pectoris: Secondary | ICD-10-CM | POA: Insufficient documentation

## 2023-10-23 DIAGNOSIS — I77810 Thoracic aortic ectasia: Secondary | ICD-10-CM | POA: Diagnosis not present

## 2023-10-23 DIAGNOSIS — C8510 Unspecified B-cell lymphoma, unspecified site: Secondary | ICD-10-CM | POA: Insufficient documentation

## 2023-10-23 DIAGNOSIS — Z9221 Personal history of antineoplastic chemotherapy: Secondary | ICD-10-CM | POA: Insufficient documentation

## 2023-10-23 LAB — GLUCOSE, CAPILLARY: Glucose-Capillary: 106 mg/dL — ABNORMAL HIGH (ref 70–99)

## 2023-10-23 MED ORDER — FLUDEOXYGLUCOSE F - 18 (FDG) INJECTION
8.2100 | Freq: Once | INTRAVENOUS | Status: AC
  Administered 2023-10-23: 8.21 via INTRAVENOUS

## 2023-10-24 ENCOUNTER — Other Ambulatory Visit: Payer: Self-pay | Admitting: Internal Medicine

## 2023-10-24 ENCOUNTER — Other Ambulatory Visit: Payer: Self-pay

## 2023-10-24 DIAGNOSIS — R197 Diarrhea, unspecified: Secondary | ICD-10-CM

## 2023-10-24 DIAGNOSIS — B20 Human immunodeficiency virus [HIV] disease: Secondary | ICD-10-CM

## 2023-10-24 NOTE — Telephone Encounter (Signed)
 Okay to refill?

## 2023-10-27 ENCOUNTER — Telehealth: Payer: Self-pay

## 2023-10-27 NOTE — Telephone Encounter (Signed)
 Contacted patient and patient verbalized understanding of negative [employer] c diff stool test results.

## 2023-11-01 ENCOUNTER — Ambulatory Visit: Payer: Self-pay | Admitting: Oncology

## 2023-11-01 NOTE — Progress Notes (Signed)
 The ASCVD Risk score (Arnett DK, et al., 2019) failed to calculate for the following reasons:   The valid total cholesterol range is 130 to 320 mg/dL  Arlon Bergamo, BSN, RN

## 2023-11-03 ENCOUNTER — Encounter: Payer: Self-pay | Admitting: Nurse Practitioner

## 2023-11-03 ENCOUNTER — Other Ambulatory Visit (HOSPITAL_BASED_OUTPATIENT_CLINIC_OR_DEPARTMENT_OTHER): Payer: Self-pay

## 2023-11-16 ENCOUNTER — Other Ambulatory Visit: Payer: Self-pay

## 2023-11-16 DIAGNOSIS — D649 Anemia, unspecified: Secondary | ICD-10-CM

## 2023-11-16 DIAGNOSIS — C8510 Unspecified B-cell lymphoma, unspecified site: Secondary | ICD-10-CM

## 2023-11-16 DIAGNOSIS — J392 Other diseases of pharynx: Secondary | ICD-10-CM

## 2023-11-17 ENCOUNTER — Encounter: Payer: Self-pay | Admitting: Oncology

## 2023-11-17 ENCOUNTER — Inpatient Hospital Stay: Attending: Oncology

## 2023-11-17 ENCOUNTER — Inpatient Hospital Stay (HOSPITAL_BASED_OUTPATIENT_CLINIC_OR_DEPARTMENT_OTHER): Admitting: Oncology

## 2023-11-17 DIAGNOSIS — F5109 Other insomnia not due to a substance or known physiological condition: Secondary | ICD-10-CM | POA: Diagnosis not present

## 2023-11-17 DIAGNOSIS — D649 Anemia, unspecified: Secondary | ICD-10-CM

## 2023-11-17 DIAGNOSIS — Z9221 Personal history of antineoplastic chemotherapy: Secondary | ICD-10-CM | POA: Diagnosis not present

## 2023-11-17 DIAGNOSIS — C8338 Diffuse large B-cell lymphoma, lymph nodes of multiple sites: Secondary | ICD-10-CM | POA: Diagnosis present

## 2023-11-17 DIAGNOSIS — Z79899 Other long term (current) drug therapy: Secondary | ICD-10-CM | POA: Insufficient documentation

## 2023-11-17 DIAGNOSIS — R410 Disorientation, unspecified: Secondary | ICD-10-CM | POA: Insufficient documentation

## 2023-11-17 DIAGNOSIS — B20 Human immunodeficiency virus [HIV] disease: Secondary | ICD-10-CM

## 2023-11-17 DIAGNOSIS — C8510 Unspecified B-cell lymphoma, unspecified site: Secondary | ICD-10-CM | POA: Diagnosis not present

## 2023-11-17 DIAGNOSIS — J392 Other diseases of pharynx: Secondary | ICD-10-CM

## 2023-11-17 DIAGNOSIS — Z91148 Patient's other noncompliance with medication regimen for other reason: Secondary | ICD-10-CM | POA: Insufficient documentation

## 2023-11-17 DIAGNOSIS — Z95828 Presence of other vascular implants and grafts: Secondary | ICD-10-CM

## 2023-11-17 LAB — CBC WITH DIFFERENTIAL (CANCER CENTER ONLY)
Abs Immature Granulocytes: 0.01 10*3/uL (ref 0.00–0.07)
Basophils Absolute: 0.1 10*3/uL (ref 0.0–0.1)
Basophils Relative: 2 %
Eosinophils Absolute: 0.2 10*3/uL (ref 0.0–0.5)
Eosinophils Relative: 6 %
HCT: 32.7 % — ABNORMAL LOW (ref 39.0–52.0)
Hemoglobin: 11.1 g/dL — ABNORMAL LOW (ref 13.0–17.0)
Immature Granulocytes: 0 %
Lymphocytes Relative: 33 %
Lymphs Abs: 1.2 10*3/uL (ref 0.7–4.0)
MCH: 30.7 pg (ref 26.0–34.0)
MCHC: 33.9 g/dL (ref 30.0–36.0)
MCV: 90.6 fL (ref 80.0–100.0)
Monocytes Absolute: 0.6 10*3/uL (ref 0.1–1.0)
Monocytes Relative: 17 %
Neutro Abs: 1.5 10*3/uL — ABNORMAL LOW (ref 1.7–7.7)
Neutrophils Relative %: 42 %
Platelet Count: 116 10*3/uL — ABNORMAL LOW (ref 150–400)
RBC: 3.61 MIL/uL — ABNORMAL LOW (ref 4.22–5.81)
RDW: 13.5 % (ref 11.5–15.5)
WBC Count: 3.6 10*3/uL — ABNORMAL LOW (ref 4.0–10.5)
nRBC: 0 % (ref 0.0–0.2)

## 2023-11-17 LAB — CMP (CANCER CENTER ONLY)
ALT: 27 U/L (ref 0–44)
AST: 33 U/L (ref 15–41)
Albumin: 4.5 g/dL (ref 3.5–5.0)
Alkaline Phosphatase: 109 U/L (ref 38–126)
Anion gap: 7 (ref 5–15)
BUN: 22 mg/dL (ref 8–23)
CO2: 29 mmol/L (ref 22–32)
Calcium: 9.4 mg/dL (ref 8.9–10.3)
Chloride: 103 mmol/L (ref 98–111)
Creatinine: 1.31 mg/dL — ABNORMAL HIGH (ref 0.61–1.24)
GFR, Estimated: >60 mL/min (ref >60)
Glucose, Bld: 88 mg/dL (ref 70–99)
Potassium: 3.6 mmol/L (ref 3.5–5.1)
Sodium: 139 mmol/L (ref 135–145)
Total Bilirubin: 0.7 mg/dL (ref 0.0–1.2)
Total Protein: 7.6 g/dL (ref 6.5–8.1)

## 2023-11-17 MED ORDER — SODIUM CHLORIDE 0.9% FLUSH
10.0000 mL | Freq: Once | INTRAVENOUS | Status: AC
  Administered 2023-11-17: 10 mL

## 2023-11-17 MED ORDER — LORAZEPAM 1 MG PO TABS: 1.0000 mg | ORAL_TABLET | Freq: Two times a day (BID) | ORAL | 0 refills | Status: DC | PRN

## 2023-11-17 MED ORDER — HEPARIN SOD (PORK) LOCK FLUSH 100 UNIT/ML IV SOLN
500.0000 [IU] | Freq: Once | INTRAVENOUS | Status: AC
  Administered 2023-11-17: 500 [IU]

## 2023-11-17 NOTE — Assessment & Plan Note (Addendum)
-   Currently under reasonable control.  Has close follow-up with ID, previously Dr.Comer.  He will be establishing with Dr. Ernie Heal soon.  His HIV therapy was previously switched to Biktarvy .    - Continue Biktarvy  and dapsone . - Stop fluconazole  unless signs of oral thrush appear. - Follow up with new HIV specialist, Dr. Ernie Heal, on June 18 for further management of HIV and CD4 count monitoring.

## 2023-11-17 NOTE — Assessment & Plan Note (Addendum)
 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 FISH testing positive for BCL6 and negative for Bcl-2 and C-MYC.  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 Associated cervical, upper abdominal, and mesenteric nodal implants. Osseous involvement in the left sacrum/pelvis.   -Previously I discussed PET/CT findings with the patient, his sister and also showed them the images.  This will serve as a good baseline.  -As patient was complaining of recent onset headaches and tremors, we obtained CSF analysis to make sure there is no lymphoma involvement.  Underwent LP on 06/08/2023.  Cytology from CSF fluid was negative.  Imaging studies showed no gross abnormalities intracranially.  -Hepatitis B core antibody positive and surface antigen negative, likely from past infection.  Previously I discussed his case with his infectious disease doctor Dr. Seymour Dapper.  Since patient is on Symtuza , he does not need additional treatment for this. HBV DNA PCR was negative on 06/09/2023.  -He started chemotherapy with R-CHOP on 06/09/2023.  He has been tolerating chemotherapy well.  - After completion of 3 cycles of R-CHOP, restaging PET/CT on 08/02/2023 showed complete response to therapy, Deauville 1.  This was consistent with excellent response and plan made to continue R-CHOP for total of 6 cycles.  Completed 6 cycles as of 09/22/2023.   Restaging PET scan on 10/23/2023 showed complete response to therapy, Deauville 1.  Discussed results with the patient and his sister who was accompanying today.  Plan to continue surveillance as per NCCN guidelines.  Labs and physical exam will be obtained every 3 months and CT scans will be obtained every 6 months for the first 2  years and then annually thereafter.

## 2023-11-17 NOTE — Progress Notes (Signed)
 [facility]  ONCOLOGY CLINIC PROGRESS NOTE   Patient Care Team: Pcp, No as PCP - General Alanna Alley, LCSW as Counselor Comer, Judithann Novas, MD as Consulting Physician (Infectious Diseases) Arlo Berber, MD as Consulting Physician (Hematology and Oncology)  PATIENT NAME: Jordan Calderon   MR#: [id number] DOB: September 23, 1960  Date of visit: 11/17/2023   ASSESSMENT & PLAN:   Jordan Calderon is a 63 y.o. gentleman with past medical history of HIV disease, depression, hypothyroidism, recent syncopal episode in November 2024 for which he was hospitalized from 05/11/2023 until 05/12/2023 with grossly negative TTE and CT head workup, was admitted to the hospital again on 05/17/2023 after he presented with dizziness, headaches.  CT angiogram of the head and neck on 05/17/2023 showed large right nasopharyngeal skull base mass which was suboptimally evaluated but was worrisome for nasopharyngeal carcinoma.  He was admitted for further evaluation and management.  Eventual workup revealed large B-cell lymphoma, high-grade, with multiple liver lesions and biopsies from stomach positive for non-Hodgkin lymphoma.  Stage IV disease.  FISH testing positive for BCL6 and negative for Bcl-2 and C-MYC.  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 FISH testing positive for BCL6 and negative for Bcl-2 and C-MYC.  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 Associated cervical, upper abdominal, and mesenteric nodal implants. Osseous involvement in the left sacrum/pelvis.   -Previously I discussed PET/CT findings with the patient, his sister and also showed them the images.  This will serve as a good  baseline.  -As patient was complaining of recent onset headaches and tremors, we obtained CSF analysis to make sure there is no lymphoma involvement.  Underwent LP on 06/08/2023.  Cytology from CSF fluid was negative.  Imaging studies showed no gross abnormalities intracranially.  -Hepatitis B core antibody positive and surface antigen negative, likely from past infection.  Previously I discussed his case with his infectious disease doctor Dr. Seymour Dapper.  Since patient is on Symtuza , he does not need additional treatment for this. HBV DNA PCR was negative on 06/09/2023.  -He started chemotherapy with R-CHOP on 06/09/2023.  He has been tolerating chemotherapy well.  - After completion of 3 cycles of R-CHOP, restaging PET/CT on 08/02/2023 showed complete response to therapy, Deauville 1.  This was consistent with excellent response and plan made to continue R-CHOP for total of 6 cycles.  Completed 6 cycles as of 09/22/2023.   Restaging PET scan on 10/23/2023 showed complete response to therapy, Deauville 1.  Discussed results with the patient and his sister who was accompanying today.  Plan to continue surveillance as per NCCN guidelines.  Labs and physical exam will be obtained every 3 months and CT scans will be obtained every 6 months for the first 2 years and then annually thereafter.  Human immunodeficiency virus (HIV) disease (HCC) - Currently under reasonable control.  Has close follow-up with ID, previously Dr.Comer.  He will be establishing with Dr. Ernie Heal soon.  His HIV therapy was previously switched to Biktarvy .    - Continue Biktarvy  and dapsone . - Stop fluconazole  unless signs of oral thrush appear. - Follow up with new HIV specialist, Dr. Ernie Heal, on June 18 for further management of HIV and CD4 count monitoring.  Medication non-adherence Medication non-adherence with recent reports  of not taking medications for up to a week. Medications are scattered and not organized, contributing to  confusion and non-adherence. Plan to consolidate medications to improve adherence. - Consolidate medications to reduce confusion and improve adherence. - Ensure all medications, except those needed at night, are taken in the morning. - Send prescription for lorazepam  to pharmacy.  Periodic confusion Reports of periodic confusion and foggy-headedness, possibly related to medication non-adherence or HIV-related cognitive effects.  Will try to avoid polypharmacy.   I reviewed lab results and outside records for this visit and discussed relevant results with the patient. Diagnosis, plan of care and treatment options were also discussed in detail with the patient. Opportunity provided to ask questions and answers provided to his apparent satisfaction. Provided instructions to call our clinic with any problems, questions or concerns prior to return visit. I recommended to continue follow-up with PCP and sub-specialists. He verbalized understanding and agreed with the plan.   NCCN guidelines have been consulted in the planning of this patient's care.  I spent a total of 30 minutes during this encounter with the patient including review of chart and various tests results, discussions about plan of care and coordination of care plan.   Arlo Berber, MD  11/17/2023 4:59 PM  [facility] - [facility] DEPT [facility] [address] [place] [address] Dept: [phone] Dept Fax: [phone]    CHIEF COMPLAINT/ REASON FOR VISIT:   Recently diagnosed high-grade B-cell lymphoma, large cell type, stage IV disease with multiple liver lesions. FISH testing positive for BCL6 and negative for Bcl-2 and C-MYC.  Current Treatment: R-CHOP started from 06/09/2023 and completed 6 cycles as of 09/22/2023.   INTERVAL HISTORY:    He was accompanied by his sister today, who is very supportive.  His sister lives in [place], [place].   Discussed the  use of AI scribe software for clinical note transcription with the patient, who gave verbal consent to proceed.  History of Present Illness Jordan Calderon is a 63 year old male with lymphoma and HIV who presents for follow-up after a PET scan. He is accompanied by his caregiver, who is also his spouse.  The recent PET scan showed no evidence of lymphoma, indicating remission. Continued surveillance is necessary, but future monitoring will be done with CT scans instead of PET scans.  He has a history of HIV and struggles with medication adherence. His caregiver reports he has not taken any medication for about three days, possibly up to a week, and often discontinues his medication without a clear reason. This pattern of non-adherence was also noted during his previous care. His caregiver is concerned about the complexity of his medication regimen and suggests that a simpler regimen might improve adherence.  He experiences episodes of confusion and fogginess, which his caregiver attributes to either his HIV affecting his brain or his reluctance to disclose non-adherence to medication. His blood pressure was unusually high this morning, which is atypical for him.  His current medication regimen includes pantoprazole  twice a day, Biktarvy , fluconazole , dapsone , and lorazepam  at night. However, his medications are disorganized, with some pills in unlabelled bottles and others scattered, contributing to his non-adherence. He is currently out of lorazepam , which he takes for anxiety.  Recent blood work shows a creatinine level of 1.3, indicating slight dehydration, a white blood cell count of 3,600, and platelets at 116, both slightly low. His hemoglobin is improving, currently  at 11.1, up from previous levels of 9.8. His CD4 count has been very low, previously less than 30, which is concerning to his caregiver.   I have reviewed the past medical history, past surgical history, social history and family  history with the patient and they are unchanged from previous note.  HISTORY OF PRESENT ILLNESS:   63 y.o. gentleman with past medical history of HIV disease, depression, hypothyroidism, recent syncopal episode in November 2024 for which he was hospitalized from 05/11/2023 until 05/12/2023 with grossly negative TTE and CT head workup, was admitted to the hospital again on 05/17/2023 after he presented with dizziness, headaches.  CT angiogram of the head and neck on 05/17/2023 showed large right nasopharyngeal skull base mass which was suboptimally evaluated but was worrisome for nasopharyngeal carcinoma.  He was admitted for further evaluation and management.  Eventual workup revealed large B-cell lymphoma, high-grade, with multiple liver lesions and biopsies from stomach positive for non-Hodgkin lymphoma.  Stage IV disease.   ENT evaluated him and performed biopsy of right nasopharyngeal mass on 05/19/2023.  We were consulted for any additional recommendations.   MR angiogram of the head without IV contrast on 05/11/2023 showed 3 mm outpouching extending posteriorly and laterally from the left ICA terminus, potentially reflecting a vascular infundibulum versus aneurysm.  Otherwise normal intracranial MRA.   CT soft tissue of the neck on 05/18/2023 showed large right nasopharyngeal mass that extended inferiorly to the level of the greater horn of the hyoid bone on the right.  This mass involves right-sided adenoid and palatine tonsils, right parapharyngeal space, right masticator space, right parotid space.  Superiorly this most extended to the level of foreman ovale bilaterally.  A 9 mm hypodense lesion in the left parotid gland was noted, unclear if necrotic lymph node versus intraparotid cyst.  On 05/19/2023, ENT performed excision of nasopharyngeal mass.  Pathology showed respiratory mucosa with chronic sinusitis and prominent secondary follicle/germinal center formation consistent with the presence  of tonsillar type tissue.  Negative for malignancy.  On 05/20/2023, CT scan of the chest showed multiple liver lesions, concerning for metastatic disease, at least 15 lesions.  It also showed upper abdominal abnormal lymph nodes.  On 05/23/2023, CT abdomen and pelvis showed similar findings in the liver and gastrohepatic lymph nodes.  Focal wall thickening of the mid gastric lumen with also greater at the lesser curvature of stomach.  Left lung base lung nodule measuring 6 mm.  On 05/22/2023, GI performed upper endoscopy for further evaluation of anemia.  Esophageal mucosal changes classified as Barrett's stage C2-M3.  LA grade D esophagitis.  Esophageal plaques were found, consistent with candidiasis.  Nonbleeding gastric ulcer which was biopsied.  Pathology from stomach ulcer biopsy came back positive for non-Hodgkin's lymphoma, large cell type.  Esophageal biopsy showed Barrett's esophagus.  On 05/23/2023, he underwent core needle biopsy of liver lesions.  Pathology showed high-grade B-cell lymphoma, large cell type.  Ki-67 was more than 50%. FISH testing positive for BCL6 and negative for Bcl-2 and C-MYC.  Staging PET/CT performed on 06/05/2023: 5.4 cm right nasopharyngeal mass, likely corresponding to the patient's known lymphoma.Multifocal pulmonary lymphoma.  Innumerable hepatic metastases. Associated cervical, upper abdominal, and mesenteric nodal implants. Osseous involvement in the left sacrum/pelvis.  Started chemotherapy with R-CHOP from 06/09/2023.  After completion of 3 cycles of R-CHOP, restaging PET/CT on 08/02/2023 showed complete response to therapy, Deauville 1.  This was consistent with excellent response and plan made to continue R-CHOP for total of 6  cycles.  Completed 6 cycles as of 09/22/2023. Restaging PET scan on 10/23/2023 showed complete response to therapy, Deauville 1.   Plan to continue surveillance as per NCCN guidelines.   Oncology History  High grade B-cell lymphoma  (HCC)  05/31/2023 Initial Diagnosis   DLBCL (diffuse large B cell lymphoma) (HCC)   06/05/2023 Cancer Staging   Staging form: Hodgkin and Non-Hodgkin Lymphoma, AJCC 8th Edition - Clinical: Stage IV (Diffuse large B-cell lymphoma) - Signed by Arlo Berber, MD on 06/05/2023 Stage prefix: Initial diagnosis   06/09/2023 -  Chemotherapy   Patient is on Treatment Plan : NON-HODGKINS LYMPHOMA R-CHOP q21d         REVIEW OF SYSTEMS:   Review of Systems - Oncology  All other pertinent systems were reviewed with the patient and are negative.  ALLERGIES: He is allergic to sulfonamide derivatives.  MEDICATIONS:  Current Outpatient Medications  Medication Sig Dispense Refill   bictegravir-emtricitabine -tenofovir  AF (BIKTARVY ) 50-200-25 MG TABS tablet Take 1 tablet by mouth daily. 30 tablet 11   dapsone  100 MG tablet TAKE 1 TABLET(100 MG) BY MOUTH DAILY 30 tablet 5   diphenoxylate -atropine  (LOMOTIL ) 2.5-0.025 MG tablet Take 1 tablet by mouth 4 (four) times daily as needed for diarrhea or loose stools. 40 tablet 1   lidocaine -prilocaine  (EMLA ) cream Apply to affected area once 30 g 3   LORazepam  (ATIVAN ) 1 MG tablet Take 1 tablet (1 mg total) by mouth 2 (two) times daily as needed for anxiety. 60 tablet 0   pantoprazole  (PROTONIX ) 40 MG tablet Take 1 tablet (40 mg total) by mouth 2 (two) times daily before a meal. 60 tablet 5   No current facility-administered medications for this visit.     VITALS:   There were no vitals taken for this visit.  Wt Readings from Last 3 Encounters:  09/22/23 165 lb (74.8 kg)  09/12/23 165 lb (74.8 kg)  09/01/23 175 lb 12.8 oz (79.7 kg)    There is no height or weight on file to calculate BMI.   Onc Performance Status - 11/17/23 1100       ECOG Perf Status   ECOG Perf Status Fully active, able to carry on all pre-disease performance without restriction      KPS SCALE   KPS % SCORE Normal activity with effort, some s/s of disease               PHYSICAL EXAM:   Physical Exam Constitutional:      General: He is not in acute distress.    Appearance: Normal appearance.  HENT:     Head: Normocephalic and atraumatic.  Eyes:     General: No scleral icterus.    Conjunctiva/sclera: Conjunctivae normal.  Cardiovascular:     Rate and Rhythm: Normal rate and regular rhythm.     Heart sounds: Normal heart sounds.  Pulmonary:     Effort: Pulmonary effort is normal.     Breath sounds: Normal breath sounds.  Abdominal:     General: There is no distension.  Musculoskeletal:     Right lower leg: No edema.     Left lower leg: No edema.  Lymphadenopathy:     Cervical: Cervical adenopathy: resolved.  Neurological:     General: No focal deficit present.     Mental Status: He is alert and oriented to person, place, and time.  Psychiatric:     Comments: In not so good spirits today      LABORATORY DATA:   I have  reviewed the data as listed.  Results for orders placed or performed in visit on 11/17/23  CMP (Cancer Center only)  Result Value Ref Range   Sodium 139 135 - 145 mmol/L   Potassium 3.6 3.5 - 5.1 mmol/L   Chloride 103 98 - 111 mmol/L   CO2 29 22 - 32 mmol/L   Glucose, Bld 88 70 - 99 mg/dL   BUN 22 8 - 23 mg/dL   Creatinine 8.11 (H) 9.14 - 1.24 mg/dL   Calcium  9.4 8.9 - 10.3 mg/dL   Total Protein 7.6 6.5 - 8.1 g/dL   Albumin 4.5 3.5 - 5.0 g/dL   AST 33 15 - 41 U/L   ALT 27 0 - 44 U/L   Alkaline Phosphatase 109 38 - 126 U/L   Total Bilirubin 0.7 0.0 - 1.2 mg/dL   GFR, Estimated >78 >29 mL/min   Anion gap 7 5 - 15  CBC with Differential (Cancer Center Only)  Result Value Ref Range   WBC Count 3.6 (L) 4.0 - 10.5 K/uL   RBC 3.61 (L) 4.22 - 5.81 MIL/uL   Hemoglobin 11.1 (L) 13.0 - 17.0 g/dL   HCT 56.2 (L) 13.0 - 86.5 %   MCV 90.6 80.0 - 100.0 fL   MCH 30.7 26.0 - 34.0 pg   MCHC 33.9 30.0 - 36.0 g/dL   RDW 78.4 69.6 - 29.5 %   Platelet Count 116 (L) 150 - 400 K/uL   nRBC 0.0 0.0 - 0.2 %   Neutrophils  Relative % 42 %   Neutro Abs 1.5 (L) 1.7 - 7.7 K/uL   Lymphocytes Relative 33 %   Lymphs Abs 1.2 0.7 - 4.0 K/uL   Monocytes Relative 17 %   Monocytes Absolute 0.6 0.1 - 1.0 K/uL   Eosinophils Relative 6 %   Eosinophils Absolute 0.2 0.0 - 0.5 K/uL   Basophils Relative 2 %   Basophils Absolute 0.1 0.0 - 0.1 K/uL   Immature Granulocytes 0 %   Abs Immature Granulocytes 0.01 0.00 - 0.07 K/uL      RADIOGRAPHIC STUDIES:  I have personally reviewed the radiological images as listed and agree with the findings in the report.  NM PET Image Restag (PS) Skull Base To Thigh Result Date: 10/30/2023 CLINICAL DATA:  Subsequent treatment strategy for high-grade B-cell lymphoma. Status post chemotherapy. EXAM: NUCLEAR MEDICINE PET SKULL BASE TO THIGH TECHNIQUE: 8.21 mCi F-18 FDG was injected intravenously. Full-ring PET imaging was performed from the skull base to thigh after the radiotracer. CT data was obtained and used for attenuation correction and anatomic localization. Fasting blood glucose: 106 mg/dl COMPARISON:  PET-CT 28/41/3244 and older. FINDINGS: Mediastinal blood pool activity: SUV max 1.5. Liver activity: SUV max 2.1 NECK: No specific abnormal radiotracer uptake seen in the neck including along lymph node change of the submandibular, posterior triangle internal jugular region. No abnormal pharyngeal uptake. On initial presentation there was a hypermetabolic mass in the right hypopharynx which is again resolved today as on the prior. Physiologic uptake along areas of muscles of mastication. Near symmetric uptake of the visualized intracranial compartment. Incidental CT findings: The parotid glands, submandibular glands unremarkable. Small thyroid  gland. Paranasal sinuses and mastoid air cells are clear. No abnormal lymph node enlargement identified on the noncontrast attenuation correction CT in the neck. CHEST: No abnormal uptake above blood pool in the axillary regions, hilum or mediastinum. No  abnormal lung uptake today. Incidental CT findings: Right IJ chest port. Tip of the catheter extends to  the SVC right atrial junction region. Heart is nonenlarged. No significant pericardial effusion. Coronary artery calcifications are seen. Slight ectasia of the ascending aorta measuring up to 4.4 cm. Similar to previous. Slightly patulous thoracic esophagus. There is some small mediastinal nodes identified such as right paratracheal. Again these are not hypermetabolic and for example node on image 64 series 4 measures 6 mm in short axis, normal in size. Breathing motion. No consolidation, pneumothorax or effusion. No edema. ABDOMEN/PELVIS: Physiologic distribution radiotracer along the parenchymal organs, bowel and renal collecting systems. No specific abnormal uptake of the spleen. Spleen has an AP dimension of 12.4 cm today and previously 12.5 cm. Slight asymmetric uptake along the left adrenal gland, unchanged from previous and nonspecific. The adrenal gland has a normal appearance on CT. Incidental CT findings: Large bowel is normal course and caliber with scattered stool. Small bowel is nondilated. Significant debris in the stomach. No abnormal calcifications are seen within either kidney nor along the course of either ureter. Small fat containing umbilical on inguinal hernias. Diffuse vascular calcifications. SKELETON: The previous diffuse marrow uptake has improved. This could have been related to treatment effect. No new areas of abnormal uptake along the visualized osseous structures. This includes in the area of the previous remote area of uptake along the left iliac bone. Incidental CT findings: Scattered degenerative changes. IMPRESSION: No specific areas of abnormal radiotracer uptake at this time to suggest recurrent disease. Deauville score 1 Improved diffuse marrow uptake. This may have been posttreatment effect. 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 Recommend annual imaging followup by CTA or  MRA. This recommendation follows 2010 ACCF/AHA/AATS/ACR/ASA/SCA/SCAI/SIR/STS/SVM Guidelines for the Diagnosis and Management of Patients with Thoracic Aortic Disease. Circulation. 2010; 121: Z610-R604. Aortic aneurysm NOS (ICD10-I71.9) Coronary artery calcifications are also seen. Please correlate for other coronary risk factors. Electronically Signed   By: Adrianna Horde M.D.   On: 10/30/2023 11:10    CODE STATUS:  Code Status History     Date Active Date Inactive Code Status Order ID Comments User Context   05/29/2023 0850 05/30/2023 2252 Full Code [id number]  Gaylin Ke, MD ED   05/17/2023 1906 05/24/2023 1834 Full Code [id number]  Davida Espy, MD Inpatient   05/17/2023 1532 05/17/2023 1906 Full Code [id number]  Bennie Brave, MD ED   05/11/2023 2024 05/12/2023 2308 Full Code [id number]  Dea Evert, DO Inpatient   07/01/2018 1624 07/03/2018 1537 Full Code [id number]  Vada Garibaldi, MD Inpatient   02/10/2014 2054 02/12/2014 0031 Full Code [id number]  Collier Deacon, MD Inpatient    Questions for Most Recent Historical Code Status (Order [id number])     Question Answer   By: Consent: discussion documented in EHR            Orders Placed This Encounter  Procedures   CBC with Differential (Cancer Center Only)    Standing Status:   Future    Expected Date:   02/17/2024    Expiration Date:   11/16/2024   CMP (Cancer Center only)    Standing Status:   Future    Expected Date:   02/17/2024    Expiration Date:   11/16/2024   Lactate dehydrogenase    Standing Status:   Future    Expected Date:   02/17/2024    Expiration Date:   11/16/2024     Future Appointments  Date Time Provider Department Center  12/06/2023 11:15 AM Charolette Copier, MD [facility]  01/18/2024  9:20 AM Armbruster,  Lendon Queen, MD [facility]  02/15/2024 10:45 AM [facility] None  02/15/2024 11:15 AM Hareem Surowiec, Gale Jude, MD [facility] None     This document was completed utilizing speech  recognition software. Grammatical errors, random word insertions, pronoun errors, and incomplete sentences are an occasional consequence of this system due to software limitations, ambient noise, and hardware issues. Any formal questions or concerns about the content, text or information contained within the body of this dictation should be directly addressed to the provider for clarification.

## 2023-11-18 ENCOUNTER — Other Ambulatory Visit: Payer: Self-pay

## 2023-11-21 ENCOUNTER — Ambulatory Visit: Admitting: Infectious Disease

## 2023-11-23 ENCOUNTER — Ambulatory Visit: Admitting: Nurse Practitioner

## 2023-11-24 ENCOUNTER — Other Ambulatory Visit: Payer: Self-pay

## 2023-12-05 ENCOUNTER — Encounter: Payer: Self-pay | Admitting: Infectious Disease

## 2023-12-05 DIAGNOSIS — E785 Hyperlipidemia, unspecified: Secondary | ICD-10-CM | POA: Insufficient documentation

## 2023-12-05 NOTE — Progress Notes (Signed)
 Subjective:  Chief complaint: follow-up for HIV disease on medications   Patient ID: Jordan Calderon, male    DOB: Jun 12, 1961, 63 y.o.   MRN: [id number]  HPI  Discussed the use of AI scribe software for clinical note transcription with the patient, who gave verbal consent to proceed.  History of Present Illness   Jordan Calderon is a 63 year old male with HIV and lymphoma who presents for follow-up of his HIV management and lymphoma remission. He was previously followed by Dr. Seymour Dapper for his HIV management.  HIV is well controlled on Biktarvy , with a history of non-adherence but currently compliant. CD4 count was less than 35 in April 2025 while receiving chemotherapy. He is also taking dapsone . Hepatitis B core antibody is positive, covered with tenofovir  as part of his HIV regimen.  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 He is under surveillance with CT scans every three months. A port remains in place from chemotherapy treatment.       Past Medical History:  Diagnosis Date   Anxiety    Cellulitis 02/11/2014   LEFT ARM   Depression    Headache(784.0)    HIV (human immunodeficiency virus infection) (HCC)    Hyperlipidemia 12/05/2023   Lymphoma (HCC)     Past Surgical History:  Procedure Laterality Date   ARM HARDWARE REMOVAL Left    arm surgery     BIOPSY  05/22/2023   Procedure: BIOPSY;  Surgeon: Ace Holder, MD;  Location: [facility];  Service: Gastroenterology;;   ESOPHAGOGASTRODUODENOSCOPY (EGD) WITH PROPOFOL  N/A 05/22/2023   Procedure: ESOPHAGOGASTRODUODENOSCOPY (EGD) WITH PROPOFOL ;  Surgeon: Ace Holder, MD;  Location: [facility] ENDOSCOPY;  Service: Gastroenterology;  Laterality: N/A;   HEMORRHOID SURGERY     IR IMAGING GUIDED PORT INSERTION  06/06/2023   NASOPHARYNGEAL BIOPSY N/A 05/19/2023   Procedure: NASOPHARYNGEAL BIOPSY;  Surgeon: Reynold Caves, MD;  Location: [facility] OR;  Service: ENT;  Laterality: N/A;    Family  History  Adopted: Yes      Social History   Socioeconomic History   Marital status: Single    Spouse name: Not on file   Number of children: 0   Years of education: Not on file   Highest education level: Not on file  Occupational History   Not on file  Tobacco Use   Smoking status: Former    Current packs/day: 0.00    Average packs/day: 0.1 packs/day for 30.0 years (3.0 ttl pk-yrs)    Types: Cigarettes    Start date: 11/18/1984    Quit date: 11/19/2014    Years since quitting: 9.0   Smokeless tobacco: Never  Vaping Use   Vaping status: Never Used  Substance and Sexual Activity   Alcohol use: No   Drug use: Not Currently   Sexual activity: Not Currently    Birth control/protection: None    Comment: refused condoms  Other Topics Concern   Not on file  Social History Narrative   Not on file   Social Drivers of Health   Financial Resource Strain: Not on file  Food Insecurity: No Food Insecurity (05/29/2023)   Hunger Vital Sign    Worried About Running Out of Food in the Last Year: Never true    Ran Out of Food in the Last Year: Never true  Transportation Needs: No Transportation Needs (05/29/2023)   PRAPARE - Administrator, Civil Service (Medical): No    Lack of Transportation (  Non-Medical): No  Physical Activity: Not on file  Stress: Not on file  Social Connections: Not on file    Allergies  Allergen Reactions   Sulfonamide Derivatives Hives and Itching     Current Outpatient Medications:    bictegravir-emtricitabine -tenofovir  AF (BIKTARVY ) 50-200-25 MG TABS tablet, Take 1 tablet by mouth daily., Disp: 30 tablet, Rfl: 11   dapsone  100 MG tablet, TAKE 1 TABLET(100 MG) BY MOUTH DAILY, Disp: 30 tablet, Rfl: 5   diphenoxylate -atropine  (LOMOTIL ) 2.5-0.025 MG tablet, Take 1 tablet by mouth 4 (four) times daily as needed for diarrhea or loose stools., Disp: 40 tablet, Rfl: 1   lidocaine -prilocaine  (EMLA ) cream, Apply to affected area once, Disp: 30 g, Rfl:  3   LORazepam  (ATIVAN ) 1 MG tablet, Take 1 tablet (1 mg total) by mouth 2 (two) times daily as needed for anxiety., Disp: 60 tablet, Rfl: 0   pantoprazole  (PROTONIX ) 40 MG tablet, Take 1 tablet (40 mg total) by mouth 2 (two) times daily before a meal., Disp: 60 tablet, Rfl: 5   Review of Systems  Constitutional:  Negative for activity change, appetite change, chills, diaphoresis, fatigue, fever and unexpected weight change.  HENT:  Negative for congestion, rhinorrhea, sinus pressure, sneezing, sore throat and trouble swallowing.   Eyes:  Negative for photophobia and visual disturbance.  Respiratory:  Negative for cough, chest tightness, shortness of breath, wheezing and stridor.   Cardiovascular:  Negative for chest pain, palpitations and leg swelling.  Gastrointestinal:  Negative for abdominal distention, abdominal pain, anal bleeding, blood in stool, constipation, diarrhea, nausea and vomiting.  Genitourinary:  Negative for difficulty urinating, dysuria, flank pain and hematuria.  Musculoskeletal:  Negative for arthralgias, back pain, gait problem, joint swelling and myalgias.  Skin:  Negative for color change, pallor, rash and wound.  Neurological:  Negative for dizziness, tremors, weakness and light-headedness.  Hematological:  Negative for adenopathy. Does not bruise/bleed easily.  Psychiatric/Behavioral:  Negative for agitation, behavioral problems, confusion, decreased concentration, dysphoric mood and sleep disturbance. The patient is nervous/anxious.        Objective:   Physical Exam Constitutional:      Appearance: He is well-developed.  HENT:     Head: Normocephalic and atraumatic.   Eyes:     Conjunctiva/sclera: Conjunctivae normal.    Cardiovascular:     Rate and Rhythm: Normal rate and regular rhythm.  Pulmonary:     Effort: Pulmonary effort is normal. No respiratory distress.     Breath sounds: No wheezing.  Abdominal:     General: There is no distension.      Palpations: Abdomen is soft.   Musculoskeletal:        General: No tenderness. Normal range of motion.     Cervical back: Normal range of motion and neck supple.   Skin:    General: Skin is warm and dry.     Coloration: Skin is not pale.     Findings: No erythema or rash.   Neurological:     General: No focal deficit present.     Mental Status: He is alert and oriented to person, place, and time.   Psychiatric:        Mood and Affect: Mood normal.        Behavior: Behavior normal.        Thought Content: Thought content normal.        Judgment: Judgment normal.           Assessment & Plan:   Assessment and Plan  HIV infection, well controlled HIV well controlled on Biktarvy . Discussed increased cardiovascular risk and inflammation associated with HIV. Recommended atorvastatin based on [employer] study showing 35% reduction in cardiovascular events. (it sounds like he was actually in this study himself) - Continue Biktarvy . - Start atorvastatin for cardiovascular risk reduction.  Low CD4 count due to chemotherapy CD4 count <35 in April, likely due to chemotherapy-induced bone marrow suppression. Emphasized importance of dapsone  for PCP prophylaxis. - Continue dapsone  for PCP prophylaxis. - Recheck CD4 count.  Lymphoma in remission Lymphoma in remission post R-CHOP chemotherapy. Surveillance with CT scans every three months ongoing. - Continue surveillance with CT scans every three months.  Anemia, unspecified Anemia likely related to chemotherapy. Differential includes effects of HIV, chemotherapy, and other causes like mycobacterium avium. Plan to evaluate with blood work. - Order CBC to evaluate current blood counts.  Hepatitis B core + Hepatitis B core antibody positive, TAF is on board  Abnormal anal pap smear Previous abnormal anal pap smear with deferred workup due to chemotherapy. Recommended follow-up with high-resolution anoscopy post-chemotherapy. -  Contact Doctor Andy Bannister to schedule high-resolution anoscopy.      Anxiety: right now his oncologist is rx ativan . Dr. Daina Drum did in the past and I would be willing to consider him to be someone who is grand fathered in and whom I could rx it to (we do not rx to pts new to the medicine) but my preference would be for oncology or if he establishes with PCP for them to rx

## 2023-12-06 ENCOUNTER — Other Ambulatory Visit: Payer: Self-pay

## 2023-12-06 ENCOUNTER — Ambulatory Visit (INDEPENDENT_AMBULATORY_CARE_PROVIDER_SITE_OTHER): Admitting: Infectious Disease

## 2023-12-06 ENCOUNTER — Encounter: Payer: Self-pay | Admitting: Infectious Disease

## 2023-12-06 VITALS — BP 140/90 | HR 88 | Temp 97.7°F | Ht 69.0 in | Wt 161.0 lb

## 2023-12-06 DIAGNOSIS — C8510 Unspecified B-cell lymphoma, unspecified site: Secondary | ICD-10-CM | POA: Diagnosis not present

## 2023-12-06 DIAGNOSIS — Z95828 Presence of other vascular implants and grafts: Secondary | ICD-10-CM

## 2023-12-06 DIAGNOSIS — R85610 Atypical squamous cells of undetermined significance on cytologic smear of anus (ASC-US): Secondary | ICD-10-CM

## 2023-12-06 DIAGNOSIS — E785 Hyperlipidemia, unspecified: Secondary | ICD-10-CM

## 2023-12-06 DIAGNOSIS — B20 Human immunodeficiency virus [HIV] disease: Secondary | ICD-10-CM | POA: Diagnosis present

## 2023-12-06 DIAGNOSIS — F419 Anxiety disorder, unspecified: Secondary | ICD-10-CM

## 2023-12-06 DIAGNOSIS — R768 Other specified abnormal immunological findings in serum: Secondary | ICD-10-CM | POA: Diagnosis not present

## 2023-12-06 MED ORDER — BIKTARVY 50-200-25 MG PO TABS: 1.0000 | ORAL_TABLET | Freq: Every day | ORAL | 11 refills | Status: DC

## 2023-12-06 MED ORDER — ATORVASTATIN CALCIUM 20 MG PO TABS: 20.0000 mg | ORAL_TABLET | Freq: Every day | ORAL | 11 refills | Status: DC

## 2023-12-06 MED ORDER — DAPSONE 100 MG PO TABS: 100.0000 mg | ORAL_TABLET | Freq: Every day | ORAL | 5 refills | Status: DC

## 2023-12-07 ENCOUNTER — Other Ambulatory Visit: Payer: Self-pay

## 2023-12-07 ENCOUNTER — Ambulatory Visit: Payer: Self-pay

## 2023-12-08 ENCOUNTER — Encounter: Payer: Self-pay | Admitting: Oncology

## 2023-12-08 LAB — HIV-1 RNA QUANT-NO REFLEX-BLD
HIV 1 RNA Quant: NOT DETECTED {copies}/mL
HIV-1 RNA Quant, Log: NOT DETECTED {Log_copies}/mL

## 2023-12-08 LAB — CBC WITH DIFFERENTIAL/PLATELET
Absolute Lymphocytes: 978 {cells}/uL (ref 850–3900)
Absolute Monocytes: 465 {cells}/uL (ref 200–950)
Basophils Absolute: 39 {cells}/uL (ref 0–200)
Basophils Relative: 1.3 %
Eosinophils Absolute: 129 {cells}/uL (ref 15–500)
Eosinophils Relative: 4.3 %
HCT: 37.8 % — ABNORMAL LOW (ref 38.5–50.0)
Hemoglobin: 12 g/dL — ABNORMAL LOW (ref 13.2–17.1)
MCH: 30 pg (ref 27.0–33.0)
MCHC: 31.7 g/dL — ABNORMAL LOW (ref 32.0–36.0)
MCV: 94.5 fL (ref 80.0–100.0)
MPV: 10.2 fL (ref 7.5–12.5)
Monocytes Relative: 15.5 %
Neutro Abs: 1389 {cells}/uL — ABNORMAL LOW (ref 1500–7800)
Neutrophils Relative %: 46.3 %
Platelets: 143 10*3/uL (ref 140–400)
RBC: 4 10*6/uL — ABNORMAL LOW (ref 4.20–5.80)
RDW: 12.5 % (ref 11.0–15.0)
Total Lymphocyte: 32.6 %
WBC: 3 10*3/uL — ABNORMAL LOW (ref 3.8–10.8)

## 2023-12-08 LAB — COMPLETE METABOLIC PANEL WITHOUT GFR
AG Ratio: 1.7 (calc) (ref 1.0–2.5)
ALT: 35 U/L (ref 9–46)
AST: 33 U/L (ref 10–35)
Albumin: 5 g/dL (ref 3.6–5.1)
Alkaline phosphatase (APISO): 118 U/L (ref 35–144)
BUN: 24 mg/dL (ref 7–25)
CO2: 24 mmol/L (ref 20–32)
Calcium: 9.4 mg/dL (ref 8.6–10.3)
Chloride: 106 mmol/L (ref 98–110)
Creat: 1.29 mg/dL (ref 0.70–1.35)
Globulin: 2.9 g/dL (ref 1.9–3.7)
Glucose, Bld: 84 mg/dL (ref 65–99)
Potassium: 4.2 mmol/L (ref 3.5–5.3)
Sodium: 139 mmol/L (ref 135–146)
Total Bilirubin: 0.6 mg/dL (ref 0.2–1.2)
Total Protein: 7.9 g/dL (ref 6.1–8.1)

## 2023-12-08 LAB — T PALLIDUM AB: T Pallidum Abs: POSITIVE — AB

## 2023-12-08 LAB — RPR TITER: RPR Titer: 1:4 {titer} — ABNORMAL HIGH

## 2023-12-08 LAB — RPR: RPR Ser Ql: REACTIVE — AB

## 2023-12-08 LAB — T-HELPER CELLS (CD4) COUNT (NOT AT ARMC)
Absolute CD4: 71 {cells}/uL — ABNORMAL LOW (ref 490–1740)
CD4 T Helper %: 6 % — ABNORMAL LOW (ref 30–61)
Total lymphocyte count: 1120 {cells}/uL (ref 850–3900)

## 2023-12-08 LAB — LDL CHOLESTEROL, DIRECT: Direct LDL: 101 mg/dL — ABNORMAL HIGH (ref <100)

## 2023-12-11 ENCOUNTER — Emergency Department (HOSPITAL_BASED_OUTPATIENT_CLINIC_OR_DEPARTMENT_OTHER)

## 2023-12-11 ENCOUNTER — Encounter (HOSPITAL_BASED_OUTPATIENT_CLINIC_OR_DEPARTMENT_OTHER): Payer: Self-pay

## 2023-12-11 ENCOUNTER — Emergency Department (HOSPITAL_BASED_OUTPATIENT_CLINIC_OR_DEPARTMENT_OTHER)
Admission: EM | Admit: 2023-12-11 | Discharge: 2023-12-12 | Disposition: A | Discharge status: Home / Self Care | Attending: Emergency Medicine | Admitting: Emergency Medicine

## 2023-12-11 ENCOUNTER — Telehealth: Payer: Self-pay

## 2023-12-11 ENCOUNTER — Other Ambulatory Visit: Payer: Self-pay

## 2023-12-11 DIAGNOSIS — Z8572 Personal history of non-Hodgkin lymphomas: Secondary | ICD-10-CM | POA: Diagnosis not present

## 2023-12-11 DIAGNOSIS — G4459 Other complicated headache syndrome: Secondary | ICD-10-CM | POA: Insufficient documentation

## 2023-12-11 DIAGNOSIS — Z21 Asymptomatic human immunodeficiency virus [HIV] infection status: Secondary | ICD-10-CM

## 2023-12-11 DIAGNOSIS — B20 Human immunodeficiency virus [HIV] disease: Secondary | ICD-10-CM | POA: Insufficient documentation

## 2023-12-11 DIAGNOSIS — R519 Headache, unspecified: Secondary | ICD-10-CM | POA: Diagnosis present

## 2023-12-11 LAB — COMPREHENSIVE METABOLIC PANEL WITH GFR
ALT: 37 U/L (ref 0–44)
AST: 35 U/L (ref 15–41)
Albumin: 4.7 g/dL (ref 3.5–5.0)
Alkaline Phosphatase: 134 U/L — ABNORMAL HIGH (ref 38–126)
Anion gap: 16 — ABNORMAL HIGH (ref 5–15)
BUN: 21 mg/dL (ref 8–23)
CO2: 22 mmol/L (ref 22–32)
Calcium: 9.2 mg/dL (ref 8.9–10.3)
Chloride: 103 mmol/L (ref 98–111)
Creatinine, Ser: 1.21 mg/dL (ref 0.61–1.24)
GFR, Estimated: >60 mL/min (ref >60)
Glucose, Bld: 91 mg/dL (ref 70–99)
Potassium: 3.6 mmol/L (ref 3.5–5.1)
Sodium: 141 mmol/L (ref 135–145)
Total Bilirubin: 0.5 mg/dL (ref 0.0–1.2)
Total Protein: 7.4 g/dL (ref 6.5–8.1)

## 2023-12-11 LAB — CBC WITH DIFFERENTIAL/PLATELET
Abs Immature Granulocytes: 0.01 10*3/uL (ref 0.00–0.07)
Basophils Absolute: 0.1 10*3/uL (ref 0.0–0.1)
Basophils Relative: 2 %
Eosinophils Absolute: 0.1 10*3/uL (ref 0.0–0.5)
Eosinophils Relative: 4 %
HCT: 32.8 % — ABNORMAL LOW (ref 39.0–52.0)
Hemoglobin: 11.2 g/dL — ABNORMAL LOW (ref 13.0–17.0)
Immature Granulocytes: 0 %
Lymphocytes Relative: 38 %
Lymphs Abs: 1.2 10*3/uL (ref 0.7–4.0)
MCH: 30.6 pg (ref 26.0–34.0)
MCHC: 34.1 g/dL (ref 30.0–36.0)
MCV: 89.6 fL (ref 80.0–100.0)
Monocytes Absolute: 0.6 10*3/uL (ref 0.1–1.0)
Monocytes Relative: 18 %
Neutro Abs: 1.2 10*3/uL — ABNORMAL LOW (ref 1.7–7.7)
Neutrophils Relative %: 38 %
Platelets: 124 10*3/uL — ABNORMAL LOW (ref 150–400)
RBC: 3.66 MIL/uL — ABNORMAL LOW (ref 4.22–5.81)
RDW: 13.3 % (ref 11.5–15.5)
WBC: 3.2 10*3/uL — ABNORMAL LOW (ref 4.0–10.5)
nRBC: 0 % (ref 0.0–0.2)

## 2023-12-11 LAB — C-REACTIVE PROTEIN: CRP: 0.7 mg/dL (ref <1.0)

## 2023-12-11 LAB — SEDIMENTATION RATE: Sed Rate: 35 mm/h — ABNORMAL HIGH (ref 0–16)

## 2023-12-11 MED ORDER — METOCLOPRAMIDE HCL 5 MG/ML IJ SOLN
10.0000 mg | Freq: Once | INTRAMUSCULAR | Status: AC
  Administered 2023-12-11: 10 mg via INTRAVENOUS
  Filled 2023-12-11: qty 2

## 2023-12-11 MED ORDER — DIPHENHYDRAMINE HCL 50 MG/ML IJ SOLN
25.0000 mg | Freq: Once | INTRAMUSCULAR | Status: AC
  Administered 2023-12-11: 25 mg via INTRAVENOUS
  Filled 2023-12-11: qty 1

## 2023-12-11 MED ORDER — KETOROLAC TROMETHAMINE 30 MG/ML IJ SOLN
30.0000 mg | Freq: Once | INTRAMUSCULAR | Status: AC
  Administered 2023-12-11: 30 mg via INTRAVENOUS
  Filled 2023-12-11: qty 1

## 2023-12-11 NOTE — Plan of Care (Signed)
 Discussed briefly with Dr. Ismael, complex patient with treated HIV however low CD4 count in the setting of immunosuppression from chemotherapy for lymphoma which is also felt to be in remission, also secondary syphilis (previously treated with RPR titer of 1: 1 but now 1: 4 most recently), ascending aortic aneurysm of 4.4 cm, anxiety/depression on Xanax, gastric ulcer with hemorrhage, hyperlipidemia now presenting with worsening headache for which the differential is broad.  She is concerned about palpable/tender right temporal artery, ESR is 35  Recommendations: -Would hold off on steroids at this time due to lack of diagnostic clarity and the fact that steroid exposure can make evaluation of lymphoma less sensitive -Would start with MRI brain with and without contrast as well as MRI orbits with and without contrast -If MRI is concerning for recurrence of his malignancy this would be best further addressed by his oncologist -Pending MRI results and clinical course, may benefit from full neurological evaluation  Additional history Notes: Regarding his malignancy history in November 2024 he had headaches and dizziness with CTA head and neck revealing nasopharyngeal skull base mass found to be 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  He was started on R-CHOP 06/09/2023 with complete response seen after 3 cycles and he completed a total of 6 cycles as of 09/22/2023.  He is planned for every 3 months reimaging with last NM PET scan on 10/23/2023 continuing to demonstrate remission  Of note he did have a lumbar puncture completed as well on 06/08/2023 for headache and tremor which was negative on cytological evaluation, with CNS imaging studies also negative  On 11/17/2023 oncology progress note he was also reported to have medication nonadherence with recent reports of not taking medications for up to a week,  scattered and disorganized medications contributing to confusion and nonadherence.  Periodic confusion/foggy headedness was felt to be possibly related to medication nonadherence or HIV related cognitive effects

## 2023-12-11 NOTE — ED Provider Notes (Signed)
 [facility] Provider Note   CSN: [id number] Arrival date & time: 12/11/23  1631     Patient presents with: Headache   Jordan Calderon is a 63 y.o. male.   HPI Patient reports has had a headache for several weeks.  He reports the headache is severe at times.  He has tried Tylenol  and other over-the-counter medications without much relief.  Headache is mostly left-sided around the temple top of the head and left side of the face.  Patient denies associated symptoms.  He has not had any loss of vision or blurred vision.  No focal weakness numbness or tingling.  No gait instability although he does note that he at baseline sometimes gets up too quickly and is a little unsteady briefly.  There have been no injuries.  No falls, patient does not take any anticoagulation. Patient reports he was treated for lymphoma which was diagnosed after he presented for headaches that were worsening.  At that time he also had multiple liver lesions and was diagnosed non-Hodgkin's lymphoma.  Patient reports that he has not completed therapy and his most recent PET scan was negative but he is concerned that he has a similar now.  Patient denies he has had any fevers.  No chills no neck stiffness.  No nausea no vomiting.    Prior to Admission medications   Medication Sig Start Date End Date Taking? Authorizing Provider  atorvastatin (LIPITOR) 20 MG tablet Take 1 tablet (20 mg total) by mouth daily. 12/06/23   Fleeta Kathie Jomarie LOISE, MD  bictegravir-emtricitabine -tenofovir  AF (BIKTARVY ) 50-200-25 MG TABS tablet Take 1 tablet by mouth daily. 12/06/23   Fleeta Kathie Jomarie LOISE, MD  dapsone  100 MG tablet Take 1 tablet (100 mg total) by mouth daily. 12/06/23   Fleeta Kathie Jomarie LOISE, MD  diphenoxylate -atropine  (LOMOTIL ) 2.5-0.025 MG tablet Take 1 tablet by mouth 4 (four) times daily as needed for diarrhea or loose stools. Patient not taking: Reported on 12/06/2023 09/22/23   Autumn Millman, MD  lidocaine -prilocaine  (EMLA ) cream Apply to affected area once 06/02/23   Pasam, Avinash, MD  LORazepam  (ATIVAN ) 1 MG tablet Take 1 tablet (1 mg total) by mouth 2 (two) times daily as needed for anxiety. 11/17/23   Pasam, Millman, MD  pantoprazole  (PROTONIX ) 40 MG tablet Take 1 tablet (40 mg total) by mouth 2 (two) times daily before a meal. 06/20/23   Pasam, Avinash, MD    Allergies: Sulfonamide derivatives    Review of Systems  Updated Vital Signs BP (!) 162/98   Pulse 76   Temp 98.6 F (37 C) (Oral)   Resp 16   Ht 5' 9 (1.753 m)   Wt 73 kg   SpO2 94%   BMI 23.78 kg/m   Physical Exam Constitutional:      Comments: Alert nontoxic clear mental status.  No respiratory distress.  HENT:     Head: Normocephalic and atraumatic.     Comments: Patient endorses tenderness to palpation over the right temple on the right.  Patient does have a prominently palpable superficial temporal artery.  No erythema or swelling present.    Right Ear: Tympanic membrane normal.     Left Ear: Tympanic membrane normal.     Nose: Nose normal.     Mouth/Throat:     Mouth: Mucous membranes are moist.     Pharynx: Oropharynx is clear.   Eyes:     Extraocular Movements: Extraocular movements intact.  Conjunctiva/sclera: Conjunctivae normal.     Pupils: Pupils are equal, round, and reactive to light.    Cardiovascular:     Rate and Rhythm: Normal rate and regular rhythm.  Pulmonary:     Effort: Pulmonary effort is normal.     Breath sounds: Normal breath sounds.  Abdominal:     General: There is no distension.     Palpations: Abdomen is soft.     Tenderness: There is no abdominal tenderness. There is no guarding.   Musculoskeletal:        General: No swelling or tenderness. Normal range of motion.     Cervical back: Neck supple. No rigidity.     Right lower leg: No edema.     Left lower leg: No edema.  Lymphadenopathy:     Cervical: No cervical adenopathy.   Skin:     General: Skin is warm and dry.   Neurological:     General: No focal deficit present.     Mental Status: He is oriented to person, place, and time.     Cranial Nerves: No cranial nerve deficit.     Motor: No weakness.     Coordination: Coordination normal.     Comments: Patient mental status is clear.  Speech is clear.  No focal motor deficits.  Finger-nose exam is intact.  Patient does seem slightly slower delayed in processing commands but ultimately understands and executes as instructed.  Psychiatric:        Mood and Affect: Mood normal.     (all labs ordered are listed, but only abnormal results are displayed) Labs Reviewed  SEDIMENTATION RATE - Abnormal; Notable for the following components:      Result Value   Sed Rate 35 (*)    All other components within normal limits  CBC WITH DIFFERENTIAL/PLATELET - Abnormal; Notable for the following components:   WBC 3.2 (*)    RBC 3.66 (*)    Hemoglobin 11.2 (*)    HCT 32.8 (*)    Platelets 124 (*)    Neutro Abs 1.2 (*)    All other components within normal limits  COMPREHENSIVE METABOLIC PANEL WITH GFR - Abnormal; Notable for the following components:   Alkaline Phosphatase 134 (*)    Anion gap 16 (*)    All other components within normal limits  C-REACTIVE PROTEIN    EKG: None  Radiology: CT Head Wo Contrast Result Date: 12/11/2023 CLINICAL DATA:  History of large B-cell lymphoma, high-grade, positive for non Hodgkin's lymphoma with Mets presenting with worsening headache over 2 weeks. EXAM: CT HEAD WITHOUT CONTRAST TECHNIQUE: Contiguous axial images were obtained from the base of the skull through the vertex without intravenous contrast. RADIATION DOSE REDUCTION: This exam was performed according to the departmental dose-optimization program which includes automated exposure control, adjustment of the mA and/or kV according to patient size and/or use of iterative reconstruction technique. COMPARISON:  June 12, 2023  FINDINGS: Brain: No evidence of acute infarction, hemorrhage, hydrocephalus, extra-axial collection or mass lesion/mass effect. Vascular: No hyperdense vessel or unexpected calcification. Skull: Normal. Negative for fracture or focal lesion. Sinuses/Orbits: No acute finding. Other: None. IMPRESSION: 1. No acute intracranial pathology. 2. MRI correlation is recommended if clinical symptoms persist. Electronically Signed   By: Suzen Dials M.D.   On: 12/11/2023 19:14     Procedures   Medications Ordered in the ED  ketorolac  (TORADOL ) 30 MG/ML injection 30 mg (30 mg Intravenous Given 12/11/23 1747)  metoCLOPramide (REGLAN) injection 10 mg (  10 mg Intravenous Given 12/11/23 2230)  diphenhydrAMINE  (BENADRYL ) injection 25 mg (25 mg Intravenous Given 12/11/23 2230)                                    Medical Decision Making Amount and/or Complexity of Data Reviewed Labs: ordered. Radiology: ordered.  Risk Prescription drug management.   Patient presents as outlined with several weeks of persistent and/or worsening headache.  No fevers no chills.  No anticoagulation.  Patient does have history of non-Hodgkin's lymphoma in the nasopharyngeal mass.  These were successfully treated.  At this time given symptoms will proceed with CT head.  Other consideration given for temporal arteritis with focal tenderness palpation over the temple and prominent temporal artery.  Will obtain sed rate.  Will treat for pain and reassess.  CT head radiology no acute findings.  Sed rate 35 CRP 0.7 white count 3.2 metabolic panel normal except anion gap 16 normal GFR  Patient got moderate pain control from Toradol .  Headache did rebound after several hours patient also given dose of Reglan and Benadryl  with improvement again.  Neurology consult paged out to teleneurology.  A field person responded by gathering information.  Several hours later there was no return of call. I did repage.a consult, they did not have a  time for consult it was dependent on how many code strokes.  Ultimately called back at 11: 28 stating to have a monitor set up.  But at that time I canceled their consult as I had consulted with Dr. Jerrie and gotten additional guidance for management recommending MRI with and without contrast at [facility] and she would do consult once results available.  Patient has had headache for 2 weeks.  I have suspicion for temporal arteritis but also potential for other complications with history of HIV and non-Hodgkin's lymphoma.  Patient's mental status is clear.  He has not been febrile.  His neurologic exam is intact.  At this time patient is stable for transport by POV with his companion.  MRI order placed.  Dr. Patt accepts for ED to ED transfer.    Final diagnoses:  Other complicated headache syndrome  HIV infection, unspecified symptom status [facility])    ED Discharge Orders     None          Armenta Canning, MD 12/11/23 2339

## 2023-12-11 NOTE — ED Triage Notes (Addendum)
 Pt with a hx of CA with mets presents with ongoing HA for over 2 weeks. Pain is constant, but he does have sudden and intermittent worsening that waxes and wanes. Pt's friend to help provide HPI and states that the HA is how he presented when he first found out that he had CA. They called his oncologist who referred him to the ED. Denies any confusion, sz, weakness, fevers. Pt has taken APAP, Claritin , MS without relief of his pain.

## 2023-12-11 NOTE — Telephone Encounter (Signed)
 Patient has decided to go to the ED per Dr. Clovis recommendation for right sided head pain including right eye socket, temple and across the right side of head for duration of 2 weeks. Light sensitivity. 8-9/10 pain. Denies nausea. Patient's own pain medication has had little to no impact in controlling this pain per patient report.

## 2023-12-12 ENCOUNTER — Emergency Department (HOSPITAL_COMMUNITY)

## 2023-12-12 DIAGNOSIS — G4459 Other complicated headache syndrome: Secondary | ICD-10-CM | POA: Diagnosis not present

## 2023-12-12 MED ORDER — GADOBUTROL 1 MMOL/ML IV SOLN
7.0000 mL | Freq: Once | INTRAVENOUS | Status: AC | PRN
  Administered 2023-12-12: 7 mL via INTRAVENOUS

## 2023-12-12 NOTE — ED Notes (Signed)
 Pt left after MRI with IV still in arm and went to [facility]

## 2023-12-13 ENCOUNTER — Other Ambulatory Visit: Payer: Self-pay | Admitting: Oncology

## 2023-12-13 ENCOUNTER — Ambulatory Visit (HOSPITAL_BASED_OUTPATIENT_CLINIC_OR_DEPARTMENT_OTHER): Admitting: Oncology

## 2023-12-13 ENCOUNTER — Telehealth: Payer: Self-pay

## 2023-12-13 DIAGNOSIS — Z113 Encounter for screening for infections with a predominantly sexual mode of transmission: Secondary | ICD-10-CM

## 2023-12-13 DIAGNOSIS — C8510 Unspecified B-cell lymphoma, unspecified site: Secondary | ICD-10-CM

## 2023-12-13 DIAGNOSIS — Z9221 Personal history of antineoplastic chemotherapy: Secondary | ICD-10-CM

## 2023-12-13 DIAGNOSIS — C8338 Diffuse large B-cell lymphoma, lymph nodes of multiple sites: Secondary | ICD-10-CM | POA: Diagnosis not present

## 2023-12-13 MED ORDER — DEXAMETHASONE 4 MG PO TABS: 4.0000 mg | ORAL_TABLET | Freq: Two times a day (BID) | ORAL | 1 refills | Status: DC

## 2023-12-13 NOTE — Telephone Encounter (Signed)
 Patient states he is not currently sexually active. He would like tto be retested before treatment.  I have reached out to his friend Ladora per his request for transportation set up to get him back in for retesting.  Tonja did not answer and LVM for her to call back  Jordan Calderon ONEIDA Ligas, CMA

## 2023-12-13 NOTE — Telephone Encounter (Signed)
 Received a call from [facility]  with DIS regarding patients RPR titer. Please review.  RPR 1:4 previous RPR 1:1

## 2023-12-13 NOTE — Progress Notes (Signed)
 [facility]  HEMATOLOGY-ONCOLOGY ELECTRONIC VISIT PROGRESS NOTE  PATIENT NAME: DEMONTREZ Calderon   MR#: [id number] DOB: January 21, 1961  DATE OF SERVICE: 12/13/2023  Patient Care Team: Pcp, No as PCP - Diedre Liddie Kent, LCSW as Counselor Comer, Lamar ORN, MD as Consulting Physician (Infectious Diseases) Abeni Finchum, Chinita, MD as Consulting Physician (Hematology and Oncology)  I connected with the patient via telephone conference and verified that I am speaking with the correct person using two identifiers. The patient's location is at home and I am providing care from the [facility].  I discussed the limitations, risks, security and privacy concerns of performing an evaluation and management service by e-visits and the availability of in person appointments. I also discussed with the patient that there may be a patient responsible charge related to this service. The patient expressed understanding and agreed to proceed.   ASSESSMENT & PLAN:   Jordan Calderon is a 63 y.o. gentleman with past medical history of HIV disease, depression, hypothyroidism, recent syncopal episode in November 2024 for which he was hospitalized from 05/11/2023 until 05/12/2023 with grossly negative TTE and CT head workup, was admitted to the hospital again on 05/17/2023 after he presented with dizziness, headaches. CT angiogram of the head and neck on 05/17/2023 showed large right nasopharyngeal skull base mass which was suboptimally evaluated but was worrisome for nasopharyngeal carcinoma. He was admitted for further evaluation and management. Eventual workup revealed large B-cell lymphoma, high-grade, with multiple liver lesions and biopsies from stomach positive for non-Hodgkin lymphoma. Stage IV disease. FISH testing positive for BCL6 and negative for Bcl-2 and C-MYC.  Treated with R-CHOP x 6 cycles.  Deauville score of 1 after 3 cycles and after completion of 6 cycles.  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 FISH testing positive for BCL6 and negative for Bcl-2 and C-MYC.  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 Associated cervical, upper abdominal, and mesenteric nodal implants. Osseous involvement in the left sacrum/pelvis.   -Previously I discussed PET/CT findings with the patient, his sister and also showed them the images.  This will serve as a good baseline.  -As patient was complaining of recent onset headaches and tremors, we obtained CSF analysis to make sure there is no lymphoma involvement.  Underwent LP on 06/08/2023.  Cytology from CSF fluid was negative.  Imaging studies showed no gross abnormalities intracranially.  -Hepatitis B core antibody positive and surface antigen negative, likely from past infection.  Previously I discussed his case with his infectious disease doctor Dr. Efrain.  Since patient is on Symtuza , he does not need additional treatment for this. HBV DNA PCR was negative on 06/09/2023.  -He started chemotherapy with R-CHOP on 06/09/2023.  He has been tolerating chemotherapy well.  - After completion of 3 cycles of R-CHOP, restaging PET/CT on 08/02/2023 showed complete response to therapy, Deauville 1.  This was consistent with excellent response and plan made to continue R-CHOP for total of 6 cycles.  Completed 6 cycles as of 09/22/2023.   Restaging PET scan on 10/23/2023 showed complete response to therapy, Deauville 1.  Discussed results with the patient and his sister  previously and plan made to continue surveillance as per NCCN guidelines.  Patient developed new onset headache in mid June 2025.  He presented to the ED on 12/11/2023 with this complaint.  CT  head without contrast showed no acute findings.  He later had MRI of the brain with and without contrast on 12/12/2023 in the ED which showed 1.1 cm round enhancing intraparenchymal mass centered at the posterior right temporal occipital region.  Additional irregular area of parenchymal enhancement measuring 1.4 x 1.1 cm noted at the right occipital cortex along the right transverse sinus. Localized leptomeningeal enhancement within this region (series 18, image 6). Associated T2/FLAIR signal abnormality at these locations without significant regional mass effect or midline shift. Possible additional 4 mm focus of enhancement adjacent to the cerebral aqueduct (series 16, image 29). While these findings are nonspecific, changes of possible CNS lymphoma is suspected given patient history.  Discussed results with the patient over the phone on 12/13/2023.  Patient was referred to tertiary center at [facility] for CNS recurrence of lymphoma. (To Dr.Rakhee Darren)  Started him on Decadron  4 mg p.o. twice daily to help with his symptoms given MRI findings.   I discussed the assessment and treatment plan with the patient. The patient was provided an opportunity to ask questions and all were answered. The patient agreed with the plan and demonstrated an understanding of the instructions. The patient was advised to call back or seek an in-person evaluation if the symptoms worsen or if the condition fails to improve as anticipated.    I spent 15 minutes over the phone with the patient reviewing test results, discuss management and coordination/planning of care.  Marlies Ligman, MD  [facility] - A DEPT OF [facility] [address] [place] [address] Dept: [phone] Dept Fax: [phone]   INTERVAL HISTORY:  Please see above for problem oriented charting.  The purpose of today's discussion is to explain recent lab  results and to formulate plan of care.  Discussed the use of AI scribe software for clinical note transcription with the patient, who gave verbal consent to proceed.  History of Present Illness Jordan Calderon is a 63 year old male with lymphoma who presents with persistent headaches.  He was last seen in the clinic on Nov 17, 2023, where a PET scan showed complete response to therapy, and surveillance was planned at that time.  In mid-June 2025, he developed headaches and presented to the emergency department. A CT head without contrast showed no acute findings. However, due to persistent headaches, an MRI of the brain was performed on December 12, 2023.  He continues to experience headaches with intermittent flare-ups. He is taking Tylenol  for relief, which provides partial relief.   SUMMARY OF ONCOLOGIC HISTORY:   63 y.o. gentleman with past medical history of HIV disease, depression, hypothyroidism, recent syncopal episode in November 2024 for which he was hospitalized from 05/11/2023 until 05/12/2023 with grossly negative TTE and CT head workup, was admitted to the hospital again on 05/17/2023 after he presented with dizziness, headaches.  CT angiogram of the head and neck on 05/17/2023 showed large right nasopharyngeal skull base mass which was suboptimally evaluated but was worrisome for nasopharyngeal carcinoma.  He was admitted for further evaluation and management.  Eventual workup revealed large B-cell lymphoma, high-grade, with multiple liver lesions and biopsies from stomach positive for non-Hodgkin lymphoma.  Stage IV disease.   ENT evaluated him and performed biopsy of right nasopharyngeal mass on 05/19/2023.  We were consulted for any additional recommendations.  MR angiogram of the head without IV contrast on 05/11/2023 showed 3 mm outpouching extending posteriorly and laterally from the left ICA terminus, potentially reflecting a vascular infundibulum versus aneurysm.  Otherwise  normal intracranial MRA.   CT soft tissue of the neck on 05/18/2023 showed large right nasopharyngeal mass that extended inferiorly to the level of the greater horn of the hyoid bone on the right.  This mass involves right-sided adenoid and palatine tonsils, right parapharyngeal space, right masticator space, right parotid space.  Superiorly this most extended to the level of foreman ovale bilaterally.  A 9 mm hypodense lesion in the left parotid gland was noted, unclear if necrotic lymph node versus intraparotid cyst.   On 05/19/2023, ENT performed excision of nasopharyngeal mass.  Pathology showed respiratory mucosa with chronic sinusitis and prominent secondary follicle/germinal center formation consistent with the presence of tonsillar type tissue.  Negative for malignancy.   On 05/20/2023, CT scan of the chest showed multiple liver lesions, concerning for metastatic disease, at least 15 lesions.  It also showed upper abdominal abnormal lymph nodes.   On 05/23/2023, CT abdomen and pelvis showed similar findings in the liver and gastrohepatic lymph nodes.  Focal wall thickening of the mid gastric lumen with also greater at the lesser curvature of stomach.  Left lung base lung nodule measuring 6 mm.   On 05/22/2023, GI performed upper endoscopy for further evaluation of anemia.  Esophageal mucosal changes classified as Barrett's stage C2-M3.  LA grade D esophagitis.  Esophageal plaques were found, consistent with candidiasis.  Nonbleeding gastric ulcer which was biopsied.  Pathology from stomach ulcer biopsy came back positive for non-Hodgkin's lymphoma, large cell type.  Esophageal biopsy showed Barrett's esophagus.   On 05/23/2023, he underwent core needle biopsy of liver lesions.  Pathology showed high-grade B-cell lymphoma, large cell type.  Ki-67 was more than 50%. FISH testing positive for BCL6 and negative for Bcl-2 and C-MYC.   Staging PET/CT performed on 06/05/2023: 5.4 cm right nasopharyngeal  mass, likely corresponding to the patient's known lymphoma.Multifocal pulmonary lymphoma.  Innumerable hepatic metastases. Associated cervical, upper abdominal, and mesenteric nodal implants. Osseous involvement in the left sacrum/pelvis.   Started chemotherapy with R-CHOP from 06/09/2023.   After completion of 3 cycles of R-CHOP, restaging PET/CT on 08/02/2023 showed complete response to therapy, Deauville 1.  This was consistent with excellent response and plan made to continue R-CHOP for total of 6 cycles.   Completed 6 cycles as of 09/22/2023. Restaging PET scan on 10/23/2023 showed complete response to therapy, Deauville 1.    Plan was to continue surveillance as per NCCN guidelines.   Patient developed new onset headache in mid June 2025.  He presented to the ED on 12/11/2023 with this complaint.  CT head without contrast showed no acute findings.  He later had MRI of the brain with and without contrast on 12/12/2023 in the ED which showed 1.1 cm round enhancing intraparenchymal mass centered at the posterior right temporal occipital region.  Additional irregular area of parenchymal enhancement measuring 1.4 x 1.1 cm noted at the right occipital cortex along the right transverse sinus. Localized leptomeningeal enhancement within this region (series 18, image 6). Associated T2/FLAIR signal abnormality at these locations without significant regional mass effect or midline shift. Possible additional 4 mm focus of enhancement adjacent to the cerebral aqueduct (series 16, image 29). While these findings are nonspecific, changes of possible CNS lymphoma is suspected given patient history.  Discussed results with the patient over the  phone on 12/13/2023.  Patient was referred to tertiary center at [facility] for CNS recurrence of lymphoma.  Oncology History  High grade B-cell lymphoma (HCC)  05/31/2023 Initial Diagnosis   DLBCL (diffuse large B cell lymphoma) (HCC)   06/05/2023 Cancer  Staging   Staging form: Hodgkin and Non-Hodgkin Lymphoma, AJCC 8th Edition - Clinical: Stage IV (Diffuse large B-cell lymphoma) - Signed by Autumn Millman, MD on 06/05/2023 Stage prefix: Initial diagnosis   06/09/2023 -  Chemotherapy   Patient is on Treatment Plan : NON-HODGKINS LYMPHOMA R-CHOP q21d       REVIEW OF SYSTEMS:    Review of Systems - Oncology  All other pertinent systems were reviewed with the patient and are negative.  I have reviewed the past medical history, past surgical history, social history and family history with the patient and they are unchanged from previous note.  ALLERGIES:  He is allergic to sulfonamide derivatives.  MEDICATIONS:  Current Outpatient Medications  Medication Sig Dispense Refill   atorvastatin (LIPITOR) 20 MG tablet Take 1 tablet (20 mg total) by mouth daily. 30 tablet 11   bictegravir-emtricitabine -tenofovir  AF (BIKTARVY ) 50-200-25 MG TABS tablet Take 1 tablet by mouth daily. 30 tablet 11   dapsone  100 MG tablet Take 1 tablet (100 mg total) by mouth daily. 30 tablet 5   dexamethasone  (DECADRON ) 4 MG tablet Take 1 tablet (4 mg total) by mouth 2 (two) times daily with a meal. 30 tablet 1   diphenoxylate -atropine  (LOMOTIL ) 2.5-0.025 MG tablet Take 1 tablet by mouth 4 (four) times daily as needed for diarrhea or loose stools. (Patient not taking: Reported on 12/06/2023) 40 tablet 1   lidocaine -prilocaine  (EMLA ) cream Apply to affected area once 30 g 3   LORazepam  (ATIVAN ) 1 MG tablet Take 1 tablet (1 mg total) by mouth 2 (two) times daily as needed for anxiety. 60 tablet 0   pantoprazole  (PROTONIX ) 40 MG tablet Take 1 tablet (40 mg total) by mouth 2 (two) times daily before a meal. 60 tablet 5   No current facility-administered medications for this visit.    PHYSICAL EXAMINATION:    Onc Performance Status - 12/14/23 1100       ECOG Perf Status   ECOG Perf Status Fully active, able to carry on all pre-disease performance without  restriction      KPS SCALE   KPS % SCORE Normal activity with effort, some s/s of disease          LABORATORY DATA:   I have reviewed the data as listed.  Recent Results (from the past 2160 hours)  CD4 count     Status: Abnormal   Collection Time: 09/22/23 10:37 AM  Result Value Ref Range   CD4 T Cell Abs <35 (L) 400 - 1,790 /uL   CD4 % Helper T Cell 5 (L) 33 - 65 %    Comment: Performed at [facility], [address], [place], [place] [address]  CMP (Cancer Center only)     Status: Abnormal   Collection Time: 09/22/23 10:37 AM  Result Value Ref Range   Sodium 139 135 - 145 mmol/L   Potassium 3.6 3.5 - 5.1 mmol/L   Chloride 108 98 - 111 mmol/L   CO2 24 22 - 32 mmol/L   Glucose, Bld 110 (H) 70 - 99 mg/dL    Comment: Glucose reference range applies only to samples taken after fasting for at least 8 hours.   BUN 14 8 - 23  mg/dL   Creatinine 8.83 9.38 - 1.24 mg/dL   Calcium  9.0 8.9 - 10.3 mg/dL   Total Protein 7.2 6.5 - 8.1 g/dL   Albumin 4.3 3.5 - 5.0 g/dL   AST 31 15 - 41 U/L   ALT 33 0 - 44 U/L   Alkaline Phosphatase 92 38 - 126 U/L   Total Bilirubin 0.5 0.0 - 1.2 mg/dL   GFR, Estimated >39 >39 mL/min    Comment: (NOTE) Calculated using the CKD-EPI Creatinine Equation (2021)    Anion gap 7 5 - 15    Comment: Performed at [facility], [address] [place], [place] [address]  CBC with Differential (Cancer Center Only)     Status: Abnormal   Collection Time: 09/22/23 10:37 AM  Result Value Ref Range   WBC Count 7.2 4.0 - 10.5 K/uL   RBC 3.10 (L) 4.22 - 5.81 MIL/uL   Hemoglobin 9.8 (L) 13.0 - 17.0 g/dL   HCT 69.9 (L) 60.9 - 47.9 %   MCV 96.8 80.0 - 100.0 fL   MCH 31.6 26.0 - 34.0 pg   MCHC 32.7 30.0 - 36.0 g/dL   RDW 84.0 (H) 88.4 - 84.4 %   Platelet Count 170 150 - 400 K/uL   nRBC 0.0 0.0 - 0.2 %   Neutrophils Relative % 87 %   Neutro Abs 6.3 1.7 - 7.7 K/uL   Lymphocytes Relative 8 %   Lymphs Abs 0.6 (L) 0.7 -  4.0 K/uL   Monocytes Relative 3 %   Monocytes Absolute 0.2 0.1 - 1.0 K/uL   Eosinophils Relative 0 %   Eosinophils Absolute 0.0 0.0 - 0.5 K/uL   Basophils Relative 1 %   Basophils Absolute 0.1 0.0 - 0.1 K/uL   Immature Granulocytes 1 %   Abs Immature Granulocytes 0.04 0.00 - 0.07 K/uL    Comment: Performed at [facility], [address] [place], [place] [address]  Glucose, capillary     Status: Abnormal   Collection Time: 10/23/23  8:31 AM  Result Value Ref Range   Glucose-Capillary 106 (H) 70 - 99 mg/dL    Comment: Glucose reference range applies only to samples taken after fasting for at least 8 hours.  CMP (Cancer Center only)     Status: Abnormal   Collection Time: 11/17/23 10:32 AM  Result Value Ref Range   Sodium 139 135 - 145 mmol/L   Potassium 3.6 3.5 - 5.1 mmol/L   Chloride 103 98 - 111 mmol/L   CO2 29 22 - 32 mmol/L   Glucose, Bld 88 70 - 99 mg/dL    Comment: Glucose reference range applies only to samples taken after fasting for at least 8 hours.   BUN 22 8 - 23 mg/dL   Creatinine 8.68 (H) 9.38 - 1.24 mg/dL   Calcium  9.4 8.9 - 10.3 mg/dL   Total Protein 7.6 6.5 - 8.1 g/dL   Albumin 4.5 3.5 - 5.0 g/dL   AST 33 15 - 41 U/L   ALT 27 0 - 44 U/L   Alkaline Phosphatase 109 38 - 126 U/L   Total Bilirubin 0.7 0.0 - 1.2 mg/dL   GFR, Estimated >39 >39 mL/min    Comment: (NOTE) Calculated using the CKD-EPI Creatinine Equation (2021)    Anion gap 7 5 - 15    Comment: Performed at [facility], [address] [place], [place] [address]  CBC with Differential (Cancer Center Only)  Status: Abnormal   Collection Time: 11/17/23 10:32 AM  Result Value Ref Range   WBC Count 3.6 (L) 4.0 - 10.5 K/uL   RBC 3.61 (L) 4.22 - 5.81 MIL/uL   Hemoglobin 11.1 (L) 13.0 - 17.0 g/dL   HCT 67.2 (L) 60.9 - 47.9 %   MCV 90.6 80.0 - 100.0 fL   MCH 30.7 26.0 - 34.0 pg   MCHC 33.9 30.0 - 36.0 g/dL   RDW 86.4 88.4 - 84.4 %   Platelet Count  116 (L) 150 - 400 K/uL   nRBC 0.0 0.0 - 0.2 %   Neutrophils Relative % 42 %   Neutro Abs 1.5 (L) 1.7 - 7.7 K/uL   Lymphocytes Relative 33 %   Lymphs Abs 1.2 0.7 - 4.0 K/uL   Monocytes Relative 17 %   Monocytes Absolute 0.6 0.1 - 1.0 K/uL   Eosinophils Relative 6 %   Eosinophils Absolute 0.2 0.0 - 0.5 K/uL   Basophils Relative 2 %   Basophils Absolute 0.1 0.0 - 0.1 K/uL   Immature Granulocytes 0 %   Abs Immature Granulocytes 0.01 0.00 - 0.07 K/uL    Comment: Performed at [facility], [address] [place], [place] [address]  T-helper cells (CD4) count (not at [facility])     Status: Abnormal   Collection Time: 12/06/23 11:35 AM  Result Value Ref Range   CD4 T Helper % 6 (L) 30 - 61 %   Absolute CD4 71 (L) 490 - 1,740 cells/uL   Total lymphocyte count 1,120 850 - 3,900 cells/uL    Comment: The analytical performance characteristics of this assay  have been determined by [employer]. The  modifications have not been cleared or approved by the  FDA. This assay has been validated pursuant to the CLIA  regulations and is used for clinical purposes.   HIV-1 RNA quant-no reflex-bld     Status: None   Collection Time: 12/06/23 11:35 AM  Result Value Ref Range   HIV 1 RNA Quant NOT DETECTED NOT DETECTED copies/mL   HIV-1 RNA Quant, Log NOT DETECTED NOT DETECTED Log copies/mL    Comment: . This test was performed using Real-Time Polymerase Chain Reaction. . Reportable Range: 20 copies/mL to 10,000,000 copies/mL (1.30 log copies/mL to 7.00 log copies/mL).   RPR     Status: Abnormal   Collection Time: 12/06/23 11:35 AM  Result Value Ref Range   RPR Ser Ql REACTIVE (A) NON-REACTIVE  COMPLETE METABOLIC PANEL WITHOUT GFR     Status: None   Collection Time: 12/06/23 11:35 AM  Result Value Ref Range   Glucose, Bld 84 65 - 99 mg/dL    Comment: .            Fasting reference interval .    BUN 24 7 - 25 mg/dL   Creat 8.70 9.29 - 8.64 mg/dL   BUN/Creatinine  Ratio SEE NOTE: 6 - 22 (calc)    Comment:    Not Reported: BUN and Creatinine are within    reference range. .    Sodium 139 135 - 146 mmol/L   Potassium 4.2 3.5 - 5.3 mmol/L   Chloride 106 98 - 110 mmol/L   CO2 24 20 - 32 mmol/L   Calcium  9.4 8.6 - 10.3 mg/dL   Total Protein 7.9 6.1 - 8.1 g/dL   Albumin 5.0 3.6 - 5.1 g/dL   Globulin 2.9 1.9 - 3.7 g/dL (calc)   AG Ratio 1.7 1.0 - 2.5 (  calc)   Total Bilirubin 0.6 0.2 - 1.2 mg/dL   Alkaline phosphatase (APISO) 118 35 - 144 U/L   AST 33 10 - 35 U/L   ALT 35 9 - 46 U/L  CBC with Differential/Platelet     Status: Abnormal   Collection Time: 12/06/23 11:35 AM  Result Value Ref Range   WBC 3.0 (L) 3.8 - 10.8 Thousand/uL   RBC 4.00 (L) 4.20 - 5.80 Million/uL   Hemoglobin 12.0 (L) 13.2 - 17.1 g/dL   HCT 62.1 (L) 61.4 - 49.9 %   MCV 94.5 80.0 - 100.0 fL   MCH 30.0 27.0 - 33.0 pg   MCHC 31.7 (L) 32.0 - 36.0 g/dL    Comment: For adults, a slight decrease in the calculated MCHC value (in the range of 30 to 32 g/dL) is most likely not clinically significant; however, it should be interpreted with caution in correlation with other red cell parameters and the patient's clinical condition.    RDW 12.5 11.0 - 15.0 %   Platelets 143 140 - 400 Thousand/uL   MPV 10.2 7.5 - 12.5 fL   Neutro Abs 1,389 (L) 1,500 - 7,800 cells/uL   Absolute Lymphocytes 978 850 - 3,900 cells/uL   Absolute Monocytes 465 200 - 950 cells/uL   Eosinophils Absolute 129 15 - 500 cells/uL   Basophils Absolute 39 0 - 200 cells/uL   Neutrophils Relative % 46.3 %   Total Lymphocyte 32.6 %   Monocytes Relative 15.5 %   Eosinophils Relative 4.3 %   Basophils Relative 1.3 %  Direct LDL     Status: Abnormal   Collection Time: 12/06/23 11:35 AM  Result Value Ref Range   Direct LDL 101 (H) <100 mg/dL    Comment: Greatly elevated Triglycerides values (>1200 mg/dL) interfere with the dLDL assay. SABRA Desirable range <100 mg/dL for primary prevention;   <70 mg/dL for  patients with CHD or diabetic patients  with > or = 2 CHD risk factors. .   Rpr titer     Status: Abnormal   Collection Time: 12/06/23 11:35 AM  Result Value Ref Range   RPR Titer 1:4 (H)   T PALLIDUM AB     Status: Abnormal   Collection Time: 12/06/23 11:35 AM  Result Value Ref Range   T Pallidum Abs POSITIVE (A) NEGATIVE    Comment: . Nontreponemal and treponemal antibodies detected. Consistent with past or current syphilis. Clinical evaluation should be performed to identify signs, symptoms, or history of past infection or treatment.   Sedimentation rate     Status: Abnormal   Collection Time: 12/11/23  5:40 PM  Result Value Ref Range   Sed Rate 35 (H) 0 - 16 mm/hr    Comment: Performed at [facility], [address] [place], [place] [address]  C-reactive protein     Status: None   Collection Time: 12/11/23  5:40 PM  Result Value Ref Range   CRP 0.7 <1.0 mg/dL    Comment: Performed at [facility], [address], [place], [place] [address]  CBC with Differential     Status: Abnormal   Collection Time: 12/11/23  5:40 PM  Result Value Ref Range   WBC 3.2 (L) 4.0 - 10.5 K/uL   RBC 3.66 (L) 4.22 - 5.81 MIL/uL   Hemoglobin 11.2 (L) 13.0 - 17.0 g/dL   HCT 67.1 (L) 60.9 - 47.9 %   MCV 89.6 80.0 - 100.0 fL   MCH 30.6  26.0 - 34.0 pg   MCHC 34.1 30.0 - 36.0 g/dL   RDW 86.6 88.4 - 84.4 %   Platelets 124 (L) 150 - 400 K/uL   nRBC 0.0 0.0 - 0.2 %   Neutrophils Relative % 38 %   Neutro Abs 1.2 (L) 1.7 - 7.7 K/uL   Lymphocytes Relative 38 %   Lymphs Abs 1.2 0.7 - 4.0 K/uL   Monocytes Relative 18 %   Monocytes Absolute 0.6 0.1 - 1.0 K/uL   Eosinophils Relative 4 %   Eosinophils Absolute 0.1 0.0 - 0.5 K/uL   Basophils Relative 2 %   Basophils Absolute 0.1 0.0 - 0.1 K/uL   Immature Granulocytes 0 %   Abs Immature Granulocytes 0.01 0.00 - 0.07 K/uL    Comment: Performed at [facility], [address] [place], [place] [address]  Comprehensive  metabolic panel with GFR     Status: Abnormal   Collection Time: 12/11/23  7:16 PM  Result Value Ref Range   Sodium 141 135 - 145 mmol/L   Potassium 3.6 3.5 - 5.1 mmol/L   Chloride 103 98 - 111 mmol/L   CO2 22 22 - 32 mmol/L   Glucose, Bld 91 70 - 99 mg/dL    Comment: Glucose reference range applies only to samples taken after fasting for at least 8 hours.   BUN 21 8 - 23 mg/dL   Creatinine, Ser 8.78 0.61 - 1.24 mg/dL   Calcium  9.2 8.9 - 10.3 mg/dL   Total Protein 7.4 6.5 - 8.1 g/dL   Albumin 4.7 3.5 - 5.0 g/dL   AST 35 15 - 41 U/L   ALT 37 0 - 44 U/L   Alkaline Phosphatase 134 (H) 38 - 126 U/L   Total Bilirubin 0.5 0.0 - 1.2 mg/dL   GFR, Estimated >39 >39 mL/min    Comment: (NOTE) Calculated using the CKD-EPI Creatinine Equation (2021)    Anion gap 16 (H) 5 - 15    Comment: Performed at [facility], [address] [place], [place] [address]     RADIOGRAPHIC STUDIES:  I have personally reviewed the radiological images as listed and agree with the findings in the report.  MR Brain W and Wo Contrast Result Date: 12/12/2023 CLINICAL DATA:  Initial evaluation for acute headache. EXAM: MRI HEAD WITHOUT AND WITH CONTRAST TECHNIQUE: Multiplanar, multiecho pulse sequences of the brain and surrounding structures were obtained without and with intravenous contrast. CONTRAST:  7mL GADAVIST GADOBUTROL 1 MMOL/ML IV SOLN COMPARISON:  Prior CT from 12/11/2023 as well as earlier studies. FINDINGS: Brain: Cerebral volume within normal limits. Patchy T2/FLAIR hyperintensity involving the periventricular and deep white matter, consistent with chronic small vessel ischemic disease, mild in nature. No evidence for acute or subacute infarct. No areas of chronic cortical infarction. No acute or chronic intracranial blood products. 1.1 cm round enhancing intraparenchymal mass seen centered at the posterior right temporal occipital region (series 16, image 25). Additional irregular area of  parenchymal enhancement measuring 1.4 x 1.1 cm noted at the right occipital cortex along the right transverse sinus (series 16, image 24). Localized leptomeningeal enhancement within this region (series 18, image 6). Associated T2/FLAIR signal abnormality at these locations without significant regional mass effect or midline shift. Possible additional 4 mm focus of enhancement adjacent to the cerebral aqueduct (series 16, image 29). While these findings are nonspecific, changes of possible CNS lymphoma is suspected given patient history. No other mass lesion, mass effect  or midline shift. No hydrocephalus or extra-axial fluid collection. Pituitary gland within normal limits. No other abnormal enhancement. Vascular: Major intracranial vascular flow voids are maintained. Skull and upper cervical spine: Craniocervical junction within normal limits. Bone marrow signal intensity within normal limits. No scalp soft tissue abnormality. Sinuses/Orbits: Globes orbital soft tissues within normal limits. Paranasal sinuses are largely clear. No significant mastoid effusion. Other: 2 cm ovoid T2 hyperintense mass noted within the left parotid gland (series 15, image 17), indeterminate. IMPRESSION: 1. Abnormal parenchymal and leptomeningeal enhancement involving the right temporoccipital region as above. Possible additional 4 mm nodular focus of enhancement adjacent to the cerebral aqueduct. While these findings are nonspecific, sequelae of CNS lymphoma would be the primary differential consideration given patient history. 2. 2 cm ovoid T2 hyperintense mass within the left parotid gland, indeterminate, but not definitely seen on recent PET-CT from 10/23/2023, and could reflect additional changes of recurrent lymphoma as well. 3. No other acute intracranial abnormality. Electronically Signed   By: Morene Hoard M.D.   On: 12/12/2023 02:21   CT Head Wo Contrast Result Date: 12/11/2023 CLINICAL DATA:  History of large  B-cell lymphoma, high-grade, positive for non Hodgkin's lymphoma with Mets presenting with worsening headache over 2 weeks. EXAM: CT HEAD WITHOUT CONTRAST TECHNIQUE: Contiguous axial images were obtained from the base of the skull through the vertex without intravenous contrast. RADIATION DOSE REDUCTION: This exam was performed according to the departmental dose-optimization program which includes automated exposure control, adjustment of the mA and/or kV according to patient size and/or use of iterative reconstruction technique. COMPARISON:  June 12, 2023 FINDINGS: Brain: No evidence of acute infarction, hemorrhage, hydrocephalus, extra-axial collection or mass lesion/mass effect. Vascular: No hyperdense vessel or unexpected calcification. Skull: Normal. Negative for fracture or focal lesion. Sinuses/Orbits: No acute finding. Other: None. IMPRESSION: 1. No acute intracranial pathology. 2. MRI correlation is recommended if clinical symptoms persist. Electronically Signed   By: Suzen Dials M.D.   On: 12/11/2023 19:14     No orders of the defined types were placed in this encounter.    Future Appointments  Date Time Provider Department Center  01/18/2024  9:20 AM Armbruster, Elspeth SQUIBB, MD [facility]  02/14/2024 10:45 AM [facility] None  02/14/2024 11:15 AM Jaklyn Alen, Chinita, MD [facility] None  03/04/2024  9:30 AM Fleeta Rothman, Jomarie SAILOR, MD [facility]    This document was completed utilizing speech recognition software. Grammatical errors, random word insertions, pronoun errors, and incomplete sentences are an occasional consequence of this system due to software limitations, ambient noise, and hardware issues. Any formal questions or concerns about the content, text or information contained within the body of this dictation should be directly addressed to the provider for clarification.

## 2023-12-14 ENCOUNTER — Encounter: Payer: Self-pay | Admitting: Oncology

## 2023-12-14 ENCOUNTER — Other Ambulatory Visit: Payer: Self-pay | Admitting: *Deleted

## 2023-12-14 NOTE — Addendum Note (Signed)
 Addended by: Quinetta Shilling M on: 12/14/2023 04:27 PM   Modules accepted: Orders

## 2023-12-14 NOTE — Assessment & Plan Note (Signed)
 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 FISH testing positive for BCL6 and negative for Bcl-2 and C-MYC.  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 Associated cervical, upper abdominal, and mesenteric nodal implants. Osseous involvement in the left sacrum/pelvis.   -Previously I discussed PET/CT findings with the patient, his sister and also showed them the images.  This will serve as a good baseline.  -As patient was complaining of recent onset headaches and tremors, we obtained CSF analysis to make sure there is no lymphoma involvement.  Underwent LP on 06/08/2023.  Cytology from CSF fluid was negative.  Imaging studies showed no gross abnormalities intracranially.  -Hepatitis B core antibody positive and surface antigen negative, likely from past infection.  Previously I discussed his case with his infectious disease doctor Dr. Efrain.  Since patient is on Symtuza , he does not need additional treatment for this. HBV DNA PCR was negative on 06/09/2023.  -He started chemotherapy with R-CHOP on 06/09/2023.  He has been tolerating chemotherapy well.  - After completion of 3 cycles of R-CHOP, restaging PET/CT on 08/02/2023 showed complete response to therapy, Deauville 1.  This was consistent with excellent response and plan made to continue R-CHOP for total of 6 cycles.  Completed 6 cycles as of 09/22/2023.   Restaging PET scan on 10/23/2023 showed complete response to therapy, Deauville 1.  Discussed results with the patient and his sister  previously and plan made to continue surveillance as per NCCN guidelines.  Patient developed new onset headache in mid June 2025.  He presented to the ED on 12/11/2023 with this complaint.  CT head  without contrast showed no acute findings.  He later had MRI of the brain with and without contrast on 12/12/2023 in the ED which showed 1.1 cm round enhancing intraparenchymal mass centered at the posterior right temporal occipital region.  Additional irregular area of parenchymal enhancement measuring 1.4 x 1.1 cm noted at the right occipital cortex along the right transverse sinus. Localized leptomeningeal enhancement within this region (series 18, image 6). Associated T2/FLAIR signal abnormality at these locations without significant regional mass effect or midline shift. Possible additional 4 mm focus of enhancement adjacent to the cerebral aqueduct (series 16, image 29). While these findings are nonspecific, changes of possible CNS lymphoma is suspected given patient history.  Discussed results with the patient over the phone on 12/13/2023.  Patient was referred to tertiary center at [facility] for CNS recurrence of lymphoma. (To Dr.Rakhee Darren)  Started him on Decadron  4 mg p.o. twice daily to help with his symptoms given MRI findings.

## 2023-12-15 ENCOUNTER — Other Ambulatory Visit

## 2023-12-15 ENCOUNTER — Other Ambulatory Visit: Payer: Self-pay

## 2023-12-15 DIAGNOSIS — Z113 Encounter for screening for infections with a predominantly sexual mode of transmission: Secondary | ICD-10-CM

## 2023-12-15 NOTE — Telephone Encounter (Signed)
Patient scheduled for labs

## 2023-12-17 LAB — RPR: RPR Ser Ql: REACTIVE — AB

## 2023-12-17 LAB — T PALLIDUM AB: T Pallidum Abs: POSITIVE — AB

## 2023-12-17 LAB — RPR TITER: RPR Titer: 1:8 {titer} — ABNORMAL HIGH

## 2023-12-18 ENCOUNTER — Ambulatory Visit: Payer: Self-pay

## 2023-12-19 NOTE — Progress Notes (Signed)
 Per Dr. Fleeta Rothman ok to proceed with 2.4  MU Bicillin  x 1. Patient and patients friend [place] informed

## 2023-12-20 ENCOUNTER — Telehealth: Payer: Self-pay

## 2023-12-20 ENCOUNTER — Ambulatory Visit

## 2023-12-20 ENCOUNTER — Other Ambulatory Visit: Payer: Self-pay

## 2023-12-20 NOTE — Telephone Encounter (Signed)
 Patient friend left voicemail requesting to cancel appt for nurse visit. States they are on the way to [facility] ED. Appt canceled. Will send mychart message. Lorenda CHRISTELLA Code, RMA

## 2023-12-20 NOTE — Progress Notes (Signed)
 Records were sent to [facility] on 12/14/23 via fax for Oncology Referral per Dr. Autumn. Fax status was listed as Success.

## 2024-01-02 ENCOUNTER — Ambulatory Visit: Admitting: Gastroenterology

## 2024-01-04 NOTE — Progress Notes (Signed)
 Pulmonary Critical Care Progress Note  LOS: 15 days   Present on Admission: . Headache . Intractable headache, unspecified chronicity pattern, unspecified headache type  Principal Problem:   Headache Active Problems:   Intractable headache, unspecified chronicity pattern, unspecified headache type   Diffuse large B cell lymphoma    (CMD)   Secondary syphilis   Brain mass   HIV (human immunodeficiency virus infection)    (CMD)    Events Overnight: No acute events overnight. Doing well this morning and tolerating breakfast without any nausea/vomiting. Denies chest pain or shortness of breath this morning.  Summary: Jordan Calderon is a 63 year old man with HIV (on Biktarvy ), Stage IV large B cell lymphoma (s/p 6 cycles of R-CHOP, restaging PET with complete response), secondary syphilis, HLD, anxiety, and GERD who presented to ED on 7/2 for headache and new brain mass on outpatient imaging and admitted to BMT service.   While on BMT, ID consulted and patient underwent LP with negative infectious studies. Despite negative VDRL from CSF, patient started on empiric treatment of secondary syphilis per ID with continuous penicillin  G infusion which he completed on 7/12. Cytology and flow cytometry from CSF negative for malignancy. Neurosurgery consulted and he underwent brain biopsy on 7/9 with pathology positive for aggressive B cell lymphoma. Post-procedure, patient started on decadron . Course complicated by worsening hyponatremia. Urine studies consistent with SIADH. Patient started on salt tabs and fluid restriction. Despite this, patient developed worsening hyponatremia to 116 with associated mental status changes. He was transferred to MICU O for further management on 7/13.   Review of Systems:  A comprehensive review of systems was negative.   Physical Exam Constitutional:      General: He is not in acute distress. HENT:     Head: Normocephalic and atraumatic.     Mouth/Throat:      Mouth: Mucous membranes are moist.   Eyes:     General: No scleral icterus.   Cardiovascular:     Rate and Rhythm: Normal rate and regular rhythm.  Pulmonary:     Effort: Pulmonary effort is normal. No respiratory distress.     Breath sounds: No wheezing.  Abdominal:     General: There is no distension.     Palpations: Abdomen is soft.     Tenderness: There is no abdominal tenderness.   Musculoskeletal:        General: No swelling.     Cervical back: Neck supple.   Skin:    General: Skin is warm and dry.   Neurological:     General: No focal deficit present.     Mental Status: He is alert and oriented to person, place, and time.   Psychiatric:        Mood and Affect: Mood normal.      Assessment/Plan: System Assessment Plan  Neuro: Acute metabolic encephalopathy, multifactorial in setting of hyponatremia and steroid use, not POA, resolving   Intractable headache in setting of brain mass, POA, resolving - q4h neurochecks - treatment of hyponatremia as below - palliative care recs appreciated - continue steroid taper as below - continue scheduled robaxin and gabapentin  - continue PRN oxycodone   Pulm:      Cardio/Vascular: HLD, POA - continue home statin   GI: GERD, POA - Holding Stress ulcer ppx with home PPI due to interaction with methotrexate  Renal:      FEN: Acute hypotonic hyponatremia secondary to SIADH, not POA, resolving - Diet: regular with 1000 mL fluid restriction -  Nephrology consulted and following - decrease salt tabs 1g to once daily - off 3% hypertonic saline gtt - Trend BMP q6h - ICU electrolyte replacement protocol - Lasix 20mg  IV and monitor in/out. Goal net even to neg .  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 - restaging PET with complete response - Repeat CD4 count 29 on 7/12 - BMT following appreciate recs - Treatment of CNS lymphoma per BMT: high dose methotrexate with urine alkalinization with bicarb gtt and bicarb po - Continue steroid taper with decreasing by 4mg  every 3 days - DVT ppx with lovenox  - antimicrobial ppx with levaquin , fluconazole , and acyclovir     Skin:      Psych: Anxiety - home ativan  prn at half dose  Lines / date placed: PIVs    Communication: Contact Person: Tillman Motley, sister, [phone]  Dispo / Ethics: Code Status: Full Code     Other Data: I have independently reviewed the images as noted.  Prophylaxis: DVT: Lovenox   Activity: OOB in Chair  Disposition: Transfer to floor   Objective: Vital signs in last 24 hours: Temp:  [96.5 F (35.8 C)-98 F (36.7 C)] 97 F (36.1 C) Heart Rate:  [63-90] 63 Resp:  [8-21] 12 BP: (119-158)/(67-97) 142/97     Admission Weight: Weight: 71.4 kg (157 lb 6.5 oz) Last Weight: Weight: 73.6 kg (162 lb 3.2 oz)  Respiratory/Ventilator Data (if applicable): O2 Device: None (Room air) FiO2 (%): 21 % O2 Flow Rate (L/min): 0 L/min    Current Vascular Access: Implantable Port Right Subclavian (Active)  Access Date 01/02/24 01/02/24 1600  Access Time 1227 01/02/24 1227  Accessed by: Nels Limes RN 01/02/24 1227  Needle Length  1 inch 01/02/24 1227  Site Assessment Clean;Dry;Intact 01/03/24 2300  Line Status Blood return noted;Flushes easily;Infusing 01/03/24 2300  Line Care Flushed 01/03/24 2300  Dressing Type CHG impregnated dressing 01/03/24 2300  Dressing Status Clean;Dry;Intact 01/03/24 2300  Dressing  Intervention New dressing 01/02/24 1227  Dressing Change Due 01/09/24 01/03/24 2300  Flush Performed Yes 01/03/24 2300  Line Necessity Chemotherapy 01/03/24 1900  Line Necessity Reviewed With MICU-O 01/03/24 0700  De-Accessed Date 01/02/24 01/02/24 1200  De-Accessed Time 1200 01/02/24 1200  Number of days:      Peripheral IV 01/03/24 20 G Anterior;Right;Upper Arm (Active)  Site Assessment Clean;Dry;Intact 01/03/24 2300  Phlebitis Scale  Grade 0 01/03/24 2300  Line Status Saline locked 01/03/24 2300  PIV Infiltration Scale Grade 0 01/03/24 2300  Line Care Flushed 01/03/24 2300  Dressing Type CHG impregnated dressing 01/03/24 2300  Dressing Status Clean;Dry;Intact 01/03/24 2300  Dressing Intervention New dressing 01/03/24 1510  Dressing Change Due 01/10/24 01/03/24 2300  Reason Not Rotated Not due 01/03/24 2300  Number of days: 1   Drains:   Labs: Recent Results (from the past 24 hours)  Basic Metabolic Panel   Collection Time: 01/03/24  5:24 AM  Result Value Ref Range   Sodium 127 (L) 136 - 145 mmol/L  Potassium 3.9 3.5 - 5.1 mmol/L   Chloride 85 (L) 98 - 107 mmol/L   CO2 36 (H) 21 - 31 mmol/L   Anion Gap 6 6 - 14 mmol/L   Glucose, Random 136 (H) 70 - 99 mg/dL   Blood Urea Nitrogen (BUN) 23 7 - 25 mg/dL   Creatinine 8.95 9.29 - 1.30 mg/dL   eGFR 80 >40 fO/fpw/8.26f7   Calcium  8.2 (L) 8.6 - 10.3 mg/dL   BUN/Creatinine Ratio    POC pH, Body Fluid   Collection Time: 01/03/24  5:51 AM  Result Value Ref Range   pH, Body Fluid pH 7.0 Reference Range:  Urine pH:  5.5-6.5:  Gastric pH:  <5.0  Eye pH: = 7.0   Specimen Type urine    Kit/Device Lot #     Kit/Device Expiration Date    POC pH, Body Fluid   Collection Time: 01/03/24  8:07 AM  Result Value Ref Range   pH, Body Fluid pH 8.0 Reference Range:  Urine pH:  5.5-6.5:  Gastric pH:  <5.0  Eye pH: = 7.0   Specimen Type urine    Kit/Device Lot #     Kit/Device Expiration Date    Basic Metabolic Panel   Collection Time:  01/03/24  9:49 AM  Result Value Ref Range   Sodium 128 (L) 136 - 145 mmol/L   Potassium 3.6 3.5 - 5.1 mmol/L   Chloride 84 (L) 98 - 107 mmol/L   CO2 35 (H) 21 - 31 mmol/L   Anion Gap 9 6 - 14 mmol/L   Glucose, Random 116 (H) 70 - 99 mg/dL   Blood Urea Nitrogen (BUN) 24 7 - 25 mg/dL   Creatinine 8.84 9.29 - 1.30 mg/dL   eGFR 71 >40 fO/fpw/8.26f7   Calcium  8.1 (L) 8.6 - 10.3 mg/dL   BUN/Creatinine Ratio    Osmolality   Collection Time: 01/03/24  9:49 AM  Result Value Ref Range   Osmolality 273 (L) 276 - 304 mosm/kg  Osmolality, Urine   Collection Time: 01/03/24 10:34 AM  Result Value Ref Range   Osmolality, Urine 257 mosm/kg  Basic Metabolic Panel   Collection Time: 01/03/24  2:08 PM  Result Value Ref Range   Sodium 132 (L) 136 - 145 mmol/L   Potassium 3.6 3.5 - 5.1 mmol/L   Chloride 85 (L) 98 - 107 mmol/L   CO2 35 (H) 21 - 31 mmol/L   Anion Gap 12 6 - 14 mmol/L   Glucose, Random 124 (H) 70 - 99 mg/dL   Blood Urea Nitrogen (BUN) 23 7 - 25 mg/dL   Creatinine 8.88 9.29 - 1.30 mg/dL   eGFR 74 >40 fO/fpw/8.26f7   Calcium  7.9 (L) 8.6 - 10.3 mg/dL   BUN/Creatinine Ratio    Basic Metabolic Panel   Collection Time: 01/03/24  5:58 PM  Result Value Ref Range   Sodium 131 (L) 136 - 145 mmol/L   Potassium 3.6 3.5 - 5.1 mmol/L   Chloride 85 (L) 98 - 107 mmol/L   CO2 38 (H) 21 - 31 mmol/L   Anion Gap 8 6 - 14 mmol/L   Glucose, Random 111 (H) 70 - 99 mg/dL   Blood Urea Nitrogen (BUN) 24 7 - 25 mg/dL   Creatinine 8.89 9.29 - 1.30 mg/dL   eGFR 75 >40 fO/fpw/8.26f7   Calcium  7.9 (L) 8.6 - 10.3 mg/dL   BUN/Creatinine Ratio    Phosphorus   Collection Time: 01/03/24 11:42 PM  Result Value Ref Range  Phosphorus 3.3 2.5 - 5.0 mg/dL  Magnesium   Collection Time: 01/03/24 11:42 PM  Result Value Ref Range   Magnesium 2.1 1.9 - 2.7 mg/dL  Lactate Dehydrogenase (LDH)   Collection Time: 01/03/24 11:42 PM  Result Value Ref Range   Lactate Dehydrogenase (LDH) 174 140 - 271 U/L  Basic  Metabolic Panel   Collection Time: 01/03/24 11:42 PM  Result Value Ref Range   Sodium 131 (L) 136 - 145 mmol/L   Potassium 3.8 3.5 - 5.1 mmol/L   Chloride 87 (L) 98 - 107 mmol/L   CO2 38 (H) 21 - 31 mmol/L   Anion Gap 6 6 - 14 mmol/L   Glucose, Random 78 70 - 99 mg/dL   Blood Urea Nitrogen (BUN) 24 7 - 25 mg/dL   Creatinine 8.92 9.29 - 1.30 mg/dL   eGFR 77 >40 fO/fpw/8.26f7   Calcium  8.0 (L) 8.6 - 10.3 mg/dL   BUN/Creatinine Ratio    CBC with Differential   Collection Time: 01/03/24 11:42 PM  Result Value Ref Range   WBC 1.50 (L) 4.40 - 11.00 10*3/uL   RBC 3.36 (L) 4.50 - 5.90 10*6/uL   Hemoglobin 10.8 (L) 14.0 - 17.5 g/dL   Hematocrit 69.2 (L) 58.4 - 50.4 %   Mean Corpuscular Volume (MCV) 91.2 80.0 - 96.0 fL   Mean Corpuscular Hemoglobin (MCH) 32.1 27.5 - 33.2 pg   Mean Corpuscular Hemoglobin Conc (MCHC) 35.2 33.0 - 37.0 g/dL   Red Cell Distribution Width (RDW) 16.5 12.3 - 17.0 %   Platelet Count (PLT) 123 (L) 150 - 450 10*3/uL   Mean Platelet Volume (MPV) 8.0 6.8 - 10.2 fL   Neutrophils % 24 %   Lymphocytes % 44 %   Monocytes % 29 %   Eosinophils % 3 %   Basophils % 0 %   nRBC % 0 %   Neutrophils Absolute 0.30 (LL) 1.80 - 7.80 10*3/uL   Lymphocytes # 0.60 (L) 1.00 - 4.80 10*3/uL   Monocytes # 0.40 0.00 - 0.80 10*3/uL   Eosinophils # 0.00 0.00 - 0.50 10*3/uL   Basophils # 0.00 0.00 - 0.20 10*3/uL   nRBC Absolute 0.00 <=0.00 10*3/uL   RBC & PLT Morphology Reviewed    Ovalocytes 1+    Electronically signed by:  Camie Almarie Archer, PA-C, 01/04/2024 2:19 AM  Electronically signed by: Camie Almarie Archer, PA-C 01/04/2024 2:19 AM

## 2024-01-10 ENCOUNTER — Ambulatory Visit: Admitting: Gastroenterology

## 2024-01-18 ENCOUNTER — Ambulatory Visit: Admitting: Gastroenterology

## 2024-01-26 ENCOUNTER — Telehealth: Payer: Self-pay | Admitting: Oncology

## 2024-01-26 NOTE — Telephone Encounter (Signed)
 Jordan Calderon called to cancel his appointments for 8/27 due to pt hospitalization at [facility].

## 2024-02-05 ENCOUNTER — Inpatient Hospital Stay: Admitting: Infectious Disease

## 2024-02-07 ENCOUNTER — Ambulatory Visit: Admitting: Infectious Disease

## 2024-02-11 ENCOUNTER — Other Ambulatory Visit: Payer: Self-pay

## 2024-02-13 ENCOUNTER — Other Ambulatory Visit: Payer: Self-pay

## 2024-02-14 ENCOUNTER — Ambulatory Visit: Admitting: Oncology

## 2024-02-14 ENCOUNTER — Other Ambulatory Visit

## 2024-02-15 ENCOUNTER — Ambulatory Visit: Admitting: Oncology

## 2024-02-15 ENCOUNTER — Other Ambulatory Visit

## 2024-02-27 NOTE — ED Notes (Signed)
 Unable to obtain BP manually. Orders received for norepinephrine infusion.   Camie CHRISTELLA Rigg, RN 02/27/24 [phone]

## 2024-02-27 NOTE — ED Triage Notes (Signed)
 Pt presents to ED as a RR from hemoc clinic with c/o weakness and failure to thrive.

## 2024-02-27 NOTE — ED Provider Notes (Signed)
 ------------------------------------------------------------------------------- Attestation signed by Jordan Harold, MD at 03/02/2024  4:54 PM _____________________________________________________________________ ATTENDING SUPERVISORY NOTE I have personally seen and examined the patient, and discussed the plan of care with the resident.  I was present for the key portion of the procedure and readily available throughout .  I have reviewed the documentation of the resident and agree. Critical care time: 35 minutes, excluding other billable procedures.  Critical care was required for the patient's condition of suspected septic shock.  Critical care time includes but was not limited to data interpretation, patient reexamination, and global care coordination.  Intervention was immediately required due to the risk of substantial deterioration, and included interpretation of laboratory data, fluid resuscitation, administration of IV antibiotics, reevaluation of patient and laboratory parameters, coordination of care.   Patient's presentation is most consistent with acute presentation with potential threat to life or bodily function.   -------------------------------------------------------------------------------  Emergency Department Provider Note History and Review of Systems  Weakness - Generalized  This is a 63 y.o. male with PMH as below who presents to the emergency department with a chief complaint of AMS brought emergently from Triage for AMS.   Pt was prior a rapid response from Hem/Onc clinic at 1600 when patient was pale, weak, hypotensive, given 1L fluid with improvement in BP. On 4L [place] with monitor showing 97-99%.  Subjective   Medical History[1] Surgical History[2] Family History[3]  reports that he has never smoked. He has never used smokeless tobacco. He reports that he does not use drugs.       Physical Exam  Initial Vital Signs for the past 24 hrs (Last 1 readings):  BP Temp  Temp src Pulse Resp SpO2 Height Weight  02/27/24 1552 93/79 97.2 F (36.2 C) Temporal 99 18 98 % 172.7 cm (5' 8) 60.8 kg (134 lb)    General Appearance: Ill appearing, nonresponsive to painful stimuli, pallorous with cold extremities. HEENT: Normocephalic and atraumatic, moist mucuous membranes, 2mm bilateral, reactive, not opening eyes to command or voice. Respiratory: Increased pulmonary effort, deep breaths, no wheezing, rales or rhonchi noted. Cardiovascular: Tachycardic with paced rhythm without murmur, gallop or rub. 1+ femoral pulse, no radial or DP pulses noted initially. Port in right upper chest wall Gastrointestinal: No abdominal distension, rashes, masses noted. Genitourinary: Deferred. Skin: Cold and dry extremities.  Neurological: AAOx0, GCS 4, withdrawing to pain.   Medical Decision Making  Therapies: These medications and interventions were provided for the patient while in the ED. ED Medication Administration from 02/27/2024 1544 to 02/27/2024 2335       Date/Time Order Dose Route Action Action by    02/27/2024 1925 EDT lactated ringer's (bolus) bolus 1,000 mL 1,000 mL intravenous Not Given Duwaine RAMAN    02/27/2024 2026 EDT lactated ringer's (bolus) bolus 1,000 mL 0 mL intravenous Stopped Duwaine, RAMAN    02/27/2024 1917 EDT norepinephrine (LEVOPHED) infusion 16 mg/250 mL NS (64 mcg/mL) premix 10 mcg/min intravenous [place] Duwaine, S    02/27/2024 1948 EDT norepinephrine (LEVOPHED) infusion 16 mg/250 mL NS (64 mcg/mL) premix 5 mcg/min intravenous Rate/Dose Change Duwaine RAMAN    02/27/2024 1952 EDT norepinephrine (LEVOPHED) infusion 16 mg/250 mL NS (64 mcg/mL) premix 2 mcg/min intravenous Rate/Dose Change Duwaine RAMAN    02/27/2024 2207 EDT norepinephrine (LEVOPHED) infusion 16 mg/250 mL NS (64 mcg/mL) premix 4 mcg/min intravenous Rate/Dose Change Hewitt, R    02/27/2024 2212 EDT norepinephrine (LEVOPHED) infusion 16 mg/250 mL NS (64 mcg/mL) premix 8 mcg/min intravenous Rate/Dose Change  Hewitt, R  02/27/2024 2218 EDT norepinephrine (LEVOPHED) infusion 16 mg/250 mL NS (64 mcg/mL) premix 12 mcg/min intravenous Rate/Dose Change Hewitt, R    02/27/2024 2327 EDT norepinephrine (LEVOPHED) infusion 16 mg/250 mL NS (64 mcg/mL) premix 15 mcg/min intravenous Rate/Dose Change Hewitt, R    02/27/2024 1900 EDT lactated ringer's (bolus) bolus 1,824 mL 1,824 mL intravenous [place] Lucas, S    02/27/2024 2115 EDT lactated ringer's (bolus) bolus 1,824 mL 0 mL intravenous Stopped Hewitt, R    02/27/2024 1947 EDT vancomycin  (VANCOCIN ) 1.25 g in sodium chloride  0.9% 250 mL IVPB 1,250 mg intravenous [place] Lucas, S    02/27/2024 2115 EDT vancomycin  (VANCOCIN ) 1.25 g in sodium chloride  0.9% 250 mL IVPB 0 mg intravenous Stopped Hewitt, R    02/27/2024 2002 EDT piperacillin -tazobactam (ZOSYN ) 4.5 g in sodium chloride  0.9 % 100 mL IVPB 4.5 g intravenous [place] Hamm, A    02/27/2024 2037 EDT piperacillin -tazobactam (ZOSYN ) 4.5 g in sodium chloride  0.9 % 100 mL IVPB 0 g intravenous Stopped Hewitt, R    02/27/2024 2220 EDT dextrose  (D50W) 50 % injection 25 g 25 g intravenous Given Hewitt, R    02/27/2024 2247 EDT iohexoL  (OMNIPAQUE ) 350 mg iodine/mL injection (SDV) 100 mL 100 mL intravenous Given [place], J       Diagnoses Considered: The differential includes but is not limited to septic shock, sepsis without end organ dysfunction, hypovolemic shock, COVID/Flu, Hypoxia, hypothermia  Medical Decision Making:  Jordan Calderon is a 63 y.o. male who presents as per above. Upon initial evaluation, pt was emergently brought from triage for c/f AMS. Unable to obtain pulse ox, patient was placed on NRB at 15L as unable to obtain O2 sat due to hypothermia- attempted forehead probe, ear and multiple extremities without success. Patient hypothermic with cold extremities and unable to obtain BP. Pt with strong, deep inspirations and palpable femoral pulse. Placed on the monitor with HR showing 110-140s in what  appears to be a paced rhythm. Pt with port access and additional IV access was obtained and 1L IVF and Levophed started at 10 mcg/min while obtaining accurate BP. Temp sensing foley placed with initial temp 93 F, and patient wrapped in warm blankets while awaiting Bair hugger and obtaining additional access. Initial VBG with pH 7.05, pCO2 17, lactate 22 with adequate oxygenation. Started 30 cc/kg bolus fluids, started Vanc, Zosyn  and radial arterial line placed with titration of Levophed to MAP >65. Serial ABGs with improving lactate, pH and patient starting to respond to questions. Spoke with patient's friend and patient's sister about patient's critical condition and poor prognosis, and confirmed patient was full code despite poor prognosis of current condition as well as known malignancy. Given D50 for hypoglycemia.  Due to elevated lactate, and unknown cause/course of patient's hypoxia and/or hypotension, CTA ischemic study, CT chest and CT head ordered, and patient was transferred to MICU following CT scans for further management.  I have reviewed the nursing documentation for past medical history, family history, and social history and agree.  Insert arterial line  Performed by: Morene Thresa Leaks, MD Authorized by: Mabel Lemond Lawn, MD   Consent:    Consent obtained:  Emergent situation Indications:    Indications: hemodynamic monitoring and multiple ABGs   Pre-procedure details:    Skin preparation:  Povidone-iodine Sedation:    Sedation type:  None Anesthesia:    Anesthesia method:  Local infiltration   Local anesthetic:  Lidocaine  1% WITH epi Procedure details:    Location:  L  radial   Allen's test performed: no     Needle gauge:  18 G   Placement technique:  Ultrasound guided   Number of attempts:  3   Transducer: waveform confirmed   Post-procedure details:    Post-procedure:  Biopatch applied, secured with tape, sterile dressing applied and sutured   CMS:  Normal    Procedure completion:  Tolerated   Clinical Impression:   1. Septic shock    (CMD)     Disposition:   ED Disposition     ED Disposition  Admit   Condition  --   Comment  Primary Provider Group: Yes [1]  Provider Group:: [facility] MICU O Team [[id number]]  Certification: I certify that inpatient services are medically necessary for this patient for a duration of greater than two midnights. See H&P and MD Progress Notes for additional information about the patient's course of treatment.           ADMIT: Patient is thought to require admission as above. Please see inpatient provider note for additional treatment plan details. They were admitted to the inpatient service without any further acute emergent interventions in the ED.  ED Prescriptions   None     The plan for this patient was discussed with attending physician, who voiced agreement and who oversaw evaluation and treatment of this patient.   MDM generated using voice dictation software and may contain dictation errors. Please contact me for any clarification or with any questions.   Electronically signed by:  Morene Thresa Leaks, MD 02/27/2024 9:19 PM       [1] Past Medical History: Diagnosis Date  . Anxiety   . HIV (human immunodeficiency virus infection)    (CMD)   . Non Hodgkin's lymphoma    (CMD)   . Syphilis   [2] Past Surgical History: Procedure Laterality Date  . BRAIN BIOPSY Right 12/27/2023   BIOPSY NEEDLE BRAIN WITH NAVIGATION performed by Garnette Bouquet, MD at [facility] OR  . IR PORT-A-CATH PLACEMENT    [3] No family history on file.

## 2024-02-27 NOTE — Progress Notes (Signed)
 Rapid response team paged to [facility] hem/onc concerning pt who is very weak and hypotensive.  Upon rapid team arrival, pt found sitting up in chair, awake, 4L O2 via Bells in place, appears pale and weak. Per RN, pt was weak on arrival, requiring 2 people to transfer him from his wheelchair, initial BP 81/67, unable to obtain pulse ox, temp 96.6 temporal, and pt was complaining of feeling short of breath. 4L O2 applied at this time and SpO2 monitor shows 97-99%. 1 L fluid bolus given for BP; BP improved to 116/73, HR 81. Provider would like for pt to go to ER for further evaluation; pt agreed to go to ER. Pt transported to ER via wheelchair with rapid team, registered upon arrival, taken to triage waiting, report given to triage RN.

## 2024-02-27 NOTE — ED Notes (Signed)
 [facility] x2   Wells Garre Kenn[place], [place] 02/27/24 [id number]

## 2024-03-04 ENCOUNTER — Ambulatory Visit: Admitting: Infectious Disease

## 2024-03-06 ENCOUNTER — Inpatient Hospital Stay: Admitting: Infectious Disease

## 2024-03-08 ENCOUNTER — Telehealth: Payer: Self-pay

## 2024-03-08 NOTE — Telephone Encounter (Signed)
 Received voicemail from patient's sister, Tillman, stating that Spiro passed away at [facility] 2024/03/22. Confirmed via Care Everywhere, chart updated.   Rahsaan Weakland, BSN, RN

## 2024-03-12 ENCOUNTER — Inpatient Hospital Stay: Admitting: Infectious Disease

## 2024-03-20 NOTE — Unmapped External Note (Signed)
 Death Pronouncement Note  Date of Admission: 02/27/2024 Patient: Lyden Redner Age: 63 y.o. MRN: [id number]  Pronounced by:APP. I was called regarding the patient's passing. Patient was properly identified by name bracelet.  Type of death pronouncement:  Cardiopulmonary Death   Cardiopulmonary Death:  No reaction to verbal or tactile stimulation. Pupils checked: No pupillary light reflex (pupils are non-responsive to light and dilated). No spontaneous respiration/breath sounds. Breathing is absent. No heartbeat/heart sounds can be heard. No carotid pulses.   Patient pronounced dead.  Date of death: 03-30-2024 Time of death: 0157 Preliminary cause of death: acute hypoxemic and hypercapnic respiratory failure, secondary to COVID-19 pneumonia, in the setting of immunocompromised patient, POA, active   Principal Problem:   Septic shock    (CMD) Active Problems:   Diffuse large B cell lymphoma    (CMD)   HIV (human immunodeficiency virus infection)    (CMD)   CNS lymphoma   Acute respiratory failure with hypoxia    (CMD)   Cancer related pain   Physical deconditioning   COVID-19   Transaminitis   Lactic acidosis  Notified of Death The patient's family has BEEN NOTIFIED. Next of kin/family or other notified at bedside. Pricilla, Ladora, and her husband notified at bedside)  Pt's sister and [place], Tillman Motley, notified over phone.    The case has not been referred to the medical examiner..  A more detailed report (death summary) to follow.  Electronically signed by: Etta Johana Macadamia, PA-C

## 2024-03-20 NOTE — Nursing Note (Signed)
 Pursuant to Regulation 482.13(g)(3), required information recorded on an internal log and available immediately upon request.

## 2024-03-20 NOTE — Progress Notes (Signed)
    [name], Jordan Calderon #: [id number]  MRN:  [id number] PROVIDER Name:        KELLIE BRYANT ROSU NP     PHYSICIAN RESPONSE: Stage 2 pressure injury to the sacrum not present on admission but with dated excoriated wound on sacrum dating back to 01/30/2024 DOCUMENTATION CLARIFICATION:  Please render your professional opinion if you are treating and/or monitoring:  Stage 2 pressure injury to the sacrum, not present on admission Other (please specify) _____________________   Signs/Symptoms: -- RN flowsheet:  Wound 03/04/24 Pressure Injury Sacrum; stage 2  Treatment/Evaluation: Foam dressing and RN wound monitoring  Risk Factors: HIV and diffuse large B cell lymphoma   Thank you, Michaelle Pouch, RN Clinical Documentation Specialist Office: [phone] Secure Chat     The patient's Clinical Indicators include: See below  MRN: [id number]   Clarification initiated by: Pouch Michaelle on 03/06/2024 3:00 PM  Disclaimer: The purpose of this clarification is to ensure the accuracy and integrity of the documentation, and resultant codes, where this is conflicting, ambiguous or incomplete information, or clinical evidence for a higher degree of specificity or severity. In addition to clinical responses provided, providers are also presented with free-text and unable to determine response options.    Electronically signed by:  RAFAEL FLORIN ROSU NP Mar 09, 2024 2:33 PM

## 2024-03-20 NOTE — Progress Notes (Signed)
 Case Management Discharge Note        CSN: [id number] DOB: 1961-04-26 Service: Oncology Location: C805/A  Patient Class: Inpatient  DC Disposition: : Expired  Discharge DC Disposition: : Expired  Discharge Referrals Case closed, patient/family agree with disposition plan: Yes  Post Acute Placement Status: Coordination complete.   Case Management Coordination Status: Coordination Complete   Jordan VEAR Haskell, RN

## 2024-03-20 NOTE — Discharge Summary (Signed)
 Discharge Summary   PATIENT NAME:  Jordan Calderon DOB:  [birth year] MRN:  [id number]   Admission Date:   02/27/2024  6:52 PM                      Attending: Dotty KATHEE Pike, MD   Discharge Date:   03/06/2024   Chief Complaint on Admission:  Chief Complaint  Patient presents with  . Weakness - Generalized    Consultants: IP CONSULT TO NUTRITION SERVICES IP CONSULT TO PALLIATIVE CARE   ASSESSMENT: Principal Problem:   Septic shock    (CMD) Active Problems:   Diffuse large B cell lymphoma    (CMD)   HIV (human immunodeficiency virus infection)    (CMD)   CNS lymphoma   Acute respiratory failure with hypoxia    (CMD)   Cancer related pain   Physical deconditioning   COVID-19   Transaminitis   Lactic acidosis  Hospital Course and Treatment: Deonta Bomberger is a 63 year old male with past medical history significant for HIV (on Biktarvy ), secondary syphilis (s/p treatment), 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 That stay was further complicated by concern for neurosyphilis s/p empiric treatment, hyponatremia secondary to SIADH, gastroenteritis secondary to norovirus, acute hypoxic respiratory failure secondary to pneumonia requiring ICU level care, and Afib with RVR. He was discharged to [facility]. He then presented to the ED from SNF on 02/05/24 with FTT and was treated for RUL pneumonia and sacral wound. He was discharged back to SNF. He then presented to his outpatient oncologist with  concerns for worsening N/V and was seen again a few days later for lab re-draw. He was also scheduled for follow-up with his neuro-oncologist Dr. Ceasar on 9/11 for MRI. However, on his lab check, patient was found to be weak with BP 81/67 and O2 Sats 85%. RR was called and patient brought into the ED on 4L [place].     While in ED triage, patient became acutely obtunded, unable to obtain BP, temp, or O2 sat. He was placed on NRB and seen urgently. He was given 30mg /kg IVF and started on Levophed. Initial VBG with pH 7.05, pCO2 17, HCO3 5, lactate 22. Cultures were obtained and he was started on broad-spectrum vanc and Zosyn . Mentation improved with correction of hypovolemia and hypothermia with lactate down to 11.7 and pH compensated. CTA was obtained to rule out ischemia as well as CT head give known CNS involvement. He was admitted to MICU-O for further management.  Shortly after arrival to MICU-O, central and arterial lines were placed as patient remained hypotensive with increasing Levophed requirements; vasopressin and stress-dose steroids added. Patient was also found to be significantly hypoglycemic to the 30s-50s so he was started on D10 gtt which was weaned off on 9/12. Given hypoxemia, RPP was sent and returned positive for COVID-19 for which patient was started on Decadron  and remdesivir on 9/10. He remained neutropenic and was continued on broad-spectrum antibiotics as well. Given overall poor prognosis, palliative care  consulted on 9/11 to assist with symptom management and goals of care clarification as patient did not have capacity at that time. However, patient's sister (POA) was resistant to conversations regarding hospice and reported adamant wishes for patient to remain full code. BMT was consulted and discussed with patient and his sister that they have no other treatments/interventions to offer from an oncologic standpoint. On 9/12, patient developed Afib with RVR for which he was given amio  bolus and started on amio infusion; he was later able to be transitioned to PO amio on 9/13. Course was then complicated by agitation for which he was given prn Haldol, as well as development of small volume hematemesis vs hemoptysis and diarrhea. He did require blood product transfusions (pRBCs and platelets) intermittently for supportive care. Given diarrhea, cdiff (negative) and GIP were sent on 9/14 with GIP positive for norovirus. Patient was started on gentle mIVF with LR to compensate for ongoing GI losses. MRI brain was completed on 9/14 and showed mixed response to treatment with disappearance of some lesions and increased size of other lesions; no new lesions and no substantial intracranial mass effect or progressive ventricular enlargement. Unfortunately, patient clinically declined on 9/16 requiring maximum support on Optiflow. GOC discussions were held with patient's sister, Tillman, over the phone as patient had window of lucidity where he had decision making capacity and expressed wishes to be DNR and to not be intubated. He was therefore transitioned to DNR-Limited with plan for sister to come in to the hospital on 2024-03-16. Course was further complicated by ongoing AKI and hypernatremia for which FWF were increased and D5W gtt started.   On the evening of 9/17, patient no longer responsive and became increasingly agonal, acidemic, hypoxemic, and hypotensive requiring re-initiation of Levophed and vasopressin as well as ongoing maximum support on Optiflow with placement of NRB, consistent with active stage of dying. ICU team called patient's sister multiple times without answer, left voicemail. However, able to get in touch with patient's friend, Ladora, who presented to bedside.  Mr. Raylee Adamec was found to be in asystole with TOD pronounced on March 16, 2024 at 0157. Thomas was surrounded by ICU staff and his long-term friend Ladora and her husband and appeared comfortable and peaceful at the time  of his passing.    OBJECTIVE: BP 107/69   Pulse 94   Temp 97.6 F (36.4 C)   Resp 13   Ht 1.727 m (5' 8)   Wt 61 kg (134 lb 7.7 oz)   SpO2 96%   BMI 20.45 kg/m  I/O this shift: In: -  Out: 5 [Urine:5]   Laboratory Data: Lab Results (last 24 hours)     Procedure Component Value Ref Range Date/Time   Blood Gas, Arterial [[id number]]  (Abnormal) Collected: 03/06/24 1937   Lab Status: Final result Specimen: Blood from Arterial Updated: 03/06/24 1944    pH, Blood Gas 7.093* 7.350 - 7.450     pCO2, Arterial 61.5* 35.0 - 48.0 mm Hg     pO2, Arterial 119.0* 83.0 - 108.0 mm Hg     HCO3, Blood Gas 19* 21 - 28 mmol/L     Base Deficit (-) / Base Excess -11.0* -2.0 - 2.0 mmol/L     O2 Saturation, Arterial (measured) 97.2 94.0 - 98.0 %     FIO2 100 %     Source, Blood Gas Arterial    Result / Critical Value Notification --    Comment: ALL CRITICAL VALUES ASSOCIATED WITH THIS SAMPLE  WERE CALLED TO AND READ BACK BY;  Lauraine Berg RN      Notification Date/Time 03/06/2024 7:41:17 PM   Blood Gas, Venous [[id number]]  (Abnormal) Collected: 03/06/24 1844   Lab Status: Final result Specimen: Blood from Venous Updated: 03/06/24 1850    pH, Blood Gas 7.086* 7.320 - 7.420     pCO2, Venous 66.4* None Defined mmHg     pO2, Venous 47.0 None Defined mmHg     HCO3, Blood Gas 20* 21 - 28 mmol/L     Base Deficit (-) / Base Excess -9.9* -2.0 - 2.0 mmol/L     O2 Saturation, Venous (measured) 68.9* 70.0 - 80.0 %     FIO2 100 %     Source, Blood Gas Venous    Result / Critical Value Notification --    Comment: ALL CRITICAL VALUES ASSOCIATED WITH THIS SAMPLE WERE CALLED TO AND READ BACK BY;  Morna Pringle, RN      Notification Date/Time 03/06/2024 6:47:00 PM   Lactate, Whole Blood [[id number]]  (Abnormal) Collected: 03/06/24 1844   Lab Status: Final result Specimen: Blood from Venous Updated: 03/06/24 1850    Lactate 3.60* 0.50 - 2.20 mmol/L     Result / Critical Value Notification --     Comment: ALL CRITICAL VALUES ASSOCIATED WITH THIS SAMPLE WERE CALLED TO AND READ BACK BY;  Morna Pringle, RN      Notification Date/Time 03/06/2024 6:47:00 PM   Glucose, Whole Blood [[id number]]  (Abnormal) Collected: 03/06/24 1844   Lab Status: Final result Specimen: Blood from Venous Updated: 03/06/24 1850    Glucose 316* 74 - 100 mg/dL     Result / Critical Value Notification --    Comment: ALL CRITICAL VALUES ASSOCIATED WITH THIS SAMPLE WERE CALLED TO AND READ BACK BY;  Morna Pringle, RN      Notification Date/Time 03/06/2024 6:47:00 PM   POC Glucose [[id number]]  (Abnormal) Collected: 03/06/24 1739   Lab Status: Final result Specimen: Blood from Capillary Updated: 03/06/24 1740    Glucose, POC 259* 70 - 99 mg/dL    Basic Metabolic Panel [[id number]]  (Abnormal) Collected: 03/06/24 1518   Lab Status: Final result Specimen: Blood from Venous Updated: 03/06/24 1618    Sodium 154* 136 - 145 mmol/L     Potassium 4.5 3.5 - 5.1 mmol/L     Comment: NO VISIBLE HEMOLYSIS      Chloride 121* 98 - 107 mmol/L     CO2 25 21 - 31 mmol/L     Anion Gap 8 6 - 14 mmol/L     Glucose, Random 330* 70 - 99 mg/dL     Blood Urea Nitrogen (BUN) 81* 7 - 25 mg/dL     Creatinine 7.59* 9.29 - 1.30 mg/dL     eGFR 29* >40 fO/fpw/8.26f7     Comment: GFR estimated by CKD-EPI equations(NKF 2021).   Recommend confirmation of Cr-based eGFR by using Cys-based eGFR and other filtration markers (if applicable) in complex cases and clinical decision-making, as needed.      Calcium  7.4* 8.6 - 10.3 mg/dL     BUN/Creatinine Ratio 33.8* 10.0 - 20.0     Comment: Potential prerenal damage (e.g. CHF, GI Bleeding)     POC Glucose [[id number]]  (Abnormal) Collected: 03/06/24 1216   Lab Status: Final result Specimen: Blood from Capillary Updated: 03/06/24 1217    Glucose, POC 283* 70 - 99 mg/dL    Basic Metabolic Panel [[id number]]  (Abnormal) Collected: 03/06/24 0906   Lab  Status: Final result Specimen: Blood from  Venous Updated: 03/06/24 1004    Sodium 158* 136 - 145 mmol/L     Potassium 4.3 3.5 - 5.1 mmol/L     Comment: NO VISIBLE HEMOLYSIS      Chloride 124* 98 - 107 mmol/L     CO2 25 21 - 31 mmol/L     Anion Gap 9 6 - 14 mmol/L     Glucose, Random 234* 70 - 99 mg/dL     Blood Urea Nitrogen (BUN) 79* 7 - 25 mg/dL     Creatinine 7.66* 9.29 - 1.30 mg/dL     eGFR 30* >40 fO/fpw/8.26f7     Comment: GFR estimated by CKD-EPI equations(NKF 2021).   Recommend confirmation of Cr-based eGFR by using Cys-based eGFR and other filtration markers (if applicable) in complex cases and clinical decision-making, as needed.      Calcium  7.5* 8.6 - 10.3 mg/dL     BUN/Creatinine Ratio 33.9* 10.0 - 20.0     Comment: Potential prerenal damage (e.g. CHF, GI Bleeding)     POC Glucose [[id number]]  (Abnormal) Collected: 03/06/24 0516   Lab Status: Final result Specimen: Blood from Capillary Updated: 03/06/24 0517    Glucose, POC 154* 70 - 99 mg/dL    Type and screen [[id number]] Collected: 03/06/24 0141   Lab Status: Final result Specimen: Blood from Venous Updated: 03/06/24 0248    Component Type RED CELLS    Crossmatch Expiration 03-09-2024,2359    Type & Rh (ABORH) A Positive    Antibody Screen NEG   Prepare Platelets Irradiated : 1 Units [[id number]] Collected: 03/06/24 0135   Lab Status: Preliminary result Specimen: Blood Updated: 03/06/24 0251    Unit Number [id number]    Component Type PATH RED PLT 3 CONT    Blood Unit Division 00    Dispense Status ISSUED    Unit Transfusion Status OK TO TRANSFUSE    Blood Issue/Date Time [id number]    Blood Product Code Z1656C99    Unit ABO/Rh O POS    Blood Type 5100    Product Expiration Date 797490817640   CBC with Differential [[id number]]  (Abnormal) Collected: 03/05/24 2333   Lab Status: Final result Specimen: Blood from Venous Updated: 03/06/24 0133   Narrative:     The following orders were created for panel order CBC with Differential. Procedure                                Abnormality         Status                    ---------                               -----------         ------                    CBC with Differential[[id number]]       Abnormal            Final result               Please view results for these tests on the individual orders.   Basic Metabolic Panel [[id number]]  (Abnormal) Collected: 03/05/24 2333   Lab Status: Final result Specimen: Blood from Venous Updated: 03/06/24 0032    Sodium  158* 136 - 145 mmol/L     Potassium 3.8 3.5 - 5.1 mmol/L     Comment: NO VISIBLE HEMOLYSIS      Chloride 125* 98 - 107 mmol/L     CO2 27 21 - 31 mmol/L     Anion Gap 6 6 - 14 mmol/L     Glucose, Random 204* 70 - 99 mg/dL     Blood Urea Nitrogen (BUN) 75* 7 - 25 mg/dL     Creatinine 7.78* 9.29 - 1.30 mg/dL     eGFR 32* >40 fO/fpw/8.26f7     Comment: GFR estimated by CKD-EPI equations(NKF 2021).   Recommend confirmation of Cr-based eGFR by using Cys-based eGFR and other filtration markers (if applicable) in complex cases and clinical decision-making, as needed.      Calcium  7.3* 8.6 - 10.3 mg/dL     BUN/Creatinine Ratio 33.9* 10.0 - 20.0     Comment: Potential prerenal damage (e.g. CHF, GI Bleeding)     aPTT [[id number]]  (Abnormal) Collected: 03/05/24 2333   Lab Status: Final result Specimen: Blood from Venous Updated: 03/06/24 0042    Activated Partial Thromboplastin Time (aPTT) 51.1* <=30.0 seconds    D-Dimer, Quantitative [[id number]]  (Abnormal) Collected: 03/05/24 2333   Lab Status: Final result Specimen: Blood from Venous Updated: 03/06/24 0042    D-Dimer 19,780.00* 190.00 - 500.00 NG/ML FEU     Comment: FDA APPROVED CUTOFF FOR VTE (to include DVT and PE) EXCLUSION BY THIS METHOD IS <500 NG/ML FEU     Fibrinogen [[id number]]  (Abnormal) Collected: 03/05/24 2333   Lab Status: Final result Specimen: Blood from Venous Updated: 03/06/24 0042    Fibrinogen 452* 200 - 400 mg/dL    Lactate Dehydrogenase (LDH)  [[id number]]  (Abnormal) Collected: 03/05/24 2333   Lab Status: Final result Specimen: Blood from Venous Updated: 03/06/24 0032    Lactate Dehydrogenase (LDH) 943* 140 - 271 U/L    Liver Function Panel [[id number]]  (Abnormal) Collected: 03/05/24 2333   Lab Status: Final result Specimen: Blood from Venous Updated: 03/06/24 0032    Albumin 1.6* 3.5 - 5.7 g/dL     Bilirubin, Total 1.3* 0.3 - 1.0 mg/dL     Bilirubin, Direct 0.8* 0.0 - 0.2 mg/dL     Alkaline Phosphatase (ALP) 174* 34 - 104 U/L     Aspartate Aminotransferase (AST) 61* 13 - 39 U/L     Alanine Aminotransferase (ALT) 68* 7 - 52 U/L     Total Protein 3.4* 6.4 - 8.9 g/dL    Magnesium [[id number]]  (Normal) Collected: 03/05/24 2333   Lab Status: Final result Specimen: Blood from Venous Updated: 03/06/24 0032    Magnesium 2.3 1.9 - 2.7 mg/dL    Phosphorus [[id number]]  (Normal) Collected: 03/05/24 2333   Lab Status: Final result Specimen: Blood from Venous Updated: 03/06/24 0032    Phosphorus 4.6 2.5 - 5.0 mg/dL    Prothrombin Time (PT) with INR [[id number]]  (Abnormal) Collected: 03/05/24 2333   Lab Status: Final result Specimen: Blood from Venous Updated: 03/06/24 0042    Prothrombin Time (PT) 14.4* 8.9 - 12.1 seconds     International Normalized Ratio (INR) 1.3 <5.0    Uric Acid [[id number]]  (Normal) Collected: 03/05/24 2333   Lab Status: Final result Specimen: Blood from Venous Updated: 03/06/24 0032    Uric Acid 5.6 4.4 - 7.6 mg/dL    CBC with Differential [[id number]]  (Abnormal) Collected: 03/05/24 2333   Lab Status: Final result Specimen: Blood from Venous Updated:  03/06/24 0133    WBC 7.20 4.40 - 11.00 10*3/uL     RBC 2.75* 4.50 - 5.90 10*6/uL     Hemoglobin 8.5* 14.0 - 17.5 g/dL     Comment: Post Transfusion      Hematocrit 25.2* 41.5 - 50.4 %     Comment: Post Transfusion      Mean Corpuscular Volume (MCV) 91.4 80.0 - 96.0 fL     Mean Corpuscular Hemoglobin (MCH) 30.9 27.5 - 33.2 pg     Mean Corpuscular  Hemoglobin Conc (MCHC) 33.8 33.0 - 37.0 g/dL     Red Cell Distribution Width (RDW) 20.3* 12.3 - 17.0 %     Platelet Count (PLT) 13* 150 - 450 10*3/uL     Mean Platelet Volume (MPV) 8.9 6.8 - 10.2 fL     Neutrophils % 86 %     Lymphocytes % 2 %     Monocytes % 6 %     Eosinophils % 0 %     Basophils % 0 %     Bands % 2 %     Metamyelocytes % 2 %     Myelocyte % 2 %     nRBC per 100 WBCs 10* 0 - 0 /100     Neutrophil Absolute (Man Diff) 6.20 1.80 - 7.80 10*3/uL     Lymphocytes Absolute (Man Diff) 0.10* 1.00 - 4.80 10*3/uL     Monocytes Absolute (Man Diff) 0.40 0.00 - 0.80 10*3/uL     Eosinophils Absolute (Man Diff) 0.00 0.00 - 0.50 10*3/uL     Basophils Absolute (Man Diff) 0.00 0.00 - 0.20 10*3/uL     Bands Absolute (Man Diff) 0.10 10*3/uL     Metamyelocytes Absolute (Man Diff) 0.10 10*3/uL     Myelocytes Absolute (Man Diff) 0.10 10*3/uL     RBC & PLT Morphology Reviewed    Echinocytes Marca) 1+    Ovalocytes 1+    Poikilocytes 1+    Polychromasia 1+   POC Glucose [[id number]]  (Abnormal) Collected: 03/05/24 2333   Lab Status: Final result Specimen: Blood from Capillary Updated: 03/05/24 2333    Glucose, POC 177* 70 - 99 mg/dL       Results for orders placed or performed during the hospital encounter of 02/27/24 (from the past week)  Clostridioides difficile Antigen and Toxin EIA   Specimen: Stool, Per Rectum  Result Value Ref Range   Clostridium difficle Antigen/Toxin Interpretation     C. Diff. Toxin A/B EIA Negative Negative   Clostridium difficile antigens Negative Negative  Gastrointestinal Pathogen Profile, Stool, NAAT   Specimen: Stool, Per Rectum  Result Value Ref Range   Campylobacter PCR Not Detected Not Detected   Plesiomonas shigelloides PCR Not Detected Not Detected   Salmonella PCR Not Detected Not Detected   Vibrio PCR Not Detected Not Detected   Yersinia enterocolitica PCR Not Detected Not Detected   Enteroaggregative E coli PCR Not Detected Not Detected    Enteropathogenic E coli PCR Not Detected Not Detected   Enterotoxigenic E coli PCR Not Detected Not Detected   Shiga-Toxin-Producing E coli PCR Not Detected Not Detected   Shigella/Enteroinvasive E coli PCR Not Detected Not Detected   Cryptosporidium PCR Not Detected Not Detected   Cyclospora cayetanensis PCR Not Detected Not Detected   Entamoeba histolytica PCR Not Detected Not Detected   Giardia lamblia PCR Not Detected Not Detected   Adenovirus F 40/41 PCR Not Detected Not Detected   Astrovirus PCR Not Detected Not Detected  Norovirus GI/GII PCR Detected (A) Not Detected   Rotavirus A PCR Not Detected Not Detected   Sapovirus PCR Not Detected Not Detected   Imaging: CT Chest W Contrast  Final Result by Eulas Carlota Dauer, MD (09/10 1026)  CT CHEST W CONTRAST, 02/27/2024 10:48 PM    INDICATION: pna     COMPARISON: CT PE study 02/05/2024    TECHNIQUE: Multislice axial images were obtained through the chest with   administration of iodinated intravenous contrast material. Multi-planar   reformatted images were generated for additional analysis. Nongated   technique limits cardiac detail.    All CT scans at [facility] and [facility] [place]   Imaging are performed using radiation dose optimization techniques as   appropriate to a performed exam, including but not limited to one or more   of the following: automatic exposure control, adjustment of the mA and/or   kV according to patient size, use of iterative reconstruction technique.   In addition, our institution participates in a radiation dose monitoring   program to optimize patient radiation exposure.    FINDINGS:     Tubes and Lines: None.    Thoracic inlet/central airways: Visualized thyroid  gland is normal. The   patient was in expiratory phase during the scan. The central airways are   slightly collapse. No secretion. No cervical adenopathy.    Mediastinum/hila/axilla: No  mediastinal, bilateral axillary and left hilar   adenopathy. Right hilar lymph node versus calcified tissue measures   approximately 10 mm. This could be reactive. Dilated esophagus containing   fluid nearly throughout the entire course. This could be potential site of   aspiration.    Heart/vessels: Cardiac chambers are within normal limits.  No pericardial   effusion.  There is moderate calcified plaque in coronary arteries.     Right internal jugular approach Port-A-Cath with tip terminating in the   superior cavoatrial junction.    Fusiform dilatation of ascending thoracic aorta measures 4.3 x 4.4 cm. No   aortic aneurysm, dissection or penetrating aortic ulcer. Aorta is slightly   tortuous. Minimal atherosclerotic calcification seen.    Main pulmonary tree is not dilated. No incidental central pulmonary   emboli.    Lungs/pleura: Limited evaluation due to extreme motion artifact.    Right lung: Worsening consolidation involving the anterior aspects of the   right upper lobe. Improved bronchocentric/centrilobular nodules in the   right lung base with some residual. Previously seen multiple cavitary   lesions in the right lower lobe have resolved, however at least one lesion   has increased in size (106 series 4).    Left lung: Left basilar linear density likely due to scar. No focal   consolidation.    No effusion or pneumothoraces in both lungs.     Upper abdomen: Refer to same day CT abdomen pelvis.    Chest wall/MSK: Mild degenerative changes of the spine noted. No acute   osseous findings or aggressive osseous lesion.     IMPRESSION:    1.  Worsening right upper lobe consolidation is concerning for worsening   pneumonia. Recommend reevaluation until clear.    2.  Resolved previously seen multiple small cavitary lesion in the right   lower lobe, however at least one cavitary lesion has increased in size.   Attention on a follow-up exam.        CT Abdomen Pelvis  W And WO Contrast  Final Result by Ozell Purchase Dietzen, MD (09/10  50)  CT ABDOMEN PELVIS W AND WO CONTRAST, 02/27/2024 10:47 PM    INDICATION: ischemia   COMPARISON: None.    TECHNIQUE: CT images of the abdomen and pelvis were obtained prior to and   after intravenous administration of iodinated contrast. Conventional axial   reconstructions and multiplanar reformatted images were submitted for   review.     LIMITATION: The quality or scope of this examination is moderately   degraded because of patient-related (motion, artifact, positioning) or   technical factors beyond our control. Although small to medium-sized   lesions could conceivably be obscured, overall image quality is thought to   meet minimum standards of accepted community practice and may provide   potentially useful diagnostic information. If unanswered clinical   questions still remain, please contact the radiology service for   recommendations about rescheduling this patient or for selecting an   alternative imaging strategy.      FINDINGS:    . Lower Chest: Refer to same day CT chest.    . Liver: No suspicious focal findings.  . Gallbladder/Biliary: Query wall thickening and mild distention; no   radiopaque cholelithiasis or sludge identified. Hyperattenuating   intraluminal contents, favored vicarious contrast excretion.  SABRA Spleen: Unremarkable.  . Pancreas: Unremarkable.  . Adrenals: Unremarkable.  . Kidneys: Symmetrically enhancing. Query small stone within the upper   pole the right kidney, nonobstructing.    . Peritoneum/Mesenteries/Extraperitoneum: No free air. No free fluid or   loculated drainable collection. No pathologically enlarged lymph nodes.  . Gastrointestinal tract: No evidence of obstruction. Large colorectal   stool burden.    . Ureters: Unremarkable  . Bladder: Nondependent gas within the bladder is favored iatrogenic   relating to urethral catheter. Mild thickening of the  bladder.  . Reproductive System: Unremarkable.    . Vascular: Moderate to severe atheromatous disease involving the   abdominal aorta and its branches; no aneurysmal dilation or occlusion.   Irregular/ulcerated plaque adjacent to the IMA. Moderate stenosis at the   origin of the IMA. Possible DVT involving the lower extremities.  . Musculoskeletal: No acute displaced fractures. Polyarticular   degenerative changes. No aggressive focal bony lesions. Abdominal wall   soft tissues unremarkable.    IMPRESSION:  1.  Study is moderately degraded by motion.  2.  No evidence of bowel ischemia.  3.  Large colorectal stool burden, increases risk for stercoral colitis.   Consider disimpaction.  4.  Mild thickening of the urinary bladder wall, can be seen with cystitis   in the appropriate setting.  5.  There may be some features that can be seen with acute cholecystitis,   however, motion degradation limits evaluation on this study. If there is   concern for acute cholecystitis consider obtaining an ultrasound.  6.  Possible DVT involving the bilateral lower extremities. Recommend   ultrasound for further evaluation.  This finding was relayed via secure   messaging to PA Zabroske 0535 hours on 02/28/2024 by Dr. Michael Dietzen.    CT Head WO Contrast  Final Result by Lonni Lame Lack, MD [phone])  CT HEAD WITHOUT CONTRAST, 02/27/2024 10:46 PM    INDICATION: Mental status change, unknown cause     COMPARISON: CT head 02/05/2024, MR brain 01/25/2024.    TECHNIQUE: Axial CT images of the brain from skull base to vertex,   including portions of the face and sinuses, were obtained without   contrast. Supplemental 2D reformatted images were generated and reviewed  as needed.    All CT scans at [facility] and [facility]   Imaging are performed using radiation dose optimization techniques as   appropriate to a performed exam, including but not limited to one  or more   of the following: automatic exposure control, adjustment of the mA and/or   kV according to patient size, use of iterative reconstruction technique.   In addition, our institution participates in a radiation dose monitoring   program to optimize patient radiation exposure.    FINDINGS:  Calvarium/skull base: No evidence of acute fracture or destructive lesion.   Mastoids and middle ears demonstrate no substantial mucosal disease.     Paranasal sinuses: No air fluid levels.     Brain: No acute large vascular territory infarct. No mass effect. No   hydrocephalus. No acute hemorrhage. Patchy hypoattenuation in the   periventricular and subcortical white matter that is nonspecific but   typical for chronic small vessel ischemic disease. Cerebral volume loss   with ex vacuo expansion of the ventricles. Intracranial atherosclerosis.    IMPRESSION:  No acute intracranial abnormality. 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    XR Chest 2 Views  Final Result by Karlton Marilou Ferron, MD (09/09 1658)  XR CHEST 2 VIEWS, 02/27/2024 4:33 PM    INDICATION: Significant Generalized?Weakness?and age 27 or OLDER   Significant Generalized?Weakness?and age 52 or OLDER  COMPARISON: Chest radiograph 01/19/2024    FINDINGS:     Tubes and lines: Stable position of right IJ approach central venous   catheter with tip terminates in the superior cavoatrial junction.  Cardiovascular: Cardiac silhouette and pulmonary vasculature are within   normal limits.  Mediastinum: Within normal limits.  Lungs/pleura: Resolved right infrahilar patchy opacity. Lungs are clear   and well aerated.  Upper abdomen: Visualized portions are unremarkable.   Chest wall/osseous structures: No interval osseous change.      IMPRESSION:  There is no evidence of  acute cardiac or pulmonary abnormality.        Condition on Discharge: Expired Discharge Disposition: Discharged to: deceased   This discharge process has taken greater than 30 minutes  Signed Electronically:   Etta Cousin Smesko, PA-C 8:19 PM  03/06/2024  *Some images could not be shown.

## 2024-03-20 DEATH — deceased
